# Patient Record
Sex: Female | Born: 1945
Health system: Southern US, Community
[De-identification: ages and names within clinical notes are randomized; demographics above are authoritative.]

## PROBLEM LIST (undated history)

## (undated) DIAGNOSIS — Z8489 Family history of other specified conditions: Secondary | ICD-10-CM

## (undated) DIAGNOSIS — H9191 Unspecified hearing loss, right ear: Secondary | ICD-10-CM

## (undated) DIAGNOSIS — E7800 Pure hypercholesterolemia, unspecified: Secondary | ICD-10-CM

## (undated) DIAGNOSIS — I495 Sick sinus syndrome: Secondary | ICD-10-CM

## (undated) DIAGNOSIS — R51 Headache: Secondary | ICD-10-CM

## (undated) DIAGNOSIS — R001 Bradycardia, unspecified: Secondary | ICD-10-CM

## (undated) DIAGNOSIS — I480 Paroxysmal atrial fibrillation: Secondary | ICD-10-CM

## (undated) DIAGNOSIS — Z950 Presence of cardiac pacemaker: Secondary | ICD-10-CM

## (undated) DIAGNOSIS — C4491 Basal cell carcinoma of skin, unspecified: Secondary | ICD-10-CM

## (undated) DIAGNOSIS — Z8619 Personal history of other infectious and parasitic diseases: Secondary | ICD-10-CM

## (undated) DIAGNOSIS — F419 Anxiety disorder, unspecified: Secondary | ICD-10-CM

## (undated) DIAGNOSIS — I10 Essential (primary) hypertension: Secondary | ICD-10-CM

## (undated) DIAGNOSIS — H9190 Unspecified hearing loss, unspecified ear: Secondary | ICD-10-CM

## (undated) DIAGNOSIS — Z8739 Personal history of other diseases of the musculoskeletal system and connective tissue: Secondary | ICD-10-CM

## (undated) DIAGNOSIS — S8262XA Displaced fracture of lateral malleolus of left fibula, initial encounter for closed fracture: Secondary | ICD-10-CM

## (undated) DIAGNOSIS — Z9889 Other specified postprocedural states: Secondary | ICD-10-CM

## (undated) DIAGNOSIS — Z7901 Long term (current) use of anticoagulants: Secondary | ICD-10-CM

## (undated) DIAGNOSIS — E039 Hypothyroidism, unspecified: Secondary | ICD-10-CM

## (undated) DIAGNOSIS — R112 Nausea with vomiting, unspecified: Secondary | ICD-10-CM

## (undated) DIAGNOSIS — K8010 Calculus of gallbladder with chronic cholecystitis without obstruction: Secondary | ICD-10-CM

## (undated) DIAGNOSIS — I639 Cerebral infarction, unspecified: Secondary | ICD-10-CM

## (undated) DIAGNOSIS — H269 Unspecified cataract: Secondary | ICD-10-CM

## (undated) DIAGNOSIS — Z8719 Personal history of other diseases of the digestive system: Secondary | ICD-10-CM

## (undated) DIAGNOSIS — J449 Chronic obstructive pulmonary disease, unspecified: Secondary | ICD-10-CM

## (undated) DIAGNOSIS — D333 Benign neoplasm of cranial nerves: Secondary | ICD-10-CM

## (undated) DIAGNOSIS — G459 Transient cerebral ischemic attack, unspecified: Secondary | ICD-10-CM

## (undated) DIAGNOSIS — T148XXA Other injury of unspecified body region, initial encounter: Secondary | ICD-10-CM

## (undated) DIAGNOSIS — K219 Gastro-esophageal reflux disease without esophagitis: Secondary | ICD-10-CM

## (undated) HISTORY — DX: Unspecified hearing loss, unspecified ear: H91.90

## (undated) HISTORY — DX: Bradycardia, unspecified: R00.1

## (undated) HISTORY — PX: ABDOMINAL HYSTERECTOMY: SHX81

## (undated) HISTORY — DX: Unspecified cataract: H26.9

## (undated) HISTORY — DX: Sick sinus syndrome: I49.5

## (undated) HISTORY — PX: BREAST EXCISIONAL BIOPSY: SUR124

## (undated) HISTORY — DX: Paroxysmal atrial fibrillation: I48.0

## (undated) HISTORY — PX: TONSILLECTOMY: SUR1361

## (undated) HISTORY — DX: Personal history of other diseases of the musculoskeletal system and connective tissue: Z87.39

## (undated) HISTORY — PX: APPENDECTOMY: SHX54

## (undated) HISTORY — PX: BREAST CYST ASPIRATION: SHX578

## (undated) HISTORY — PX: INTRAOCULAR LENS INSERTION: SHX110

## (undated) HISTORY — PX: BREAST BIOPSY: SHX20

## (undated) HISTORY — DX: Cerebral infarction, unspecified: I63.9

## (undated) HISTORY — DX: Displaced fracture of lateral malleolus of left fibula, initial encounter for closed fracture: S82.62XA

## (undated) HISTORY — DX: Benign neoplasm of cranial nerves: D33.3

## (undated) HISTORY — DX: Calculus of gallbladder with chronic cholecystitis without obstruction: K80.10

## (undated) HISTORY — DX: Transient cerebral ischemic attack, unspecified: G45.9

## (undated) HISTORY — DX: Long term (current) use of anticoagulants: Z79.01

---

## 1997-04-07 HISTORY — PX: CRANIECTOMY FOR EXCISION OF ACOUSTIC NEUROMA: SUR324

## 1999-01-25 ENCOUNTER — Encounter: Admission: RE | Admit: 1999-01-25 | Discharge: 1999-01-25 | Payer: Self-pay | Admitting: Family Medicine

## 1999-01-25 ENCOUNTER — Encounter: Payer: Self-pay | Admitting: Family Medicine

## 1999-10-18 ENCOUNTER — Encounter: Payer: Self-pay | Admitting: Family Medicine

## 1999-10-18 ENCOUNTER — Encounter: Admission: RE | Admit: 1999-10-18 | Discharge: 1999-10-18 | Payer: Self-pay | Admitting: Family Medicine

## 1999-12-02 ENCOUNTER — Encounter: Admission: RE | Admit: 1999-12-02 | Discharge: 2000-03-01 | Payer: Self-pay | Admitting: Neurosurgery

## 1999-12-24 ENCOUNTER — Encounter: Admission: RE | Admit: 1999-12-24 | Discharge: 1999-12-24 | Payer: Self-pay | Admitting: Family Medicine

## 1999-12-24 ENCOUNTER — Encounter: Payer: Self-pay | Admitting: Family Medicine

## 2000-05-13 ENCOUNTER — Encounter: Admission: RE | Admit: 2000-05-13 | Discharge: 2000-05-19 | Payer: Self-pay | Admitting: Family Medicine

## 2001-02-12 ENCOUNTER — Encounter: Admission: RE | Admit: 2001-02-12 | Discharge: 2001-02-12 | Payer: Self-pay | Admitting: Family Medicine

## 2001-02-12 ENCOUNTER — Encounter: Payer: Self-pay | Admitting: Family Medicine

## 2001-08-08 ENCOUNTER — Emergency Department (HOSPITAL_COMMUNITY): Admission: EM | Admit: 2001-08-08 | Discharge: 2001-08-08 | Payer: Self-pay | Admitting: Emergency Medicine

## 2001-08-08 ENCOUNTER — Encounter: Payer: Self-pay | Admitting: Emergency Medicine

## 2001-08-13 ENCOUNTER — Encounter: Admission: RE | Admit: 2001-08-13 | Discharge: 2001-08-13 | Payer: Self-pay

## 2001-10-06 ENCOUNTER — Encounter: Admission: RE | Admit: 2001-10-06 | Discharge: 2001-10-06 | Payer: Self-pay | Admitting: Family Medicine

## 2001-10-06 ENCOUNTER — Encounter: Payer: Self-pay | Admitting: Family Medicine

## 2002-03-23 ENCOUNTER — Encounter: Payer: Self-pay | Admitting: Family Medicine

## 2002-03-23 ENCOUNTER — Encounter: Admission: RE | Admit: 2002-03-23 | Discharge: 2002-03-23 | Payer: Self-pay | Admitting: Family Medicine

## 2002-03-28 ENCOUNTER — Encounter: Admission: RE | Admit: 2002-03-28 | Discharge: 2002-03-28 | Payer: Self-pay | Admitting: Family Medicine

## 2002-03-28 ENCOUNTER — Encounter: Payer: Self-pay | Admitting: Family Medicine

## 2002-07-04 ENCOUNTER — Emergency Department (HOSPITAL_COMMUNITY): Admission: EM | Admit: 2002-07-04 | Discharge: 2002-07-04 | Payer: Self-pay | Admitting: Emergency Medicine

## 2002-11-17 ENCOUNTER — Other Ambulatory Visit: Admission: RE | Admit: 2002-11-17 | Discharge: 2002-11-17 | Payer: Self-pay | Admitting: Family Medicine

## 2003-06-01 ENCOUNTER — Encounter: Admission: RE | Admit: 2003-06-01 | Discharge: 2003-06-01 | Payer: Self-pay | Admitting: Family Medicine

## 2003-12-04 ENCOUNTER — Encounter: Admission: RE | Admit: 2003-12-04 | Discharge: 2003-12-04 | Payer: Self-pay | Admitting: Family Medicine

## 2004-02-07 ENCOUNTER — Emergency Department (HOSPITAL_COMMUNITY): Admission: EM | Admit: 2004-02-07 | Discharge: 2004-02-07 | Payer: Self-pay | Admitting: Emergency Medicine

## 2004-02-23 ENCOUNTER — Encounter: Admission: RE | Admit: 2004-02-23 | Discharge: 2004-02-23 | Payer: Self-pay | Admitting: Family Medicine

## 2005-01-31 ENCOUNTER — Encounter: Admission: RE | Admit: 2005-01-31 | Discharge: 2005-01-31 | Payer: Self-pay | Admitting: Family Medicine

## 2005-04-28 ENCOUNTER — Encounter: Payer: Self-pay | Admitting: Family Medicine

## 2005-05-26 ENCOUNTER — Ambulatory Visit (HOSPITAL_COMMUNITY): Admission: RE | Admit: 2005-05-26 | Discharge: 2005-05-26 | Payer: Self-pay | Admitting: Interventional Cardiology

## 2006-05-05 ENCOUNTER — Encounter: Admission: RE | Admit: 2006-05-05 | Discharge: 2006-05-05 | Payer: Self-pay | Admitting: Family Medicine

## 2006-08-27 ENCOUNTER — Encounter: Payer: Self-pay | Admitting: Family Medicine

## 2006-10-02 ENCOUNTER — Encounter: Payer: Self-pay | Admitting: Family Medicine

## 2006-10-02 ENCOUNTER — Other Ambulatory Visit: Admission: RE | Admit: 2006-10-02 | Discharge: 2006-10-02 | Payer: Self-pay | Admitting: Family Medicine

## 2006-10-02 LAB — CONVERTED CEMR LAB: Pap Smear: NORMAL

## 2006-11-13 ENCOUNTER — Encounter: Admission: RE | Admit: 2006-11-13 | Discharge: 2006-11-13 | Payer: Self-pay | Admitting: Family Medicine

## 2006-11-13 ENCOUNTER — Encounter: Payer: Self-pay | Admitting: Family Medicine

## 2007-07-05 ENCOUNTER — Ambulatory Visit (HOSPITAL_COMMUNITY): Admission: RE | Admit: 2007-07-05 | Discharge: 2007-07-05 | Payer: Self-pay | Admitting: Family Medicine

## 2007-07-27 ENCOUNTER — Ambulatory Visit: Payer: Self-pay | Admitting: Vascular Surgery

## 2007-07-27 ENCOUNTER — Encounter: Payer: Self-pay | Admitting: Family Medicine

## 2007-12-18 ENCOUNTER — Encounter: Payer: Self-pay | Admitting: Family Medicine

## 2007-12-18 ENCOUNTER — Encounter: Admission: RE | Admit: 2007-12-18 | Discharge: 2007-12-18 | Payer: Self-pay | Admitting: Family Medicine

## 2008-01-19 ENCOUNTER — Encounter: Admission: RE | Admit: 2008-01-19 | Discharge: 2008-02-24 | Payer: Self-pay | Admitting: Family Medicine

## 2008-08-02 ENCOUNTER — Encounter: Admission: RE | Admit: 2008-08-02 | Discharge: 2008-08-02 | Payer: Self-pay | Admitting: Family Medicine

## 2008-09-29 ENCOUNTER — Encounter: Payer: Self-pay | Admitting: Family Medicine

## 2008-09-29 LAB — CONVERTED CEMR LAB
BUN: 13 mg/dL
Cholesterol: 269 mg/dL
Creatinine, Ser: 1.1 mg/dL
Direct LDL: 156 mg/dL
HDL: 79 mg/dL
Sodium: 130 meq/L
TSH: 3.94 microintl units/mL
Triglycerides: 156 mg/dL
Vit D, 1,25-Dihydroxy: 49.4

## 2009-04-07 DIAGNOSIS — G459 Transient cerebral ischemic attack, unspecified: Secondary | ICD-10-CM

## 2009-04-07 HISTORY — DX: Transient cerebral ischemic attack, unspecified: G45.9

## 2009-08-08 ENCOUNTER — Ambulatory Visit (HOSPITAL_COMMUNITY): Admission: RE | Admit: 2009-08-08 | Discharge: 2009-08-08 | Payer: Self-pay | Admitting: Family Medicine

## 2009-10-23 ENCOUNTER — Encounter: Payer: Self-pay | Admitting: Family Medicine

## 2009-10-23 ENCOUNTER — Ambulatory Visit: Payer: Self-pay | Admitting: Family Medicine

## 2009-10-23 DIAGNOSIS — F411 Generalized anxiety disorder: Secondary | ICD-10-CM | POA: Insufficient documentation

## 2009-10-23 DIAGNOSIS — E7800 Pure hypercholesterolemia, unspecified: Secondary | ICD-10-CM | POA: Insufficient documentation

## 2009-10-23 DIAGNOSIS — N959 Unspecified menopausal and perimenopausal disorder: Secondary | ICD-10-CM | POA: Insufficient documentation

## 2009-10-24 LAB — CONVERTED CEMR LAB
ALT: 33 units/L (ref 0–35)
AST: 26 units/L (ref 0–37)
Albumin: 3.9 g/dL (ref 3.5–5.2)
Alkaline Phosphatase: 72 units/L (ref 39–117)
BUN: 14 mg/dL (ref 6–23)
Bilirubin, Direct: 0.2 mg/dL (ref 0.0–0.3)
CO2: 31 meq/L (ref 19–32)
Calcium: 9.3 mg/dL (ref 8.4–10.5)
Chloride: 106 meq/L (ref 96–112)
Cholesterol: 280 mg/dL — ABNORMAL HIGH (ref 0–200)
Creatinine, Ser: 1 mg/dL (ref 0.4–1.2)
Direct LDL: 177.7 mg/dL
GFR calc non Af Amer: 58.56 mL/min (ref >60)
Glucose, Bld: 88 mg/dL (ref 70–99)
HDL: 73.1 mg/dL (ref >39.00)
Potassium: 4.5 meq/L (ref 3.5–5.1)
Sodium: 140 meq/L (ref 135–145)
Total Bilirubin: 0.4 mg/dL (ref 0.3–1.2)
Total CHOL/HDL Ratio: 4
Total Protein: 6.3 g/dL (ref 6.0–8.3)
Triglycerides: 148 mg/dL (ref 0.0–149.0)
VLDL: 29.6 mg/dL (ref 0.0–40.0)

## 2009-12-03 ENCOUNTER — Telehealth: Payer: Self-pay | Admitting: Family Medicine

## 2009-12-05 ENCOUNTER — Encounter: Payer: Self-pay | Admitting: Family Medicine

## 2009-12-05 DIAGNOSIS — I10 Essential (primary) hypertension: Secondary | ICD-10-CM | POA: Insufficient documentation

## 2010-02-03 ENCOUNTER — Emergency Department (HOSPITAL_COMMUNITY): Admission: EM | Admit: 2010-02-03 | Discharge: 2010-02-03 | Payer: Self-pay | Admitting: Emergency Medicine

## 2010-02-21 ENCOUNTER — Encounter: Admission: RE | Admit: 2010-02-21 | Discharge: 2010-02-21 | Payer: Self-pay | Admitting: Neurology

## 2010-04-27 ENCOUNTER — Encounter: Payer: Self-pay | Admitting: Family Medicine

## 2010-04-28 ENCOUNTER — Encounter: Payer: Self-pay | Admitting: Family Medicine

## 2010-05-07 NOTE — Miscellaneous (Signed)
  Clinical Lists Changes  Medications: Removed medication of VITAMIN E 600 UNIT  CAPS (VITAMIN E) Takes 3 capsules once daily Added new medication of FISH OIL   OIL (FISH OIL) Takes 3 per day

## 2010-05-07 NOTE — Assessment & Plan Note (Signed)
Summary: ?TRANSFER FROM [facility]   Vital Signs:  Patient profile:   65 year old female Height:      65 inches Weight:      121.75 pounds BMI:     20.33 Temp:     97.8 degrees F oral Pulse rate:   88 / minute Pulse rhythm:   regular BP sitting:   122 / 80  (left arm) Cuff size:   regular  Vitals Entered By: Delilah Shan CMA Kadyn Chovan Dull) (October 23, 2009 8:38 AM) CC: Transfer from [place] - [facility]   History of Present Illness: Prev [facility] patient.  Prev patient of Dr. Abigail Miyamoto.    CPE- see plan below along with prev med.   H/o Menopausal symptoms with hot flashes and extreme pain with intercourse. D/w patient JX:BJYNW of HRT.  She has had sig vaginal dryness that was only relieved by HRT.  She has never tried a taper and is interested.  I have d/w patient risks/benefits of HRT.    H/o HLD and due for recheck.  Intolerant of statins.  Taking fish oil.  Working on diet and exercise.   H/o AF followed by cards, Dr. Mendel Ryder with [facility].   "If I don't eat, every few hours, my sugar drops and I get nauseated.  If I eat a big meal, I feel even worse."  No vomiting, no BRBPR.   H/o acoustic neuroma, surgery and subsequent anxiety- had been on wellbutrin in the meantime to control anxiety.  Did well on 450mg  /day.  Dec in migraine frequency with this med.  Mood stable and outlook bright.   Preventive Screening-Counseling & Management  Alcohol-Tobacco     Smoking Status: never  Allergies (verified): 1)  ! Penicillin  Past History:  Past Medical History: AF HLD- intolerant of multiple statins, chol meds H/o acoustic neuroma- Deaf on R side H/o shingles Menopausal symptoms Allergic rhinitis Anxiety Migraines H/o UTI after intercourse, takes septra two times a day for 1 day.  H/o muscle spasms that respond to flexeril  Past Surgical History: 1999 acoustic neuroma on R breast biopsy x3, all negative 1982 Hysterectomy  Tonsillectomy in childhood  Family History: Reviewed  history and no changes required. parents: alcohol, arthritis, HLD, CAD, HTN, DM2 GPs: lung CA, CAD, CVA, HLD aunt with breast CA  Social History: Married second time 8. 3 kids. [employer], reading Secondary school teacher. Very rare alcohol Never Smoked Masters degreeSmoking Status:  never  Review of Systems       See HPI.  Otherwise noncontributory.    Physical Exam  General:  GEN: nad, alert and oriented HEENT: mucous membranes moist NECK: supple w/o LA CV: rrr.  no murmur PULM: ctab, no inc wob ABD: soft, +bs EXT: no edema SKIN: no acute rash  Breasts:  No mass, nodules, thickening, tenderness, bulging, retraction, inflamation, nipple discharge or skin changes noted.  chaperoned exam   Impression & Recommendations:  Problem # 1:  Preventive Health Care (ICD-V70.0) Last mammogram in 2011, pap not indicated. Will check outside records GN:FAOZHYQ vaccine.  Pt to check on zostavax. colonosocpy done in last 5 years. check lipids and glucose today.  continue healthy diet and exercise.  I think that the patient is getting symptoms of hypoglycemia. I want her to increase her protein intake to see if this helps with the symptoms described in the HPI.  she agrees.   Problem # 2:  ANXIETY STATE, UNSPECIFIED (ICD-300.00) Controlled, continue current meds.  Her updated medication list for this problem  includes:    Wellbutrin Xl 300 Mg Xr24h-tab (Bupropion hcl) .[name]... Take 1 tablet by mouth once a day along with a 150 mg. tablet    Wellbutrin Xl 150 Mg Xr24h-tab (Bupropion hcl) .[name]... Take 1 tablet by mouth once a day along with a 300 mg. tablet.  Problem # 3:  MENOPAUSAL DISORDER (ICD-627.9) Will taper HRT as allowed by symptoms.  Pt agrees with plan.  follow up as needed.  The following medications were removed from the medication list:    Cenestin 0.9 Mg Tabs (Estrogens conj synthetic a) .[name]... Take 1 tablet by mouth once a day Her updated medication list for this problem includes:    Cenestin  0.625 Mg Tabs (Estrogens conj synthetic a) .[name]... 1 by mouth qday  Problem # 4:  HYPERCHOLESTEROLEMIA (ICD-272.0) contact with labs.  Orders: TLB-Lipid Panel (80061-LIPID) TLB-BMP (Basic Metabolic Panel-BMET) (80048-METABOL) TLB-Hepatic/Liver Function Pnl (80076-HEPATIC)  Complete Medication List: 1)  Wellbutrin Xl 300 Mg Xr24h-tab (Bupropion hcl) .... Take 1 tablet by mouth once a day along with a 150 mg. tablet 2)  Wellbutrin Xl 150 Mg Xr24h-tab (Bupropion hcl) .... Take 1 tablet by mouth once a day along with a 300 mg. tablet. 3)  Aspirin 81 Mg Tabs (Aspirin) .... Takes 2 tablets once daily 4)  Vitamin E 600 Unit Caps (Vitamin e) .... Takes 3 capsules once daily 5)  Cenestin 0.625 Mg Tabs (Estrogens conj synthetic a) .[name].. 1 by mouth qday 6)  Septra Ds 800-160 Mg Tabs (Sulfamethoxazole-trimethoprim) .[name].. 1 by mouth two times a day as needed 7)  Flexeril 10 Mg Tabs (Cyclobenzaprine hcl) .[name].. 1 by mouth three times a day as needed pain with sedation caution  Patient Instructions: 1)  Check with your insurance to see if they will cover the shingles shot.  We'll contact you with your lab report.  2)  Please schedule a follow-up appointment as needed .  Let me know if the low blood sugar symptoms continue.  Prescriptions: FLEXERIL 10 MG TABS (CYCLOBENZAPRINE HCL) 1 by mouth three times a day as needed pain with sedation caution  #30 x 3   Entered and Authorized by:   Crawford Givens MD   Signed by:   Crawford Givens MD on 10/23/2009   Method used:   Electronically to        [facility]  [address] (retail)       [address]       [place], [place]  [address]       Ph: [phone]       Fax: [phone]   RxID:   [id number] SEPTRA DS 800-160 MG TABS (SULFAMETHOXAZOLE-TRIMETHOPRIM) 1 by mouth two times a day as needed  #20 x 1   Entered and Authorized by:   Crawford Givens MD   Signed by:   Crawford Givens MD on 10/23/2009   Method used:   Electronically to        [facility]   [address] [address] (retail)       [address]       [place], [place]  [address]       Ph: [phone]       Fax: [phone]   RxID:   [id number] WELLBUTRIN XL 150 MG XR24H-TAB (BUPROPION HCL) Take 1 tablet by mouth once a day along with a 300 mg. tablet.  #30 x 12   Entered and Authorized by:  Crawford Givens MD   Signed by:   Crawford Givens MD on 10/23/2009   Method used:   Electronically to        [facility] [address] (retail)       [address]       [place], [place]  [address]       Ph: [phone]       Fax: [phone]   RxID:   [id number] WELLBUTRIN XL 300 MG XR24H-TAB (BUPROPION HCL) Take 1 tablet by mouth once a day along with a 150 mg. tablet  #30 x 12   Entered and Authorized by:   Crawford Givens MD   Signed by:   Crawford Givens MD on 10/23/2009   Method used:   Electronically to        [facility]  [address] (retail)       [address]       [place], [place]  [address]       Ph: [phone]       Fax: [phone]   RxID:   [id number] CENESTIN 0.625 MG TABS (ESTROGENS CONJ SYNTHETIC A) 1 by mouth qday  #30 x 12   Entered and Authorized by:   Crawford Givens MD   Signed by:   Crawford Givens MD on 10/23/2009   Method used:   Electronically to        [facility]  [address] (retail)       [address]       [place], [place]  [address]       Ph: [phone]       Fax: [phone]   RxID:   [id number]   Current Allergies (reviewed today): ! PENICILLIN

## 2010-05-07 NOTE — Miscellaneous (Signed)
  Clinical Lists Changes  Problems: Added new problem of HYPERTENSION (ICD-401.9) Added new problem of PALPITATIONS (ICD-785.1) Observations: Added new observation of PAST MED HX: AF HLD- intolerant of multiple statins, chol meds H/o acoustic neuroma- Deaf on R side H/o shingles Menopausal symptoms- didn't tolerate taper of HRT in 2011 Allergic rhinitis Anxiety Migraines H/o UTI after intercourse, takes septra two times a day for 1 day.  H/o muscle spasms that respond to flexeril HYPERTENSION (ICD-401.9) HYPERCHOLESTEROLEMIA (ICD-272.0) ANXIETY STATE, UNSPECIFIED (ICD-300.00) MENOPAUSAL DISORDER (ICD-627.9) PALPITATIONS (ICD-785.1) due to PAC's and PVC's Cardiolite study was negative for ischemia but had exercise-induced PVC's 04/28/2005  Gated Cardiolite Exercise Stress Test  - Normal myocardial perfusion study without evidence of ischemia.  Normal wall motion study with EF of 76% (12/05/2009 15:44) Added new observation of MAMMOGRAM: normal (08/08/2009 15:56) Added new observation of FLU VAX: Fluvax 3+ (04/13/2009 15:52) Added new observation of VIT D 1,25OH: 49.4  (09/29/2008 15:44) Added new observation of TSH: 3.94 microintl units/mL (09/29/2008 15:44) Added new observation of CREATININE: 1.10 mg/dL (82/95/6213 08:65) Added new observation of BUN: 13 mg/dL (78/46/9629 52:84) Added new observation of NA: 130 meq/L (09/29/2008 15:44) Added new observation of LDL DIR: 156 mg/dL (13/24/4010 27:25) Added new observation of HDL: 79 mg/dL (36/64/4034 74:25) Added new observation of TRIGLYC TOT: 156 mg/dL (95/63/8756 43:32) Added new observation of CHOLESTEROL: 269 mg/dL (95/18/8416 60:63) Added new observation of PAP SMEAR: normal  (10/02/2006 15:59) Added new observation of TD BOOSTER: Td  (04/07/2005 15:51)      Past History:  Past Medical History: AF HLD- intolerant of multiple statins, chol meds H/o acoustic neuroma- Deaf on R side H/o shingles Menopausal symptoms-  didn't tolerate taper of HRT in 2011 Allergic rhinitis Anxiety Migraines H/o UTI after intercourse, takes septra two times a day for 1 day.  H/o muscle spasms that respond to flexeril HYPERTENSION (ICD-401.9) HYPERCHOLESTEROLEMIA (ICD-272.0) ANXIETY STATE, UNSPECIFIED (ICD-300.00) MENOPAUSAL DISORDER (ICD-627.9) PALPITATIONS (ICD-785.1) due to PAC's and PVC's Cardiolite study was negative for ischemia but had exercise-induced PVC's 04/28/2005  Gated Cardiolite Exercise Stress Test  - Normal myocardial perfusion study without evidence of ischemia.  Normal wall motion study with EF of 76%     Preventive Care Screening  Mammogram:    Date:  08/08/2009    Results:  normal  Pap Smear:    Date:  10/02/2006    Results:  normal   Last Tetanus Booster:    Date:  04/07/2005    Results:  Td     Immunization History:  Influenza Immunization History:    Influenza:  fluvax 3+ (04/13/2009)

## 2010-05-07 NOTE — Letter (Signed)
Summary: [facility]  [facility]   Imported By: Sherian Rein 12/13/2009 09:10:48  _____________________________________________________________________  External Attachment:    Type:   Image     Comment:   External Document

## 2010-05-07 NOTE — Progress Notes (Signed)
Summary: [name]   Phone Note Call from Patient Call back at Home Phone [phone]   Caller: Patient Call For: Crawford Givens MD Summary of Call: Patient states that she was taking 0.9 of the [name] and it you had changed it to 0.625 to see if she would do okay w/ the lowe dose. She says that she really needs to 0.9  and is asking if she could [facility] [place].  Initial call taken by: Melody Comas,  December 03, 2009 2:16 PM  Follow-up for Phone Call        done.  Follow-up by: Crawford Givens MD,  December 03, 2009 4:23 PM    New/Updated Medications: [name] 0.9 MG TABS (ESTROGENS CONJ SYNTHETIC A) 1 by mouth qday Prescriptions: [name] 0.9 MG TABS (ESTROGENS CONJ SYNTHETIC A) 1 by mouth qday  #90 x 3   Entered and Authorized by:   Crawford Givens MD   Signed by:   Crawford Givens MD on 12/03/2009   Method used:   Electronically to        [facility]  [address] [address] (retail)       [address]       [place], [place]  [address]       Ph: [phone]       Fax: [phone]   RxID:   [id number]    Past History:  Past Medical History: AF HLD- intolerant of multiple statins, chol meds H/o acoustic neuroma- Deaf on R side H/o shingles Menopausal symptoms- didn't tolerate taper of HRT in 2011 Allergic rhinitis Anxiety Migraines H/o UTI after intercourse, takes septra two times a day for 1 day.  H/o muscle spasms that respond to flexeril

## 2010-06-19 LAB — DIFFERENTIAL
Basophils Absolute: 0 10*3/uL (ref 0.0–0.1)
Basophils Relative: 0 % (ref 0–1)
Eosinophils Absolute: 0.3 10*3/uL (ref 0.0–0.7)
Eosinophils Relative: 4 % (ref 0–5)
Lymphocytes Relative: 13 % (ref 12–46)
Lymphs Abs: 1.1 10*3/uL (ref 0.7–4.0)
Monocytes Absolute: 0.4 10*3/uL (ref 0.1–1.0)
Monocytes Relative: 5 % (ref 3–12)
Neutro Abs: 6.2 10*3/uL (ref 1.7–7.7)
Neutrophils Relative %: 78 % — ABNORMAL HIGH (ref 43–77)

## 2010-06-19 LAB — CBC
HCT: 43.3 % (ref 36.0–46.0)
Hemoglobin: 14.6 g/dL (ref 12.0–15.0)
MCH: 28.4 pg (ref 26.0–34.0)
MCHC: 33.8 g/dL (ref 30.0–36.0)
MCV: 84.1 fL (ref 78.0–100.0)
Platelets: 270 10*3/uL (ref 150–400)
RBC: 5.15 MIL/uL — ABNORMAL HIGH (ref 3.87–5.11)
RDW: 12.9 % (ref 11.5–15.5)
WBC: 8 10*3/uL (ref 4.0–10.5)

## 2010-06-19 LAB — URINALYSIS, ROUTINE W REFLEX MICROSCOPIC
Bilirubin Urine: NEGATIVE
Glucose, UA: NEGATIVE mg/dL
Hgb urine dipstick: NEGATIVE
Ketones, ur: NEGATIVE mg/dL
Nitrite: NEGATIVE
Protein, ur: NEGATIVE mg/dL
Specific Gravity, Urine: 1.007 (ref 1.005–1.030)
Urobilinogen, UA: 0.2 mg/dL (ref 0.0–1.0)
pH: 7.5 (ref 5.0–8.0)

## 2010-06-19 LAB — POCT I-STAT, CHEM 8
Chloride: 104 mEq/L (ref 96–112)
Creatinine, Ser: 1.1 mg/dL (ref 0.4–1.2)
Glucose, Bld: 118 mg/dL — ABNORMAL HIGH (ref 70–99)
HCT: 46 % (ref 36.0–46.0)
Potassium: 4.6 mEq/L (ref 3.5–5.1)

## 2010-06-19 LAB — POCT CARDIAC MARKERS
CKMB, poc: 2 ng/mL (ref 1.0–8.0)
Troponin i, poc: <0.05 ng/mL (ref 0.00–0.09)

## 2010-06-19 LAB — GLUCOSE, CAPILLARY: Glucose-Capillary: 116 mg/dL — ABNORMAL HIGH (ref 70–99)

## 2010-06-19 LAB — D-DIMER, QUANTITATIVE: D-Dimer, Quant: 0.3 ug/mL-FEU (ref 0.00–0.48)

## 2010-08-13 ENCOUNTER — Other Ambulatory Visit (HOSPITAL_COMMUNITY): Payer: Self-pay | Admitting: Family Medicine

## 2010-08-13 DIAGNOSIS — Z1231 Encounter for screening mammogram for malignant neoplasm of breast: Secondary | ICD-10-CM

## 2010-08-20 NOTE — Procedures (Signed)
LOWER EXTREMITY ARTERIAL EVALUATION-SINGLE LEVEL   INDICATION:  Left leg cramping at night, which is relieved with motion  of the left leg.   HISTORY:  Diabetes:  No.  Cardiac:  Atrial fibrillation.  Hypertension:  Medically controlled.  Smoking:  No.  Previous Surgery:  No.   RESTING SYSTOLIC PRESSURES: (ABI)                          RIGHT                LEFT  Brachial:               108                  106  Anterior tibial:        118                  120  Posterior tibial:       128 (>1.0)           126 (>1.0)  Peroneal:  DOPPLER WAVEFORM ANALYSIS:  Anterior tibial:        Biphasic             Biphasic  Posterior tibial:       Biphasic             Biphasic  Peroneal:   PREVIOUS ABI'S:  Date:  RIGHT:  LEFT:   IMPRESSION:  Findings suggest no significant arterial occlusive disease  bilaterally.   ___________________________________________  Quita Skye Hart Rochester, M.D.   MC/MEDQ  D:  07/27/2007  T:  07/27/2007  Job:  [id number]

## 2010-08-20 NOTE — Consult Note (Signed)
VASCULAR SURGERY CONSULTATION   [name], [name]  DOB:  1945-12-10                                       07/27/2007  JXBJY#:[id number]   The patient was referred for vascular evaluation by Dr. Abigail Miyamoto for leg  discomfort.  This 64 year old healthy female states that she has  cramping in her left calf at night, which is frequent at times and then  she will go several days without it.  This does not happened during the  daytime and is not associated with walking or ambulation.  She states  that it is more severe when her left leg is straight as opposed to  flexed.  She has no history of deep venous thrombosis, thrombophlebitis,  pulmonary emboli, ischemic ulcerations, venous stasis ulcers, or  varicose veins.   PAST MEDICAL HISTORY:  Hypertension, hyperlipidemia, negative for  diabetes, coronary artery disease, CVA, COPD.  She does have a history  atrial fibrillation.   PAST SURGICAL HISTORY:  1. Removal on an acoustic neuroma.  2. Hysterectomy.   FAMILY HISTORY:  Negative for coronary artery disease and diabetes.  Positive for stroke in a grandmother.   SOCIAL HISTORY:  She is married, has three children.  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   REVIEW OF SYSTEMS:  Unremarkable with the exception of her atrial  fibrillation and her leg cramping at night.  Otherwise being totally  free of symptomatology.  Allergies to penicillin.   MEDICATIONS:  Please see health history form.   PHYSICAL EXAM:  Blood pressure 107/73, heart rate 70, respirations 14.  General:  She is a middle-aged female in no apparent distress, alert and  oriented x3.  Neck:  Supple 3+ carotid pulses palpable.  No bruits are  audible.  Neurologic:  Normal.  No palpable adenopathy in the neck.  Chest:  Clear to auscultation.  Cardiovascular:  Regular rhythm with no  murmurs.  Is soft, nontender with no palpable masses.  3+ femoral  popliteal, dorsalis pedis and  posterior tibial pulses palpable  bilaterally.  No edema is noted.  Both feet are well-perfused.  There  are a few prominent veins in the left leg, in the medial calf and medial  thigh but no significant varicosities or spider veins.  No  hyperpigmentation or ulcerations noted.   Lower extremity arterial study was normal with normal ABIs and triphasic  waveforms in both feet.   I reassured her arterial and venous systems look quite good and that her  symptoms are unrelated to this.  Cramping may be the typical nocturnal  cramps which could respond to some quinine or potassium supplements may  be of help.  She may continue to have these regardless of the treatment.  She questioned whether her anti-lipid drug (Crestor) could be  responsible, and the only way I think that to be determined would be to  discontinue it for a period of time and see what happens her symptoms.  If I can be of further assistance, please let me know.   Quita Skye Hart Rochester, M.D.  Electronically Signed  JDL/MEDQ  D:  07/27/2007  T:  07/28/2007  Job:  [id number]   cc:   Chales Salmon. Abigail Miyamoto, M.D.

## 2010-09-11 ENCOUNTER — Ambulatory Visit (HOSPITAL_COMMUNITY): Payer: Medicare Other

## 2010-09-18 ENCOUNTER — Ambulatory Visit (HOSPITAL_COMMUNITY): Payer: Medicare Other

## 2010-10-01 ENCOUNTER — Ambulatory Visit (HOSPITAL_COMMUNITY)
Admission: RE | Admit: 2010-10-01 | Discharge: 2010-10-01 | Disposition: A | Discharge status: Home / Self Care | Payer: Medicare Other | Source: Ambulatory Visit | Attending: Family Medicine | Admitting: Family Medicine

## 2010-10-01 DIAGNOSIS — Z1231 Encounter for screening mammogram for malignant neoplasm of breast: Secondary | ICD-10-CM | POA: Insufficient documentation

## 2011-01-19 ENCOUNTER — Emergency Department (HOSPITAL_COMMUNITY): Payer: Medicare Other

## 2011-01-19 ENCOUNTER — Emergency Department (HOSPITAL_COMMUNITY)
Admission: EM | Admit: 2011-01-19 | Discharge: 2011-01-19 | Disposition: A | Discharge status: Home / Self Care | Payer: Medicare Other | Attending: Emergency Medicine | Admitting: Emergency Medicine

## 2011-01-19 DIAGNOSIS — Z86011 Personal history of benign neoplasm of the brain: Secondary | ICD-10-CM | POA: Insufficient documentation

## 2011-01-19 DIAGNOSIS — R42 Dizziness and giddiness: Secondary | ICD-10-CM | POA: Insufficient documentation

## 2011-01-19 DIAGNOSIS — Z7901 Long term (current) use of anticoagulants: Secondary | ICD-10-CM | POA: Insufficient documentation

## 2011-01-19 DIAGNOSIS — K219 Gastro-esophageal reflux disease without esophagitis: Secondary | ICD-10-CM | POA: Insufficient documentation

## 2011-01-19 DIAGNOSIS — R4182 Altered mental status, unspecified: Secondary | ICD-10-CM | POA: Insufficient documentation

## 2011-01-19 DIAGNOSIS — I10 Essential (primary) hypertension: Secondary | ICD-10-CM | POA: Insufficient documentation

## 2011-01-19 DIAGNOSIS — I4891 Unspecified atrial fibrillation: Secondary | ICD-10-CM | POA: Insufficient documentation

## 2011-01-19 DIAGNOSIS — Z79899 Other long term (current) drug therapy: Secondary | ICD-10-CM | POA: Insufficient documentation

## 2011-01-19 DIAGNOSIS — N390 Urinary tract infection, site not specified: Secondary | ICD-10-CM | POA: Insufficient documentation

## 2011-01-19 LAB — CBC
MCH: 27 pg (ref 26.0–34.0)
MCHC: 32.4 g/dL (ref 30.0–36.0)
Platelets: 241 10*3/uL (ref 150–400)
RBC: 4.52 MIL/uL (ref 3.87–5.11)
RDW: 13.7 % (ref 11.5–15.5)

## 2011-01-19 LAB — COMPREHENSIVE METABOLIC PANEL
ALT: 25 U/L (ref 0–35)
AST: 26 U/L (ref 0–37)
Albumin: 3.7 g/dL (ref 3.5–5.2)
Calcium: 9.4 mg/dL (ref 8.4–10.5)
Sodium: 141 mEq/L (ref 135–145)
Total Protein: 6.7 g/dL (ref 6.0–8.3)

## 2011-01-19 LAB — DIFFERENTIAL
Basophils Relative: 0 % (ref 0–1)
Eosinophils Absolute: 0.1 10*3/uL (ref 0.0–0.7)
Monocytes Relative: 8 % (ref 3–12)
Neutrophils Relative %: 72 % (ref 43–77)

## 2011-01-19 LAB — URINALYSIS, ROUTINE W REFLEX MICROSCOPIC
Bilirubin Urine: NEGATIVE
Hgb urine dipstick: NEGATIVE
Specific Gravity, Urine: 1.007 (ref 1.005–1.030)
pH: 7 (ref 5.0–8.0)

## 2011-01-22 LAB — URINE CULTURE: Colony Count: 40000

## 2011-03-08 HISTORY — PX: MOHS SURGERY: SUR867

## 2011-04-09 ENCOUNTER — Other Ambulatory Visit: Payer: Self-pay | Admitting: Neurosurgery

## 2011-04-09 DIAGNOSIS — M542 Cervicalgia: Secondary | ICD-10-CM

## 2011-04-10 DIAGNOSIS — I10 Essential (primary) hypertension: Secondary | ICD-10-CM | POA: Diagnosis not present

## 2011-04-10 DIAGNOSIS — R5381 Other malaise: Secondary | ICD-10-CM | POA: Diagnosis not present

## 2011-04-10 DIAGNOSIS — N952 Postmenopausal atrophic vaginitis: Secondary | ICD-10-CM | POA: Diagnosis not present

## 2011-04-10 DIAGNOSIS — F411 Generalized anxiety disorder: Secondary | ICD-10-CM | POA: Diagnosis not present

## 2011-04-10 DIAGNOSIS — E785 Hyperlipidemia, unspecified: Secondary | ICD-10-CM | POA: Diagnosis not present

## 2011-04-10 DIAGNOSIS — IMO0001 Reserved for inherently not codable concepts without codable children: Secondary | ICD-10-CM | POA: Diagnosis not present

## 2011-04-11 DIAGNOSIS — I4891 Unspecified atrial fibrillation: Secondary | ICD-10-CM | POA: Diagnosis not present

## 2011-04-16 ENCOUNTER — Other Ambulatory Visit: Payer: Medicare HMO

## 2011-04-24 DIAGNOSIS — I4891 Unspecified atrial fibrillation: Secondary | ICD-10-CM | POA: Diagnosis not present

## 2011-04-24 DIAGNOSIS — Z7901 Long term (current) use of anticoagulants: Secondary | ICD-10-CM | POA: Diagnosis not present

## 2011-04-25 DIAGNOSIS — H9209 Otalgia, unspecified ear: Secondary | ICD-10-CM | POA: Diagnosis not present

## 2011-05-08 DIAGNOSIS — I4891 Unspecified atrial fibrillation: Secondary | ICD-10-CM | POA: Diagnosis not present

## 2011-05-16 DIAGNOSIS — H6980 Other specified disorders of Eustachian tube, unspecified ear: Secondary | ICD-10-CM | POA: Diagnosis not present

## 2011-05-16 DIAGNOSIS — J0120 Acute ethmoidal sinusitis, unspecified: Secondary | ICD-10-CM | POA: Diagnosis not present

## 2011-05-22 DIAGNOSIS — I4891 Unspecified atrial fibrillation: Secondary | ICD-10-CM | POA: Diagnosis not present

## 2011-06-01 ENCOUNTER — Other Ambulatory Visit: Payer: Self-pay

## 2011-06-01 ENCOUNTER — Encounter (HOSPITAL_COMMUNITY): Payer: Self-pay | Admitting: *Deleted

## 2011-06-01 ENCOUNTER — Emergency Department (HOSPITAL_COMMUNITY)
Admission: EM | Admit: 2011-06-01 | Discharge: 2011-06-02 | Disposition: A | Discharge status: Home / Self Care | Payer: Medicare Other | Attending: Emergency Medicine | Admitting: Emergency Medicine

## 2011-06-01 DIAGNOSIS — R0789 Other chest pain: Secondary | ICD-10-CM | POA: Diagnosis not present

## 2011-06-01 DIAGNOSIS — R079 Chest pain, unspecified: Secondary | ICD-10-CM | POA: Diagnosis present

## 2011-06-01 DIAGNOSIS — I10 Essential (primary) hypertension: Secondary | ICD-10-CM | POA: Diagnosis present

## 2011-06-01 DIAGNOSIS — I480 Paroxysmal atrial fibrillation: Secondary | ICD-10-CM | POA: Diagnosis present

## 2011-06-01 DIAGNOSIS — R110 Nausea: Secondary | ICD-10-CM | POA: Diagnosis not present

## 2011-06-01 HISTORY — DX: Unspecified hearing loss, right ear: H91.91

## 2011-06-01 HISTORY — DX: Personal history of other infectious and parasitic diseases: Z86.19

## 2011-06-01 HISTORY — DX: Headache: R51

## 2011-06-01 HISTORY — DX: Pure hypercholesterolemia, unspecified: E78.00

## 2011-06-01 HISTORY — DX: Essential (primary) hypertension: I10

## 2011-06-01 HISTORY — DX: Anxiety disorder, unspecified: F41.9

## 2011-06-01 HISTORY — DX: Basal cell carcinoma of skin, unspecified: C44.91

## 2011-06-01 NOTE — ED Notes (Signed)
Pt reports chest tightness off and on for weeks and has an appointment with cardiologist on Wednesday, but reports tonight chest pain is constant and not going away.  Pt reports this episode started about a half an hour ago.  Pt has history of afib. Also complaining of nausea and left arm pain.

## 2011-06-02 ENCOUNTER — Emergency Department (HOSPITAL_COMMUNITY): Payer: Medicare Other

## 2011-06-02 ENCOUNTER — Encounter (HOSPITAL_COMMUNITY): Payer: Self-pay | Admitting: Internal Medicine

## 2011-06-02 DIAGNOSIS — R110 Nausea: Secondary | ICD-10-CM | POA: Diagnosis not present

## 2011-06-02 DIAGNOSIS — R079 Chest pain, unspecified: Secondary | ICD-10-CM | POA: Diagnosis present

## 2011-06-02 DIAGNOSIS — R5381 Other malaise: Secondary | ICD-10-CM | POA: Diagnosis not present

## 2011-06-02 DIAGNOSIS — I10 Essential (primary) hypertension: Secondary | ICD-10-CM | POA: Diagnosis not present

## 2011-06-02 DIAGNOSIS — I480 Paroxysmal atrial fibrillation: Secondary | ICD-10-CM | POA: Diagnosis present

## 2011-06-02 DIAGNOSIS — E782 Mixed hyperlipidemia: Secondary | ICD-10-CM | POA: Diagnosis not present

## 2011-06-02 DIAGNOSIS — N390 Urinary tract infection, site not specified: Secondary | ICD-10-CM | POA: Diagnosis not present

## 2011-06-02 DIAGNOSIS — R509 Fever, unspecified: Secondary | ICD-10-CM | POA: Diagnosis not present

## 2011-06-02 DIAGNOSIS — I4891 Unspecified atrial fibrillation: Secondary | ICD-10-CM | POA: Diagnosis not present

## 2011-06-02 DIAGNOSIS — E860 Dehydration: Secondary | ICD-10-CM | POA: Diagnosis not present

## 2011-06-02 LAB — CBC
HCT: 39.2 % (ref 36.0–46.0)
MCH: 26.8 pg (ref 26.0–34.0)
MCV: 82.7 fL (ref 78.0–100.0)
Platelets: 279 10*3/uL (ref 150–400)
RBC: 4.74 MIL/uL (ref 3.87–5.11)
RDW: 13.7 % (ref 11.5–15.5)

## 2011-06-02 LAB — COMPREHENSIVE METABOLIC PANEL
ALT: 26 U/L (ref 0–35)
Albumin: 4.1 g/dL (ref 3.5–5.2)
Alkaline Phosphatase: 62 U/L (ref 39–117)
BUN: 13 mg/dL (ref 6–23)
CO2: 30 mEq/L (ref 19–32)
Creatinine, Ser: 0.91 mg/dL (ref 0.50–1.10)
GFR calc Af Amer: 75 mL/min — ABNORMAL LOW (ref >90)
Total Bilirubin: 0.3 mg/dL (ref 0.3–1.2)
Total Protein: 6.9 g/dL (ref 6.0–8.3)

## 2011-06-02 LAB — CARDIAC PANEL(CRET KIN+CKTOT+MB+TROPI)
Troponin I: <0.3 ng/mL (ref <0.30)
Troponin I: <0.3 ng/mL (ref <0.30)

## 2011-06-02 MED ORDER — ASPIRIN 81 MG PO CHEW
162.0000 mg | CHEWABLE_TABLET | Freq: Once | ORAL | Status: AC
  Administered 2011-06-02: 162 mg via ORAL
  Filled 2011-06-02: qty 2

## 2011-06-02 MED ORDER — ALUM & MAG HYDROXIDE-SIMETH 200-200-20 MG/5ML PO SUSP: 30.0000 mL | Freq: Four times a day (QID) | ORAL | Status: DC | PRN

## 2011-06-02 MED ORDER — ASPIRIN 325 MG PO TABS: 325.0000 mg | ORAL_TABLET | Freq: Every day | ORAL | Status: DC

## 2011-06-02 MED ORDER — NITROGLYCERIN 0.4 MG SL SUBL
0.4000 mg | SUBLINGUAL_TABLET | SUBLINGUAL | Status: DC | PRN
  Filled 2011-06-02: qty 25

## 2011-06-02 MED ORDER — OMEPRAZOLE 20 MG PO CPDR: 40.0000 mg | DELAYED_RELEASE_CAPSULE | Freq: Every day | ORAL | Status: DC

## 2011-06-02 MED ORDER — ONDANSETRON HCL 4 MG/2ML IJ SOLN: 4.0000 mg | Freq: Four times a day (QID) | INTRAMUSCULAR | Status: DC | PRN

## 2011-06-02 MED ORDER — SODIUM CHLORIDE 0.9 % IJ SOLN
3.0000 mL | Freq: Two times a day (BID) | INTRAMUSCULAR | Status: DC
  Administered 2011-06-02: 3 mL via INTRAVENOUS

## 2011-06-02 MED ORDER — ACETAMINOPHEN 325 MG PO TABS
ORAL_TABLET | ORAL | Status: AC
  Filled 2011-06-02: qty 2

## 2011-06-02 MED ORDER — ASPIRIN 325 MG PO TABS
325.0000 mg | ORAL_TABLET | Freq: Every day | ORAL | Status: DC
  Administered 2011-06-02: 325 mg via ORAL
  Filled 2011-06-02 (×2): qty 1

## 2011-06-02 MED ORDER — ZOLPIDEM TARTRATE 5 MG PO TABS: 5.0000 mg | ORAL_TABLET | Freq: Every evening | ORAL | Status: DC | PRN

## 2011-06-02 MED ORDER — ONDANSETRON HCL 4 MG PO TABS
4.0000 mg | ORAL_TABLET | Freq: Four times a day (QID) | ORAL | Status: DC | PRN
  Administered 2011-06-02: 4 mg via ORAL
  Filled 2011-06-02: qty 1

## 2011-06-02 MED ORDER — NITROGLYCERIN 2 % TD OINT
1.0000 [in_us] | TOPICAL_OINTMENT | Freq: Four times a day (QID) | TRANSDERMAL | Status: DC
  Administered 2011-06-02 (×2): 1 [in_us] via TOPICAL
  Filled 2011-06-02: qty 30

## 2011-06-02 MED ORDER — ACETAMINOPHEN 325 MG PO TABS
650.0000 mg | ORAL_TABLET | Freq: Four times a day (QID) | ORAL | Status: DC | PRN
  Administered 2011-06-02: 650 mg via ORAL

## 2011-06-02 MED ORDER — OXYCODONE HCL 5 MG PO TABS
5.0000 mg | ORAL_TABLET | ORAL | Status: DC | PRN
  Administered 2011-06-02: 5 mg via ORAL
  Filled 2011-06-02: qty 1

## 2011-06-02 MED ORDER — ACETAMINOPHEN 650 MG RE SUPP: 650.0000 mg | Freq: Four times a day (QID) | RECTAL | Status: DC | PRN

## 2011-06-02 MED ORDER — HYDROMORPHONE HCL PF 1 MG/ML IJ SOLN: 0.5000 mg | INTRAMUSCULAR | Status: DC | PRN

## 2011-06-02 MED ORDER — WARFARIN SODIUM 5 MG PO TABS
5.0000 mg | ORAL_TABLET | ORAL | Status: DC
  Filled 2011-06-02: qty 1

## 2011-06-02 MED ORDER — WARFARIN SODIUM 2.5 MG PO TABS
2.5000 mg | ORAL_TABLET | ORAL | Status: DC
  Filled 2011-06-02: qty 1

## 2011-06-02 NOTE — Progress Notes (Signed)
ANTICOAGULATION CONSULT NOTE - Initial Consult  Pharmacy Consult for warfarin Indication: atrial fibrillation  Allergies  Allergen Reactions  . Atorvastatin     REACTION: intolerant of multiple statins  . Penicillins     REACTION: Rash    Patient Measurements:   Heparin Dosing Weight:   Vital Signs: Temp: 97.6 F (36.4 C) (02/24 2345) Temp src: Oral (02/24 2345) BP: 147/80 mmHg (02/25 0116) Pulse Rate: 73  (02/24 2345)  Labs:  Basename 06/02/11 0057  HGB 12.7  HCT 39.2  PLT 279  APTT --  LABPROT 24.2*  INR 2.13*  HEPARINUNFRC --  CREATININE 0.91  CKTOTAL --  CKMB --  TROPONINI <0.30   The CrCl is unknown because both a height and weight (above a minimum accepted value) are required for this calculation.  Medical History: Past Medical History  Diagnosis Date  . Hypertension   . Hypercholesteremia   . Basal cell carcinoma   . CAD (coronary artery disease)   . Atrial fibrillation     Medications:  Warfarin: 5mg  every day except on mondays patient take 2.5mg    Assessment: Patient on chronic warfarin for afib.  INR at goal.  Home dose known. Goal of Therapy:  INR 2-3   Plan:  Will continue with home dose and check INR daily  Aleene Davidson Crowford 06/02/2011,5:48 AM

## 2011-06-02 NOTE — ED Notes (Signed)
MD at bedside. 

## 2011-06-02 NOTE — ED Provider Notes (Signed)
History     CSN: [id number]  Arrival date & time 06/01/11  2333   First MD Initiated Contact with Patient 06/02/11 0003      Chief Complaint  Patient presents with  . Chest Pain  . Nausea    (Consider location/radiation/quality/duration/timing/severity/associated sxs/prior treatment) Patient is a 66 y.o. female presenting with chest pain. The history is provided by the patient.  Chest Pain The chest pain began 3 - 5 hours ago. Duration of episode(s) is 2 hours. Chest pain occurs constantly. The chest pain is unchanged. Associated with: Nothing  The pain is currently at 5/10. The severity of the pain is moderate. The quality of the pain is described as tightness. The pain radiates to the left shoulder and left arm. Exacerbated by: Seems to get worse when she lays down flat. Primary symptoms include nausea. Pertinent negatives for primary symptoms include no fever, no fatigue, no syncope, no shortness of breath, no cough, no wheezing, no palpitations, no abdominal pain, no vomiting, no dizziness and no altered mental status.  Pertinent negatives for associated symptoms include no diaphoresis, no lower extremity edema, no near-syncope and no orthopnea. She tried nothing for the symptoms. Risk factors: She has been treated for hypertension and recently taken off of lisinopril. She treated for high cholesterol.  Pertinent negatives for past medical history include no CAD, no cancer, no COPD, no CHF, no diabetes, no DVT and no PE.     Past Medical History  Diagnosis Date  . Hypertension   . Hypercholesteremia   . Basal cell carcinoma     Past Surgical History  Procedure Date  . Tonsillectomy   . Abdominal hysterectomy   . Craniectomy for excision of acoustic neuroma     No family history on file.  History  Substance Use Topics  . Smoking status: Former Games developer  . Smokeless tobacco: Not on file  . Alcohol Use: No    OB History    Grav Para Term Preterm Abortions TAB SAB Ect  Mult Living                  Review of Systems  Constitutional: Negative for fever, chills, diaphoresis and fatigue.  HENT: Negative for neck pain and neck stiffness.   Eyes: Negative for pain.  Respiratory: Negative for cough, shortness of breath and wheezing.   Cardiovascular: Negative for chest pain, palpitations, orthopnea, syncope and near-syncope.  Gastrointestinal: Positive for nausea. Negative for vomiting and abdominal pain.  Genitourinary: Negative for dysuria.  Musculoskeletal: Negative for back pain.  Skin: Negative for rash.  Neurological: Negative for dizziness and headaches.  Psychiatric/Behavioral: Negative for altered mental status.  All other systems reviewed and are negative.    Allergies  Atorvastatin and Penicillins  Home Medications  No current outpatient prescriptions on file.  BP 129/87  Pulse 73  Temp(Src) 97.6 F (36.4 C) (Oral)  Resp 18  SpO2 100%  Physical Exam  Constitutional: She is oriented to person, place, and time. She appears well-developed and well-nourished.  HENT:  Head: Normocephalic and atraumatic.  Eyes: Conjunctivae and EOM are normal. Pupils are equal, round, and reactive to light.  Neck: Trachea normal. Neck supple. No thyromegaly present.  Cardiovascular: Normal rate, regular rhythm, S1 normal, S2 normal and normal pulses.     No systolic murmur is present   No diastolic murmur is present  Pulses:      Radial pulses are 2+ on the right side, and 2+ on the left side.  Pulmonary/Chest:  Effort normal and breath sounds normal. She has no wheezes. She has no rhonchi. She has no rales. She exhibits no tenderness.  Abdominal: Soft. Normal appearance and bowel sounds are normal. There is no tenderness. There is no CVA tenderness and negative Murphy's sign.  Musculoskeletal:       BLE:s Calves nontender, no cords or erythema, negative Homans sign  Neurological: She is alert and oriented to person, place, and time. She has normal  strength. No cranial nerve deficit or sensory deficit. GCS eye subscore is 4. GCS verbal subscore is 5. GCS motor subscore is 6.  Skin: Skin is warm and dry. No rash noted. She is not diaphoretic.  Psychiatric: Her speech is normal.       Cooperative and appropriate    ED Course  Procedures (including critical care time)   Labs Reviewed  CBC  COMPREHENSIVE METABOLIC PANEL  TROPONIN I  PROTIME-INR    Date: 06/02/2011  Rate: 71  Rhythm: normal sinus rhythm  QRS Axis: normal  Intervals: normal  ST/T Wave abnormalities: nonspecific ST changes  Conduction Disutrbances:none  Narrative Interpretation: Old mild ST depressions inferior and lateral leads  Old EKG Reviewed: unchanged    Case discussed as above with Dr. Lovell Sheehan on call for tract hospitalist, plan admission.  MDM   Chest pain with risk factors for ACS       Sunnie Nielsen, MD 06/02/11 0500

## 2011-06-02 NOTE — H&P (Addendum)
DATE OF ADMISSION:  06/02/2011  PCP:   Dr. Cynda Acres [facility]) Cardiologist:  Dr. Verdis Prime  Chief Complaint: Chest Pain   HPI: [name] is an 66 y.o. female presents with complaints of intermittent chest pain x 1 week.  She reports that the pain is substernal and radiates into the left arm at times.  The Pain was more a tightness and squeezing sensation in the chest and was associated with SOB and Nausea.  She denies any dyspnea on exertion, vomiting or diaphoresis.  She does have a history of Atrial fibrillation and has been on coumadin therapy for 1.5 years.  Her Cardiologist is Dr.Henry Katrinka Blazing.    Past Medical History  Diagnosis Date  . Hypertension   . Hypercholesteremia   . Basal cell carcinoma   . CAD (coronary artery disease)   . Atrial fibrillation     Past Surgical History  Procedure Date  . Tonsillectomy   . Abdominal hysterectomy   . Craniectomy for excision of acoustic neuroma     Medications:  HOME MEDS: Prior to Admission medications   Medication Sig Start Date End Date Taking? Authorizing Provider  buPROPion (WELLBUTRIN XL) 300 MG 24 hr tablet Take 300 mg by mouth daily.   Yes Historical Provider, MD  cholecalciferol (VITAMIN D) 1000 UNITS tablet Take 1,000 Units by mouth daily.   Yes Historical Provider, MD  Cranberry 500 MG CAPS Take by mouth.   Yes Historical Provider, MD  cyclobenzaprine (FLEXERIL) 10 MG tablet Take 10 mg by mouth 3 (three) times daily as needed.   Yes Historical Provider, MD  fluticasone (FLONASE) 50 MCG/ACT nasal spray Place 2 sprays into the nose daily.   Yes Historical Provider, MD  folic acid (FOLVITE) 1 MG tablet Take 1 mg by mouth daily.   Yes Historical Provider, MD  lisinopril (PRINIVIL,ZESTRIL) 10 MG tablet Take 2.5 mg by mouth daily.   Yes Historical Provider, MD  omega-3 acid ethyl esters (LOVAZA) 1 G capsule Take 1 g by mouth 3 (three) times daily.   Yes Historical Provider, MD  omeprazole (PRILOSEC) 20 MG  capsule Take 20 mg by mouth daily.   Yes Historical Provider, MD  rosuvastatin (CRESTOR) 20 MG tablet Take 20 mg by mouth daily.   Yes Historical Provider, MD  vitamin C (ASCORBIC ACID) 500 MG tablet Take 1,000 mg by mouth daily.   Yes Historical Provider, MD  warfarin (COUMADIN) 5 MG tablet Take 5 mg by mouth daily. 5mg  every day except on mondays patient take 2.5mg    Yes Historical Provider, MD    Allergies:  Allergies  Allergen Reactions  . Atorvastatin     REACTION: intolerant of multiple statins  . Penicillins     REACTION: Rash    Social History:   reports that she has quit smoking. She does not have any smokeless tobacco history on file. She reports that she does not drink alcohol or use illicit drugs.  Family History: CAD, HTN, and DM2 in Mother Lung Cancer in Maternal Grandfather  Who was a Smoker   Review of Systems: Positive for chest pain   The patient denies anorexia, fever, weight loss, vision loss, decreased hearing, hoarseness, syncope, dyspnea on exertion, peripheral edema, balance deficits, hemoptysis, abdominal pain, melena, hematochezia, severe indigestion/heartburn, hematuria, incontinence, genital sores, muscle weakness, suspicious skin lesions, transient blindness, difficulty walking, depression, unusual weight change, abnormal bleeding, enlarged lymph nodes, angioedema, and breast masses.   Physical Exam:  GEN:  Pleasant  Well nourished and  well developed 66 year old Caucasian female  examined  and in no acute distress; cooperative with exam Filed Vitals:   06/01/11 2345 06/02/11 0116  BP: 129/87 147/80  Pulse: 73   Temp: 97.6 F (36.4 C)   TempSrc: Oral   Resp: 18   SpO2: 100%    Blood pressure 147/80, pulse 73, temperature 97.6 F (36.4 C), temperature source Oral, resp. rate 18, SpO2 100.00%. PSYCH: She is alert and oriented x4; does not appear anxious does not appear depressed; affect is normal HEENT: Normocephalic and Atraumatic, Mucous  membranes pink; PERRLA; EOM intact; Fundi:  Benign;  No scleral icterus, Nares: Patent, Oropharynx: Clear, Fair Dentition, Neck:  FROM, no cervical lymphadenopathy nor thyromegaly or carotid bruit; no JVD; Breasts:: Not examined CHEST WALL: No tenderness CHEST: Normal respiration, clear to auscultation bilaterally HEART: Irregular rate and rhythm; no murmurs rubs or gallops BACK: No kyphosis or scoliosis; no CVA tenderness ABDOMEN: Positive Bowel Sounds, Scaphoid, Obese, soft non-tender; no masses, no organomegaly.   Rectal Exam: Not done EXTREMITIES: No bone or joint deformity; age-appropriate arthropathy of the hands and knees; no cyanosis, clubbing or edema; no ulcerations. Genitalia: not examined PULSES: 2+ and symmetric SKIN: Normal hydration no rash or ulceration CNS: Cranial nerves 2-12 grossly intact no focal neurologic deficit   Labs & Imaging Results for orders placed during the hospital encounter of 06/01/11 (from the past 48 hour(s))  CBC     Status: Normal   Collection Time   06/02/11 12:57 AM      Component Value Range Comment   WBC 7.1  4.0 - 10.5 (K/uL)    RBC 4.74  3.87 - 5.11 (MIL/uL)    Hemoglobin 12.7  12.0 - 15.0 (g/dL)    HCT 54.0  98.1 - 19.1 (%)    MCV 82.7  78.0 - 100.0 (fL)    MCH 26.8  26.0 - 34.0 (pg)    MCHC 32.4  30.0 - 36.0 (g/dL)    RDW 47.8  29.5 - 62.1 (%)    Platelets 279  150 - 400 (K/uL)   COMPREHENSIVE METABOLIC PANEL     Status: Abnormal   Collection Time   06/02/11 12:57 AM      Component Value Range Comment   Sodium 137  135 - 145 (mEq/L)    Potassium 3.9  3.5 - 5.1 (mEq/L)    Chloride 99  96 - 112 (mEq/L)    CO2 30  19 - 32 (mEq/L)    Glucose, Bld 107 (*) 70 - 99 (mg/dL)    BUN 13  6 - 23 (mg/dL)    Creatinine, Ser 3.08  0.50 - 1.10 (mg/dL)    Calcium 9.8  8.4 - 10.5 (mg/dL)    Total Protein 6.9  6.0 - 8.3 (g/dL)    Albumin 4.1  3.5 - 5.2 (g/dL)    AST 27  0 - 37 (U/L)    ALT 26  0 - 35 (U/L)    Alkaline Phosphatase 62  39 - 117  (U/L)    Total Bilirubin 0.3  0.3 - 1.2 (mg/dL)    GFR calc non Af Amer 64 (*) >90 (mL/min)    GFR calc Af Amer 75 (*) >90 (mL/min)   TROPONIN I     Status: Normal   Collection Time   06/02/11 12:57 AM      Component Value Range Comment   Troponin I <0.30  <0.30 (ng/mL)   PROTIME-INR     Status: Abnormal  Collection Time   06/02/11 12:57 AM      Component Value Range Comment   Prothrombin Time 24.2 (*) 11.6 - 15.2 (seconds)    INR 2.13 (*) 0.00 - 1.49     Dg Chest 2 View  06/02/2011  *RADIOLOGY REPORT*  Clinical Data: Chest pain and nausea.  CHEST - 2 VIEW  Comparison: 01/19/2011  Findings: Two views of the chest demonstrate hyperinflation which is similar to the prior examination. Heart and mediastinum are within normal limits.  Trachea is midline.  No focal airspace disease or edema.  Bony structures are intact.  IMPRESSION: Stable hyperinflation which could represent emphysematous changes. No acute findings.  Original Report Authenticated By: Richarda Overlie, M.D.     EKG:  Atrial Fibrillation which is rate controlled , and No Acute S-T segment changes Seen   Assessment/Plan: Present on Admission:  .Chest pain .CAD (coronary artery disease) .HYPERTENSION .Hypercholesteremia .Atrial fibrillation    Plan:     Admit to Observation Telemetry Bed Cardiac Enzymes Nitropaste, O2, and ASA therapy Reconcile Home Medications Coumadin therapy and adjustments per Pharmacy Further cardiac workup pending results and clinical course Other plans as per orders.      CODE STATUS:      FULL CODE        Nickalos Petersen C 06/02/2011, 6:07 AM

## 2011-06-02 NOTE — ED Notes (Signed)
Patient denies pain and is resting comfortably.  

## 2011-06-02 NOTE — ED Notes (Signed)
Patient is resting comfortably. 

## 2011-06-02 NOTE — Discharge Summary (Signed)
[facility] Hospital Discharge Summary  Date of Admission: 06/01/2011 11:36 PM Admitter: @ADMITPROV @   Date of Discharge2/25/2013 Attending Physician: Sunnie Nielsen, MD  66 y.o. female presents with complaints of intermittent chest pain x 1 week. She reports that the pain is substernal and radiates into the left arm at times. The Pain was more a tightness and squeezing sensation in the chest and was associated with SOB and Nausea. She denies any dyspnea on exertion, vomiting or diaphoresis. She does have a history of Atrial fibrillation and has been on coumadin therapy for 1.5 years. Her Cardiologist is Dr.Henry Katrinka Blazing  She was found to be in NSR and 3 sets of CE's were performed which were normal.  A rpt EKG was ordered which showed no specific change from admission EKG.  She complained of having a mild headache and her Nitropaste was discontinued. After lunch she was noted to have some abdominal discomfort and pain and it was thought the etiology of her pain was non-cardiogenic and more in keeping with GERD.  I have increased her PPI Omeprazole from 20-->40 mg and she has an appointment already scheduled to see Dr. Katrinka Blazing as her blood pressure has been on the low side.  She will cotinue her lisinopril 2.5 until she sees him in follow up.  Full POC was discussed with patient and Daughter present in room and they very much prefer to be discharged today  Procedures Performed and pertinent labs: Dg Chest 2 View  06/02/2011  *RADIOLOGY REPORT*  Clinical Data: Chest pain and nausea.  CHEST - 2 VIEW  Comparison: 01/19/2011  Findings: Two views of the chest demonstrate hyperinflation which is similar to the prior examination. Heart and mediastinum are within normal limits.  Trachea is midline.  No focal airspace disease or edema.  Bony structures are intact.  IMPRESSION: Stable hyperinflation which could represent emphysematous changes. No acute findings.  Original Report Authenticated By: Richarda Overlie, M.D.      Discharge Vitals & PE:  BP 104/80  Pulse 85  Temp(Src) 97.6 F (36.4 C) (Oral)  Resp 18  SpO2 100% Alert, oriented cta b  s1 s2 no m/r/g abd soft,, nt nd Neuro grossly intact  Discharge Labs:  Results for orders placed during the hospital encounter of 06/01/11 (from the past 24 hour(s))  CBC     Status: Normal   Collection Time   06/02/11 12:57 AM      Component Value Range   WBC 7.1  4.0 - 10.5 (K/uL)   RBC 4.74  3.87 - 5.11 (MIL/uL)   Hemoglobin 12.7  12.0 - 15.0 (g/dL)   HCT 40.9  81.1 - 91.4 (%)   MCV 82.7  78.0 - 100.0 (fL)   MCH 26.8  26.0 - 34.0 (pg)   MCHC 32.4  30.0 - 36.0 (g/dL)   RDW 78.2  95.6 - 21.3 (%)   Platelets 279  150 - 400 (K/uL)  COMPREHENSIVE METABOLIC PANEL     Status: Abnormal   Collection Time   06/02/11 12:57 AM      Component Value Range   Sodium 137  135 - 145 (mEq/L)   Potassium 3.9  3.5 - 5.1 (mEq/L)   Chloride 99  96 - 112 (mEq/L)   CO2 30  19 - 32 (mEq/L)   Glucose, Bld 107 (*) 70 - 99 (mg/dL)   BUN 13  6 - 23 (mg/dL)   Creatinine, Ser 0.86  0.50 - 1.10 (mg/dL)   Calcium 9.8  8.4 -  10.5 (mg/dL)   Total Protein 6.9  6.0 - 8.3 (g/dL)   Albumin 4.1  3.5 - 5.2 (g/dL)   AST 27  0 - 37 (U/L)   ALT 26  0 - 35 (U/L)   Alkaline Phosphatase 62  39 - 117 (U/L)   Total Bilirubin 0.3  0.3 - 1.2 (mg/dL)   GFR calc non Af Amer 64 (*) >90 (mL/min)   GFR calc Af Amer 75 (*) >90 (mL/min)  TROPONIN I     Status: Normal   Collection Time   06/02/11 12:57 AM      Component Value Range   Troponin I <0.30  <0.30 (ng/mL)  PROTIME-INR     Status: Abnormal   Collection Time   06/02/11 12:57 AM      Component Value Range   Prothrombin Time 24.2 (*) 11.6 - 15.2 (seconds)   INR 2.13 (*) 0.00 - 1.49   CARDIAC PANEL(CRET KIN+CKTOT+MB+TROPI)     Status: Normal   Collection Time   06/02/11  6:50 AM      Component Value Range   Total CK 66  7 - 177 (U/L)   CK, MB 3.8  0.3 - 4.0 (ng/mL)   Troponin I <0.30  <0.30 (ng/mL)   Relative Index RELATIVE INDEX IS  INVALID  0.0 - 2.5     Disposition and follow-up:   [name] was discharged from in stable condition.    Follow-up Appointments:No Follow-up on file.    Discharge Medications: Medication List  As of 06/02/2011  3:37 PM   TAKE these medications         buPROPion 300 MG 24 hr tablet   Commonly known as: WELLBUTRIN XL   Take 300 mg by mouth daily.      cholecalciferol 1000 UNITS tablet   Commonly known as: VITAMIN D   Take 1,000 Units by mouth daily.      Cranberry 500 MG Caps   Take by mouth.      cyclobenzaprine 10 MG tablet   Commonly known as: FLEXERIL   Take 10 mg by mouth 3 (three) times daily as needed.      fluticasone 50 MCG/ACT nasal spray   Commonly known as: FLONASE   Place 2 sprays into the nose daily.      folic acid 1 MG tablet   Commonly known as: FOLVITE   Take 1 mg by mouth daily.      lisinopril 10 MG tablet   Commonly known as: PRINIVIL,ZESTRIL   Take 2.5 mg by mouth daily.      omega-3 acid ethyl esters 1 G capsule   Commonly known as: LOVAZA   Take 1 g by mouth 3 (three) times daily.      omeprazole 20 MG capsule   Commonly known as: PRILOSEC   Take 2 capsules (40 mg total) by mouth daily.      rosuvastatin 20 MG tablet   Commonly known as: CRESTOR   Take 20 mg by mouth daily.      vitamin C 500 MG tablet   Commonly known as: ASCORBIC ACID   Take 1,000 mg by mouth daily.      warfarin 5 MG tablet   Commonly known as: COUMADIN   Take 5 mg by mouth daily. 5mg  every day except on mondays patient take 2.5mg            There are no discontinued medications.  SignedPleas Koch 06/02/2011, 3:33 PM

## 2011-06-04 DIAGNOSIS — I10 Essential (primary) hypertension: Secondary | ICD-10-CM | POA: Diagnosis not present

## 2011-06-04 DIAGNOSIS — I4891 Unspecified atrial fibrillation: Secondary | ICD-10-CM | POA: Diagnosis not present

## 2011-06-04 DIAGNOSIS — R002 Palpitations: Secondary | ICD-10-CM | POA: Diagnosis not present

## 2011-06-09 DIAGNOSIS — I4891 Unspecified atrial fibrillation: Secondary | ICD-10-CM | POA: Diagnosis not present

## 2011-06-09 DIAGNOSIS — F411 Generalized anxiety disorder: Secondary | ICD-10-CM | POA: Diagnosis not present

## 2011-06-09 DIAGNOSIS — R002 Palpitations: Secondary | ICD-10-CM | POA: Diagnosis not present

## 2011-06-14 DIAGNOSIS — R002 Palpitations: Secondary | ICD-10-CM | POA: Diagnosis not present

## 2011-06-26 DIAGNOSIS — I4891 Unspecified atrial fibrillation: Secondary | ICD-10-CM | POA: Diagnosis not present

## 2011-07-07 DIAGNOSIS — F3289 Other specified depressive episodes: Secondary | ICD-10-CM | POA: Diagnosis not present

## 2011-07-07 DIAGNOSIS — F329 Major depressive disorder, single episode, unspecified: Secondary | ICD-10-CM | POA: Diagnosis not present

## 2011-07-07 DIAGNOSIS — F411 Generalized anxiety disorder: Secondary | ICD-10-CM | POA: Diagnosis not present

## 2011-07-17 DIAGNOSIS — I4891 Unspecified atrial fibrillation: Secondary | ICD-10-CM | POA: Diagnosis not present

## 2011-08-05 DIAGNOSIS — H2510 Age-related nuclear cataract, unspecified eye: Secondary | ICD-10-CM | POA: Diagnosis not present

## 2011-08-07 DIAGNOSIS — I4891 Unspecified atrial fibrillation: Secondary | ICD-10-CM | POA: Diagnosis not present

## 2011-08-20 DIAGNOSIS — F411 Generalized anxiety disorder: Secondary | ICD-10-CM | POA: Diagnosis not present

## 2011-08-21 DIAGNOSIS — L905 Scar conditions and fibrosis of skin: Secondary | ICD-10-CM | POA: Diagnosis not present

## 2011-08-21 DIAGNOSIS — D485 Neoplasm of uncertain behavior of skin: Secondary | ICD-10-CM | POA: Diagnosis not present

## 2011-09-01 DIAGNOSIS — J029 Acute pharyngitis, unspecified: Secondary | ICD-10-CM | POA: Diagnosis not present

## 2011-09-04 DIAGNOSIS — I4891 Unspecified atrial fibrillation: Secondary | ICD-10-CM | POA: Diagnosis not present

## 2011-09-04 DIAGNOSIS — J029 Acute pharyngitis, unspecified: Secondary | ICD-10-CM | POA: Diagnosis not present

## 2011-09-19 DIAGNOSIS — I4891 Unspecified atrial fibrillation: Secondary | ICD-10-CM | POA: Diagnosis not present

## 2011-09-29 DIAGNOSIS — I4891 Unspecified atrial fibrillation: Secondary | ICD-10-CM | POA: Diagnosis not present

## 2011-10-06 DIAGNOSIS — F411 Generalized anxiety disorder: Secondary | ICD-10-CM | POA: Diagnosis not present

## 2011-10-06 DIAGNOSIS — R300 Dysuria: Secondary | ICD-10-CM | POA: Diagnosis not present

## 2011-10-10 DIAGNOSIS — R319 Hematuria, unspecified: Secondary | ICD-10-CM | POA: Diagnosis not present

## 2011-10-10 DIAGNOSIS — R300 Dysuria: Secondary | ICD-10-CM | POA: Diagnosis not present

## 2011-10-10 DIAGNOSIS — I4891 Unspecified atrial fibrillation: Secondary | ICD-10-CM | POA: Diagnosis not present

## 2011-10-20 DIAGNOSIS — I4891 Unspecified atrial fibrillation: Secondary | ICD-10-CM | POA: Diagnosis not present

## 2011-10-30 DIAGNOSIS — Z7901 Long term (current) use of anticoagulants: Secondary | ICD-10-CM | POA: Diagnosis not present

## 2011-11-11 DIAGNOSIS — I4891 Unspecified atrial fibrillation: Secondary | ICD-10-CM | POA: Diagnosis not present

## 2011-11-22 DIAGNOSIS — J019 Acute sinusitis, unspecified: Secondary | ICD-10-CM | POA: Diagnosis not present

## 2011-11-22 DIAGNOSIS — J209 Acute bronchitis, unspecified: Secondary | ICD-10-CM | POA: Diagnosis not present

## 2011-11-24 DIAGNOSIS — I4891 Unspecified atrial fibrillation: Secondary | ICD-10-CM | POA: Diagnosis not present

## 2011-11-29 DIAGNOSIS — H6980 Other specified disorders of Eustachian tube, unspecified ear: Secondary | ICD-10-CM | POA: Diagnosis not present

## 2011-12-02 DIAGNOSIS — I4891 Unspecified atrial fibrillation: Secondary | ICD-10-CM | POA: Diagnosis not present

## 2011-12-03 DIAGNOSIS — F411 Generalized anxiety disorder: Secondary | ICD-10-CM | POA: Diagnosis not present

## 2011-12-03 DIAGNOSIS — R040 Epistaxis: Secondary | ICD-10-CM | POA: Diagnosis not present

## 2011-12-10 ENCOUNTER — Other Ambulatory Visit (HOSPITAL_COMMUNITY): Payer: Self-pay | Admitting: Family Medicine

## 2011-12-10 DIAGNOSIS — Z1231 Encounter for screening mammogram for malignant neoplasm of breast: Secondary | ICD-10-CM

## 2011-12-11 DIAGNOSIS — I4891 Unspecified atrial fibrillation: Secondary | ICD-10-CM | POA: Diagnosis not present

## 2011-12-17 ENCOUNTER — Ambulatory Visit (HOSPITAL_COMMUNITY): Payer: Medicare HMO

## 2011-12-23 ENCOUNTER — Ambulatory Visit (HOSPITAL_COMMUNITY)
Admission: RE | Admit: 2011-12-23 | Discharge: 2011-12-23 | Disposition: A | Discharge status: Home / Self Care | Payer: Medicare Other | Source: Ambulatory Visit | Attending: Family Medicine | Admitting: Family Medicine

## 2011-12-23 DIAGNOSIS — Z1231 Encounter for screening mammogram for malignant neoplasm of breast: Secondary | ICD-10-CM | POA: Diagnosis not present

## 2012-01-01 DIAGNOSIS — I4891 Unspecified atrial fibrillation: Secondary | ICD-10-CM | POA: Diagnosis not present

## 2012-01-08 DIAGNOSIS — I4891 Unspecified atrial fibrillation: Secondary | ICD-10-CM | POA: Diagnosis not present

## 2012-01-09 DIAGNOSIS — Z23 Encounter for immunization: Secondary | ICD-10-CM | POA: Diagnosis not present

## 2012-01-09 DIAGNOSIS — N898 Other specified noninflammatory disorders of vagina: Secondary | ICD-10-CM | POA: Diagnosis not present

## 2012-01-29 DIAGNOSIS — I4891 Unspecified atrial fibrillation: Secondary | ICD-10-CM | POA: Diagnosis not present

## 2012-02-05 DIAGNOSIS — I4891 Unspecified atrial fibrillation: Secondary | ICD-10-CM | POA: Diagnosis not present

## 2012-02-19 DIAGNOSIS — I4891 Unspecified atrial fibrillation: Secondary | ICD-10-CM | POA: Diagnosis not present

## 2012-03-10 DIAGNOSIS — Z7901 Long term (current) use of anticoagulants: Secondary | ICD-10-CM | POA: Diagnosis not present

## 2012-03-10 DIAGNOSIS — E7800 Pure hypercholesterolemia, unspecified: Secondary | ICD-10-CM | POA: Diagnosis not present

## 2012-03-10 DIAGNOSIS — I10 Essential (primary) hypertension: Secondary | ICD-10-CM | POA: Diagnosis not present

## 2012-03-10 DIAGNOSIS — G459 Transient cerebral ischemic attack, unspecified: Secondary | ICD-10-CM | POA: Diagnosis not present

## 2012-03-10 DIAGNOSIS — I4891 Unspecified atrial fibrillation: Secondary | ICD-10-CM | POA: Diagnosis not present

## 2012-04-12 DIAGNOSIS — Z7901 Long term (current) use of anticoagulants: Secondary | ICD-10-CM | POA: Diagnosis not present

## 2012-04-12 DIAGNOSIS — I4891 Unspecified atrial fibrillation: Secondary | ICD-10-CM | POA: Diagnosis not present

## 2012-05-11 DIAGNOSIS — I4891 Unspecified atrial fibrillation: Secondary | ICD-10-CM | POA: Diagnosis not present

## 2012-05-11 DIAGNOSIS — Z7901 Long term (current) use of anticoagulants: Secondary | ICD-10-CM | POA: Diagnosis not present

## 2012-06-09 DIAGNOSIS — J019 Acute sinusitis, unspecified: Secondary | ICD-10-CM | POA: Diagnosis not present

## 2012-06-10 DIAGNOSIS — I4891 Unspecified atrial fibrillation: Secondary | ICD-10-CM | POA: Diagnosis not present

## 2012-06-10 DIAGNOSIS — Z7901 Long term (current) use of anticoagulants: Secondary | ICD-10-CM | POA: Diagnosis not present

## 2012-06-17 DIAGNOSIS — I4891 Unspecified atrial fibrillation: Secondary | ICD-10-CM | POA: Diagnosis not present

## 2012-06-17 DIAGNOSIS — Z7901 Long term (current) use of anticoagulants: Secondary | ICD-10-CM | POA: Diagnosis not present

## 2012-07-15 DIAGNOSIS — R51 Headache: Secondary | ICD-10-CM | POA: Diagnosis not present

## 2012-07-16 DIAGNOSIS — H2510 Age-related nuclear cataract, unspecified eye: Secondary | ICD-10-CM | POA: Diagnosis not present

## 2012-07-16 DIAGNOSIS — H25019 Cortical age-related cataract, unspecified eye: Secondary | ICD-10-CM | POA: Diagnosis not present

## 2012-07-16 DIAGNOSIS — H18419 Arcus senilis, unspecified eye: Secondary | ICD-10-CM | POA: Diagnosis not present

## 2012-07-16 DIAGNOSIS — H25049 Posterior subcapsular polar age-related cataract, unspecified eye: Secondary | ICD-10-CM | POA: Diagnosis not present

## 2012-07-22 DIAGNOSIS — Z7901 Long term (current) use of anticoagulants: Secondary | ICD-10-CM | POA: Diagnosis not present

## 2012-07-22 DIAGNOSIS — I4891 Unspecified atrial fibrillation: Secondary | ICD-10-CM | POA: Diagnosis not present

## 2012-08-05 DIAGNOSIS — J069 Acute upper respiratory infection, unspecified: Secondary | ICD-10-CM | POA: Diagnosis not present

## 2012-08-11 DIAGNOSIS — I4891 Unspecified atrial fibrillation: Secondary | ICD-10-CM | POA: Diagnosis not present

## 2012-08-11 DIAGNOSIS — Z7901 Long term (current) use of anticoagulants: Secondary | ICD-10-CM | POA: Diagnosis not present

## 2012-08-18 ENCOUNTER — Encounter: Payer: Self-pay | Admitting: Neurology

## 2012-08-19 ENCOUNTER — Ambulatory Visit (INDEPENDENT_AMBULATORY_CARE_PROVIDER_SITE_OTHER): Payer: Medicare Other | Admitting: Neurology

## 2012-08-19 ENCOUNTER — Other Ambulatory Visit: Payer: Self-pay | Admitting: *Deleted

## 2012-08-19 ENCOUNTER — Encounter: Payer: Self-pay | Admitting: Neurology

## 2012-08-19 VITALS — BP 136/84 | HR 56 | Temp 97.6°F | Ht 65.5 in | Wt 140.0 lb

## 2012-08-19 DIAGNOSIS — G459 Transient cerebral ischemic attack, unspecified: Secondary | ICD-10-CM

## 2012-08-19 NOTE — Patient Instructions (Signed)
Return for carotid doppler study. If normal, no need for follow up appt.

## 2012-08-19 NOTE — Progress Notes (Signed)
[facility]  PATIENT: [name] DOB: 11-04-1945   HISTORY FROM: patient REASON FOR VISIT: routine follow up HISTORY OF PRESENT ILLNESS: [name] is a 67 year old lady with a transient episode of confusion dizziness and gait difficulty likely posterior circulation TIA in October 2011. Vascular risk factors of hypertension, hyperlipidemia and history of paroxysmal atrial fibrillation. Remote history of right-sided acoustic neuroma surgery in 1999. She has continued on warfarin without problems. In 2011 she had a stress test with Dr. Anne Fu which was unremarkable. She had MRA and neck which showed no significant stenosis. MRI brain showed mild small vessel disease changes without acute abnormalities.  Today in office 08/19/2012, patient is doing very well, she has not had any new TIA-like symptoms. All of her risk factors, hypertension, hyperlipidemia and A. fib are well controlled. She states that she tolerates the warfarin well and that her INR is stable. She reports that she would like to lose some weight and wants to start exercising more. She states that she has an appointment in August with her PCP to recheck her lipids. Since starting Prozac and Klonopin she has been able to discontinue her blood pressure medicines. Her blood pressure in office is 136/84 and she states that it is higher than it usually is at home.   REVIEW OF SYSTEMS: Full 14 system review of systems performed and notable only for constitutional: Weight gain cardiovascular: Some palpitations psychiatric: anxiety   ALLERGIES: Allergies  Allergen Reactions  . Atorvastatin     REACTION: intolerant of multiple statins  . Penicillins     REACTION: Rash    HOME MEDICATIONS: Outpatient Prescriptions Prior to Visit  Medication Sig Dispense Refill  . buPROPion (WELLBUTRIN XL) 300 MG 24 hr tablet Take 300 mg by mouth daily.      . Cranberry 500 MG CAPS Take by mouth.      . fluticasone  (FLONASE) 50 MCG/ACT nasal spray Place 2 sprays into the nose daily as needed.       [name] omeprazole (PRILOSEC) 20 MG capsule Take 2 capsules (40 mg total) by mouth daily.      . rosuvastatin (CRESTOR) 20 MG tablet Take 20 mg by mouth daily.      . vitamin C (ASCORBIC ACID) 500 MG tablet Take 1,000 mg by mouth daily.      [name] warfarin (COUMADIN) 5 MG tablet Take 5 mg by mouth daily. 5mg  every day except on mondays patient take 2.5mg       . cholecalciferol (VITAMIN D) 1000 UNITS tablet Take 1,000 Units by mouth daily.      . cyclobenzaprine (FLEXERIL) 10 MG tablet Take 10 mg by mouth 3 (three) times daily as needed.      . folic acid (FOLVITE) 1 MG tablet Take 1 mg by mouth daily.      [name] lisinopril (PRINIVIL,ZESTRIL) 10 MG tablet Take 2.5 mg by mouth daily.      [name] omega-3 acid ethyl esters (LOVAZA) 1 G capsule Take 1 g by mouth 3 (three) times daily.       No facility-administered medications prior to visit.    PAST MEDICAL HISTORY: Past Medical History  Diagnosis Date  . Hypertension   . Hypercholesteremia   . Basal cell carcinoma   . CAD (coronary artery disease)   . Headache     MIGRAINES  . Atrial fibrillation   . Anxiety   . History of shingles   . Nocturnal muscle cramp     HISTORY IN  LEFT LEG  . Hx: UTI (urinary tract infection)   . Deafness in right ear     PAST SURGICAL HISTORY: Past Surgical History  Procedure Laterality Date  . Tonsillectomy    . Craniectomy for excision of acoustic neuroma  1999  . Abdominal hysterectomy  1982  . Breast biopsy      X 3    FAMILY HISTORY: Family History  Problem Relation Age of Onset  . Cancer Father   . Diabetes Mother   . High Cholesterol Mother   . Hypertension Mother   . Memory loss Mother     SOCIAL HISTORY: History   Social History  . Marital Status: Married    Spouse Name: [name]    Number of Children: 3  . Years of Education: masters   Occupational History  . retired     [employer]   Social History Main  Topics  . Smoking status: Never Smoker   . Smokeless tobacco: Never Used  . Alcohol Use: No  . Drug Use: No  . Sexually Active: No   Other Topics Concern  . Not on file   Social History Narrative   Pt lives with spouse.    Caffeine Use: 1-2 glasses per week.     PHYSICAL EXAM  Filed Vitals:   08/19/12 1411  BP: 136/84  Pulse: 56  Temp: 97.6 F (36.4 C)  TempSrc: Oral  Height: 5' 5.5" (1.664 m)  Weight: 140 lb (63.504 kg)   Body mass index is 22.93 kg/(m^2).  GENERAL EXAM: Patient is in no distress, well developed and well groomed. HEAD: Symmetric facial features. EARS, NOSE, and THROAT: Normal.  NECK: Supple, no JVD RESPIRATORY: Lungs CTA. CARDIOVASCULAR: Regular rate and rhythm,soft 1/6 systolic murmur, no carotid bruits SKIN: No rash, no bruising  NEUROLOGIC: MENTAL STATUS: awake, alert and oriented to person, place and time, language fluent, comprehension intact, naming intact CRANIAL NERVE: pupils equal and reactive to light, visual fields full to confrontation, extraocular muscles intact, no nystagmus, facial sensation and strength symmetric, uvula midline, shoulder shrug symmetric, tongue midline. MOTOR: normal bulk and tone, full strength in the BUE, BLE SENSORY: normal and symmetric to light touch, pinprick, temperature, vibration and proprioception COORDINATION: finger-nose-finger, fine finger movements normal REFLEXES: deep tendon reflexes present and symmetric 2+ GAIT/STATION: narrow based gait; able to walk on toes, heels and tandem; romberg is negative. No assistive device.   DIAGNOSTIC DATA (LABS, IMAGING, TESTING) - I reviewed patient records, labs, notes, testing and imaging myself where available.  Lab Results  Component Value Date   WBC 7.1 06/02/2011   HGB 12.7 06/02/2011   HCT 39.2 06/02/2011   MCV 82.7 06/02/2011   PLT 279 06/02/2011      Component Value Date/Time   NA 137 06/02/2011 0057   K 3.9 06/02/2011 0057   CL 99 06/02/2011 0057    CO2 30 06/02/2011 0057   GLUCOSE 107* 06/02/2011 0057   BUN 13 06/02/2011 0057   CREATININE 0.91 06/02/2011 0057   CALCIUM 9.8 06/02/2011 0057   PROT 6.9 06/02/2011 0057   ALBUMIN 4.1 06/02/2011 0057   AST 27 06/02/2011 0057   ALT 26 06/02/2011 0057   ALKPHOS 62 06/02/2011 0057   BILITOT 0.3 06/02/2011 0057   GFRNONAA 64* 06/02/2011 0057   GFRAA 75* 06/02/2011 0057   Lab Results  Component Value Date   CHOL 280* 10/23/2009   HDL 73.10 10/23/2009   LDLDIRECT 177.7 10/23/2009   TRIG 148.0 10/23/2009   CHOLHDL  4 10/23/2009   No results found for this basename: HGBA1C   No results found for this basename: VITAMINB12   Lab Results  Component Value Date   TSH 3.94 09/29/2008     ASSESSMENT AND PLAN  Ms. Ureste is a 67 year old white female here for follow up of transient episode of confusion, dizziness, and gait difficulties likely posterior circulation TIA on October 2011. Vascular risk factors of hyperlipidemia and history of paroxysmal atrial fibrillation. Hypertension is well controlled since starting Prozac and Klonopin. Remote history of right-sided acoustic neuroma surgery in 1999.  Plan: She is tolerating warfarin well, with stable INRs and no bleeding. We discussed changing to one of the newer anticoagulants, but she states she doesn't mind having blood tests in its inexpensive so she will continue with the warfarin. Maintain tight control of hyperlipidemia with LDL goal for less than 100. Encouraged exercise and healthy diet. We have ordered a repeat carotid Doppler study. If normal, patient will not need to follow up in office unless new problems occur.  Nikelle Malatesta NP-C 08/19/2012, 4:53 PM  [facility] [address] [place], [place] [address] [phone]  I have personally examined this patient, reviewed pertinent data, developed plan of care and discussed with patient and agree with above.  Delia Heady, MD

## 2012-08-26 ENCOUNTER — Ambulatory Visit (INDEPENDENT_AMBULATORY_CARE_PROVIDER_SITE_OTHER): Payer: Medicare Other

## 2012-08-26 DIAGNOSIS — I669 Occlusion and stenosis of unspecified cerebral artery: Secondary | ICD-10-CM

## 2012-08-26 DIAGNOSIS — G459 Transient cerebral ischemic attack, unspecified: Secondary | ICD-10-CM

## 2012-09-08 DIAGNOSIS — I4891 Unspecified atrial fibrillation: Secondary | ICD-10-CM | POA: Diagnosis not present

## 2012-09-08 DIAGNOSIS — Z7901 Long term (current) use of anticoagulants: Secondary | ICD-10-CM | POA: Diagnosis not present

## 2012-09-10 ENCOUNTER — Telehealth: Payer: Self-pay | Admitting: Nurse Practitioner

## 2012-09-10 NOTE — Telephone Encounter (Signed)
Patient calling for her Doppler result didn't see report in Epic.

## 2012-09-10 NOTE — Telephone Encounter (Signed)
Patient is calling to get her Carotid Doppler study results.

## 2012-09-14 NOTE — Telephone Encounter (Signed)
Can you check on the results of this doppler study?  It was done on 5/22 and there are no final results yet.

## 2012-09-15 ENCOUNTER — Telehealth: Payer: Self-pay | Admitting: Neurology

## 2012-09-15 NOTE — Telephone Encounter (Signed)
Called patient and left message normal results doppler.

## 2012-09-28 DIAGNOSIS — G459 Transient cerebral ischemic attack, unspecified: Secondary | ICD-10-CM | POA: Diagnosis not present

## 2012-09-28 DIAGNOSIS — Z01810 Encounter for preprocedural cardiovascular examination: Secondary | ICD-10-CM | POA: Diagnosis not present

## 2012-09-28 DIAGNOSIS — I4891 Unspecified atrial fibrillation: Secondary | ICD-10-CM | POA: Diagnosis not present

## 2012-09-28 DIAGNOSIS — E785 Hyperlipidemia, unspecified: Secondary | ICD-10-CM | POA: Diagnosis not present

## 2012-10-04 DIAGNOSIS — H269 Unspecified cataract: Secondary | ICD-10-CM | POA: Diagnosis not present

## 2012-10-04 DIAGNOSIS — H2510 Age-related nuclear cataract, unspecified eye: Secondary | ICD-10-CM | POA: Diagnosis not present

## 2012-10-05 DIAGNOSIS — H2510 Age-related nuclear cataract, unspecified eye: Secondary | ICD-10-CM | POA: Diagnosis not present

## 2012-10-06 DIAGNOSIS — I4891 Unspecified atrial fibrillation: Secondary | ICD-10-CM | POA: Diagnosis not present

## 2012-10-06 DIAGNOSIS — Z7901 Long term (current) use of anticoagulants: Secondary | ICD-10-CM | POA: Diagnosis not present

## 2012-10-20 DIAGNOSIS — I4891 Unspecified atrial fibrillation: Secondary | ICD-10-CM | POA: Diagnosis not present

## 2012-10-20 DIAGNOSIS — Z7901 Long term (current) use of anticoagulants: Secondary | ICD-10-CM | POA: Diagnosis not present

## 2012-10-25 DIAGNOSIS — H2510 Age-related nuclear cataract, unspecified eye: Secondary | ICD-10-CM | POA: Diagnosis not present

## 2012-10-25 DIAGNOSIS — H269 Unspecified cataract: Secondary | ICD-10-CM | POA: Diagnosis not present

## 2012-11-01 DIAGNOSIS — H2510 Age-related nuclear cataract, unspecified eye: Secondary | ICD-10-CM | POA: Diagnosis not present

## 2012-11-04 DIAGNOSIS — Z7901 Long term (current) use of anticoagulants: Secondary | ICD-10-CM | POA: Diagnosis not present

## 2012-11-04 DIAGNOSIS — I4891 Unspecified atrial fibrillation: Secondary | ICD-10-CM | POA: Diagnosis not present

## 2012-11-24 DIAGNOSIS — Z85828 Personal history of other malignant neoplasm of skin: Secondary | ICD-10-CM | POA: Diagnosis not present

## 2012-11-24 DIAGNOSIS — L820 Inflamed seborrheic keratosis: Secondary | ICD-10-CM | POA: Diagnosis not present

## 2012-11-30 DIAGNOSIS — Z7901 Long term (current) use of anticoagulants: Secondary | ICD-10-CM | POA: Diagnosis not present

## 2012-11-30 DIAGNOSIS — I4891 Unspecified atrial fibrillation: Secondary | ICD-10-CM | POA: Diagnosis not present

## 2012-12-07 DIAGNOSIS — R12 Heartburn: Secondary | ICD-10-CM | POA: Diagnosis not present

## 2012-12-07 DIAGNOSIS — Z23 Encounter for immunization: Secondary | ICD-10-CM | POA: Diagnosis not present

## 2012-12-21 DIAGNOSIS — K219 Gastro-esophageal reflux disease without esophagitis: Secondary | ICD-10-CM | POA: Diagnosis not present

## 2012-12-27 DIAGNOSIS — K449 Diaphragmatic hernia without obstruction or gangrene: Secondary | ICD-10-CM | POA: Diagnosis not present

## 2012-12-27 DIAGNOSIS — R12 Heartburn: Secondary | ICD-10-CM | POA: Diagnosis not present

## 2013-01-06 DIAGNOSIS — J309 Allergic rhinitis, unspecified: Secondary | ICD-10-CM | POA: Diagnosis not present

## 2013-01-06 DIAGNOSIS — J329 Chronic sinusitis, unspecified: Secondary | ICD-10-CM | POA: Diagnosis not present

## 2013-01-07 ENCOUNTER — Ambulatory Visit (INDEPENDENT_AMBULATORY_CARE_PROVIDER_SITE_OTHER): Payer: Medicare Other | Admitting: Pharmacist

## 2013-01-07 DIAGNOSIS — I4891 Unspecified atrial fibrillation: Secondary | ICD-10-CM | POA: Diagnosis not present

## 2013-01-21 ENCOUNTER — Ambulatory Visit (INDEPENDENT_AMBULATORY_CARE_PROVIDER_SITE_OTHER): Payer: Medicare Other | Admitting: Pharmacist

## 2013-01-21 DIAGNOSIS — I4891 Unspecified atrial fibrillation: Secondary | ICD-10-CM

## 2013-01-21 LAB — POCT INR: INR: 2.5

## 2013-01-31 DIAGNOSIS — I4891 Unspecified atrial fibrillation: Secondary | ICD-10-CM | POA: Diagnosis not present

## 2013-01-31 DIAGNOSIS — J019 Acute sinusitis, unspecified: Secondary | ICD-10-CM | POA: Diagnosis not present

## 2013-02-01 ENCOUNTER — Telehealth: Payer: Self-pay | Admitting: Pharmacist

## 2013-02-01 NOTE — Telephone Encounter (Signed)
Patient got shot of Rocephin yesterday, and starting on a Z-pak today.  She wants to know if she needs to adjust her warfarin dose.  A few months ago a Z-pak caused her INR to become elevated.  Patient notified to reduce warfarin today to 2.5 mg x 1 (not 5 mg), then resume her usual dose and recheck INR with me in 3 days.  Appointment made.

## 2013-02-04 ENCOUNTER — Ambulatory Visit (INDEPENDENT_AMBULATORY_CARE_PROVIDER_SITE_OTHER): Payer: Medicare Other | Admitting: Pharmacist

## 2013-02-04 DIAGNOSIS — I4891 Unspecified atrial fibrillation: Secondary | ICD-10-CM

## 2013-02-04 LAB — POCT INR: INR: 2.6

## 2013-02-22 DIAGNOSIS — Z961 Presence of intraocular lens: Secondary | ICD-10-CM | POA: Diagnosis not present

## 2013-02-22 DIAGNOSIS — H04129 Dry eye syndrome of unspecified lacrimal gland: Secondary | ICD-10-CM | POA: Diagnosis not present

## 2013-03-07 ENCOUNTER — Ambulatory Visit (INDEPENDENT_AMBULATORY_CARE_PROVIDER_SITE_OTHER): Payer: Medicare Other | Admitting: Pharmacist

## 2013-03-07 DIAGNOSIS — I4891 Unspecified atrial fibrillation: Secondary | ICD-10-CM

## 2013-03-07 LAB — POCT INR: INR: 2.2

## 2013-03-11 ENCOUNTER — Other Ambulatory Visit (HOSPITAL_COMMUNITY): Payer: Self-pay | Admitting: Family Medicine

## 2013-03-11 DIAGNOSIS — Z1231 Encounter for screening mammogram for malignant neoplasm of breast: Secondary | ICD-10-CM

## 2013-03-16 ENCOUNTER — Other Ambulatory Visit: Payer: Self-pay | Admitting: Family Medicine

## 2013-03-16 DIAGNOSIS — J3489 Other specified disorders of nose and nasal sinuses: Secondary | ICD-10-CM | POA: Diagnosis not present

## 2013-03-16 DIAGNOSIS — J309 Allergic rhinitis, unspecified: Secondary | ICD-10-CM | POA: Diagnosis not present

## 2013-03-18 ENCOUNTER — Ambulatory Visit
Admission: RE | Admit: 2013-03-18 | Discharge: 2013-03-18 | Disposition: A | Discharge status: Home / Self Care | Payer: Medicare Other | Source: Ambulatory Visit | Attending: Family Medicine | Admitting: Family Medicine

## 2013-03-18 DIAGNOSIS — J3489 Other specified disorders of nose and nasal sinuses: Secondary | ICD-10-CM

## 2013-03-29 ENCOUNTER — Ambulatory Visit (HOSPITAL_COMMUNITY): Payer: Medicare Other

## 2013-04-08 ENCOUNTER — Telehealth: Payer: Self-pay | Admitting: Pharmacist

## 2013-04-08 ENCOUNTER — Ambulatory Visit (INDEPENDENT_AMBULATORY_CARE_PROVIDER_SITE_OTHER): Payer: Medicare Other | Admitting: Pharmacist

## 2013-04-08 DIAGNOSIS — Z79899 Other long term (current) drug therapy: Secondary | ICD-10-CM

## 2013-04-08 DIAGNOSIS — I4891 Unspecified atrial fibrillation: Secondary | ICD-10-CM

## 2013-04-08 DIAGNOSIS — E785 Hyperlipidemia, unspecified: Secondary | ICD-10-CM

## 2013-04-08 LAB — POCT INR: INR: 2.1

## 2013-04-08 NOTE — Telephone Encounter (Signed)
Patient typically had her cholesterol checked and managed by her PCP, however she tells me her PCP keeps changing and she wants a regular physician managing this.  She is on Crestor 20 mg qd and last lipid panel was 04/2011.  She is tolerating this well.  Would like for our practice to start managing cholesterol.  She is due to get protime on 05/06/13 in AM, so will get a fasting lipid / liver panel that day as well.

## 2013-04-13 ENCOUNTER — Ambulatory Visit (HOSPITAL_COMMUNITY)
Admission: RE | Admit: 2013-04-13 | Discharge: 2013-04-13 | Disposition: A | Discharge status: Home / Self Care | Payer: Medicare Other | Source: Ambulatory Visit | Attending: Family Medicine | Admitting: Family Medicine

## 2013-04-13 ENCOUNTER — Other Ambulatory Visit (HOSPITAL_COMMUNITY): Payer: Self-pay | Admitting: Family Medicine

## 2013-04-13 DIAGNOSIS — Z1231 Encounter for screening mammogram for malignant neoplasm of breast: Secondary | ICD-10-CM

## 2013-05-02 DIAGNOSIS — R51 Headache: Secondary | ICD-10-CM | POA: Diagnosis not present

## 2013-05-03 ENCOUNTER — Emergency Department (HOSPITAL_COMMUNITY): Payer: Medicare Other

## 2013-05-03 ENCOUNTER — Emergency Department (HOSPITAL_COMMUNITY)
Admission: EM | Admit: 2013-05-03 | Discharge: 2013-05-03 | Disposition: A | Discharge status: Home / Self Care | Payer: Medicare Other | Attending: Emergency Medicine | Admitting: Emergency Medicine

## 2013-05-03 ENCOUNTER — Encounter (HOSPITAL_COMMUNITY): Payer: Self-pay | Admitting: Emergency Medicine

## 2013-05-03 DIAGNOSIS — Z9089 Acquired absence of other organs: Secondary | ICD-10-CM | POA: Insufficient documentation

## 2013-05-03 DIAGNOSIS — G43909 Migraine, unspecified, not intractable, without status migrainosus: Secondary | ICD-10-CM | POA: Diagnosis not present

## 2013-05-03 DIAGNOSIS — Z87440 Personal history of urinary (tract) infections: Secondary | ICD-10-CM | POA: Insufficient documentation

## 2013-05-03 DIAGNOSIS — Z7901 Long term (current) use of anticoagulants: Secondary | ICD-10-CM | POA: Insufficient documentation

## 2013-05-03 DIAGNOSIS — I2510 Atherosclerotic heart disease of native coronary artery without angina pectoris: Secondary | ICD-10-CM | POA: Diagnosis not present

## 2013-05-03 DIAGNOSIS — Z90710 Acquired absence of both cervix and uterus: Secondary | ICD-10-CM | POA: Insufficient documentation

## 2013-05-03 DIAGNOSIS — I10 Essential (primary) hypertension: Secondary | ICD-10-CM | POA: Diagnosis not present

## 2013-05-03 DIAGNOSIS — K8020 Calculus of gallbladder without cholecystitis without obstruction: Secondary | ICD-10-CM | POA: Diagnosis not present

## 2013-05-03 DIAGNOSIS — F411 Generalized anxiety disorder: Secondary | ICD-10-CM | POA: Insufficient documentation

## 2013-05-03 DIAGNOSIS — Z85828 Personal history of other malignant neoplasm of skin: Secondary | ICD-10-CM | POA: Diagnosis not present

## 2013-05-03 DIAGNOSIS — Z8619 Personal history of other infectious and parasitic diseases: Secondary | ICD-10-CM | POA: Diagnosis not present

## 2013-05-03 DIAGNOSIS — R7989 Other specified abnormal findings of blood chemistry: Secondary | ICD-10-CM | POA: Diagnosis not present

## 2013-05-03 DIAGNOSIS — E7800 Pure hypercholesterolemia, unspecified: Secondary | ICD-10-CM | POA: Diagnosis not present

## 2013-05-03 DIAGNOSIS — Z79899 Other long term (current) drug therapy: Secondary | ICD-10-CM | POA: Insufficient documentation

## 2013-05-03 DIAGNOSIS — R945 Abnormal results of liver function studies: Secondary | ICD-10-CM

## 2013-05-03 DIAGNOSIS — H9190 Unspecified hearing loss, unspecified ear: Secondary | ICD-10-CM | POA: Diagnosis not present

## 2013-05-03 DIAGNOSIS — I4891 Unspecified atrial fibrillation: Secondary | ICD-10-CM | POA: Insufficient documentation

## 2013-05-03 DIAGNOSIS — Z880 Allergy status to penicillin: Secondary | ICD-10-CM | POA: Diagnosis not present

## 2013-05-03 DIAGNOSIS — K8050 Calculus of bile duct without cholangitis or cholecystitis without obstruction: Secondary | ICD-10-CM

## 2013-05-03 LAB — COMPREHENSIVE METABOLIC PANEL
ALBUMIN: 4 g/dL (ref 3.5–5.2)
ALT: 39 U/L — ABNORMAL HIGH (ref 0–35)
AST: 91 U/L — AB (ref 0–37)
Alkaline Phosphatase: 87 U/L (ref 39–117)
BUN: 11 mg/dL (ref 6–23)
CHLORIDE: 100 meq/L (ref 96–112)
CO2: 27 mEq/L (ref 19–32)
CREATININE: 1.03 mg/dL (ref 0.50–1.10)
Calcium: 9.3 mg/dL (ref 8.4–10.5)
GFR calc Af Amer: 63 mL/min — ABNORMAL LOW (ref >90)
GFR calc non Af Amer: 55 mL/min — ABNORMAL LOW (ref >90)
Glucose, Bld: 140 mg/dL — ABNORMAL HIGH (ref 70–99)
Potassium: 4.4 mEq/L (ref 3.7–5.3)
Sodium: 138 mEq/L (ref 137–147)
TOTAL PROTEIN: 6.6 g/dL (ref 6.0–8.3)
Total Bilirubin: 0.8 mg/dL (ref 0.3–1.2)

## 2013-05-03 LAB — CBC WITH DIFFERENTIAL/PLATELET
BASOS PCT: 0 % (ref 0–1)
Basophils Absolute: 0 10*3/uL (ref 0.0–0.1)
EOS ABS: 0.1 10*3/uL (ref 0.0–0.7)
Eosinophils Relative: 1 % (ref 0–5)
HCT: 39.5 % (ref 36.0–46.0)
Hemoglobin: 12.8 g/dL (ref 12.0–15.0)
Lymphocytes Relative: 13 % (ref 12–46)
Lymphs Abs: 1.2 10*3/uL (ref 0.7–4.0)
MCH: 26.9 pg (ref 26.0–34.0)
MCHC: 32.4 g/dL (ref 30.0–36.0)
MCV: 83.2 fL (ref 78.0–100.0)
MONO ABS: 0.7 10*3/uL (ref 0.1–1.0)
Monocytes Relative: 8 % (ref 3–12)
NEUTROS ABS: 6.8 10*3/uL (ref 1.7–7.7)
Neutrophils Relative %: 78 % — ABNORMAL HIGH (ref 43–77)
Platelets: 250 10*3/uL (ref 150–400)
RBC: 4.75 MIL/uL (ref 3.87–5.11)
RDW: 15 % (ref 11.5–15.5)
WBC: 8.8 10*3/uL (ref 4.0–10.5)

## 2013-05-03 LAB — URINALYSIS, ROUTINE W REFLEX MICROSCOPIC
Glucose, UA: NEGATIVE mg/dL
Hgb urine dipstick: NEGATIVE
Ketones, ur: NEGATIVE mg/dL
NITRITE: NEGATIVE
PH: 8 (ref 5.0–8.0)
Protein, ur: NEGATIVE mg/dL
SPECIFIC GRAVITY, URINE: 1.026 (ref 1.005–1.030)
Urobilinogen, UA: 1 mg/dL (ref 0.0–1.0)

## 2013-05-03 LAB — LIPASE, BLOOD: Lipase: 19 U/L (ref 11–59)

## 2013-05-03 LAB — URINE MICROSCOPIC-ADD ON

## 2013-05-03 MED ORDER — SODIUM CHLORIDE 0.9 % IV BOLUS (SEPSIS)
1000.0000 mL | Freq: Once | INTRAVENOUS | Status: AC
  Administered 2013-05-03: 1000 mL via INTRAVENOUS

## 2013-05-03 MED ORDER — ONDANSETRON HCL 4 MG/2ML IJ SOLN
4.0000 mg | Freq: Once | INTRAMUSCULAR | Status: AC
  Administered 2013-05-03: 4 mg via INTRAVENOUS
  Filled 2013-05-03: qty 2

## 2013-05-03 MED ORDER — HYDROMORPHONE HCL PF 1 MG/ML IJ SOLN
0.5000 mg | Freq: Once | INTRAMUSCULAR | Status: AC
  Administered 2013-05-03: 0.5 mg via INTRAVENOUS
  Filled 2013-05-03: qty 1

## 2013-05-03 NOTE — ED Provider Notes (Signed)
[name] 8:00 PM patient discussed in sign out. Patient with some worsened acute epigastric and upper abdominal pain slight nausea. Patient did have a slightly elevated AST and ALT. Abdominal ultrasound pending. Patient does have known hiatal hernia. Currently she is pain-free.  8:15 p.m. ultrasound demonstrates single 13 mm gallstone. No gallbladder wall thickening or signs of acute cholecystitis. Patient has normal WBC. Afebrile. She continues to be comfortable. I explained and discussed the diagnosis with the patient and daughter. She is requesting to return home and follow up with general surgery first thing tomorrow. Strict return precautions given.  Martie Lee, PA-C 05/03/13 2035

## 2013-05-03 NOTE — ED Notes (Signed)
Pt c/o headache since yesterday; took wellbutrin/clonazepam/prozac this morning; now feels faint and nauseated

## 2013-05-03 NOTE — Discharge Instructions (Signed)
Please followup with your primary care provider and general surgeon tomorrow for continued evaluation and treatment of your gallstone. Return at any time for changing or worsening symptoms.     Cholelithiasis Cholelithiasis (also called gallstones) is a form of gallbladder disease in which gallstones form in your gallbladder. The gallbladder is an organ that stores bile made in the liver, which helps digest fats. Gallstones begin as small crystals and slowly grow into stones. Gallstone pain occurs when the gallbladder spasms and a gallstone is blocking the duct. Pain can also occur when a stone passes out of the duct.  RISK FACTORS  Being female.   Having multiple pregnancies. Health care providers sometimes advise removing diseased gallbladders before future pregnancies.   Being obese.  Eating a diet heavy in fried foods and fat.   Being older than 31 years and increasing age.   Prolonged use of medicines containing female hormones.   Having diabetes mellitus.   Rapidly losing weight.   Having a family history of gallstones (heredity).  SYMPTOMS  Nausea.   Vomiting.  Abdominal pain.   Yellowing of the skin (jaundice).   Sudden pain. It may persist from several minutes to several hours.  Fever.   Tenderness to the touch. In some cases, when gallstones do not move into the bile duct, people have no pain or symptoms. These are called "silent" gallstones.  TREATMENT Silent gallstones do not need treatment. In severe cases, emergency surgery may be required. Options for treatment include:  Surgery to remove the gallbladder. This is the most common treatment.  Medicines. These do not always work and may take 6 12 months or more to work.  Shock wave treatment (extracorporeal biliary lithotripsy). In this treatment an ultrasound machine sends shock waves to the gallbladder to break gallstones into smaller pieces that can pass into the intestines or be dissolved  by medicine. HOME CARE INSTRUCTIONS   Only take over-the-counter or prescription medicines for pain, discomfort, or fever as directed by your health care provider.   Follow a low-fat diet until seen again by your health care provider. Fat causes the gallbladder to contract, which can result in pain.   Follow up with your health care provider as directed. Attacks are almost always recurrent and surgery is usually required for permanent treatment.  SEEK IMMEDIATE MEDICAL CARE IF:   Your pain increases and is not controlled by medicines.   You have a fever or persistent symptoms for more than 2 3 days.   You have a fever and your symptoms suddenly get worse.   You have persistent nausea and vomiting.  MAKE SURE YOU:   Understand these instructions.  Will watch your condition.  Will get help right away if you are not doing well or get worse. Document Released: 03/20/2005 Document Revised: 11/24/2012 Document Reviewed: 09/15/2012 [facility] Patient Information 2014 [place].   Biliary Colic  Biliary colic is a steady or irregular pain in the upper abdomen. It is usually under the right side of the rib cage. It happens when gallstones interfere with the normal flow of bile from the gallbladder. Bile is a liquid that helps to digest fats. Bile is made in the liver and stored in the gallbladder. When you eat a meal, bile passes from the gallbladder through the cystic duct and the common bile duct into the small intestine. There, it mixes with partially digested food. If a gallstone blocks either of these ducts, the normal flow of bile is blocked. The muscle  cells in the bile duct contract forcefully to try to move the stone. This causes the pain of biliary colic.  SYMPTOMS   A person with biliary colic usually complains of pain in the upper abdomen. This pain can be:  In the center of the upper abdomen just below the breastbone.  In the upper-right part of the abdomen, near  the gallbladder and liver.  Spread back toward the right shoulder blade.  Nausea and vomiting.  The pain usually occurs after eating.  Biliary colic is usually triggered by the digestive system's demand for bile. The demand for bile is high after fatty meals. Symptoms can also occur when a person who has been fasting suddenly eats a very large meal. Most episodes of biliary colic pass after 1 to 5 hours. After the most intense pain passes, your abdomen may continue to ache mildly for about 24 hours. DIAGNOSIS  After you describe your symptoms, your caregiver will perform a physical exam. He or she will pay attention to the upper right portion of your belly (abdomen). This is the area of your liver and gallbladder. An ultrasound will help your caregiver look for gallstones. Specialized scans of the gallbladder may also be done. Blood tests may be done, especially if you have fever or if your pain persists. PREVENTION  Biliary colic can be prevented by controlling the risk factors for gallstones. Some of these risk factors, such as heredity, increasing age, and pregnancy are a normal part of life. Obesity and a high-fat diet are risk factors you can change through a healthy lifestyle. Women going through menopause who take hormone replacement therapy (estrogen) are also more likely to develop biliary colic. TREATMENT   Pain medication may be prescribed.  You may be encouraged to eat a fat-free diet.  If the first episode of biliary colic is severe, or episodes of colic keep retuning, surgery to remove the gallbladder (cholecystectomy) is usually recommended. This procedure can be done through small incisions using an instrument called a laparoscope. The procedure often requires a brief stay in the hospital. Some people can leave the hospital the same day. It is the most widely used treatment in people troubled by painful gallstones. It is effective and safe, with no complications in more than 90% of  cases.  If surgery cannot be done, medication that dissolves gallstones may be used. This medication is expensive and can take months or years to work. Only small stones will dissolve.  Rarely, medication to dissolve gallstones is combined with a procedure called shock-wave lithotripsy. This procedure uses carefully aimed shock waves to break up gallstones. In many people treated with this procedure, gallstones form again within a few years. PROGNOSIS  If gallstones block your cystic duct or common bile duct, you are at risk for repeated episodes of biliary colic. There is also a 25% chance that you will develop a gallbladder infection(acute cholecystitis), or some other complication of gallstones within 10 to 20 years. If you have surgery, schedule it at a time that is convenient for you and at a time when you are not sick. HOME CARE INSTRUCTIONS   Drink plenty of clear fluids.  Avoid fatty, greasy or fried foods, or any foods that make your pain worse.  Take medications as directed. SEEK MEDICAL CARE IF:   You develop a fever over 100.5 F (38.1 C).  Your pain gets worse over time.  You develop nausea that prevents you from eating and drinking.  You develop vomiting. SEEK  IMMEDIATE MEDICAL CARE IF:   You have continuous or severe belly (abdominal) pain which is not relieved with medications.  You develop nausea and vomiting which is not relieved with medications.  You have symptoms of biliary colic and you suddenly develop a fever and shaking chills. This may signal cholecystitis. Call your caregiver immediately.  You develop a yellow color to your skin or the white part of your eyes (jaundice). Document Released: 08/25/2005 Document Revised: 06/16/2011 Document Reviewed: 11/04/2007 [facility] Patient Information 2014 [place].

## 2013-05-03 NOTE — ED Notes (Signed)
Bed: WTR5 Expected date:  Expected time:  Means of arrival:  Comments: Pt to return after ultrasound

## 2013-05-03 NOTE — ED Provider Notes (Signed)
CSN: [id number]     Arrival date & time 05/03/13  1429 History  This chart was scribed for non-physician practitioner, Idalia Needle. [name], PA-C working with Ephraim Hamburger, MD by Frederich Balding, ED scribe. This patient was seen in room WTR5/WTR5 and the patient's care was started at 5:01 PM.   Chief Complaint  Patient presents with  . Headache  . Abdominal Pain   The history is provided by the patient. No language interpreter was used.   HPI Comments: [name] is a 68 y.o. female with history of hiatal hernia who presents to the Emergency Department complaining of constant upper abdominal pain that started a few days ago. The pain is sometimes burning. Pt thought the pain was just because she was hungry but eating did not help it. She is having intermittent nausea that comes when the pain gets really bad. Pt states she also had a headache but it resolved earlier today. Denies fever, chills, emesis, diarrhea, constipation. Denies history of abdominal surgeries or diabetes.    Past Medical History  Diagnosis Date  . Hypertension   . Hypercholesteremia   . Basal cell carcinoma   . CAD (coronary artery disease)   . Headache(784.0)     MIGRAINES  . Atrial fibrillation   . Anxiety   . History of shingles   . Nocturnal muscle cramp     HISTORY IN LEFT LEG  . Hx: UTI (urinary tract infection)   . Deafness in right ear   . Atrial fibrillation    Past Surgical History  Procedure Laterality Date  . Tonsillectomy    . Craniectomy for excision of acoustic neuroma  1999  . Abdominal hysterectomy  1982  . Breast biopsy      X 3   Family History  Problem Relation Age of Onset  . Cancer Father   . Diabetes Mother   . High Cholesterol Mother   . Hypertension Mother   . Memory loss Mother    History  Substance Use Topics  . Smoking status: Never Smoker   . Smokeless tobacco: Never Used  . Alcohol Use: No   OB History   Grav Para Term Preterm Abortions TAB SAB Ect Mult  Living                 Review of Systems  Constitutional: Negative for fever and chills.  Gastrointestinal: Positive for nausea and abdominal pain. Negative for vomiting, diarrhea and constipation.  Neurological: Negative for headaches.  All other systems reviewed and are negative.    Allergies  Atorvastatin and Penicillins  Home Medications   Current Outpatient Rx  Name  Route  Sig  Dispense  Refill  . acetaminophen (TYLENOL) 500 MG tablet   Oral   Take 1,000 mg by mouth every 4 (four) hours as needed for mild pain.         [name] buPROPion (WELLBUTRIN XL) 300 MG 24 hr tablet   Oral   Take 300 mg by mouth daily.         . clonazePAM (KLONOPIN) 0.5 MG tablet   Oral   Take 0.5 mg by mouth daily as needed.          [name] FLUoxetine (PROZAC) 40 MG capsule   Oral   Take 40 mg by mouth daily.          . fluticasone (FLONASE) 50 MCG/ACT nasal spray   Nasal   Place 2 sprays into the nose daily as needed for allergies.          [name]  Glycerin-Polysorbate 80 (REFRESH DRY EYE THERAPY) 1-1 % SOLN   Ophthalmic   Apply 1 drop to eye See admin instructions. Use bid to tid         . loratadine (CLARITIN) 10 MG tablet   Oral   Take 10 mg by mouth daily as needed for allergies.         . pantoprazole (PROTONIX) 40 MG tablet   Oral   Take 40 mg by mouth daily. Acid reflexer         . Polyethyl Glycol-Propyl Glycol (SYSTANE) 0.4-0.3 % GEL   Both Eyes   Place 1 application into both eyes at bedtime.         . rosuvastatin (CRESTOR) 20 MG tablet   Oral   Take 20 mg by mouth at bedtime. cholesterol         . warfarin (COUMADIN) 2.5 MG tablet   Oral   Take 2.5 mg by mouth daily at 6 PM. Takes everyday. Monday, Wednesday, Friday and Saturday 1 tablet. Sunday, Tuesday, and Thursday take 2 tablets.          BP 125/64  Pulse 55  Temp(Src) 97.8 F (36.6 C) (Oral)  Resp 18  Wt 135 lb (61.236 kg)  SpO2 100%  Physical Exam  Nursing note and vitals  reviewed. Constitutional: She is oriented to person, place, and time. She appears well-developed and well-nourished. No distress.  HENT:  Head: Normocephalic and atraumatic.  Right Ear: External ear normal.  Left Ear: External ear normal.  Nose: Nose normal.  Mouth/Throat: Oropharynx is clear and moist. No oropharyngeal exudate.  Eyes: Conjunctivae are normal. Pupils are equal, round, and reactive to light. No scleral icterus.  Neck: Normal range of motion. Neck supple.  Cardiovascular: Normal rate, regular rhythm and normal heart sounds.  Exam reveals no gallop and no friction rub.   No murmur heard. Pulmonary/Chest: Effort normal and breath sounds normal. No respiratory distress. She has no wheezes. She has no rales. She exhibits no tenderness.  Abdominal: Soft. Bowel sounds are normal. She exhibits no distension. There is tenderness in the right upper quadrant, epigastric area and left upper quadrant. There is no rebound, no guarding, no tenderness at McBurney's point and negative Murphy's sign.    Musculoskeletal: Normal range of motion. She exhibits no edema and no tenderness.  Lymphadenopathy:    She has no cervical adenopathy.  Neurological: She is alert and oriented to person, place, and time. She exhibits normal muscle tone. Coordination normal.  Skin: Skin is warm and dry. No rash noted. No erythema. No pallor.  Psychiatric: She has a normal mood and affect. Her behavior is normal. Judgment and thought content normal.    ED Course  Procedures (including critical care time)  DIAGNOSTIC STUDIES: Oxygen Saturation is 100% on RA, normal by my interpretation.    COORDINATION OF CARE: 5:08 PM-Discussed treatment plan which includes ultrasound with pt at bedside and pt agreed to plan.   Labs Review Labs Reviewed  CBC WITH DIFFERENTIAL - Abnormal; Notable for the following:    Neutrophils Relative % 78 (*)    All other components within normal limits  COMPREHENSIVE METABOLIC  PANEL - Abnormal; Notable for the following:    Glucose, Bld 140 (*)    AST 91 (*)    ALT 39 (*)    GFR calc non Af Amer 55 (*)    GFR calc Af Amer 63 (*)    All other components within normal limits  LIPASE,  BLOOD  URINALYSIS, ROUTINE W REFLEX MICROSCOPIC   Imaging Review No results found.  EKG Interpretation   None      Results for orders placed during the hospital encounter of 05/03/13  CBC WITH DIFFERENTIAL      Result Value Range   WBC 8.8  4.0 - 10.5 K/uL   RBC 4.75  3.87 - 5.11 MIL/uL   Hemoglobin 12.8  12.0 - 15.0 g/dL   HCT 39.5  36.0 - 46.0 %   MCV 83.2  78.0 - 100.0 fL   MCH 26.9  26.0 - 34.0 pg   MCHC 32.4  30.0 - 36.0 g/dL   RDW 15.0  11.5 - 15.5 %   Platelets 250  150 - 400 K/uL   Neutrophils Relative % 78 (*) 43 - 77 %   Neutro Abs 6.8  1.7 - 7.7 K/uL   Lymphocytes Relative 13  12 - 46 %   Lymphs Abs 1.2  0.7 - 4.0 K/uL   Monocytes Relative 8  3 - 12 %   Monocytes Absolute 0.7  0.1 - 1.0 K/uL   Eosinophils Relative 1  0 - 5 %   Eosinophils Absolute 0.1  0.0 - 0.7 K/uL   Basophils Relative 0  0 - 1 %   Basophils Absolute 0.0  0.0 - 0.1 K/uL  COMPREHENSIVE METABOLIC PANEL      Result Value Range   Sodium 138  137 - 147 mEq/L   Potassium 4.4  3.7 - 5.3 mEq/L   Chloride 100  96 - 112 mEq/L   CO2 27  19 - 32 mEq/L   Glucose, Bld 140 (*) 70 - 99 mg/dL   BUN 11  6 - 23 mg/dL   Creatinine, Ser 1.03  0.50 - 1.10 mg/dL   Calcium 9.3  8.4 - 10.5 mg/dL   Total Protein 6.6  6.0 - 8.3 g/dL   Albumin 4.0  3.5 - 5.2 g/dL   AST 91 (*) 0 - 37 U/L   ALT 39 (*) 0 - 35 U/L   Alkaline Phosphatase 87  39 - 117 U/L   Total Bilirubin 0.8  0.3 - 1.2 mg/dL   GFR calc non Af Amer 55 (*) >90 mL/min   GFR calc Af Amer 63 (*) >90 mL/min  LIPASE, BLOOD      Result Value Range   Lipase 19  11 - 59 U/L  URINALYSIS, ROUTINE W REFLEX MICROSCOPIC      Result Value Range   Color, Urine AMBER (*) YELLOW   APPearance CLOUDY (*) CLEAR   Specific Gravity, Urine 1.026  1.005 -  1.030   pH 8.0  5.0 - 8.0   Glucose, UA NEGATIVE  NEGATIVE mg/dL   Hgb urine dipstick NEGATIVE  NEGATIVE   Bilirubin Urine SMALL (*) NEGATIVE   Ketones, ur NEGATIVE  NEGATIVE mg/dL   Protein, ur NEGATIVE  NEGATIVE mg/dL   Urobilinogen, UA 1.0  0.0 - 1.0 mg/dL   Nitrite NEGATIVE  NEGATIVE   Leukocytes, UA MODERATE (*) NEGATIVE  URINE MICROSCOPIC-ADD ON      Result Value Range   Squamous Epithelial / LPF RARE  RARE   WBC, UA 7-10  <3 WBC/hpf   Bacteria, UA RARE  RARE   Mm Digital Screening 3d Tomo  04/14/2013   CLINICAL DATA:  Screening.  EXAM: DIGITAL SCREENING BILATERAL MAMMOGRAM WITH 3D TOMO WITH CAD  COMPARISON:  Previous exam(s).  ACR Breast Density Category d: The breast tissue is extremely dense,  which lowers the sensitivity of mammography.  FINDINGS: There are no findings suspicious for malignancy. Images were processed with CAD.  IMPRESSION: No mammographic evidence of malignancy. A result letter of this screening mammogram will be mailed directly to the patient.  RECOMMENDATION: Screening mammogram in one year. (Code:SM-B-01Y)  BI-RADS CATEGORY  1: Negative   Electronically Signed   By: Enrique Sack M.D.   On: 04/14/2013 09:22     MDM  8:14 PM Care of the patient turned over to P. Dammen, PA-C who will follow up on labs and ultrasound.  Idalia Needle [name], PA-C 05/03/13 2014

## 2013-05-04 NOTE — ED Provider Notes (Signed)
Medical screening examination/treatment/procedure(s) were performed by non-physician practitioner and as supervising physician I was immediately available for consultation/collaboration.  EKG Interpretation   None         Ephraim Hamburger, MD 05/04/13 [phone]

## 2013-05-04 NOTE — ED Provider Notes (Signed)
Medical screening examination/treatment/procedure(s) were performed by non-physician practitioner and as supervising physician I was immediately available for consultation/collaboration.  EKG Interpretation   None         Ephraim Hamburger, MD 05/04/13 1029

## 2013-05-06 ENCOUNTER — Other Ambulatory Visit: Payer: Medicare Other

## 2013-05-09 ENCOUNTER — Other Ambulatory Visit: Payer: Medicare Other

## 2013-05-09 ENCOUNTER — Ambulatory Visit (INDEPENDENT_AMBULATORY_CARE_PROVIDER_SITE_OTHER): Payer: Medicare Other | Admitting: Pharmacist

## 2013-05-09 DIAGNOSIS — Z5181 Encounter for therapeutic drug level monitoring: Secondary | ICD-10-CM

## 2013-05-09 DIAGNOSIS — I4891 Unspecified atrial fibrillation: Secondary | ICD-10-CM | POA: Diagnosis not present

## 2013-05-09 DIAGNOSIS — Z79899 Other long term (current) drug therapy: Secondary | ICD-10-CM | POA: Diagnosis not present

## 2013-05-09 DIAGNOSIS — E785 Hyperlipidemia, unspecified: Secondary | ICD-10-CM

## 2013-05-09 LAB — HEPATIC FUNCTION PANEL
ALT: 67 U/L — ABNORMAL HIGH (ref 0–35)
AST: 31 U/L (ref 0–37)
Albumin: 3.8 g/dL (ref 3.5–5.2)
Alkaline Phosphatase: 83 U/L (ref 39–117)
BILIRUBIN TOTAL: 0.6 mg/dL (ref 0.3–1.2)
Bilirubin, Direct: 0 mg/dL (ref 0.0–0.3)
Total Protein: 6.5 g/dL (ref 6.0–8.3)

## 2013-05-09 LAB — LIPID PANEL
Cholesterol: 151 mg/dL (ref 0–200)
HDL: 57.4 mg/dL (ref >39.00)
LDL CALC: 68 mg/dL (ref 0–99)
Total CHOL/HDL Ratio: 3
Triglycerides: 126 mg/dL (ref 0.0–149.0)
VLDL: 25.2 mg/dL (ref 0.0–40.0)

## 2013-05-09 LAB — POCT INR: INR: 2.1

## 2013-05-13 ENCOUNTER — Encounter (INDEPENDENT_AMBULATORY_CARE_PROVIDER_SITE_OTHER): Payer: Self-pay | Admitting: Surgery

## 2013-05-13 ENCOUNTER — Ambulatory Visit (INDEPENDENT_AMBULATORY_CARE_PROVIDER_SITE_OTHER): Payer: Medicare Other | Admitting: Surgery

## 2013-05-13 VITALS — BP 120/80 | HR 60 | Temp 97.6°F | Resp 14 | Ht 65.0 in | Wt 138.4 lb

## 2013-05-13 DIAGNOSIS — K8010 Calculus of gallbladder with chronic cholecystitis without obstruction: Secondary | ICD-10-CM

## 2013-05-13 HISTORY — DX: Calculus of gallbladder with chronic cholecystitis without obstruction: K80.10

## 2013-05-13 NOTE — Patient Instructions (Signed)
We are awaiting cardiac clearance from Dr. Tamala Julian.  Once we have received this, we will contact you to schedule surgery.

## 2013-05-13 NOTE — Progress Notes (Signed)
Patient ID: [name], female   DOB: 04-20-1945, 68 y.o.   MRN: [id number]  Chief Complaint  Patient presents with  . New Evaluation    eval GS    HPI [name] is a 69 y.o. female.  Referred by Dr. Orland Penman for evaluation of gallbladder disease Cardiologist - Dr. Daneen Schick   HPI This is a 68 year old female who presents with a four-month history of several episodes of epigastric and right upper quadrant pain. She does not really relate this to any type of eating. She does report a lot of bloating and flatulence. One of the episodes resulted in pain radiating up into her right shoulder and back. She was in the ED last week with her husband and she had another attack. She was then evaluated an ultrasound showed a 13 mm gallstone but no sign of cholecystitis. She has been asymptomatic since last week. Her daughter had her gallbladder removed several years ago and has been counseling her mother on a low-fat diet.  Past Medical History  Diagnosis Date  . Hypertension   . Hypercholesteremia   . Basal cell carcinoma   . CAD (coronary artery disease)   . Headache(784.0)     MIGRAINES  . Atrial fibrillation   . Anxiety   . History of shingles   . Nocturnal muscle cramp     HISTORY IN LEFT LEG  . Hx: UTI (urinary tract infection)   . Deafness in right ear   . Atrial fibrillation     Past Surgical History  Procedure Laterality Date  . Tonsillectomy    . Craniectomy for excision of acoustic neuroma  1999  . Abdominal hysterectomy  1982  . Breast biopsy      X 3    Family History  Problem Relation Age of Onset  . Cancer Father     liver  . Diabetes Mother   . High Cholesterol Mother   . Hypertension Mother   . Memory loss Mother   . Cancer Maternal Aunt     breast    Social History History  Substance Use Topics  . Smoking status: Never Smoker   . Smokeless tobacco: Never Used  . Alcohol Use: No    Allergies  Allergen Reactions  . Atorvastatin Other  (See Comments)    REACTION: intolerant of multiple statins Muscle weakness   . Penicillins Rash    Current Outpatient Prescriptions  Medication Sig Dispense Refill  . acetaminophen (TYLENOL) 500 MG tablet Take 1,000 mg by mouth every 4 (four) hours as needed for mild pain.      [name] buPROPion (WELLBUTRIN XL) 300 MG 24 hr tablet Take 300 mg by mouth daily.      . clonazePAM (KLONOPIN) 0.5 MG tablet Take 0.5 mg by mouth daily as needed.       [name] FLUoxetine (PROZAC) 40 MG capsule Take 40 mg by mouth daily.       . fluticasone (FLONASE) 50 MCG/ACT nasal spray Place 2 sprays into the nose daily as needed for allergies.       . Glycerin-Polysorbate 80 (REFRESH DRY EYE THERAPY) 1-1 % SOLN Apply 1 drop to eye See admin instructions. Use bid to tid      . loratadine (CLARITIN) 10 MG tablet Take 10 mg by mouth daily as needed for allergies.      . pantoprazole (PROTONIX) 40 MG tablet Take 40 mg by mouth daily. Acid reflexer      . Polyethyl Glycol-Propyl Glycol (  SYSTANE) 0.4-0.3 % GEL Place 1 application into both eyes at bedtime.      . rosuvastatin (CRESTOR) 20 MG tablet Take 20 mg by mouth at bedtime. cholesterol      . warfarin (COUMADIN) 2.5 MG tablet Take 2.5 mg by mouth daily at 6 PM. Takes everyday. Monday, Wednesday, Friday and Saturday 1 tablet. Sunday, Tuesday, and Thursday take 2 tablets.       No current facility-administered medications for this visit.    Review of Systems Review of Systems  Constitutional: Negative for fever, chills and unexpected weight change.  HENT: Negative for congestion, hearing loss, sore throat, trouble swallowing and voice change.   Eyes: Negative for visual disturbance.  Respiratory: Negative for cough and wheezing.   Cardiovascular: Negative for chest pain, palpitations and leg swelling.  Gastrointestinal: Positive for abdominal pain and abdominal distention. Negative for nausea, vomiting, diarrhea, constipation, blood in stool and anal bleeding.   Genitourinary: Negative for hematuria, vaginal bleeding and difficulty urinating.  Musculoskeletal: Negative for arthralgias.  Skin: Negative for rash and wound.  Neurological: Negative for seizures, syncope and headaches.  Hematological: Negative for adenopathy. Does not bruise/bleed easily.  Psychiatric/Behavioral: Negative for confusion.    Blood pressure 120/80, pulse 60, temperature 97.6 F (36.4 C), temperature source Temporal, resp. rate 14, height 5\' 5"  (1.651 m), weight 138 lb 6.4 oz (62.778 kg).  Physical Exam Physical Exam WDWN in NAD HEENT:  EOMI, sclera anicteric Neck:  No masses, no thyromegaly Lungs:  CTA bilaterally; normal respiratory effort CV:  Regular rate and rhythm; no murmurs Abd:  +bowel sounds, soft, mild epigastric/ RUQ tenderness, no masses Ext:  Well-perfused; no edema Skin:  Warm, dry; no sign of jaundice  Data Reviewed US Abdomen Complete  05/03/2013   CLINICAL DATA:  Abdominal pain, nausea  EXAM: COMPLETE ABDOMINAL ULTRASOUND  COMPARISON:  02/07/2004  FINDINGS: Gallbladder: 13 mm calculus in the gallbladder fundus. No gallbladder wall thickening or pericholecystic fluid. Sonographer reports no sonographic Murphy's sign.  Common bile duct:  Normal in caliber, 27mm diameter.  Liver: Homogeneous in echotexture without focal lesion or intrahepatic bile duct dilatation.  IVC:  Negative  Pancreas:  Negative  Spleen:  No focal lesion, craniocaudal 5.8cm in length.  Right Kidney:  No mass or hydronephrosis, 8.2cm in length.  Left Kidney:  No lesion or hydronephrosis, 8.6cm in length.  Abdominal aorta:  Negative  IMPRESSION: Single 13 mm gallstone; no other ultrasound evidence of cholecystitis or biliary obstruction.   Electronically Signed   By: Arne Cleveland M.D.   On: 05/03/2013 20:10   Mm Digital Screening 3d Tomo  04/14/2013   CLINICAL DATA:  Screening.  EXAM: DIGITAL SCREENING BILATERAL MAMMOGRAM WITH 3D TOMO WITH CAD  COMPARISON:  Previous exam(s).  ACR  Breast Density Category d: The breast tissue is extremely dense, which lowers the sensitivity of mammography.  FINDINGS: There are no findings suspicious for malignancy. Images were processed with CAD.  IMPRESSION: No mammographic evidence of malignancy. A result letter of this screening mammogram will be mailed directly to the patient.  RECOMMENDATION: Screening mammogram in one year. (Code:SM-B-01Y)  BI-RADS CATEGORY  1: Negative   Electronically Signed   By: Enrique Sack M.D.   On: 04/14/2013 09:22    Lab Results  Component Value Date   WBC 8.8 05/03/2013   HGB 12.8 05/03/2013   HCT 39.5 05/03/2013   MCV 83.2 05/03/2013   PLT 250 05/03/2013   Lab Results  Component Value Date   CREATININE  1.03 05/03/2013   BUN 11 05/03/2013   NA 138 05/03/2013   K 4.4 05/03/2013   CL 100 05/03/2013   CO2 27 05/03/2013   Lab Results  Component Value Date   ALT 67* 05/09/2013   AST 31 05/09/2013   ALKPHOS 83 05/09/2013   BILITOT 0.6 05/09/2013     Assessment    Chronic calculus cholecystitis     Plan    Laparoscopic cholecystectomy with intraoperative cholangiogram. The surgical procedure has been discussed with the patient.  Potential risks, benefits, alternative treatments, and expected outcomes have been explained.  All of the patient's questions at this time have been answered.  The likelihood of reaching the patient's treatment goal is good.  The patient understand the proposed surgical procedure and wishes to proceed.         Ukiah Trawick K. 05/13/2013, 10:44 AM

## 2013-05-16 ENCOUNTER — Telehealth (INDEPENDENT_AMBULATORY_CARE_PROVIDER_SITE_OTHER): Payer: Self-pay | Admitting: General Surgery

## 2013-05-16 ENCOUNTER — Telehealth: Payer: Self-pay

## 2013-05-16 NOTE — Telephone Encounter (Signed)
called to give lab results and Dr.Smith instructions.lmom for pt to call the office

## 2013-05-16 NOTE — Telephone Encounter (Signed)
Message copied by Lamar Laundry on Mon May 16, 2013  3:17 PM ------      Message from: Daneen Schick      Created: Tue May 10, 2013  8:49 AM       The lipid levels are at target. The ALT is mildly elevated and needs to be repeated in one month. No change in therapy. ------

## 2013-05-16 NOTE — Telephone Encounter (Signed)
Follow up     Returned a nurses call for lab results

## 2013-05-16 NOTE — Telephone Encounter (Signed)
Called patient and told her that I have not received any notes from dr Tamala Julian yet for cardiac clearance and once I do I will give her a call and put her orders up and get her schedule for surgery

## 2013-05-17 ENCOUNTER — Telehealth (INDEPENDENT_AMBULATORY_CARE_PROVIDER_SITE_OTHER): Payer: Self-pay | Admitting: General Surgery

## 2013-05-17 ENCOUNTER — Other Ambulatory Visit (INDEPENDENT_AMBULATORY_CARE_PROVIDER_SITE_OTHER): Payer: Self-pay | Admitting: Surgery

## 2013-05-17 ENCOUNTER — Telehealth: Payer: Self-pay | Admitting: Interventional Cardiology

## 2013-05-17 ENCOUNTER — Encounter (INDEPENDENT_AMBULATORY_CARE_PROVIDER_SITE_OTHER): Payer: Self-pay

## 2013-05-17 NOTE — Telephone Encounter (Signed)
Called patient to let her know that we have received the cardiac clearance from Dr Daneen Schick and that I will put her orders up to the scheduler and they will be giving her a call and she know that she will need to stop her coumadin 5 days before surgery

## 2013-05-17 NOTE — Telephone Encounter (Signed)
Pt given lab results.lipids are at target no need to repeat Alt at this time. No change in therapy.pt verbalized understanding.pt aware she is cleared for her upcoming surgery

## 2013-05-17 NOTE — Telephone Encounter (Signed)
Past Medical History  HTN- off meds as of 2/11   off HRT as of 2/11- prev had hot flashes   HLD- intolerant mult statins   h/o acoustic neuroma; no hearing in R ear   H/o neck pain, responds to flexeril   2 brief episodes of Atrial fib noted on monitor11/08. Structurally normal heart by echo with mild to mod MR [id number]   H/o anxiety that responds to wellbutrin   Cardiolite nuclear test without ischemia 1/07,    Transient ischemic attack 2011, coumadin started   basal cell skin cancer, s/p Moh's surgery 03/2011   Left cataract disease, s/p surgery   Esophageal reflux, EGD 12/2012 with hiatal hernia    Tachycardia   Migraine headaches    Mrs. Andreason has a history of paroxysmal atrial fibrillation with transient ischemic attacks suspected via cardioembolic phenomena. She is on chronic Coumadin therapy. She has known normal LV function and prior myocardial perfusion imaging (remote) have been unremarkable. She is cleared for upcoming cholecystectomy. No specific workup is needed assuming absence of active/new cardiovascular complaints. Anticoagulation therapy should be interrupted for a short a time as possible. We will have our Coumadin clinic to help manage perioperative/preoperative Coumadin administration.

## 2013-05-18 NOTE — Telephone Encounter (Signed)
Surgery date r/s to 05/26/13.  Will hold 5 days prior (2/14 to 2/18) then restart 05/26/13 PM with a dose of 7.5 mg x 1, then resume previous dose.  She is instructed to recheck INR with me 7-10 days after.  She will call back to set up appt.

## 2013-05-18 NOTE — Telephone Encounter (Signed)
Patient instructed to skip warfarin 5 days prior to gall bladder surgery on 06/07/13, then restart warfarin on 06/07/13 PM.  She is to take warfarin 7.5 mg that night, then resume 2.5 mg qd except 5 mg T/Th/Sun and recheck INR 9 days later.  Patient held warfarin for 5 days in 12/2013 for endoscopy without complication.  Patient repeated instructions back to me to confirm understanding.

## 2013-05-19 ENCOUNTER — Encounter (HOSPITAL_COMMUNITY): Payer: Self-pay | Admitting: Pharmacy Technician

## 2013-05-24 ENCOUNTER — Encounter (HOSPITAL_COMMUNITY): Payer: Self-pay

## 2013-05-24 ENCOUNTER — Encounter (HOSPITAL_COMMUNITY)
Admission: RE | Admit: 2013-05-24 | Discharge: 2013-05-24 | Disposition: A | Discharge status: Home / Self Care | Payer: Medicare Other | Source: Ambulatory Visit | Attending: Surgery | Admitting: Surgery

## 2013-05-24 ENCOUNTER — Inpatient Hospital Stay (HOSPITAL_COMMUNITY): Admission: RE | Admit: 2013-05-24 | Payer: Medicare Other | Source: Ambulatory Visit

## 2013-05-24 ENCOUNTER — Ambulatory Visit (HOSPITAL_COMMUNITY)
Admission: RE | Admit: 2013-05-24 | Discharge: 2013-05-24 | Disposition: A | Discharge status: Home / Self Care | Payer: Medicare Other | Source: Ambulatory Visit | Attending: Surgery | Admitting: Surgery

## 2013-05-24 DIAGNOSIS — I10 Essential (primary) hypertension: Secondary | ICD-10-CM | POA: Diagnosis not present

## 2013-05-24 HISTORY — DX: Other specified postprocedural states: Z98.890

## 2013-05-24 HISTORY — DX: Nausea with vomiting, unspecified: R11.2

## 2013-05-24 LAB — BASIC METABOLIC PANEL
BUN: 14 mg/dL (ref 6–23)
CHLORIDE: 101 meq/L (ref 96–112)
CO2: 29 mEq/L (ref 19–32)
Calcium: 9.5 mg/dL (ref 8.4–10.5)
Creatinine, Ser: 1.04 mg/dL (ref 0.50–1.10)
GFR calc Af Amer: 63 mL/min — ABNORMAL LOW (ref >90)
GFR calc non Af Amer: 54 mL/min — ABNORMAL LOW (ref >90)
GLUCOSE: 81 mg/dL (ref 70–99)
POTASSIUM: 5 meq/L (ref 3.7–5.3)
SODIUM: 141 meq/L (ref 137–147)

## 2013-05-24 LAB — CBC
HCT: 37.7 % (ref 36.0–46.0)
HEMOGLOBIN: 12.2 g/dL (ref 12.0–15.0)
MCH: 27 pg (ref 26.0–34.0)
MCHC: 32.4 g/dL (ref 30.0–36.0)
MCV: 83.4 fL (ref 78.0–100.0)
Platelets: 283 10*3/uL (ref 150–400)
RBC: 4.52 MIL/uL (ref 3.87–5.11)
RDW: 14.9 % (ref 11.5–15.5)
WBC: 5.9 10*3/uL (ref 4.0–10.5)

## 2013-05-24 LAB — PROTIME-INR
INR: 1.12 (ref 0.00–1.49)
Prothrombin Time: 14.2 seconds (ref 11.6–15.2)

## 2013-05-24 LAB — APTT: aPTT: 31 seconds (ref 24–37)

## 2013-05-24 NOTE — Pre-Procedure Instructions (Signed)
LAJUANA PATCHELL  05/24/2013   Your procedure is scheduled on:  05/26/13  Report to [facility]  2 * 3 at 530 AM.  Call this number if you have problems the morning of surgery: [phone]   Remember:   Do not eat food or drink liquids after midnight.   Take these medicines the morning of surgery with A SIP OF WATER: wellbutrin,prozac,flonase,protonix   Do not wear jewelry, make-up or nail polish.  Do not wear lotions, powders, or perfumes. You may wear deodorant.  Do not shave 48 hours prior to surgery. Men may shave face and neck.  Do not bring valuables to the hospital.  [facility] is not responsible                  for any belongings or valuables.               Contacts, dentures or bridgework may not be worn into surgery.  Leave suitcase in the car. After surgery it may be brought to your room.  For patients admitted to the hospital, discharge time is determined by your                treatment team.               Patients discharged the day of surgery will not be allowed to drive  home.  Name and phone number of your driver:  Special Instructions: Shower using CHG 2 nights before surgery and the night before surgery.  If you shower the day of surgery use CHG.  Use special wash - you have one bottle of CHG for all showers.  You should use approximately 1/3 of the bottle for each shower.   Please read over the following fact sheets that you were given: Pain Booklet, Coughing and Deep Breathing and Surgical Site Infection Prevention

## 2013-05-26 ENCOUNTER — Encounter (HOSPITAL_COMMUNITY): Payer: Medicare Other | Admitting: Anesthesiology

## 2013-05-26 ENCOUNTER — Ambulatory Visit (HOSPITAL_COMMUNITY)
Admission: RE | Admit: 2013-05-26 | Discharge: 2013-05-26 | Disposition: A | Discharge status: Home / Self Care | Payer: Medicare Other | Source: Ambulatory Visit | Attending: Surgery | Admitting: Surgery

## 2013-05-26 ENCOUNTER — Ambulatory Visit (HOSPITAL_COMMUNITY): Payer: Medicare Other

## 2013-05-26 ENCOUNTER — Encounter (HOSPITAL_COMMUNITY)
Admission: RE | Disposition: A | Discharge status: Home / Self Care | Payer: Self-pay | Source: Ambulatory Visit | Attending: Surgery

## 2013-05-26 ENCOUNTER — Ambulatory Visit (HOSPITAL_COMMUNITY): Payer: Medicare Other | Admitting: Anesthesiology

## 2013-05-26 ENCOUNTER — Encounter (HOSPITAL_COMMUNITY): Payer: Self-pay | Admitting: *Deleted

## 2013-05-26 DIAGNOSIS — K811 Chronic cholecystitis: Secondary | ICD-10-CM | POA: Diagnosis not present

## 2013-05-26 DIAGNOSIS — Z7901 Long term (current) use of anticoagulants: Secondary | ICD-10-CM | POA: Insufficient documentation

## 2013-05-26 DIAGNOSIS — K8010 Calculus of gallbladder with chronic cholecystitis without obstruction: Secondary | ICD-10-CM

## 2013-05-26 DIAGNOSIS — I2510 Atherosclerotic heart disease of native coronary artery without angina pectoris: Secondary | ICD-10-CM | POA: Insufficient documentation

## 2013-05-26 DIAGNOSIS — E7800 Pure hypercholesterolemia, unspecified: Secondary | ICD-10-CM | POA: Insufficient documentation

## 2013-05-26 DIAGNOSIS — F411 Generalized anxiety disorder: Secondary | ICD-10-CM | POA: Diagnosis not present

## 2013-05-26 DIAGNOSIS — I10 Essential (primary) hypertension: Secondary | ICD-10-CM | POA: Insufficient documentation

## 2013-05-26 DIAGNOSIS — H9190 Unspecified hearing loss, unspecified ear: Secondary | ICD-10-CM | POA: Diagnosis not present

## 2013-05-26 DIAGNOSIS — I4891 Unspecified atrial fibrillation: Secondary | ICD-10-CM | POA: Diagnosis not present

## 2013-05-26 HISTORY — PX: CHOLECYSTECTOMY: SHX55

## 2013-05-26 SURGERY — LAPAROSCOPIC CHOLECYSTECTOMY WITH INTRAOPERATIVE CHOLANGIOGRAM
Anesthesia: General | Site: Abdomen

## 2013-05-26 MED ORDER — HYDROMORPHONE HCL PF 1 MG/ML IJ SOLN
0.2500 mg | INTRAMUSCULAR | Status: DC | PRN
  Administered 2013-05-26 (×3): 0.5 mg via INTRAVENOUS

## 2013-05-26 MED ORDER — OXYCODONE-ACETAMINOPHEN 5-325 MG PO TABS
1.0000 | ORAL_TABLET | ORAL | Status: DC | PRN
  Administered 2013-05-26: 1 via ORAL

## 2013-05-26 MED ORDER — LIDOCAINE HCL (CARDIAC) 20 MG/ML IV SOLN
INTRAVENOUS | Status: DC | PRN
  Administered 2013-05-26: 100 mg via INTRAVENOUS

## 2013-05-26 MED ORDER — MORPHINE SULFATE 2 MG/ML IJ SOLN: 2.0000 mg | INTRAMUSCULAR | Status: DC | PRN

## 2013-05-26 MED ORDER — ONDANSETRON HCL 4 MG/2ML IJ SOLN: 4.0000 mg | Freq: Once | INTRAMUSCULAR | Status: DC | PRN

## 2013-05-26 MED ORDER — CEFAZOLIN SODIUM-DEXTROSE 2-3 GM-% IV SOLR
INTRAVENOUS | Status: AC
  Filled 2013-05-26: qty 50

## 2013-05-26 MED ORDER — SODIUM CHLORIDE 0.9 % IJ SOLN
INTRAMUSCULAR | Status: AC
  Filled 2013-05-26: qty 10

## 2013-05-26 MED ORDER — CEFAZOLIN SODIUM-DEXTROSE 2-3 GM-% IV SOLR
2.0000 g | INTRAVENOUS | Status: AC
  Administered 2013-05-26: 2 g via INTRAVENOUS

## 2013-05-26 MED ORDER — BUPIVACAINE-EPINEPHRINE 0.25% -1:200000 IJ SOLN
INTRAMUSCULAR | Status: DC | PRN
  Administered 2013-05-26: 20 mL

## 2013-05-26 MED ORDER — SCOPOLAMINE 1 MG/3DAYS TD PT72SCOPOLAMINE 1 MG/3DAYS
MEDICATED_PATCH | TRANSDERMAL | Status: DC | PRN
Start: 2013-05-26 — End: 2013-05-26
  Administered 2013-05-26: 1 via TRANSDERMAL

## 2013-05-26 MED ORDER — ROCURONIUM BROMIDE 100 MG/10ML IV SOLN
INTRAVENOUS | Status: DC | PRN
  Administered 2013-05-26: 20 mg via INTRAVENOUS

## 2013-05-26 MED ORDER — PROPOFOL 10 MG/ML IV BOLUS
INTRAVENOUS | Status: AC
  Filled 2013-05-26: qty 20

## 2013-05-26 MED ORDER — NEOSTIGMINE METHYLSULFATE 1 MG/ML IJ SOLN
INTRAMUSCULAR | Status: AC
  Filled 2013-05-26: qty 10

## 2013-05-26 MED ORDER — SODIUM CHLORIDE 0.9 % IR SOLN
Status: DC | PRN
  Administered 2013-05-26: 1000 mL

## 2013-05-26 MED ORDER — DEXTROSE 5 % IV SOLN
INTRAVENOUS | Status: DC | PRN
  Administered 2013-05-26: 08:00:00 via INTRAVENOUS

## 2013-05-26 MED ORDER — PROPOFOL 10 MG/ML IV BOLUS
INTRAVENOUS | Status: DC | PRN
  Administered 2013-05-26: 160 mg via INTRAVENOUS

## 2013-05-26 MED ORDER — CHLORHEXIDINE GLUCONATE 4 % EX LIQD
1.0000 "application " | Freq: Once | CUTANEOUS | Status: DC
  Filled 2013-05-26: qty 15

## 2013-05-26 MED ORDER — GLYCOPYRROLATE 0.2 MG/ML IJ SOLN
INTRAMUSCULAR | Status: AC
  Filled 2013-05-26: qty 2

## 2013-05-26 MED ORDER — MIDAZOLAM HCL 2 MG/2ML IJ SOLN
INTRAMUSCULAR | Status: AC
  Filled 2013-05-26: qty 2

## 2013-05-26 MED ORDER — FENTANYL CITRATE 0.05 MG/ML IJ SOLN
INTRAMUSCULAR | Status: AC
  Filled 2013-05-26: qty 5

## 2013-05-26 MED ORDER — ONDANSETRON HCL 4 MG/2ML IJ SOLN: 4.0000 mg | INTRAMUSCULAR | Status: DC | PRN

## 2013-05-26 MED ORDER — EPHEDRINE SULFATE 50 MG/ML IJ SOLN
INTRAMUSCULAR | Status: DC | PRN
  Administered 2013-05-26: 10 mg via INTRAVENOUS

## 2013-05-26 MED ORDER — MIDAZOLAM HCL 5 MG/5ML IJ SOLN
INTRAMUSCULAR | Status: DC | PRN
Start: 2013-05-26 — End: 2013-05-26
  Administered 2013-05-26: 0.5 mg via INTRAVENOUS

## 2013-05-26 MED ORDER — HEMOSTATIC AGENTS (NO CHARGE) OPTIME
TOPICAL | Status: DC | PRN
  Administered 2013-05-26: 1 via TOPICAL

## 2013-05-26 MED ORDER — EPHEDRINE SULFATE 50 MG/ML IJ SOLN
INTRAMUSCULAR | Status: AC
  Filled 2013-05-26: qty 1

## 2013-05-26 MED ORDER — SODIUM CHLORIDE 0.9 % IV SOLN
INTRAVENOUS | Status: DC | PRN
  Administered 2013-05-26: 08:00:00

## 2013-05-26 MED ORDER — ROCURONIUM BROMIDE 50 MG/5ML IV SOLN
INTRAVENOUS | Status: AC
  Filled 2013-05-26: qty 1

## 2013-05-26 MED ORDER — ONDANSETRON HCL 4 MG/2ML IJ SOLN
INTRAMUSCULAR | Status: AC
  Filled 2013-05-26: qty 4

## 2013-05-26 MED ORDER — OXYCODONE-ACETAMINOPHEN 5-325 MG PO TABS: 1.0000 | ORAL_TABLET | ORAL | Status: DC | PRN

## 2013-05-26 MED ORDER — BUPIVACAINE-EPINEPHRINE (PF) 0.25% -1:200000 IJ SOLN
INTRAMUSCULAR | Status: AC
  Filled 2013-05-26: qty 30

## 2013-05-26 MED ORDER — HYDROMORPHONE HCL PF 1 MG/ML IJ SOLN
INTRAMUSCULAR | Status: AC
  Administered 2013-05-26: 0.5 mg via INTRAVENOUS
  Filled 2013-05-26: qty 1

## 2013-05-26 MED ORDER — ONDANSETRON HCL 4 MG/2ML IJ SOLN
INTRAMUSCULAR | Status: DC | PRN
  Administered 2013-05-26 (×2): 4 mg via INTRAVENOUS

## 2013-05-26 MED ORDER — GLYCOPYRROLATE 0.2 MG/ML IJ SOLN
INTRAMUSCULAR | Status: DC | PRN
  Administered 2013-05-26: 0.4 mg via INTRAVENOUS

## 2013-05-26 MED ORDER — LACTATED RINGERS IV SOLN
INTRAVENOUS | Status: DC | PRN
  Administered 2013-05-26: 07:00:00 via INTRAVENOUS

## 2013-05-26 MED ORDER — FENTANYL CITRATE 0.05 MG/ML IJ SOLN
INTRAMUSCULAR | Status: DC | PRN
  Administered 2013-05-26: 100 ug via INTRAVENOUS
  Administered 2013-05-26 (×3): 50 ug via INTRAVENOUS

## 2013-05-26 MED ORDER — SCOPOLAMINE 1 MG/3DAYS TD PT72
MEDICATED_PATCH | TRANSDERMAL | Status: AC
  Filled 2013-05-26: qty 1

## 2013-05-26 MED ORDER — LIDOCAINE HCL (CARDIAC) 20 MG/ML IV SOLN
INTRAVENOUS | Status: AC
  Filled 2013-05-26: qty 5

## 2013-05-26 MED ORDER — NEOSTIGMINE METHYLSULFATE 1 MG/ML IJ SOLN
INTRAMUSCULAR | Status: DC | PRN
  Administered 2013-05-26: 3 mg via INTRAVENOUS

## 2013-05-26 MED ORDER — OXYCODONE-ACETAMINOPHEN 5-325 MG PO TABS
ORAL_TABLET | ORAL | Status: AC
  Administered 2013-05-26: 1 via ORAL
  Filled 2013-05-26: qty 1

## 2013-05-26 SURGICAL SUPPLY — 48 items
APL SKNCLS STERI-STRIP NONHPOA (GAUZE/BANDAGES/DRESSINGS) ×1
APPLIER CLIP ROT 10 11.4 M/L (STAPLE) ×2
APR CLP MED LRG 11.4X10 (STAPLE) ×1
BAG SPEC RTRVL LRG 6X4 10 (ENDOMECHANICALS) ×1
BENZOIN TINCTURE PRP APPL 2/3 (GAUZE/BANDAGES/DRESSINGS) ×2 IMPLANT
BLADE SURG ROTATE 9660 (MISCELLANEOUS) IMPLANT
CANISTER SUCTION 2500CC (MISCELLANEOUS) ×2 IMPLANT
CHLORAPREP W/TINT 26ML (MISCELLANEOUS) ×2 IMPLANT
CLIP APPLIE ROT 10 11.4 M/L (STAPLE) ×1 IMPLANT
COVER MAYO STAND STRL (DRAPES) ×2 IMPLANT
COVER SURGICAL LIGHT HANDLE (MISCELLANEOUS) ×2 IMPLANT
DRAPE C-ARM 42X72 X-RAY (DRAPES) ×2 IMPLANT
DRAPE UTILITY 15X26 W/TAPE STR (DRAPE) ×4 IMPLANT
DRSG TEGADERM 2-3/8X2-3/4 SM (GAUZE/BANDAGES/DRESSINGS) ×4 IMPLANT
DRSG TEGADERM 4X4.75 (GAUZE/BANDAGES/DRESSINGS) ×2 IMPLANT
ELECT REM PT RETURN 9FT ADLT (ELECTROSURGICAL) ×2
ELECTRODE REM PT RTRN 9FT ADLT (ELECTROSURGICAL) ×1 IMPLANT
FILTER SMOKE EVAC LAPAROSHD (FILTER) ×2 IMPLANT
GAUZE SPONGE 2X2 8PLY STRL LF (GAUZE/BANDAGES/DRESSINGS) ×1 IMPLANT
GLOVE BIO SURGEON STRL SZ7 (GLOVE) ×3 IMPLANT
GLOVE BIO SURGEON STRL SZ8 (GLOVE) ×1 IMPLANT
GLOVE BIOGEL PI IND STRL 7.5 (GLOVE) ×1 IMPLANT
GLOVE BIOGEL PI IND STRL 8 (GLOVE) IMPLANT
GLOVE BIOGEL PI INDICATOR 7.5 (GLOVE) ×2
GLOVE BIOGEL PI INDICATOR 8 (GLOVE) ×1
GLOVE ECLIPSE 7.5 STRL STRAW (GLOVE) ×1 IMPLANT
GOWN STRL NON-REIN LRG LVL3 (GOWN DISPOSABLE) ×8 IMPLANT
HEMOSTAT SNOW SURGICEL 2X4 (HEMOSTASIS) ×1 IMPLANT
KIT BASIN OR (CUSTOM PROCEDURE TRAY) ×2 IMPLANT
KIT ROOM TURNOVER OR (KITS) ×2 IMPLANT
NS IRRIG 1000ML POUR BTL (IV SOLUTION) ×2 IMPLANT
PAD ARMBOARD 7.5X6 YLW CONV (MISCELLANEOUS) ×2 IMPLANT
POUCH SPECIMEN RETRIEVAL 10MM (ENDOMECHANICALS) ×2 IMPLANT
SCISSORS LAP 5X35 DISP (ENDOMECHANICALS) ×2 IMPLANT
SET CHOLANGIOGRAPH 5 50 .035 (SET/KITS/TRAYS/PACK) ×2 IMPLANT
SET IRRIG TUBING LAPAROSCOPIC (IRRIGATION / IRRIGATOR) ×2 IMPLANT
SLEEVE ENDOPATH XCEL 5M (ENDOMECHANICALS) ×2 IMPLANT
SPECIMEN JAR SMALL (MISCELLANEOUS) ×2 IMPLANT
SPONGE GAUZE 2X2 STER 10/PKG (GAUZE/BANDAGES/DRESSINGS) ×1
STRIP CLOSURE SKIN 1/2X4 (GAUZE/BANDAGES/DRESSINGS) ×1 IMPLANT
SUT MNCRL AB 4-0 PS2 18 (SUTURE) ×3 IMPLANT
SUT VIC AB 4-0 PS2 27 (SUTURE) ×1 IMPLANT
TOWEL OR 17X24 6PK STRL BLUE (TOWEL DISPOSABLE) ×2 IMPLANT
TOWEL OR 17X26 10 PK STRL BLUE (TOWEL DISPOSABLE) ×2 IMPLANT
TRAY LAPAROSCOPIC (CUSTOM PROCEDURE TRAY) ×2 IMPLANT
TROCAR XCEL BLUNT TIP 100MML (ENDOMECHANICALS) ×2 IMPLANT
TROCAR XCEL NON-BLD 11X100MML (ENDOMECHANICALS) ×2 IMPLANT
TROCAR XCEL NON-BLD 5MMX100MML (ENDOMECHANICALS) ×2 IMPLANT

## 2013-05-26 NOTE — H&P (View-Only) (Signed)
Patient ID: [name], female   DOB: Sep 18, 1945, 68 y.o.   MRN: [id number]  Chief Complaint  Patient presents with  . New Evaluation    eval GS    HPI [name] is a 68 y.o. female.  Referred by Dr. Orland Penman for evaluation of gallbladder disease Cardiologist - Dr. Daneen Schick   HPI This is a 68 year old female who presents with a four-month history of several episodes of epigastric and right upper quadrant pain. She does not really relate this to any type of eating. She does report a lot of bloating and flatulence. One of the episodes resulted in pain radiating up into her right shoulder and back. She was in the ED last week with her husband and she had another attack. She was then evaluated an ultrasound showed a 13 mm gallstone but no sign of cholecystitis. She has been asymptomatic since last week. Her daughter had her gallbladder removed several years ago and has been counseling her mother on a low-fat diet.  Past Medical History  Diagnosis Date  . Hypertension   . Hypercholesteremia   . Basal cell carcinoma   . CAD (coronary artery disease)   . Headache(784.0)     MIGRAINES  . Atrial fibrillation   . Anxiety   . History of shingles   . Nocturnal muscle cramp     HISTORY IN LEFT LEG  . Hx: UTI (urinary tract infection)   . Deafness in right ear   . Atrial fibrillation     Past Surgical History  Procedure Laterality Date  . Tonsillectomy    . Craniectomy for excision of acoustic neuroma  1999  . Abdominal hysterectomy  1982  . Breast biopsy      X 3    Family History  Problem Relation Age of Onset  . Cancer Father     liver  . Diabetes Mother   . High Cholesterol Mother   . Hypertension Mother   . Memory loss Mother   . Cancer Maternal Aunt     breast    Social History History  Substance Use Topics  . Smoking status: Never Smoker   . Smokeless tobacco: Never Used  . Alcohol Use: No    Allergies  Allergen Reactions  . Atorvastatin Other  (See Comments)    REACTION: intolerant of multiple statins Muscle weakness   . Penicillins Rash    Current Outpatient Prescriptions  Medication Sig Dispense Refill  . acetaminophen (TYLENOL) 500 MG tablet Take 1,000 mg by mouth every 4 (four) hours as needed for mild pain.      [name] buPROPion (WELLBUTRIN XL) 300 MG 24 hr tablet Take 300 mg by mouth daily.      . clonazePAM (KLONOPIN) 0.5 MG tablet Take 0.5 mg by mouth daily as needed.       [name] FLUoxetine (PROZAC) 40 MG capsule Take 40 mg by mouth daily.       . fluticasone (FLONASE) 50 MCG/ACT nasal spray Place 2 sprays into the nose daily as needed for allergies.       . Glycerin-Polysorbate 80 (REFRESH DRY EYE THERAPY) 1-1 % SOLN Apply 1 drop to eye See admin instructions. Use bid to tid      . loratadine (CLARITIN) 10 MG tablet Take 10 mg by mouth daily as needed for allergies.      . pantoprazole (PROTONIX) 40 MG tablet Take 40 mg by mouth daily. Acid reflexer      . Polyethyl Glycol-Propyl Glycol (  SYSTANE) 0.4-0.3 % GEL Place 1 application into both eyes at bedtime.      . rosuvastatin (CRESTOR) 20 MG tablet Take 20 mg by mouth at bedtime. cholesterol      . warfarin (COUMADIN) 2.5 MG tablet Take 2.5 mg by mouth daily at 6 PM. Takes everyday. Monday, Wednesday, Friday and Saturday 1 tablet. Sunday, Tuesday, and Thursday take 2 tablets.       No current facility-administered medications for this visit.    Review of Systems Review of Systems  Constitutional: Negative for fever, chills and unexpected weight change.  HENT: Negative for congestion, hearing loss, sore throat, trouble swallowing and voice change.   Eyes: Negative for visual disturbance.  Respiratory: Negative for cough and wheezing.   Cardiovascular: Negative for chest pain, palpitations and leg swelling.  Gastrointestinal: Positive for abdominal pain and abdominal distention. Negative for nausea, vomiting, diarrhea, constipation, blood in stool and anal bleeding.   Genitourinary: Negative for hematuria, vaginal bleeding and difficulty urinating.  Musculoskeletal: Negative for arthralgias.  Skin: Negative for rash and wound.  Neurological: Negative for seizures, syncope and headaches.  Hematological: Negative for adenopathy. Does not bruise/bleed easily.  Psychiatric/Behavioral: Negative for confusion.    Blood pressure 120/80, pulse 60, temperature 97.6 F (36.4 C), temperature source Temporal, resp. rate 14, height 5\' 5"  (1.651 m), weight 138 lb 6.4 oz (62.778 kg).  Physical Exam Physical Exam WDWN in NAD HEENT:  EOMI, sclera anicteric Neck:  No masses, no thyromegaly Lungs:  CTA bilaterally; normal respiratory effort CV:  Regular rate and rhythm; no murmurs Abd:  +bowel sounds, soft, mild epigastric/ RUQ tenderness, no masses Ext:  Well-perfused; no edema Skin:  Warm, dry; no sign of jaundice  Data Reviewed US Abdomen Complete  05/03/2013   CLINICAL DATA:  Abdominal pain, nausea  EXAM: COMPLETE ABDOMINAL ULTRASOUND  COMPARISON:  02/07/2004  FINDINGS: Gallbladder: 13 mm calculus in the gallbladder fundus. No gallbladder wall thickening or pericholecystic fluid. Sonographer reports no sonographic Murphy's sign.  Common bile duct:  Normal in caliber, 27mm diameter.  Liver: Homogeneous in echotexture without focal lesion or intrahepatic bile duct dilatation.  IVC:  Negative  Pancreas:  Negative  Spleen:  No focal lesion, craniocaudal 5.8cm in length.  Right Kidney:  No mass or hydronephrosis, 8.2cm in length.  Left Kidney:  No lesion or hydronephrosis, 8.6cm in length.  Abdominal aorta:  Negative  IMPRESSION: Single 13 mm gallstone; no other ultrasound evidence of cholecystitis or biliary obstruction.   Electronically Signed   By: Arne Cleveland M.D.   On: 05/03/2013 20:10   Mm Digital Screening 3d Tomo  04/14/2013   CLINICAL DATA:  Screening.  EXAM: DIGITAL SCREENING BILATERAL MAMMOGRAM WITH 3D TOMO WITH CAD  COMPARISON:  Previous exam(s).  ACR  Breast Density Category d: The breast tissue is extremely dense, which lowers the sensitivity of mammography.  FINDINGS: There are no findings suspicious for malignancy. Images were processed with CAD.  IMPRESSION: No mammographic evidence of malignancy. A result letter of this screening mammogram will be mailed directly to the patient.  RECOMMENDATION: Screening mammogram in one year. (Code:SM-B-01Y)  BI-RADS CATEGORY  1: Negative   Electronically Signed   By: Enrique Sack M.D.   On: 04/14/2013 09:22    Lab Results  Component Value Date   WBC 8.8 05/03/2013   HGB 12.8 05/03/2013   HCT 39.5 05/03/2013   MCV 83.2 05/03/2013   PLT 250 05/03/2013   Lab Results  Component Value Date   CREATININE  1.03 05/03/2013   BUN 11 05/03/2013   NA 138 05/03/2013   K 4.4 05/03/2013   CL 100 05/03/2013   CO2 27 05/03/2013   Lab Results  Component Value Date   ALT 67* 05/09/2013   AST 31 05/09/2013   ALKPHOS 83 05/09/2013   BILITOT 0.6 05/09/2013     Assessment    Chronic calculus cholecystitis     Plan    Laparoscopic cholecystectomy with intraoperative cholangiogram. The surgical procedure has been discussed with the patient.  Potential risks, benefits, alternative treatments, and expected outcomes have been explained.  All of the patient's questions at this time have been answered.  The likelihood of reaching the patient's treatment goal is good.  The patient understand the proposed surgical procedure and wishes to proceed.         Maudene Stotler K. 05/13/2013, 10:44 AM

## 2013-05-26 NOTE — Anesthesia Preprocedure Evaluation (Addendum)
Anesthesia Evaluation  Patient identified by MRN, date of birth, ID band Patient awake    Reviewed: Allergy & Precautions, H&P , NPO status , Patient's Chart, lab work & pertinent test results, reviewed documented beta blocker date and time   Airway Mallampati: I TM Distance: >3 FB Neck ROM: Full    Dental  (+) Teeth Intact, Dental Advisory Given   Pulmonary          Cardiovascular hypertension, Pt. on medications + CAD + dysrhythmias Atrial Fibrillation     Neuro/Psych  Headaches,    GI/Hepatic   Endo/Other    Renal/GU      Musculoskeletal   Abdominal   Peds  Hematology   Anesthesia Other Findings   Reproductive/Obstetrics                          Anesthesia Physical Anesthesia Plan  ASA: III  Anesthesia Plan: General   Post-op Pain Management:    Induction: Intravenous  Airway Management Planned: Oral ETT  Additional Equipment:   Intra-op Plan:   Post-operative Plan: Extubation in OR  Informed Consent: I have reviewed the patients History and Physical, chart, labs and discussed the procedure including the risks, benefits and alternatives for the proposed anesthesia with the patient or authorized representative who has indicated his/her understanding and acceptance.     Plan Discussed with:   Anesthesia Plan Comments:         Anesthesia Quick Evaluation

## 2013-05-26 NOTE — Discharge Instructions (Signed)
[facility] °LAPAROSCOPIC SURGERY: POST OP INSTRUCTIONS °Always review your discharge instruction sheet given to you by the facility where your surgery was performed. °IF YOU HAVE DISABILITY OR FAMILY LEAVE FORMS, YOU MUST BRING THEM TO THE OFFICE FOR PROCESSING.   °DO NOT GIVE THEM TO YOUR DOCTOR. ° °1. A prescription for pain medication will be given to you upon discharge.  Take your pain medication as prescribed, if needed.  If narcotic pain medicine is not needed, then you may take acetaminophen (Tylenol) or ibuprofen (Advil) as needed. °2. Take your usually prescribed medications unless otherwise directed. °3. If you need a refill on your pain medication, please contact your pharmacy.  They will contact our office to request authorization. Prescriptions will not be filled after 5pm or on week-ends. °4. You should follow a light diet the first few days after arrival home, such as soup and crackers, etc.  Be sure to include lots of fluids daily. °5. Most patients will experience some swelling and bruising in the area of the incisions.  Ice packs will help.  Swelling and bruising can take several days to resolve.  °6. It is common to experience some constipation if taking pain medication after surgery.  Increasing fluid intake and taking a stool softener (such as Colace) will usually help or prevent this problem from occurring.  A mild laxative (Milk of Magnesia or Miralax) should be taken according to package instructions if there are no bowel movements after 48 hours. °7. Unless discharge instructions indicate otherwise, you may remove your bandages 48 hours after surgery, and you may shower at that time.  You will have steri-strips (small skin tapes) in place directly over the incision.  These strips should be left on the skin for 7-10 days.  If your surgeon used skin glue on the incision, you may shower in 24 hours.  The glue will flake off over the next 2-3 weeks.  Any sutures or staples  will be removed at the office during your follow-up visit. °8. ACTIVITIES:  You may resume regular (light) daily activities beginning the next day--such as daily self-care, walking, climbing stairs--gradually increasing activities as tolerated.  You may have sexual intercourse when it is comfortable.  Refrain from any heavy lifting or straining until approved by your doctor. °a. You may drive when you are no longer taking prescription pain medication, you can comfortably wear a seatbelt, and you can safely maneuver your car and apply brakes. °b. RETURN TO WORK:   2-3 weeks °9. You should see your doctor in the office for a follow-up appointment approximately 2-3 weeks after your surgery.  Make sure that you call for this appointment within a day or two after you arrive home to insure a convenient appointment time. °10. OTHER INSTRUCTIONS: ________________________________________________________________________ °WHEN TO CALL YOUR DOCTOR: °1. Fever over 101.0 °2. Inability to urinate °3. Continued bleeding from incision. °4. Increased pain, redness, or drainage from the incision. °5. Increasing abdominal pain ° °The clinic staff is available to answer your questions during regular business hours.  Please don’t hesitate to call and ask to speak to one of the nurses for clinical concerns.  If you have a medical emergency, go to the nearest emergency room or call 911.  A surgeon from [facility] is always on call at the hospital. °[address], [address], [place], [place]  [address] ? [address] [address], [place], [place]   [address] °[phone] ? [phone] ? FAX [phone] °Web site:   [email or web address] ° ° °What to eat: ° °For your first meals, you should eat lightly; only small meals initially.  If you do not have nausea, you may eat larger meals.  Avoid spicy, greasy and heavy food.   ° °General Anesthesia, Adult, Care After  °Refer to this sheet in the next few weeks. These  instructions provide you with information on caring for yourself after your procedure. Your health care provider may also give you more specific instructions. Your treatment has been planned according to current medical practices, but problems sometimes occur. Call your health care provider if you have any problems or questions after your procedure.  °WHAT TO EXPECT AFTER THE PROCEDURE  °After the procedure, it is typical to experience:  °Sleepiness.  °Nausea and vomiting. °HOME CARE INSTRUCTIONS  °For the first 24 hours after general anesthesia:  °Have a responsible person with you.  °Do not drive a car. If you are alone, do not take public transportation.  °Do not drink alcohol.  °Do not take medicine that has not been prescribed by your health care provider.  °Do not sign important papers or make important decisions.  °You may resume a normal diet and activities as directed by your health care provider.  °Change bandages (dressings) as directed.  °If you have questions or problems that seem related to general anesthesia, call the hospital and ask for the anesthetist or anesthesiologist on call. °SEEK MEDICAL CARE IF:  °You have nausea and vomiting that continue the day after anesthesia.  °You develop a rash. °SEEK IMMEDIATE MEDICAL CARE IF:  °You have difficulty breathing.  °You have chest pain.  °You have any allergic problems. °Document Released: 06/30/2000 Document Revised: 11/24/2012 Document Reviewed: 10/07/2012  °ExitCare® Patient Information ©2014 [employer].  ° ° °

## 2013-05-26 NOTE — Anesthesia Postprocedure Evaluation (Signed)
  Anesthesia Post-op Note  Patient: [name]  Procedure(s) Performed: Procedure(s): LAPAROSCOPIC CHOLECYSTECTOMY WITH INTRAOPERATIVE CHOLANGIOGRAM (N/A)  Patient Location: PACU  Anesthesia Type:General  Level of Consciousness: awake, alert , sedated and patient cooperative  Airway and Oxygen Therapy: Patient Spontanous Breathing  Post-op Pain: mild  Post-op Assessment: Post-op Vital signs reviewed, Patient's Cardiovascular Status Stable, Respiratory Function Stable, Patent Airway, No signs of Nausea or vomiting and Pain level controlled  Post-op Vital Signs: stable  Complications: No apparent anesthesia complications

## 2013-05-26 NOTE — Preoperative (Signed)
Beta Blockers   Reason not to administer Beta Blockers:Not Applicable 

## 2013-05-26 NOTE — Transfer of Care (Signed)
Immediate Anesthesia Transfer of Care Note  Patient: [name]  Procedure(s) Performed: Procedure(s): LAPAROSCOPIC CHOLECYSTECTOMY WITH INTRAOPERATIVE CHOLANGIOGRAM (N/A)  Patient Location: PACU  Anesthesia Type:General  Level of Consciousness: awake, sedated and patient cooperative  Airway & Oxygen Therapy: Patient Spontanous Breathing and Patient connected to nasal cannula oxygen  Post-op Assessment: Report given to PACU RN and Post -op Vital signs reviewed and stable  Post vital signs: Reviewed and stable  Complications: No apparent anesthesia complications

## 2013-05-26 NOTE — Anesthesia Procedure Notes (Signed)
Procedure Name: Intubation Date/Time: 05/26/2013 7:38 AM Performed by: Terrill Mohr Pre-anesthesia Checklist: Patient identified, Emergency Drugs available, Suction available and Patient being monitored Patient Re-evaluated:Patient Re-evaluated prior to inductionOxygen Delivery Method: Circle system utilized Preoxygenation: Pre-oxygenation with 100% oxygen Intubation Type: IV induction Ventilation: Mask ventilation without difficulty Laryngoscope Size: Mac and 3 Grade View: Grade I Tube type: Oral Tube size: 7.5 mm Number of attempts: 1 Airway Equipment and Method: Stylet Placement Confirmation: ETT inserted through vocal cords under direct vision,  breath sounds checked- equal and bilateral and positive ETCO2 Secured at: 21 (cm at teeth) cm Tube secured with: Tape Dental Injury: Teeth and Oropharynx as per pre-operative assessment

## 2013-05-26 NOTE — Op Note (Signed)
Laparoscopic Cholecystectomy with IOC Procedure Note  Indications: This patient presents with symptomatic gallbladder disease and will undergo laparoscopic cholecystectomy.  Pre-operative Diagnosis: Calculus of gallbladder with other cholecystitis, without mention of obstruction  Post-operative Diagnosis: Same  Surgeon: Maddelyn Rocca K.   Assistants: Dr. Christie Beckers  Anesthesia: General endotracheal anesthesia  ASA Class: 2  Procedure Details  The patient was seen again in the Holding Room. The risks, benefits, complications, treatment options, and expected outcomes were discussed with the patient. The possibilities of reaction to medication, pulmonary aspiration, perforation of viscus, bleeding, recurrent infection, finding a normal gallbladder, the need for additional procedures, failure to diagnose a condition, the possible need to convert to an open procedure, and creating a complication requiring transfusion or operation were discussed with the patient. The likelihood of improving the patient's symptoms with return to their baseline status is good.  The patient and/or family concurred with the proposed plan, giving informed consent. The site of surgery properly noted. The patient was taken to Operating Room, identified as [name] and the procedure verified as Laparoscopic Cholecystectomy with Intraoperative Cholangiogram. A Time Out was held and the above information confirmed.  Prior to the induction of general anesthesia, antibiotic prophylaxis was administered. General endotracheal anesthesia was then administered and tolerated well. After the induction, the abdomen was prepped with Chloraprep and draped in the sterile fashion. The patient was positioned in the supine position.  Local anesthetic agent was injected into the skin near the umbilicus and an incision made. We dissected down to the abdominal fascia with blunt dissection.  The fascia was incised vertically and we  entered the peritoneal cavity bluntly.  A pursestring suture of 0-Vicryl was placed around the fascial opening.  The Hasson cannula was inserted and secured with the stay suture.  Pneumoperitoneum was then created with CO2 and tolerated well without any adverse changes in the patient's vital signs. An 11-mm port was placed in the subxiphoid position.  Two 5-mm ports were placed in the right upper quadrant. All skin incisions were infiltrated with a local anesthetic agent before making the incision and placing the trocars.   We positioned the patient in reverse Trendelenburg, tilted slightly to the patient's left.  The gallbladder was identified, the fundus grasped and retracted cephalad. Adhesions were lysed bluntly and with the electrocautery where indicated, taking care not to injure any adjacent organs or viscus. The infundibulum was grasped and retracted laterally, exposing the peritoneum overlying the triangle of Calot. This was then divided and exposed in a blunt fashion. A critical view of the cystic duct and cystic artery was obtained.  The cystic duct was clearly identified and bluntly dissected circumferentially. The cystic duct was ligated with a clip distally.   An incision was made in the cystic duct and the [facility] cholangiogram catheter introduced. The catheter was secured using a clip. A cholangiogram was then obtained which showed good visualization of the distal and proximal biliary tree with no sign of filling defects or obstruction.  Contrast flowed into the duodenum. The catheter was then removed.   The cystic duct was then ligated with clips and divided. The cystic artery was identified, dissected free, ligated with clips and divided as well.   The gallbladder was dissected from the liver bed in retrograde fashion with the electrocautery. The gallbladder was removed and placed in an Endocatch sac. The liver bed was irrigated and inspected. Hemostasis was achieved with the electrocautery.  Copious irrigation was utilized and was repeatedly aspirated until  clear.  Surgicel SNOW was packed into the gallbladder fossa.  The gallbladder and Endocatch sac were then removed through the umbilical port site.  The pursestring suture was used to close the umbilical fascia.    We again inspected the right upper quadrant for hemostasis.  Pneumoperitoneum was released as we removed the trocars.  4-0 Monocryl was used to close the skin.   Benzoin, steri-strips, and clean dressings were applied. The patient was then extubated and brought to the recovery room in stable condition. Instrument, sponge, and needle counts were correct at closure and at the conclusion of the case.   Findings: Cholecystitis with Cholelithiasis  Estimated Blood Loss: less than 100 mL         Drains: none         Specimens: Gallbladder           Complications: None; patient tolerated the procedure well.         Disposition: PACU - hemodynamically stable.         Condition: stable  [name]. Cathy Dover, MD, [facility]  General/ Trauma Surgery  05/26/2013 8:28 AM

## 2013-05-26 NOTE — Interval H&P Note (Signed)
History and Physical Interval Note:  05/26/2013 6:46 AM  [name]  has presented today for surgery, with the diagnosis of chronic calculus cholecystitis   The various methods of treatment have been discussed with the patient and family. After consideration of risks, benefits and other options for treatment, the patient has consented to  Procedure(s): LAPAROSCOPIC CHOLECYSTECTOMY WITH INTRAOPERATIVE CHOLANGIOGRAM (N/A) as a surgical intervention .  The patient's history has been reviewed, patient examined, no change in status, stable for surgery.  I have reviewed the patient's chart and labs.  Questions were answered to the patient's satisfaction.     Mekiah Cambridge K.

## 2013-05-27 ENCOUNTER — Telehealth (INDEPENDENT_AMBULATORY_CARE_PROVIDER_SITE_OTHER): Payer: Self-pay

## 2013-05-27 ENCOUNTER — Encounter (HOSPITAL_COMMUNITY): Payer: Self-pay | Admitting: Surgery

## 2013-05-27 ENCOUNTER — Other Ambulatory Visit (INDEPENDENT_AMBULATORY_CARE_PROVIDER_SITE_OTHER): Payer: Self-pay | Admitting: Surgery

## 2013-05-27 MED ORDER — HYDROCODONE-ACETAMINOPHEN 5-325 MG PO TABS: 1.0000 | ORAL_TABLET | ORAL | Status: DC | PRN

## 2013-05-27 NOTE — Telephone Encounter (Signed)
She can break the tablets in half or she can just try taking ibuprofen.  She can use a heating pad on her abdomen.

## 2013-05-27 NOTE — Telephone Encounter (Signed)
Patient s/p lap chole w/IOC on 05/26/13. Taking percocet for pain but patient having nausea and dizziness.  Patient feel's the prescription is too strong, requesting a different prescription for pain.  Please advise.

## 2013-05-31 ENCOUNTER — Ambulatory Visit (INDEPENDENT_AMBULATORY_CARE_PROVIDER_SITE_OTHER): Payer: PRIVATE HEALTH INSURANCE | Admitting: General Surgery

## 2013-06-06 ENCOUNTER — Ambulatory Visit (INDEPENDENT_AMBULATORY_CARE_PROVIDER_SITE_OTHER): Payer: Medicare Other | Admitting: *Deleted

## 2013-06-06 DIAGNOSIS — E7800 Pure hypercholesterolemia, unspecified: Secondary | ICD-10-CM

## 2013-06-06 DIAGNOSIS — I4891 Unspecified atrial fibrillation: Secondary | ICD-10-CM | POA: Diagnosis not present

## 2013-06-06 DIAGNOSIS — Z5181 Encounter for therapeutic drug level monitoring: Secondary | ICD-10-CM | POA: Diagnosis not present

## 2013-06-06 LAB — HEPATIC FUNCTION PANEL
ALK PHOS: 77 U/L (ref 39–117)
ALT: 21 U/L (ref 0–35)
AST: 22 U/L (ref 0–37)
Albumin: 3.6 g/dL (ref 3.5–5.2)
BILIRUBIN DIRECT: 0 mg/dL (ref 0.0–0.3)
Total Bilirubin: 0.5 mg/dL (ref 0.3–1.2)
Total Protein: 6.7 g/dL (ref 6.0–8.3)

## 2013-06-06 LAB — POCT INR: INR: 2.2

## 2013-06-06 NOTE — Progress Notes (Signed)
Quick Note:  Preliminary report reviewed by triage nurse and sent to MD desk. ______ 

## 2013-06-14 ENCOUNTER — Telehealth: Payer: Self-pay | Admitting: Interventional Cardiology

## 2013-06-14 NOTE — Telephone Encounter (Signed)
New message         Pt returning nurses call for results of lab work

## 2013-06-15 NOTE — Telephone Encounter (Signed)
Follow up   ° ° °Calling for test results.   °

## 2013-06-15 NOTE — Telephone Encounter (Signed)
Message copied by Lamar Laundry on Wed Jun 15, 2013  4:36 PM ------      Message from: Daneen Schick      Created: Sat Jun 11, 2013 10:56 AM       normal ------

## 2013-06-15 NOTE — Telephone Encounter (Signed)
lmom labs were normal

## 2013-06-16 NOTE — Telephone Encounter (Signed)
Message copied by Lamar Laundry on Thu Jun 16, 2013  1:27 PM ------      Message from: Daneen Schick      Created: Sat Jun 11, 2013 10:56 AM       normal ------

## 2013-06-17 ENCOUNTER — Encounter (INDEPENDENT_AMBULATORY_CARE_PROVIDER_SITE_OTHER): Payer: Self-pay | Admitting: Surgery

## 2013-06-17 ENCOUNTER — Ambulatory Visit (INDEPENDENT_AMBULATORY_CARE_PROVIDER_SITE_OTHER): Payer: Medicare Other | Admitting: Surgery

## 2013-06-17 ENCOUNTER — Telehealth: Payer: Self-pay | Admitting: *Deleted

## 2013-06-17 VITALS — BP 119/70 | HR 72 | Temp 98.6°F | Resp 18 | Ht 65.0 in | Wt 133.4 lb

## 2013-06-17 DIAGNOSIS — K8010 Calculus of gallbladder with chronic cholecystitis without obstruction: Secondary | ICD-10-CM

## 2013-06-17 MED ORDER — WARFARIN SODIUM 2.5 MG PO TABS: 2.5000 mg | ORAL_TABLET | ORAL | Status: DC

## 2013-06-17 NOTE — Progress Notes (Signed)
Status post laparoscopic cholecystectomy on 05/26/13. Pathology showed chronic calculus cholecystitis. She is doing quite well.  Appetite is improving.  She still has some reflux symptoms and is back on Protonix.  No diarrhea.  Filed Vitals:   06/17/13 1043  BP: 119/70  Pulse: 72  Temp: 98.6 F (37 C)  Resp: 18   Incisions well-healed with no sign of infection.  No abdominal tenderness.   Resume full activity. Follow-up PRN.  Imogene Burn. Georgette Dover, MD, [facility]  General/ Trauma Surgery  06/17/2013 11:06 AM

## 2013-06-17 NOTE — Telephone Encounter (Signed)
Patient requests coumadin refill be sent to [facility]. She stated that she will take her last one today. Thanks, MI

## 2013-06-27 ENCOUNTER — Encounter (INDEPENDENT_AMBULATORY_CARE_PROVIDER_SITE_OTHER): Payer: Self-pay

## 2013-07-15 ENCOUNTER — Ambulatory Visit (INDEPENDENT_AMBULATORY_CARE_PROVIDER_SITE_OTHER): Payer: Medicare Other

## 2013-07-15 DIAGNOSIS — I4891 Unspecified atrial fibrillation: Secondary | ICD-10-CM | POA: Diagnosis not present

## 2013-07-15 DIAGNOSIS — Z5181 Encounter for therapeutic drug level monitoring: Secondary | ICD-10-CM | POA: Diagnosis not present

## 2013-07-15 LAB — POCT INR: INR: 2.2

## 2013-07-27 DIAGNOSIS — K219 Gastro-esophageal reflux disease without esophagitis: Secondary | ICD-10-CM | POA: Diagnosis not present

## 2013-07-27 DIAGNOSIS — J309 Allergic rhinitis, unspecified: Secondary | ICD-10-CM | POA: Diagnosis not present

## 2013-07-27 DIAGNOSIS — F411 Generalized anxiety disorder: Secondary | ICD-10-CM | POA: Diagnosis not present

## 2013-08-15 ENCOUNTER — Telehealth: Payer: Self-pay | Admitting: Interventional Cardiology

## 2013-08-15 NOTE — Telephone Encounter (Signed)
Patient missed last night's dose (5 mg), she was advised to take warfarin 2.5 mg now, then resume her usual dosage tonight.  Patient agreeable.

## 2013-08-15 NOTE — Telephone Encounter (Signed)
New Message  Pt called states that she has missed her warfarin dosage. Requests a call back to discuss when and how to take medication moving forward. Please call

## 2013-08-26 ENCOUNTER — Ambulatory Visit (INDEPENDENT_AMBULATORY_CARE_PROVIDER_SITE_OTHER): Payer: Medicare Other | Admitting: *Deleted

## 2013-08-26 DIAGNOSIS — I4891 Unspecified atrial fibrillation: Secondary | ICD-10-CM | POA: Diagnosis not present

## 2013-08-26 DIAGNOSIS — Z5181 Encounter for therapeutic drug level monitoring: Secondary | ICD-10-CM

## 2013-08-26 LAB — POCT INR: INR: 2.4

## 2013-09-08 ENCOUNTER — Telehealth: Payer: Self-pay | Admitting: Interventional Cardiology

## 2013-09-08 NOTE — Telephone Encounter (Signed)
She would like to ask you some questions about fall she had. Please call and advise.

## 2013-09-08 NOTE — Telephone Encounter (Signed)
Patient tells me that she didn't fall, but she hit her head on the aluminum towel bar when she went to sit down on the toilet with the lights off.  This occurred 48 hours ago, and she denies any confusion, vision changes, pains, balance issues, etc.  She wanted to let us know however.  Patient did have multiple falls earlier this year, however after changing from glasses to contacts, this problem has resolved on its own.  Her INR recently was 2.4.    Patient advised to continue her current medications, but to go to the ER if she notices any vision changes, headaches, gait disturbances, or confusion.  She agrees to not walk through the house with the lights off, and will let us know if the falls become more frequent.  To Dr. Tamala Julian as Juluis Rainier.

## 2013-09-08 NOTE — Telephone Encounter (Signed)
Agree 

## 2013-09-12 DIAGNOSIS — R55 Syncope and collapse: Secondary | ICD-10-CM | POA: Diagnosis not present

## 2013-09-16 ENCOUNTER — Telehealth: Payer: Self-pay

## 2013-09-16 NOTE — Telephone Encounter (Signed)
Pt called clinic to report missing 1 dosage of Coumadin last night.  Reviewed pt's chart last INR was 2.4 08/26/13.  Pt's normal Coumadin dosage is 2.5mg  daily except 5mg  on Sundays, Tuesdays, and Thursdays.  Advised pt to take 5mg  today and then resume normal dosage.  Pt verbalized understanding.

## 2013-09-22 DIAGNOSIS — E785 Hyperlipidemia, unspecified: Secondary | ICD-10-CM | POA: Diagnosis not present

## 2013-09-22 DIAGNOSIS — R42 Dizziness and giddiness: Secondary | ICD-10-CM | POA: Diagnosis not present

## 2013-09-28 ENCOUNTER — Ambulatory Visit: Payer: Medicare HMO | Admitting: Interventional Cardiology

## 2013-10-05 ENCOUNTER — Other Ambulatory Visit: Payer: Self-pay | Admitting: Interventional Cardiology

## 2013-10-06 ENCOUNTER — Ambulatory Visit (INDEPENDENT_AMBULATORY_CARE_PROVIDER_SITE_OTHER): Payer: Medicare Other | Admitting: *Deleted

## 2013-10-06 DIAGNOSIS — I4891 Unspecified atrial fibrillation: Secondary | ICD-10-CM

## 2013-10-06 DIAGNOSIS — Z5181 Encounter for therapeutic drug level monitoring: Secondary | ICD-10-CM | POA: Diagnosis not present

## 2013-10-06 LAB — POCT INR: INR: 2.9

## 2013-10-06 NOTE — Telephone Encounter (Signed)
Follow up   Pt requests a call back to discuss her Ptinr levels.. pt requests that Ysidro Evert calls her back personnally. Please call

## 2013-10-06 NOTE — Telephone Encounter (Signed)
LMOM for pt that [name] is out of town.

## 2013-10-10 NOTE — Telephone Encounter (Signed)
Explained to patient that levothyroxine can elevate INR and this is the reason that vitamin K needed to be increased in diet.

## 2013-10-21 ENCOUNTER — Ambulatory Visit (INDEPENDENT_AMBULATORY_CARE_PROVIDER_SITE_OTHER): Payer: Medicare Other | Admitting: Pharmacist

## 2013-10-21 ENCOUNTER — Encounter: Payer: Self-pay | Admitting: Interventional Cardiology

## 2013-10-21 ENCOUNTER — Ambulatory Visit (INDEPENDENT_AMBULATORY_CARE_PROVIDER_SITE_OTHER): Payer: Medicare Other | Admitting: Interventional Cardiology

## 2013-10-21 VITALS — BP 105/59 | HR 56 | Ht 65.0 in | Wt 138.0 lb

## 2013-10-21 DIAGNOSIS — I10 Essential (primary) hypertension: Secondary | ICD-10-CM | POA: Diagnosis not present

## 2013-10-21 DIAGNOSIS — I4891 Unspecified atrial fibrillation: Secondary | ICD-10-CM

## 2013-10-21 DIAGNOSIS — Z5181 Encounter for therapeutic drug level monitoring: Secondary | ICD-10-CM

## 2013-10-21 DIAGNOSIS — E7800 Pure hypercholesterolemia, unspecified: Secondary | ICD-10-CM

## 2013-10-21 DIAGNOSIS — I480 Paroxysmal atrial fibrillation: Secondary | ICD-10-CM

## 2013-10-21 LAB — POCT INR: INR: 2.9

## 2013-10-21 NOTE — Progress Notes (Signed)
Patient ID: [name], female   DOB: 09-16-1945, 68 y.o.   MRN: [id number]    [address], [place], [place]  [address] Phone: [phone] Fax:  [phone]  Date:  10/21/2013   ID:  [name], DOB 1946/01/05, MRN [id number]  PCP:  Gavin Pound, MD   ASSESSMENT:  1.Atrial fibrillation, paroxysmal. She is asymptomatic. She has Atrial fibrillation without knowledge  2. Chronic anticoagulation 3. Hypertension, controlled  PLAN:  1. Continue anticoagulation therapy because of asymptomatic nature of her episodes of atrial fibrillation 2. Low salt diet 3. Continue to monitor Coumadin in Coumadin clinic . Clinical followup in one year   SUBJECTIVE: [name] is a 68 y.o. female who is doing well. No cardiovascular complaints. Cholecystectomy is that her tobacco improvement and chronic recurring epigastric and chest discomfort she was having. She denies orthopnea, PND, and syncope. No edema. No medication side effects.   Wt Readings from Last 3 Encounters:  10/21/13 138 lb (62.596 kg)  06/17/13 133 lb 6.4 oz (60.51 kg)  05/24/13 137 lb (62.143 kg)     Past Medical History  Diagnosis Date  . Hypertension   . Hypercholesteremia   . Basal cell carcinoma     s/p MOHS surgery in 03/2011  . CAD (coronary artery disease)   . Headache(784.0)     MIGRAINES  . Atrial fibrillation   . Anxiety     Hx of, responds to Wellbutrin  . History of shingles   . Nocturnal muscle cramp     HISTORY IN LEFT LEG  . Hx: UTI (urinary tract infection)   . Deafness in right ear   . PAF (paroxysmal atrial fibrillation)     2 brief episodes of Afib recorded on monitor 02/2007  . PONV (postoperative nausea and vomiting)   . Dysrhythmia     Dr. Daneen Schick  . Cataract   . History of neck pain     Responds to Flexeril  . Acoustic neuroma     Hx of right ear acoustic neuroma, removed in 1999. No hearing in right ear.  [name] TIA (transient ischemic attack) 2011    On coumadin. Asymptomatic, without recurrence.  [name] HLD (hyperlipidemia)     [place] to multiple statins  . Hearing loss     Only in right ear, from an acoustic neuroma. S/P removal in 1999.   [name] History of echocardiogram 2011    Structually normal heart by ECHO with mild to mod MR   . History of nuclear stress test 04/2005    Cardiolite nuc test without ischemia  . Sinus bradycardia     Current Outpatient Prescriptions  Medication Sig Dispense Refill  . buPROPion (WELLBUTRIN XL) 300 MG 24 hr tablet Take 300 mg by mouth daily.      . clonazePAM (KLONOPIN) 0.5 MG tablet Take 0.25-0.5 mg by mouth daily as needed for anxiety.       [name] FLUoxetine (PROZAC) 40 MG capsule Take 40 mg by mouth daily.       . fluticasone (FLONASE) 50 MCG/ACT nasal spray Place 2 sprays into the nose daily as needed for allergies.       . Glycerin-Polysorbate 80 (REFRESH DRY EYE THERAPY) 1-1 % SOLN Apply 1 drop to eye at bedtime.       [name] levothyroxine (SYNTHROID, LEVOTHROID) 25 MCG tablet Take 25 mcg by mouth daily before breakfast.      . loratadine (CLARITIN) 10 MG tablet Take 10 mg  by mouth daily as needed for allergies.      . pantoprazole (PROTONIX) 40 MG tablet Take 40 mg by mouth daily.       Vladimir Faster Glycol-Propyl Glycol (SYSTANE) 0.4-0.3 % GEL Place 1 drop into both eyes at bedtime.       . rosuvastatin (CRESTOR) 20 MG tablet Take 20 mg by mouth at bedtime. Cholesterol     Takes 1/2 tablet daily      . warfarin (COUMADIN) 2.5 MG tablet TAKE ONE TABLET BY MOUTH AS DIRECTED  40 tablet  0   No current facility-administered medications for this visit.    Allergies:    Allergies  Allergen Reactions  . Atorvastatin Other (See Comments)    Intolerant of multiple statins Muscle weakness   . Codeine   . Penicillins Rash    Social History:  The patient  reports that she has never smoked. She has never used smokeless tobacco. She reports that she does not use illicit drugs.   ROS:  Please see the history  of present illness.   She denies blood in urine or stool. No neurological complaints.   All other systems reviewed and negative.   OBJECTIVE: VS:  BP 105/59  Pulse 56  Ht 5\' 5"  (1.651 m)  Wt 138 lb (62.596 kg)  BMI 22.96 kg/m2 Well nourished, well developed, in no acute distress, appears than his stated age 42: normal Neck: JVD flat. Carotid bruit absent  Cardiac:  normal S1, S2; RRR; no murmur Lungs:  clear to auscultation bilaterally, no wheezing, rhonchi or rales Abd: soft, nontender, no hepatomegaly Ext: Edema absent. Pulses 2+ Skin: warm and dry Neuro:  CNs 2-12 intact, no focal abnormalities noted  EKG:  Not repeated       Signed, Illene Labrador III, MD 10/21/2013 10:06 AM

## 2013-10-21 NOTE — Patient Instructions (Addendum)
Your physician recommends that you continue on your current medications as directed. Please refer to the Current Medication list given to you today.\  CONTINUE COUMADIN FOLLOW UP APPOINTMENTS   Your physician wants you to follow-up in: [place] will receive a reminder letter in the mail two months in advance. If you don't receive a letter, please call our office to schedule the follow-up appointment.

## 2013-10-25 DIAGNOSIS — N899 Noninflammatory disorder of vagina, unspecified: Secondary | ICD-10-CM | POA: Diagnosis not present

## 2013-11-04 ENCOUNTER — Other Ambulatory Visit: Payer: Self-pay | Admitting: *Deleted

## 2013-11-04 MED ORDER — WARFARIN SODIUM 2.5 MG PO TABS: 2.5000 mg | ORAL_TABLET | ORAL | Status: DC

## 2013-11-08 DIAGNOSIS — D485 Neoplasm of uncertain behavior of skin: Secondary | ICD-10-CM | POA: Diagnosis not present

## 2013-11-08 DIAGNOSIS — Z85828 Personal history of other malignant neoplasm of skin: Secondary | ICD-10-CM | POA: Diagnosis not present

## 2013-11-08 DIAGNOSIS — L570 Actinic keratosis: Secondary | ICD-10-CM | POA: Diagnosis not present

## 2013-11-11 ENCOUNTER — Ambulatory Visit (INDEPENDENT_AMBULATORY_CARE_PROVIDER_SITE_OTHER): Payer: Medicare Other | Admitting: Pharmacist

## 2013-11-11 DIAGNOSIS — I480 Paroxysmal atrial fibrillation: Secondary | ICD-10-CM

## 2013-11-11 DIAGNOSIS — I4891 Unspecified atrial fibrillation: Secondary | ICD-10-CM | POA: Diagnosis not present

## 2013-11-11 DIAGNOSIS — Z5181 Encounter for therapeutic drug level monitoring: Secondary | ICD-10-CM

## 2013-11-11 LAB — POCT INR: INR: 3.3

## 2013-11-23 DIAGNOSIS — E038 Other specified hypothyroidism: Secondary | ICD-10-CM | POA: Diagnosis not present

## 2013-11-23 DIAGNOSIS — F411 Generalized anxiety disorder: Secondary | ICD-10-CM | POA: Diagnosis not present

## 2013-12-02 ENCOUNTER — Ambulatory Visit (INDEPENDENT_AMBULATORY_CARE_PROVIDER_SITE_OTHER): Payer: Medicare Other | Admitting: Pharmacist

## 2013-12-02 DIAGNOSIS — Z5181 Encounter for therapeutic drug level monitoring: Secondary | ICD-10-CM

## 2013-12-02 DIAGNOSIS — I4891 Unspecified atrial fibrillation: Secondary | ICD-10-CM

## 2013-12-02 DIAGNOSIS — I480 Paroxysmal atrial fibrillation: Secondary | ICD-10-CM

## 2013-12-02 LAB — POCT INR: INR: 3.3

## 2013-12-07 ENCOUNTER — Telehealth: Payer: Self-pay | Admitting: Interventional Cardiology

## 2013-12-07 NOTE — Telephone Encounter (Signed)
New Message  Pt called to discuss her Crestor being cut in half.. She requests a call back to determine if her Cholesterol will need to be checked. Please call back to discuss

## 2013-12-16 ENCOUNTER — Telehealth: Payer: Self-pay | Admitting: Pharmacist

## 2013-12-16 DIAGNOSIS — E785 Hyperlipidemia, unspecified: Secondary | ICD-10-CM

## 2013-12-16 NOTE — Telephone Encounter (Signed)
Patient saw Dr. Orland Penman 3 months ago and mentioned she thought she was having memory problems.  Dr. Orland Penman told her to cut Crestor 20 mg down to 10 mg qd.  She also started her on levothyroxine.  Patient asked Dr. Orland Penman to recheck her cholesterol since she reduced Crestor, but she was told it would have to be done here.  LDL was last checked 05/2013 and was < 70 mg/dL on crestor 20 mg qd.  Will recheck lipid / liver this week when she comes in for protime now that she's on Crestor 10 mg qd and feeling better.

## 2013-12-16 NOTE — Telephone Encounter (Signed)
Already addressed. See other telephone note.

## 2013-12-16 NOTE — Telephone Encounter (Signed)
New   2nd message  Pt called to discuss her Crestor being cut in half.. She requests a call back to determine if her Cholesterol will need to be checked. Please call back to discuss

## 2013-12-20 ENCOUNTER — Ambulatory Visit (INDEPENDENT_AMBULATORY_CARE_PROVIDER_SITE_OTHER): Payer: Medicare Other | Admitting: *Deleted

## 2013-12-20 DIAGNOSIS — Z5181 Encounter for therapeutic drug level monitoring: Secondary | ICD-10-CM | POA: Diagnosis not present

## 2013-12-20 DIAGNOSIS — I480 Paroxysmal atrial fibrillation: Secondary | ICD-10-CM

## 2013-12-20 DIAGNOSIS — I4891 Unspecified atrial fibrillation: Secondary | ICD-10-CM | POA: Diagnosis not present

## 2013-12-20 LAB — POCT INR: INR: 2.4

## 2013-12-22 ENCOUNTER — Other Ambulatory Visit: Payer: Medicare Other

## 2013-12-26 ENCOUNTER — Other Ambulatory Visit (INDEPENDENT_AMBULATORY_CARE_PROVIDER_SITE_OTHER): Payer: Medicare Other

## 2013-12-26 DIAGNOSIS — E785 Hyperlipidemia, unspecified: Secondary | ICD-10-CM

## 2013-12-26 LAB — HEPATIC FUNCTION PANEL
ALK PHOS: 73 U/L (ref 39–117)
ALT: 17 U/L (ref 0–35)
AST: 22 U/L (ref 0–37)
Albumin: 3.9 g/dL (ref 3.5–5.2)
BILIRUBIN DIRECT: 0 mg/dL (ref 0.0–0.3)
TOTAL PROTEIN: 6.7 g/dL (ref 6.0–8.3)
Total Bilirubin: 0.3 mg/dL (ref 0.2–1.2)

## 2013-12-26 LAB — LIPID PANEL
CHOL/HDL RATIO: 4
CHOLESTEROL: 181 mg/dL (ref 0–200)
HDL: 46 mg/dL (ref >39.00)
NonHDL: 135
TRIGLYCERIDES: 224 mg/dL — AB (ref 0.0–149.0)
VLDL: 44.8 mg/dL — ABNORMAL HIGH (ref 0.0–40.0)

## 2013-12-26 LAB — LDL CHOLESTEROL, DIRECT: Direct LDL: 106.6 mg/dL

## 2013-12-29 ENCOUNTER — Telehealth: Payer: Self-pay | Admitting: Interventional Cardiology

## 2013-12-29 DIAGNOSIS — E785 Hyperlipidemia, unspecified: Secondary | ICD-10-CM

## 2013-12-29 NOTE — Telephone Encounter (Signed)
New message  ° ° °Patient calling for test results.   °

## 2013-12-30 DIAGNOSIS — R7989 Other specified abnormal findings of blood chemistry: Secondary | ICD-10-CM | POA: Diagnosis not present

## 2013-12-30 DIAGNOSIS — E039 Hypothyroidism, unspecified: Secondary | ICD-10-CM | POA: Diagnosis not present

## 2013-12-30 NOTE — Telephone Encounter (Signed)
pt given lab results.  Okay. Repeat 1 year.pt verbalized understanding.

## 2013-12-30 NOTE — Telephone Encounter (Signed)
Message copied by Lamar Laundry on Fri Dec 30, 2013  8:51 AM ------      Message from: Daneen Schick      Created: Thu Dec 29, 2013  6:39 PM       Okay. Repeat 1 year. ------

## 2014-01-04 DIAGNOSIS — E039 Hypothyroidism, unspecified: Secondary | ICD-10-CM | POA: Diagnosis not present

## 2014-01-04 DIAGNOSIS — Z23 Encounter for immunization: Secondary | ICD-10-CM | POA: Diagnosis not present

## 2014-01-04 DIAGNOSIS — R42 Dizziness and giddiness: Secondary | ICD-10-CM | POA: Diagnosis not present

## 2014-01-10 ENCOUNTER — Ambulatory Visit (INDEPENDENT_AMBULATORY_CARE_PROVIDER_SITE_OTHER): Payer: Medicare Other | Admitting: *Deleted

## 2014-01-10 DIAGNOSIS — Z5181 Encounter for therapeutic drug level monitoring: Secondary | ICD-10-CM

## 2014-01-10 DIAGNOSIS — I480 Paroxysmal atrial fibrillation: Secondary | ICD-10-CM | POA: Diagnosis not present

## 2014-01-10 LAB — POCT INR: INR: 2

## 2014-01-17 ENCOUNTER — Ambulatory Visit (HOSPITAL_COMMUNITY)
Admission: RE | Admit: 2014-01-17 | Discharge: 2014-01-17 | Disposition: A | Discharge status: Home / Self Care | Payer: Medicare Other | Source: Ambulatory Visit | Attending: Otolaryngology | Admitting: Otolaryngology

## 2014-01-17 ENCOUNTER — Other Ambulatory Visit (HOSPITAL_COMMUNITY): Payer: Self-pay | Admitting: Otolaryngology

## 2014-01-17 DIAGNOSIS — J479 Bronchiectasis, uncomplicated: Secondary | ICD-10-CM

## 2014-01-17 DIAGNOSIS — J449 Chronic obstructive pulmonary disease, unspecified: Secondary | ICD-10-CM | POA: Insufficient documentation

## 2014-01-17 DIAGNOSIS — J322 Chronic ethmoidal sinusitis: Secondary | ICD-10-CM | POA: Diagnosis not present

## 2014-01-17 DIAGNOSIS — J320 Chronic maxillary sinusitis: Secondary | ICD-10-CM | POA: Diagnosis not present

## 2014-01-17 DIAGNOSIS — J301 Allergic rhinitis due to pollen: Secondary | ICD-10-CM | POA: Diagnosis not present

## 2014-01-17 DIAGNOSIS — J3081 Allergic rhinitis due to animal (cat) (dog) hair and dander: Secondary | ICD-10-CM | POA: Diagnosis not present

## 2014-01-17 DIAGNOSIS — J410 Simple chronic bronchitis: Secondary | ICD-10-CM | POA: Diagnosis not present

## 2014-01-24 ENCOUNTER — Ambulatory Visit (INDEPENDENT_AMBULATORY_CARE_PROVIDER_SITE_OTHER): Payer: Medicare Other | Admitting: *Deleted

## 2014-01-24 DIAGNOSIS — I480 Paroxysmal atrial fibrillation: Secondary | ICD-10-CM

## 2014-01-24 DIAGNOSIS — Z5181 Encounter for therapeutic drug level monitoring: Secondary | ICD-10-CM | POA: Diagnosis not present

## 2014-01-24 DIAGNOSIS — I4891 Unspecified atrial fibrillation: Secondary | ICD-10-CM

## 2014-01-24 LAB — POCT INR: INR: 2.2

## 2014-01-26 DIAGNOSIS — J322 Chronic ethmoidal sinusitis: Secondary | ICD-10-CM | POA: Diagnosis not present

## 2014-01-26 DIAGNOSIS — H8141 Vertigo of central origin, right ear: Secondary | ICD-10-CM | POA: Diagnosis not present

## 2014-01-26 DIAGNOSIS — D333 Benign neoplasm of cranial nerves: Secondary | ICD-10-CM | POA: Diagnosis not present

## 2014-01-26 DIAGNOSIS — J320 Chronic maxillary sinusitis: Secondary | ICD-10-CM | POA: Diagnosis not present

## 2014-01-26 DIAGNOSIS — J3081 Allergic rhinitis due to animal (cat) (dog) hair and dander: Secondary | ICD-10-CM | POA: Diagnosis not present

## 2014-01-26 DIAGNOSIS — H9311 Tinnitus, right ear: Secondary | ICD-10-CM | POA: Diagnosis not present

## 2014-01-26 DIAGNOSIS — J301 Allergic rhinitis due to pollen: Secondary | ICD-10-CM | POA: Diagnosis not present

## 2014-02-07 ENCOUNTER — Ambulatory Visit (INDEPENDENT_AMBULATORY_CARE_PROVIDER_SITE_OTHER): Payer: Medicare Other

## 2014-02-07 DIAGNOSIS — Z5181 Encounter for therapeutic drug level monitoring: Secondary | ICD-10-CM

## 2014-02-07 DIAGNOSIS — I4891 Unspecified atrial fibrillation: Secondary | ICD-10-CM

## 2014-02-07 DIAGNOSIS — I480 Paroxysmal atrial fibrillation: Secondary | ICD-10-CM

## 2014-02-07 LAB — POCT INR: INR: 2.3

## 2014-02-14 DIAGNOSIS — J329 Chronic sinusitis, unspecified: Secondary | ICD-10-CM | POA: Diagnosis not present

## 2014-02-14 DIAGNOSIS — R5383 Other fatigue: Secondary | ICD-10-CM | POA: Diagnosis not present

## 2014-02-14 DIAGNOSIS — E039 Hypothyroidism, unspecified: Secondary | ICD-10-CM | POA: Diagnosis not present

## 2014-02-23 ENCOUNTER — Ambulatory Visit (INDEPENDENT_AMBULATORY_CARE_PROVIDER_SITE_OTHER): Payer: Medicare Other | Admitting: Pharmacist Clinician (PhC)/ Clinical Pharmacy Specialist

## 2014-02-23 DIAGNOSIS — Z5181 Encounter for therapeutic drug level monitoring: Secondary | ICD-10-CM | POA: Diagnosis not present

## 2014-02-23 DIAGNOSIS — I4891 Unspecified atrial fibrillation: Secondary | ICD-10-CM | POA: Diagnosis not present

## 2014-02-23 DIAGNOSIS — I480 Paroxysmal atrial fibrillation: Secondary | ICD-10-CM | POA: Diagnosis not present

## 2014-02-23 LAB — POCT INR: INR: 4.3

## 2014-03-06 DIAGNOSIS — J029 Acute pharyngitis, unspecified: Secondary | ICD-10-CM | POA: Diagnosis not present

## 2014-03-08 ENCOUNTER — Ambulatory Visit (INDEPENDENT_AMBULATORY_CARE_PROVIDER_SITE_OTHER): Payer: Medicare Other | Admitting: *Deleted

## 2014-03-08 DIAGNOSIS — I4891 Unspecified atrial fibrillation: Secondary | ICD-10-CM

## 2014-03-08 DIAGNOSIS — Z5181 Encounter for therapeutic drug level monitoring: Secondary | ICD-10-CM | POA: Diagnosis not present

## 2014-03-08 DIAGNOSIS — I480 Paroxysmal atrial fibrillation: Secondary | ICD-10-CM | POA: Diagnosis not present

## 2014-03-08 LAB — POCT INR: INR: 3.2

## 2014-03-13 ENCOUNTER — Ambulatory Visit (INDEPENDENT_AMBULATORY_CARE_PROVIDER_SITE_OTHER): Payer: Medicare Other | Admitting: *Deleted

## 2014-03-13 DIAGNOSIS — Z5181 Encounter for therapeutic drug level monitoring: Secondary | ICD-10-CM | POA: Diagnosis not present

## 2014-03-13 DIAGNOSIS — I4891 Unspecified atrial fibrillation: Secondary | ICD-10-CM

## 2014-03-13 DIAGNOSIS — I480 Paroxysmal atrial fibrillation: Secondary | ICD-10-CM | POA: Diagnosis not present

## 2014-03-13 LAB — POCT INR: INR: 1.8

## 2014-03-21 DIAGNOSIS — L820 Inflamed seborrheic keratosis: Secondary | ICD-10-CM | POA: Diagnosis not present

## 2014-03-23 ENCOUNTER — Other Ambulatory Visit: Payer: Self-pay | Admitting: Interventional Cardiology

## 2014-03-24 ENCOUNTER — Ambulatory Visit (INDEPENDENT_AMBULATORY_CARE_PROVIDER_SITE_OTHER): Payer: Medicare Other

## 2014-03-24 DIAGNOSIS — Z5181 Encounter for therapeutic drug level monitoring: Secondary | ICD-10-CM

## 2014-03-24 DIAGNOSIS — I4891 Unspecified atrial fibrillation: Secondary | ICD-10-CM

## 2014-03-24 DIAGNOSIS — I480 Paroxysmal atrial fibrillation: Secondary | ICD-10-CM | POA: Diagnosis not present

## 2014-03-24 LAB — POCT INR: INR: 2.8

## 2014-04-11 DIAGNOSIS — J329 Chronic sinusitis, unspecified: Secondary | ICD-10-CM | POA: Diagnosis not present

## 2014-04-12 ENCOUNTER — Ambulatory Visit (INDEPENDENT_AMBULATORY_CARE_PROVIDER_SITE_OTHER): Payer: Medicare Other | Admitting: *Deleted

## 2014-04-12 DIAGNOSIS — Z5181 Encounter for therapeutic drug level monitoring: Secondary | ICD-10-CM

## 2014-04-12 DIAGNOSIS — I4891 Unspecified atrial fibrillation: Secondary | ICD-10-CM | POA: Diagnosis not present

## 2014-04-12 DIAGNOSIS — I480 Paroxysmal atrial fibrillation: Secondary | ICD-10-CM | POA: Diagnosis not present

## 2014-04-12 LAB — POCT INR: INR: 2.6

## 2014-04-17 ENCOUNTER — Ambulatory Visit (INDEPENDENT_AMBULATORY_CARE_PROVIDER_SITE_OTHER): Payer: Medicare Other

## 2014-04-17 DIAGNOSIS — I4891 Unspecified atrial fibrillation: Secondary | ICD-10-CM

## 2014-04-17 DIAGNOSIS — I480 Paroxysmal atrial fibrillation: Secondary | ICD-10-CM | POA: Diagnosis not present

## 2014-04-17 DIAGNOSIS — Z5181 Encounter for therapeutic drug level monitoring: Secondary | ICD-10-CM

## 2014-04-17 LAB — POCT INR: INR: 2.1

## 2014-04-24 ENCOUNTER — Ambulatory Visit (INDEPENDENT_AMBULATORY_CARE_PROVIDER_SITE_OTHER): Payer: Medicare Other

## 2014-04-24 DIAGNOSIS — I480 Paroxysmal atrial fibrillation: Secondary | ICD-10-CM

## 2014-04-24 DIAGNOSIS — Z5181 Encounter for therapeutic drug level monitoring: Secondary | ICD-10-CM

## 2014-04-24 DIAGNOSIS — I4891 Unspecified atrial fibrillation: Secondary | ICD-10-CM | POA: Diagnosis not present

## 2014-04-24 LAB — POCT INR: INR: 2.3

## 2014-05-15 ENCOUNTER — Ambulatory Visit (INDEPENDENT_AMBULATORY_CARE_PROVIDER_SITE_OTHER): Payer: Medicare Other | Admitting: *Deleted

## 2014-05-15 DIAGNOSIS — I480 Paroxysmal atrial fibrillation: Secondary | ICD-10-CM

## 2014-05-15 DIAGNOSIS — Z5181 Encounter for therapeutic drug level monitoring: Secondary | ICD-10-CM

## 2014-05-15 DIAGNOSIS — I4891 Unspecified atrial fibrillation: Secondary | ICD-10-CM | POA: Diagnosis not present

## 2014-05-15 LAB — POCT INR: INR: 1.9

## 2014-05-19 ENCOUNTER — Other Ambulatory Visit (HOSPITAL_COMMUNITY): Payer: Self-pay | Admitting: Family Medicine

## 2014-05-19 DIAGNOSIS — Z1231 Encounter for screening mammogram for malignant neoplasm of breast: Secondary | ICD-10-CM

## 2014-05-24 ENCOUNTER — Ambulatory Visit (HOSPITAL_COMMUNITY)
Admission: RE | Admit: 2014-05-24 | Discharge: 2014-05-24 | Disposition: A | Discharge status: Home / Self Care | Payer: Medicare Other | Source: Ambulatory Visit | Attending: Family Medicine | Admitting: Family Medicine

## 2014-05-24 DIAGNOSIS — Z1231 Encounter for screening mammogram for malignant neoplasm of breast: Secondary | ICD-10-CM | POA: Diagnosis not present

## 2014-05-30 ENCOUNTER — Ambulatory Visit (INDEPENDENT_AMBULATORY_CARE_PROVIDER_SITE_OTHER): Payer: Medicare Other | Admitting: Pharmacist

## 2014-05-30 DIAGNOSIS — Z5181 Encounter for therapeutic drug level monitoring: Secondary | ICD-10-CM | POA: Diagnosis not present

## 2014-05-30 DIAGNOSIS — I4891 Unspecified atrial fibrillation: Secondary | ICD-10-CM

## 2014-05-30 DIAGNOSIS — I480 Paroxysmal atrial fibrillation: Secondary | ICD-10-CM | POA: Diagnosis not present

## 2014-05-30 LAB — POCT INR: INR: 1.6

## 2014-06-01 ENCOUNTER — Telehealth: Payer: Self-pay | Admitting: Pharmacist Clinician (PhC)/ Clinical Pharmacy Specialist

## 2014-06-01 DIAGNOSIS — J329 Chronic sinusitis, unspecified: Secondary | ICD-10-CM | POA: Diagnosis not present

## 2014-06-01 NOTE — Telephone Encounter (Signed)
Pt called because she got antibiotic today, was advised by pharmacy to call.  Taking Avelox 400mg  daily.  Last INR was just Tuesday, low at 1.6.  Dose was increased to 1 tablet daily except 2 tablets Sundays and Wednesdays.    Advised patient to take just 1 tablet on Sunday, keep appointment already set for next Tuesday.  Pt voiced understanding.

## 2014-06-05 ENCOUNTER — Ambulatory Visit (INDEPENDENT_AMBULATORY_CARE_PROVIDER_SITE_OTHER): Payer: Medicare Other | Admitting: Pharmacist

## 2014-06-05 DIAGNOSIS — I4891 Unspecified atrial fibrillation: Secondary | ICD-10-CM

## 2014-06-05 DIAGNOSIS — Z5181 Encounter for therapeutic drug level monitoring: Secondary | ICD-10-CM | POA: Diagnosis not present

## 2014-06-05 DIAGNOSIS — I480 Paroxysmal atrial fibrillation: Secondary | ICD-10-CM

## 2014-06-05 LAB — POCT INR: INR: 1.8

## 2014-06-06 DIAGNOSIS — R631 Polydipsia: Secondary | ICD-10-CM | POA: Diagnosis not present

## 2014-06-06 DIAGNOSIS — I499 Cardiac arrhythmia, unspecified: Secondary | ICD-10-CM | POA: Diagnosis not present

## 2014-06-06 DIAGNOSIS — I480 Paroxysmal atrial fibrillation: Secondary | ICD-10-CM | POA: Diagnosis not present

## 2014-06-06 DIAGNOSIS — E039 Hypothyroidism, unspecified: Secondary | ICD-10-CM | POA: Diagnosis not present

## 2014-06-06 DIAGNOSIS — E538 Deficiency of other specified B group vitamins: Secondary | ICD-10-CM | POA: Diagnosis not present

## 2014-06-06 DIAGNOSIS — T148 Other injury of unspecified body region: Secondary | ICD-10-CM | POA: Diagnosis not present

## 2014-06-16 DIAGNOSIS — H2513 Age-related nuclear cataract, bilateral: Secondary | ICD-10-CM | POA: Diagnosis not present

## 2014-06-19 ENCOUNTER — Ambulatory Visit (INDEPENDENT_AMBULATORY_CARE_PROVIDER_SITE_OTHER): Payer: Medicare Other | Admitting: *Deleted

## 2014-06-19 DIAGNOSIS — I4891 Unspecified atrial fibrillation: Secondary | ICD-10-CM

## 2014-06-19 DIAGNOSIS — I480 Paroxysmal atrial fibrillation: Secondary | ICD-10-CM

## 2014-06-19 DIAGNOSIS — Z5181 Encounter for therapeutic drug level monitoring: Secondary | ICD-10-CM | POA: Diagnosis not present

## 2014-06-19 LAB — POCT INR: INR: 2.3

## 2014-06-21 DIAGNOSIS — R5383 Other fatigue: Secondary | ICD-10-CM | POA: Diagnosis not present

## 2014-07-05 ENCOUNTER — Ambulatory Visit (INDEPENDENT_AMBULATORY_CARE_PROVIDER_SITE_OTHER): Payer: Medicare Other | Admitting: *Deleted

## 2014-07-05 ENCOUNTER — Ambulatory Visit (INDEPENDENT_AMBULATORY_CARE_PROVIDER_SITE_OTHER): Payer: Medicare Other | Admitting: Interventional Cardiology

## 2014-07-05 ENCOUNTER — Encounter: Payer: Self-pay | Admitting: Interventional Cardiology

## 2014-07-05 VITALS — BP 124/60 | HR 52 | Ht 65.0 in | Wt 141.0 lb

## 2014-07-05 DIAGNOSIS — I10 Essential (primary) hypertension: Secondary | ICD-10-CM | POA: Diagnosis not present

## 2014-07-05 DIAGNOSIS — G4710 Hypersomnia, unspecified: Secondary | ICD-10-CM

## 2014-07-05 DIAGNOSIS — Z5181 Encounter for therapeutic drug level monitoring: Secondary | ICD-10-CM

## 2014-07-05 DIAGNOSIS — I480 Paroxysmal atrial fibrillation: Secondary | ICD-10-CM

## 2014-07-05 DIAGNOSIS — E7800 Pure hypercholesterolemia, unspecified: Secondary | ICD-10-CM

## 2014-07-05 DIAGNOSIS — I4891 Unspecified atrial fibrillation: Secondary | ICD-10-CM

## 2014-07-05 LAB — POCT INR: INR: 2.7

## 2014-07-05 NOTE — Patient Instructions (Signed)
Your physician recommends that you schedule a follow-up appointment in: 6 weeks with Dr Tamala Julian   Your physician has recommended that you have a sleep study. This test records several body functions during sleep, including: brain activity, eye movement, oxygen and carbon dioxide blood levels, heart rate and rhythm, breathing rate and rhythm, the flow of air through your mouth and nose, snoring, body muscle movements, and chest and belly movement.  Your physician has recommended that you wear a holter monitor. Holter monitors are medical devices that record the heart's electrical activity. Doctors most often use these monitors to diagnose arrhythmias. Arrhythmias are problems with the speed or rhythm of the heartbeat. The monitor is a small, portable device. You can wear one while you do your normal daily activities. This is usually used to diagnose what is causing palpitations/syncope (passing out).

## 2014-07-05 NOTE — Progress Notes (Signed)
Cardiology Office Note   Date:  07/05/2014   ID:  [name], DOB Jul 01, 1945, MRN [id number]  PCP:  Gavin Pound, MD  Cardiologist:   Sinclair Grooms, MD   No chief complaint on file.     History of Present Illness: [name] is a 69 y.o. female who presents for excessive fatigue. History of atrial fibrillation without symptoms. On chronic Coumadin therapy. Recently she has noted marked exertional fatigue and also fatigue upon awakening. She is sleeping over 10 hours per day. Family states that she snores.  Recently diagnosed with B-12 and thyroid deficiency. She is now on replacement.  Has occasional tightness in her chest when she lies completely flat. There is no tightness with exertion. She denies edema. No orthopnea.    Past Medical History  Diagnosis Date  . Hypertension   . Hypercholesteremia   . Basal cell carcinoma     s/p MOHS surgery in 03/2011  . CAD (coronary artery disease)   . Headache(784.0)     MIGRAINES  . Atrial fibrillation   . Anxiety     Hx of, responds to Wellbutrin  . History of shingles   . Nocturnal muscle cramp     HISTORY IN LEFT LEG  . Hx: UTI (urinary tract infection)   . Deafness in right ear   . PAF (paroxysmal atrial fibrillation)     2 brief episodes of Afib recorded on monitor 02/2007  . PONV (postoperative nausea and vomiting)   . Dysrhythmia     Dr. Daneen Schick  . Cataract   . History of neck pain     Responds to Flexeril  . Acoustic neuroma     Hx of right ear acoustic neuroma, removed in 1999. No hearing in right ear.  [name] TIA (transient ischemic attack) 2011    On coumadin. Asymptomatic, without recurrence.  [name] HLD (hyperlipidemia)     [place] to multiple statins  . Hearing loss     Only in right ear, from an acoustic neuroma. S/P removal in 1999.   [name] History of echocardiogram 2011    Structually normal heart by ECHO with mild to mod MR   . History of nuclear stress test 04/2005    Cardiolite nuc  test without ischemia  . Sinus bradycardia     Past Surgical History  Procedure Laterality Date  . Tonsillectomy    . Craniectomy for excision of acoustic neuroma Right 1999  . Breast biopsy      X 3  . Intraocular lens insertion      Dr. Talbert Forest  . Cholecystectomy N/A 05/26/2013    Procedure: LAPAROSCOPIC CHOLECYSTECTOMY WITH INTRAOPERATIVE CHOLANGIOGRAM;  Surgeon: Imogene Burn. Georgette Dover, MD;  Location: [facility];  Service: General;  Laterality: N/A;  . Mohs surgery  03/2011    Basal cell skin cancer     Current Outpatient Prescriptions  Medication Sig Dispense Refill  . clonazePAM (KLONOPIN) 0.5 MG tablet Take 0.25-0.5 mg by mouth daily as needed for anxiety.     [name] FLUoxetine (PROZAC) 40 MG capsule Take 40 mg by mouth daily.     . fluticasone (FLONASE) 50 MCG/ACT nasal spray Place 2 sprays into the nose daily as needed for allergies.     [name] levothyroxine (SYNTHROID, LEVOTHROID) 50 MCG tablet Take 50 mcg by mouth daily before breakfast.    . loratadine (CLARITIN) 10 MG tablet Take 10 mg by mouth daily as needed for allergies.    . pantoprazole (PROTONIX) 40  MG tablet Take 40 mg by mouth daily.     Vladimir Faster Glycol-Propyl Glycol (SYSTANE) 0.4-0.3 % GEL Place 1 drop into both eyes at bedtime.     . rosuvastatin (CRESTOR) 20 MG tablet Take 20 mg by mouth at bedtime. Cholesterol     Takes 1/2 tablet daily    . Vitamin D, Cholecalciferol, 400 UNITS CAPS Take 800 Units by mouth daily.    [name] warfarin (COUMADIN) 2.5 MG tablet Take as directed by coumadin clinic 40 tablet 3   No current facility-administered medications for this visit.    Allergies:   Atorvastatin; Codeine; and Penicillins    Social History:  The patient  reports that she has never smoked. She has never used smokeless tobacco. She reports that she does not use illicit drugs.   Family History:  The patient's family history includes Alcoholism in her father; CVA in her other; Cancer in her father, maternal aunt, and maternal  grandmother; Diabetes in her mother; Heart disease in her other; High Cholesterol in her mother; Hyperlipidemia in her other; Hypertension in her mother and other; Memory loss in her mother.    ROS:  Please see the history of present illness.   Otherwise, review of systems are positive for fatigue, chest tightness with supine positioning, back pain, easy bruising, dizziness, orthostatic dizziness, and palpitations. Her greatest complaint though is fatigue.   All other systems are reviewed and negative.    PHYSICAL EXAM: VS:  BP 124/60 mmHg  Pulse 52  Ht 5\' 5"  (1.651 m)  Wt 141 lb (63.957 kg)  BMI 23.46 kg/m2 , BMI Body mass index is 23.46 kg/(m^2). GEN: Well nourished, well developed, in no acute distress HEENT: normal Neck: no JVD, carotid bruits, or masses Cardiac: RRR; no murmurs, rubs, or gallops,no edema  Respiratory:  clear to auscultation bilaterally, normal work of breathing GI: soft, nontender, nondistended, + BS MS: no deformity or atrophy Skin: warm and dry, no rash Neuro:  Strength and sensation are intact Psych: euthymic mood, full affect   EKG:  EKG is ordered today. The ekg ordered today demonstrates sinus bradycardia with precordial T-wave inversion   Recent Labs: 12/26/2013: ALT 17    Lipid Panel    Component Value Date/Time   CHOL 181 12/26/2013 0824   TRIG 224.0* 12/26/2013 0824   HDL 46.00 12/26/2013 0824   CHOLHDL 4 12/26/2013 0824   VLDL 44.8* 12/26/2013 0824   LDLCALC 68 05/09/2013 0940   LDLDIRECT 106.6 12/26/2013 0824      Wt Readings from Last 3 Encounters:  07/05/14 141 lb (63.957 kg)  10/21/13 138 lb (62.596 kg)  06/17/13 133 lb 6.4 oz (60.51 kg)      Other studies Reviewed: Additional studies/ records that were reviewed today include: None.   ASSESSMENT AND PLAN:  Paroxysmal atrial fibrillation with associated bradycardia: Rule out the tachybradycardia syndrome with 48 hour Holter  Essential hypertension:  controlled  Hypersomnolence: Rule out sleep apnea. There is a history of snoring.  Encounter for therapeutic drug monitoring:     Current medicines are reviewed at length with the patient today.  The patient does not have concerns regarding medicines.  The following changes have been made:  We will do a sleep study to rule out  sleep apnea. We'll also do a 48 hour Holter monitor to investigate chronotropic response and frequency of atrial fibrillation.  Labs/ tests ordered today include:   Orders Placed This Encounter  Procedures  . EKG 12-Lead  . Holter  monitor - 48 hour  . Split night study     Disposition:   FU with Linard Millers in 6 weeks   Signed, Sinclair Grooms, MD  07/05/2014 10:02 AM    [facility], [place], [place]  [address] Phone: [phone]; Fax: [phone]

## 2014-07-06 DIAGNOSIS — Z419 Encounter for procedure for purposes other than remedying health state, unspecified: Secondary | ICD-10-CM | POA: Diagnosis not present

## 2014-07-06 DIAGNOSIS — L820 Inflamed seborrheic keratosis: Secondary | ICD-10-CM | POA: Diagnosis not present

## 2014-07-06 DIAGNOSIS — L910 Hypertrophic scar: Secondary | ICD-10-CM | POA: Diagnosis not present

## 2014-07-12 ENCOUNTER — Encounter: Payer: Self-pay | Admitting: *Deleted

## 2014-07-12 ENCOUNTER — Encounter (INDEPENDENT_AMBULATORY_CARE_PROVIDER_SITE_OTHER): Payer: Medicare Other

## 2014-07-12 DIAGNOSIS — I480 Paroxysmal atrial fibrillation: Secondary | ICD-10-CM | POA: Diagnosis not present

## 2014-07-12 NOTE — Progress Notes (Signed)
Patient ID: [name], female   DOB: [birth year], 69 y.o.   MRN: [id number] [facility] 48 hour holter monitor applied to patient.

## 2014-07-13 ENCOUNTER — Ambulatory Visit (HOSPITAL_BASED_OUTPATIENT_CLINIC_OR_DEPARTMENT_OTHER): Payer: Medicare Other | Attending: Interventional Cardiology

## 2014-07-13 VITALS — Ht 65.0 in | Wt 140.0 lb

## 2014-07-13 DIAGNOSIS — R0683 Snoring: Secondary | ICD-10-CM | POA: Insufficient documentation

## 2014-07-13 DIAGNOSIS — G4761 Periodic limb movement disorder: Secondary | ICD-10-CM | POA: Diagnosis not present

## 2014-07-13 DIAGNOSIS — G4719 Other hypersomnia: Secondary | ICD-10-CM | POA: Diagnosis present

## 2014-07-13 DIAGNOSIS — G4710 Hypersomnia, unspecified: Secondary | ICD-10-CM

## 2014-07-16 ENCOUNTER — Telehealth: Payer: Self-pay | Admitting: Cardiology

## 2014-07-16 NOTE — Telephone Encounter (Signed)
Please let patient know that she does not have any evidence of sleep apnea.  She did have moderate snoring and her PCP could consider referral to ENT for this.  She had prolonged onset to REM sleep which can be caused by her Prozac.  She also had increased leg movements during the study.

## 2014-07-16 NOTE — Sleep Study (Addendum)
   NAME: [name] DATE OF BIRTH:  1945-08-01 MEDICAL RECORD NUMBER [id number]  LOCATION: [facility]  PHYSICIAN: TURNER,TRACI R  DATE OF STUDY: 07/13/2014  SLEEP STUDY TYPE: Nocturnal Polysomnogram               REFERRING PHYSICIAN: Belva Crome, MD  INDICATION FOR STUDY: excessive daytime fatigue and sleepiness, snoring, headaches  EPWORTH SLEEPINESS SCORE: 7 HEIGHT: 5\' 5"  (165.1 cm)  WEIGHT: 140 lb (63.504 kg)    Body mass index is 23.3 kg/(m^2).  NECK SIZE: 14 in.  MEDICATIONS: Reviewed in the chart  SLEEP ARCHITECTURE: The patient slept for a total of 314 minutes with no slow wave sleep and 46 minutes of REM sleep.  The onset to sleep latency was delayed at 31 minutes.  The onset to REM sleep latency was delayed at 162 minutes.  The sleep efficiency was reduced at 74%.    RESPIRATORY DATA: There were 2 obstructive apneas and 4 hypopneas.  All events occurred in NREM sleep in the supine position.  The AHI was 1.1 events per hour.  OXYGEN DATA: The average oxygen saturation was 94%.  The lowest oxygen saturation was 88%.  The cumulative amount of time spent with oxygen saturations below 88% was 1.1 minutes.    CARDIAC DATA: There was a lot of wandering baseline artifact on the EKG tracing making assessment of cardiac rhythm difficult. There were no discernable P waves making atrial fibrillation more likely but the rhythm was fairly regular.  The average heart rate was 55 bpm.  The highest heart rate was 99 bpm and the lowest heart rate was 38 bpm.     MOVEMENT/PARASOMNIA: There were an increased number of periodic limb movements during the study with a PLMS index of 30 movements per hour.  There were no REM sleep behavior disorders.    IMPRESSION/ RECOMMENDATION:   1.  No evidence of sleep disordered breathing was noted.  The AHI was normal at 1.1 event per hour. 2.  Periodic Limb Movement disorder with an elevated PLMS index of 30 movements per  hour.  The arousal index from PLMS was 2 arousals per hour.  The patient should be questioned on symptoms of restless legs syndrome. 3.  Reduced sleep efficiency with an increase number of spontaneous arousals from sleep.    4.  Abnormal sleep architecture with no slow wave sleep and a prolonged latency to REM sleep.  The patient is on an SSRI which is known to suppress REM sleep and prolong onset to REM sleep.   5.  Moderate snoring was noted.  The patient may benefit from ENT referral to assess for surgical causes of snoring. 6.  The patient should be counseled in good sleep hygiene.   7.  Possible underlying atrial fibrillation noted.  There were no discernable P waves although the rhythm was regular.  Signed: Sueanne Margarita Diplomate, American Board of Sleep Medicine  ELECTRONICALLY SIGNED ON:  07/16/2014, 12:24 PM [place] PH: [phone]   FX: [phone] [place]

## 2014-07-19 ENCOUNTER — Telehealth: Payer: Self-pay | Admitting: Cardiology

## 2014-07-19 NOTE — Telephone Encounter (Signed)
Follow Up  Pt returning Bethany's phone call. Please call back and discuss.

## 2014-07-19 NOTE — Telephone Encounter (Signed)
New message      Returning Bethany's call

## 2014-07-19 NOTE — Telephone Encounter (Signed)
LM for patient to call back.

## 2014-07-20 ENCOUNTER — Encounter: Payer: Self-pay | Admitting: Cardiology

## 2014-07-20 NOTE — Telephone Encounter (Signed)
This encounter was created in error - please disregard.

## 2014-07-20 NOTE — Telephone Encounter (Signed)
New message        Pt is inquiring about the sleep clinic

## 2014-07-20 NOTE — Telephone Encounter (Signed)
Informed patient of results and verbal understanding expressed.    Per patient request, forwarded to PCP and Dr. Tamala Julian.

## 2014-07-20 NOTE — Telephone Encounter (Signed)
Follow Up  Pt called for sleep study results

## 2014-07-21 NOTE — Telephone Encounter (Signed)
Pt has been informed - see phone note below.

## 2014-07-25 ENCOUNTER — Telehealth: Payer: Self-pay

## 2014-07-25 DIAGNOSIS — I480 Paroxysmal atrial fibrillation: Secondary | ICD-10-CM

## 2014-07-25 NOTE — Telephone Encounter (Signed)
Called to give pt holter monitor.lmtcb

## 2014-07-27 NOTE — Telephone Encounter (Signed)
Called to give pt Dr.Smith recommendations lmtcb Pt should wear a 30day event monitor

## 2014-07-31 ENCOUNTER — Ambulatory Visit (INDEPENDENT_AMBULATORY_CARE_PROVIDER_SITE_OTHER): Payer: Medicare Other | Admitting: *Deleted

## 2014-07-31 DIAGNOSIS — Z5181 Encounter for therapeutic drug level monitoring: Secondary | ICD-10-CM

## 2014-07-31 DIAGNOSIS — I4891 Unspecified atrial fibrillation: Secondary | ICD-10-CM

## 2014-07-31 DIAGNOSIS — I480 Paroxysmal atrial fibrillation: Secondary | ICD-10-CM | POA: Diagnosis not present

## 2014-07-31 LAB — POCT INR: INR: 2.7

## 2014-08-04 NOTE — Telephone Encounter (Signed)
Pt aware of Dr.Smith's recommendation to wear a 30day event monitor. Adv her a scheduler from our office will call her to schedule. Pt agreeable and verbalized understanding.

## 2014-08-08 ENCOUNTER — Other Ambulatory Visit: Payer: Self-pay | Admitting: Interventional Cardiology

## 2014-08-08 ENCOUNTER — Encounter: Payer: Self-pay | Admitting: *Deleted

## 2014-08-08 ENCOUNTER — Ambulatory Visit (INDEPENDENT_AMBULATORY_CARE_PROVIDER_SITE_OTHER): Payer: Medicare Other

## 2014-08-08 DIAGNOSIS — I480 Paroxysmal atrial fibrillation: Secondary | ICD-10-CM

## 2014-08-08 NOTE — Progress Notes (Signed)
Patient ID: [name], female   DOB: 29-Aug-1945, 69 y.o.   MRN: [id number] [facility] 30 day cardiac event monitor applied to patient.

## 2014-08-16 ENCOUNTER — Ambulatory Visit: Payer: Medicare Other | Admitting: Interventional Cardiology

## 2014-08-16 DIAGNOSIS — M7742 Metatarsalgia, left foot: Secondary | ICD-10-CM | POA: Diagnosis not present

## 2014-08-16 DIAGNOSIS — B351 Tinea unguium: Secondary | ICD-10-CM | POA: Diagnosis not present

## 2014-08-16 DIAGNOSIS — M2550 Pain in unspecified joint: Secondary | ICD-10-CM | POA: Diagnosis not present

## 2014-09-05 ENCOUNTER — Ambulatory Visit (INDEPENDENT_AMBULATORY_CARE_PROVIDER_SITE_OTHER): Payer: Medicare Other | Admitting: *Deleted

## 2014-09-05 DIAGNOSIS — I4891 Unspecified atrial fibrillation: Secondary | ICD-10-CM | POA: Diagnosis not present

## 2014-09-05 DIAGNOSIS — I480 Paroxysmal atrial fibrillation: Secondary | ICD-10-CM

## 2014-09-05 DIAGNOSIS — Z5181 Encounter for therapeutic drug level monitoring: Secondary | ICD-10-CM

## 2014-09-05 LAB — POCT INR: INR: 2.1

## 2014-09-10 ENCOUNTER — Encounter (HOSPITAL_BASED_OUTPATIENT_CLINIC_OR_DEPARTMENT_OTHER): Payer: Medicare Other

## 2014-09-11 DIAGNOSIS — M8580 Other specified disorders of bone density and structure, unspecified site: Secondary | ICD-10-CM | POA: Diagnosis not present

## 2014-09-11 DIAGNOSIS — I4891 Unspecified atrial fibrillation: Secondary | ICD-10-CM | POA: Diagnosis not present

## 2014-09-11 DIAGNOSIS — I10 Essential (primary) hypertension: Secondary | ICD-10-CM | POA: Diagnosis not present

## 2014-09-11 DIAGNOSIS — K219 Gastro-esophageal reflux disease without esophagitis: Secondary | ICD-10-CM | POA: Diagnosis not present

## 2014-09-11 DIAGNOSIS — Z0000 Encounter for general adult medical examination without abnormal findings: Secondary | ICD-10-CM | POA: Diagnosis not present

## 2014-09-11 DIAGNOSIS — Z13220 Encounter for screening for lipoid disorders: Secondary | ICD-10-CM | POA: Diagnosis not present

## 2014-09-11 DIAGNOSIS — J309 Allergic rhinitis, unspecified: Secondary | ICD-10-CM | POA: Diagnosis not present

## 2014-09-11 DIAGNOSIS — Z23 Encounter for immunization: Secondary | ICD-10-CM | POA: Diagnosis not present

## 2014-09-11 DIAGNOSIS — R5383 Other fatigue: Secondary | ICD-10-CM | POA: Diagnosis not present

## 2014-09-11 DIAGNOSIS — Z131 Encounter for screening for diabetes mellitus: Secondary | ICD-10-CM | POA: Diagnosis not present

## 2014-09-11 DIAGNOSIS — E039 Hypothyroidism, unspecified: Secondary | ICD-10-CM | POA: Diagnosis not present

## 2014-09-11 DIAGNOSIS — F419 Anxiety disorder, unspecified: Secondary | ICD-10-CM | POA: Diagnosis not present

## 2014-09-12 ENCOUNTER — Telehealth: Payer: Self-pay | Admitting: Interventional Cardiology

## 2014-09-12 NOTE — Telephone Encounter (Signed)
Informed patient that results from Monitor study is still being processed and that when completed they will be sent to Dr. Tamala Julian. At that time, he will direct [place] to notify her of results or he will call her himself. Patient verbalized understanding and stated she will await a call back later this week.

## 2014-09-12 NOTE — Telephone Encounter (Signed)
New problem    Pt calling about monitor results.

## 2014-09-14 ENCOUNTER — Telehealth: Payer: Self-pay | Admitting: *Deleted

## 2014-09-14 NOTE — Telephone Encounter (Signed)
Pt called stating she forgot to take her coumadin last night which was coumadin 2.5mg  instructed to take extra 1/2 tablet (1.25mg ) tonight and she states understanding. Also changed her appt time as her husband is having surgery on the date she was scheduled to have her next INR checked

## 2014-09-22 ENCOUNTER — Telehealth: Payer: Self-pay | Admitting: Interventional Cardiology

## 2014-09-22 NOTE — Telephone Encounter (Signed)
Follow Up ° °Pt returning call from earlier. Please call. °

## 2014-09-22 NOTE — Telephone Encounter (Signed)
Returned pt call. Called to give pt cardiac monitor results. lmtcb

## 2014-09-22 NOTE — Telephone Encounter (Signed)
Pt aware of cardiac monitor results. -Frequent Brady into the 45bpm range -No Afib -No action required Pt verbalized understanding

## 2014-09-22 NOTE — Telephone Encounter (Signed)
New message  Pt called states that she turned in the holter on 06/03 or prior to that and she yet to retrieve the results/sr

## 2014-10-02 ENCOUNTER — Other Ambulatory Visit: Payer: Self-pay

## 2014-10-06 ENCOUNTER — Ambulatory Visit (INDEPENDENT_AMBULATORY_CARE_PROVIDER_SITE_OTHER): Payer: Medicare Other | Admitting: *Deleted

## 2014-10-06 DIAGNOSIS — Z5181 Encounter for therapeutic drug level monitoring: Secondary | ICD-10-CM

## 2014-10-06 DIAGNOSIS — I480 Paroxysmal atrial fibrillation: Secondary | ICD-10-CM | POA: Diagnosis not present

## 2014-10-06 DIAGNOSIS — I4891 Unspecified atrial fibrillation: Secondary | ICD-10-CM

## 2014-10-06 LAB — POCT INR: INR: 2.5

## 2014-10-13 DIAGNOSIS — B351 Tinea unguium: Secondary | ICD-10-CM | POA: Diagnosis not present

## 2014-10-13 DIAGNOSIS — E039 Hypothyroidism, unspecified: Secondary | ICD-10-CM | POA: Diagnosis not present

## 2014-10-13 DIAGNOSIS — F419 Anxiety disorder, unspecified: Secondary | ICD-10-CM | POA: Diagnosis not present

## 2014-10-20 ENCOUNTER — Ambulatory Visit (INDEPENDENT_AMBULATORY_CARE_PROVIDER_SITE_OTHER): Payer: Medicare Other

## 2014-10-20 DIAGNOSIS — I480 Paroxysmal atrial fibrillation: Secondary | ICD-10-CM

## 2014-10-20 DIAGNOSIS — Z5181 Encounter for therapeutic drug level monitoring: Secondary | ICD-10-CM | POA: Diagnosis not present

## 2014-10-20 DIAGNOSIS — I4891 Unspecified atrial fibrillation: Secondary | ICD-10-CM | POA: Diagnosis not present

## 2014-10-20 LAB — POCT INR: INR: 2.1

## 2014-11-13 DIAGNOSIS — J309 Allergic rhinitis, unspecified: Secondary | ICD-10-CM | POA: Diagnosis not present

## 2014-11-13 DIAGNOSIS — E780 Pure hypercholesterolemia: Secondary | ICD-10-CM | POA: Diagnosis not present

## 2014-11-13 DIAGNOSIS — Z131 Encounter for screening for diabetes mellitus: Secondary | ICD-10-CM | POA: Diagnosis not present

## 2014-11-13 DIAGNOSIS — Z0000 Encounter for general adult medical examination without abnormal findings: Secondary | ICD-10-CM | POA: Diagnosis not present

## 2014-11-13 DIAGNOSIS — K219 Gastro-esophageal reflux disease without esophagitis: Secondary | ICD-10-CM | POA: Diagnosis not present

## 2014-11-13 DIAGNOSIS — E039 Hypothyroidism, unspecified: Secondary | ICD-10-CM | POA: Diagnosis not present

## 2014-11-13 DIAGNOSIS — M8580 Other specified disorders of bone density and structure, unspecified site: Secondary | ICD-10-CM | POA: Diagnosis not present

## 2014-11-13 DIAGNOSIS — I10 Essential (primary) hypertension: Secondary | ICD-10-CM | POA: Diagnosis not present

## 2014-11-13 DIAGNOSIS — I4891 Unspecified atrial fibrillation: Secondary | ICD-10-CM | POA: Diagnosis not present

## 2014-11-13 DIAGNOSIS — Z136 Encounter for screening for cardiovascular disorders: Secondary | ICD-10-CM | POA: Diagnosis not present

## 2014-11-13 DIAGNOSIS — B351 Tinea unguium: Secondary | ICD-10-CM | POA: Diagnosis not present

## 2014-11-13 DIAGNOSIS — R5383 Other fatigue: Secondary | ICD-10-CM | POA: Diagnosis not present

## 2014-11-13 DIAGNOSIS — F419 Anxiety disorder, unspecified: Secondary | ICD-10-CM | POA: Diagnosis not present

## 2014-11-14 ENCOUNTER — Ambulatory Visit (INDEPENDENT_AMBULATORY_CARE_PROVIDER_SITE_OTHER): Payer: Medicare Other | Admitting: *Deleted

## 2014-11-14 DIAGNOSIS — Z5181 Encounter for therapeutic drug level monitoring: Secondary | ICD-10-CM | POA: Diagnosis not present

## 2014-11-14 DIAGNOSIS — H04123 Dry eye syndrome of bilateral lacrimal glands: Secondary | ICD-10-CM | POA: Diagnosis not present

## 2014-11-14 DIAGNOSIS — I4891 Unspecified atrial fibrillation: Secondary | ICD-10-CM | POA: Diagnosis not present

## 2014-11-14 DIAGNOSIS — I480 Paroxysmal atrial fibrillation: Secondary | ICD-10-CM

## 2014-11-14 LAB — POCT INR: INR: 2

## 2014-12-07 DIAGNOSIS — H2513 Age-related nuclear cataract, bilateral: Secondary | ICD-10-CM | POA: Diagnosis not present

## 2014-12-12 ENCOUNTER — Other Ambulatory Visit: Payer: Self-pay | Admitting: Interventional Cardiology

## 2014-12-25 DIAGNOSIS — H02005 Unspecified entropion of left lower eyelid: Secondary | ICD-10-CM | POA: Diagnosis not present

## 2014-12-25 DIAGNOSIS — H02006 Unspecified entropion of left eye, unspecified eyelid: Secondary | ICD-10-CM | POA: Diagnosis not present

## 2014-12-26 ENCOUNTER — Ambulatory Visit (INDEPENDENT_AMBULATORY_CARE_PROVIDER_SITE_OTHER): Payer: Medicare Other | Admitting: *Deleted

## 2014-12-26 DIAGNOSIS — I480 Paroxysmal atrial fibrillation: Secondary | ICD-10-CM

## 2014-12-26 DIAGNOSIS — I4891 Unspecified atrial fibrillation: Secondary | ICD-10-CM | POA: Diagnosis not present

## 2014-12-26 DIAGNOSIS — Z5181 Encounter for therapeutic drug level monitoring: Secondary | ICD-10-CM

## 2014-12-26 LAB — POCT INR: INR: 2.1

## 2015-01-12 DIAGNOSIS — E039 Hypothyroidism, unspecified: Secondary | ICD-10-CM | POA: Diagnosis not present

## 2015-01-12 DIAGNOSIS — F419 Anxiety disorder, unspecified: Secondary | ICD-10-CM | POA: Diagnosis not present

## 2015-01-15 DIAGNOSIS — S60458A Superficial foreign body of other finger, initial encounter: Secondary | ICD-10-CM | POA: Diagnosis not present

## 2015-01-15 DIAGNOSIS — L820 Inflamed seborrheic keratosis: Secondary | ICD-10-CM | POA: Diagnosis not present

## 2015-01-22 DIAGNOSIS — H02042 Spastic entropion of right lower eyelid: Secondary | ICD-10-CM | POA: Diagnosis not present

## 2015-01-22 DIAGNOSIS — H02005 Unspecified entropion of left lower eyelid: Secondary | ICD-10-CM | POA: Diagnosis not present

## 2015-01-22 DIAGNOSIS — H02045 Spastic entropion of left lower eyelid: Secondary | ICD-10-CM | POA: Diagnosis not present

## 2015-01-22 DIAGNOSIS — H02002 Unspecified entropion of right lower eyelid: Secondary | ICD-10-CM | POA: Diagnosis not present

## 2015-01-29 ENCOUNTER — Ambulatory Visit (INDEPENDENT_AMBULATORY_CARE_PROVIDER_SITE_OTHER): Payer: Medicare Other | Admitting: *Deleted

## 2015-01-29 DIAGNOSIS — I4891 Unspecified atrial fibrillation: Secondary | ICD-10-CM

## 2015-01-29 DIAGNOSIS — I480 Paroxysmal atrial fibrillation: Secondary | ICD-10-CM

## 2015-01-29 DIAGNOSIS — Z5181 Encounter for therapeutic drug level monitoring: Secondary | ICD-10-CM | POA: Diagnosis not present

## 2015-01-29 LAB — POCT INR: INR: 1.6

## 2015-02-06 ENCOUNTER — Ambulatory Visit (INDEPENDENT_AMBULATORY_CARE_PROVIDER_SITE_OTHER): Payer: Medicare Other | Admitting: *Deleted

## 2015-02-06 DIAGNOSIS — I4891 Unspecified atrial fibrillation: Secondary | ICD-10-CM

## 2015-02-06 DIAGNOSIS — I480 Paroxysmal atrial fibrillation: Secondary | ICD-10-CM | POA: Diagnosis not present

## 2015-02-06 DIAGNOSIS — Z5181 Encounter for therapeutic drug level monitoring: Secondary | ICD-10-CM | POA: Diagnosis not present

## 2015-02-06 LAB — POCT INR: INR: 2.2

## 2015-02-12 DIAGNOSIS — H16223 Keratoconjunctivitis sicca, not specified as Sjogren's, bilateral: Secondary | ICD-10-CM | POA: Diagnosis not present

## 2015-02-28 ENCOUNTER — Ambulatory Visit (INDEPENDENT_AMBULATORY_CARE_PROVIDER_SITE_OTHER): Payer: Medicare Other | Admitting: Pharmacist

## 2015-02-28 DIAGNOSIS — Z5181 Encounter for therapeutic drug level monitoring: Secondary | ICD-10-CM

## 2015-02-28 DIAGNOSIS — I480 Paroxysmal atrial fibrillation: Secondary | ICD-10-CM | POA: Diagnosis not present

## 2015-02-28 DIAGNOSIS — I4891 Unspecified atrial fibrillation: Secondary | ICD-10-CM | POA: Diagnosis not present

## 2015-02-28 LAB — POCT INR: INR: 2.2

## 2015-03-08 ENCOUNTER — Telehealth: Payer: Self-pay | Admitting: *Deleted

## 2015-03-08 NOTE — Telephone Encounter (Signed)
Patient called to inform [facility] that she is using Zovirax topical cream on her lips and wanted ensure it is safe to use. She will be using it for a couple days. Advised that the medication is safe to use with Coumadin and she verbalized understanding.  Advised to call back with any other new meds and she verbalized understanding.

## 2015-03-20 DIAGNOSIS — Z23 Encounter for immunization: Secondary | ICD-10-CM | POA: Diagnosis not present

## 2015-03-28 ENCOUNTER — Ambulatory Visit (INDEPENDENT_AMBULATORY_CARE_PROVIDER_SITE_OTHER): Payer: Medicare Other | Admitting: *Deleted

## 2015-03-28 DIAGNOSIS — I4891 Unspecified atrial fibrillation: Secondary | ICD-10-CM | POA: Diagnosis not present

## 2015-03-28 DIAGNOSIS — I480 Paroxysmal atrial fibrillation: Secondary | ICD-10-CM

## 2015-03-28 DIAGNOSIS — Z5181 Encounter for therapeutic drug level monitoring: Secondary | ICD-10-CM

## 2015-03-28 LAB — POCT INR: INR: 1.9

## 2015-04-18 ENCOUNTER — Other Ambulatory Visit: Payer: Self-pay | Admitting: Interventional Cardiology

## 2015-04-23 DIAGNOSIS — H26492 Other secondary cataract, left eye: Secondary | ICD-10-CM | POA: Diagnosis not present

## 2015-04-25 ENCOUNTER — Ambulatory Visit (INDEPENDENT_AMBULATORY_CARE_PROVIDER_SITE_OTHER): Payer: Medicare Other | Admitting: *Deleted

## 2015-04-25 DIAGNOSIS — I4891 Unspecified atrial fibrillation: Secondary | ICD-10-CM | POA: Diagnosis not present

## 2015-04-25 DIAGNOSIS — Z5181 Encounter for therapeutic drug level monitoring: Secondary | ICD-10-CM | POA: Diagnosis not present

## 2015-04-25 DIAGNOSIS — I480 Paroxysmal atrial fibrillation: Secondary | ICD-10-CM | POA: Diagnosis not present

## 2015-04-25 LAB — POCT INR: INR: 1.8

## 2015-05-09 ENCOUNTER — Ambulatory Visit (INDEPENDENT_AMBULATORY_CARE_PROVIDER_SITE_OTHER): Payer: Medicare Other | Admitting: Internal Medicine

## 2015-05-09 DIAGNOSIS — I480 Paroxysmal atrial fibrillation: Secondary | ICD-10-CM | POA: Diagnosis not present

## 2015-05-09 DIAGNOSIS — Z5181 Encounter for therapeutic drug level monitoring: Secondary | ICD-10-CM | POA: Diagnosis not present

## 2015-05-09 LAB — PROTIME-INR: INR: 2 — AB (ref 0.9–1.1)

## 2015-05-23 DIAGNOSIS — L308 Other specified dermatitis: Secondary | ICD-10-CM | POA: Diagnosis not present

## 2015-05-23 DIAGNOSIS — L821 Other seborrheic keratosis: Secondary | ICD-10-CM | POA: Diagnosis not present

## 2015-05-24 ENCOUNTER — Ambulatory Visit (INDEPENDENT_AMBULATORY_CARE_PROVIDER_SITE_OTHER): Payer: Medicare Other | Admitting: *Deleted

## 2015-05-24 DIAGNOSIS — I4891 Unspecified atrial fibrillation: Secondary | ICD-10-CM | POA: Diagnosis not present

## 2015-05-24 DIAGNOSIS — I480 Paroxysmal atrial fibrillation: Secondary | ICD-10-CM | POA: Diagnosis not present

## 2015-05-24 DIAGNOSIS — Z5181 Encounter for therapeutic drug level monitoring: Secondary | ICD-10-CM

## 2015-05-24 LAB — POCT INR: INR: 2.7

## 2015-06-20 DIAGNOSIS — J019 Acute sinusitis, unspecified: Secondary | ICD-10-CM | POA: Diagnosis not present

## 2015-06-20 DIAGNOSIS — F419 Anxiety disorder, unspecified: Secondary | ICD-10-CM | POA: Diagnosis not present

## 2015-06-22 ENCOUNTER — Ambulatory Visit (INDEPENDENT_AMBULATORY_CARE_PROVIDER_SITE_OTHER): Payer: Medicare Other | Admitting: *Deleted

## 2015-06-22 DIAGNOSIS — Z5181 Encounter for therapeutic drug level monitoring: Secondary | ICD-10-CM

## 2015-06-22 DIAGNOSIS — I480 Paroxysmal atrial fibrillation: Secondary | ICD-10-CM

## 2015-06-22 DIAGNOSIS — I4891 Unspecified atrial fibrillation: Secondary | ICD-10-CM

## 2015-06-22 LAB — POCT INR: INR: 2.6

## 2015-06-27 DIAGNOSIS — E039 Hypothyroidism, unspecified: Secondary | ICD-10-CM | POA: Diagnosis not present

## 2015-06-27 DIAGNOSIS — E785 Hyperlipidemia, unspecified: Secondary | ICD-10-CM | POA: Diagnosis not present

## 2015-07-09 DIAGNOSIS — H26491 Other secondary cataract, right eye: Secondary | ICD-10-CM | POA: Diagnosis not present

## 2015-07-10 ENCOUNTER — Other Ambulatory Visit: Payer: Self-pay

## 2015-07-10 DIAGNOSIS — Z1231 Encounter for screening mammogram for malignant neoplasm of breast: Secondary | ICD-10-CM

## 2015-07-13 ENCOUNTER — Ambulatory Visit
Admission: RE | Admit: 2015-07-13 | Discharge: 2015-07-13 | Disposition: A | Discharge status: Home / Self Care | Payer: Medicare Other | Source: Ambulatory Visit

## 2015-07-13 DIAGNOSIS — Z1231 Encounter for screening mammogram for malignant neoplasm of breast: Secondary | ICD-10-CM

## 2015-07-19 ENCOUNTER — Ambulatory Visit (INDEPENDENT_AMBULATORY_CARE_PROVIDER_SITE_OTHER): Payer: Medicare Other | Admitting: Pharmacist

## 2015-07-19 DIAGNOSIS — I4891 Unspecified atrial fibrillation: Secondary | ICD-10-CM

## 2015-07-19 DIAGNOSIS — I480 Paroxysmal atrial fibrillation: Secondary | ICD-10-CM | POA: Diagnosis not present

## 2015-07-19 DIAGNOSIS — Z5181 Encounter for therapeutic drug level monitoring: Secondary | ICD-10-CM

## 2015-07-19 LAB — POCT INR: INR: 2.2

## 2015-08-28 ENCOUNTER — Ambulatory Visit (INDEPENDENT_AMBULATORY_CARE_PROVIDER_SITE_OTHER): Payer: Medicare Other | Admitting: *Deleted

## 2015-08-28 DIAGNOSIS — I4891 Unspecified atrial fibrillation: Secondary | ICD-10-CM

## 2015-08-28 DIAGNOSIS — I480 Paroxysmal atrial fibrillation: Secondary | ICD-10-CM | POA: Diagnosis not present

## 2015-08-28 DIAGNOSIS — Z5181 Encounter for therapeutic drug level monitoring: Secondary | ICD-10-CM

## 2015-08-28 LAB — POCT INR: INR: 2.1

## 2015-09-05 ENCOUNTER — Encounter (HOSPITAL_COMMUNITY): Payer: Self-pay | Admitting: Emergency Medicine

## 2015-09-05 ENCOUNTER — Emergency Department (HOSPITAL_COMMUNITY): Payer: No Typology Code available for payment source

## 2015-09-05 ENCOUNTER — Emergency Department (HOSPITAL_COMMUNITY)
Admission: EM | Admit: 2015-09-05 | Discharge: 2015-09-05 | Disposition: A | Discharge status: Home / Self Care | Payer: No Typology Code available for payment source | Attending: Emergency Medicine | Admitting: Emergency Medicine

## 2015-09-05 DIAGNOSIS — Z7901 Long term (current) use of anticoagulants: Secondary | ICD-10-CM | POA: Diagnosis not present

## 2015-09-05 DIAGNOSIS — Z8673 Personal history of transient ischemic attack (TIA), and cerebral infarction without residual deficits: Secondary | ICD-10-CM | POA: Insufficient documentation

## 2015-09-05 DIAGNOSIS — I10 Essential (primary) hypertension: Secondary | ICD-10-CM | POA: Diagnosis not present

## 2015-09-05 DIAGNOSIS — Z91040 Latex allergy status: Secondary | ICD-10-CM | POA: Insufficient documentation

## 2015-09-05 DIAGNOSIS — Y999 Unspecified external cause status: Secondary | ICD-10-CM | POA: Insufficient documentation

## 2015-09-05 DIAGNOSIS — I2510 Atherosclerotic heart disease of native coronary artery without angina pectoris: Secondary | ICD-10-CM | POA: Diagnosis not present

## 2015-09-05 DIAGNOSIS — Z79899 Other long term (current) drug therapy: Secondary | ICD-10-CM | POA: Insufficient documentation

## 2015-09-05 DIAGNOSIS — Y939 Activity, unspecified: Secondary | ICD-10-CM | POA: Insufficient documentation

## 2015-09-05 DIAGNOSIS — S2231XA Fracture of one rib, right side, initial encounter for closed fracture: Secondary | ICD-10-CM | POA: Insufficient documentation

## 2015-09-05 DIAGNOSIS — Y92410 Unspecified street and highway as the place of occurrence of the external cause: Secondary | ICD-10-CM | POA: Insufficient documentation

## 2015-09-05 DIAGNOSIS — S299XXA Unspecified injury of thorax, initial encounter: Secondary | ICD-10-CM | POA: Diagnosis not present

## 2015-09-05 DIAGNOSIS — S20301A Unspecified superficial injuries of right front wall of thorax, initial encounter: Secondary | ICD-10-CM | POA: Diagnosis present

## 2015-09-05 DIAGNOSIS — R079 Chest pain, unspecified: Secondary | ICD-10-CM | POA: Diagnosis not present

## 2015-09-05 LAB — URINALYSIS, ROUTINE W REFLEX MICROSCOPIC
Bilirubin Urine: NEGATIVE
GLUCOSE, UA: NEGATIVE mg/dL
Ketones, ur: NEGATIVE mg/dL
Nitrite: NEGATIVE
PROTEIN: NEGATIVE mg/dL
Specific Gravity, Urine: 1.006 (ref 1.005–1.030)
pH: 5.5 (ref 5.0–8.0)

## 2015-09-05 LAB — BASIC METABOLIC PANEL
Anion gap: 4 — ABNORMAL LOW (ref 5–15)
BUN: 19 mg/dL (ref 6–20)
CHLORIDE: 104 mmol/L (ref 101–111)
CO2: 29 mmol/L (ref 22–32)
CREATININE: 0.96 mg/dL (ref 0.44–1.00)
Calcium: 9.2 mg/dL (ref 8.9–10.3)
GFR calc Af Amer: >60 mL/min (ref >60)
GFR calc non Af Amer: 59 mL/min — ABNORMAL LOW (ref >60)
Glucose, Bld: 107 mg/dL — ABNORMAL HIGH (ref 65–99)
Potassium: 4.6 mmol/L (ref 3.5–5.1)
Sodium: 137 mmol/L (ref 135–145)

## 2015-09-05 LAB — URINE MICROSCOPIC-ADD ON

## 2015-09-05 LAB — CBC WITH DIFFERENTIAL/PLATELET
Basophils Absolute: 0 10*3/uL (ref 0.0–0.1)
Basophils Relative: 1 %
EOS ABS: 0.1 10*3/uL (ref 0.0–0.7)
EOS PCT: 2 %
HCT: 36.9 % (ref 36.0–46.0)
HEMOGLOBIN: 12.2 g/dL (ref 12.0–15.0)
LYMPHS ABS: 1.5 10*3/uL (ref 0.7–4.0)
Lymphocytes Relative: 26 %
MCH: 26.5 pg (ref 26.0–34.0)
MCHC: 33.1 g/dL (ref 30.0–36.0)
MCV: 80 fL (ref 78.0–100.0)
MONOS PCT: 6 %
Monocytes Absolute: 0.4 10*3/uL (ref 0.1–1.0)
Neutro Abs: 3.7 10*3/uL (ref 1.7–7.7)
Neutrophils Relative %: 65 %
PLATELETS: 298 10*3/uL (ref 150–400)
RBC: 4.61 MIL/uL (ref 3.87–5.11)
RDW: 15 % (ref 11.5–15.5)
WBC: 5.6 10*3/uL (ref 4.0–10.5)

## 2015-09-05 LAB — PROTIME-INR
INR: 2.45 — ABNORMAL HIGH (ref 0.00–1.49)
Prothrombin Time: 25.5 seconds — ABNORMAL HIGH (ref 11.6–15.2)

## 2015-09-05 LAB — TROPONIN I

## 2015-09-05 MED ORDER — OXYCODONE-ACETAMINOPHEN 5-325 MG PO TABS
2.0000 | ORAL_TABLET | Freq: Once | ORAL | Status: AC
Start: 2015-09-05 — End: 2015-09-05
  Administered 2015-09-05: 2 via ORAL
  Filled 2015-09-05: qty 2

## 2015-09-05 MED ORDER — IOPAMIDOL (ISOVUE-300) INJECTION 61%
75.0000 mL | Freq: Once | INTRAVENOUS | Status: AC | PRN
  Administered 2015-09-05: 75 mL via INTRAVENOUS

## 2015-09-05 MED ORDER — OXYCODONE-ACETAMINOPHEN 5-325 MG PO TABS: 1.0000 | ORAL_TABLET | Freq: Four times a day (QID) | ORAL | Status: DC | PRN
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
## 2015-09-05 NOTE — ED Notes (Signed)
Patient was alert, oriented and stable upon discharge. RN went over AVS and patient had no further questions.  
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
## 2015-09-05 NOTE — ED Notes (Signed)
Pt ambulated to RR assisted

## 2015-09-14 ENCOUNTER — Ambulatory Visit (INDEPENDENT_AMBULATORY_CARE_PROVIDER_SITE_OTHER): Payer: Medicare Other

## 2015-09-14 DIAGNOSIS — I4891 Unspecified atrial fibrillation: Secondary | ICD-10-CM | POA: Diagnosis not present

## 2015-09-14 DIAGNOSIS — I480 Paroxysmal atrial fibrillation: Secondary | ICD-10-CM | POA: Diagnosis not present

## 2015-09-14 DIAGNOSIS — Z5181 Encounter for therapeutic drug level monitoring: Secondary | ICD-10-CM

## 2015-09-14 LAB — POCT INR: INR: 2.5

## 2015-10-04 DIAGNOSIS — E039 Hypothyroidism, unspecified: Secondary | ICD-10-CM | POA: Diagnosis not present

## 2015-10-04 DIAGNOSIS — F419 Anxiety disorder, unspecified: Secondary | ICD-10-CM | POA: Diagnosis not present

## 2015-10-18 ENCOUNTER — Other Ambulatory Visit: Payer: Self-pay | Admitting: Interventional Cardiology

## 2015-10-26 ENCOUNTER — Ambulatory Visit (INDEPENDENT_AMBULATORY_CARE_PROVIDER_SITE_OTHER): Payer: Medicare Other | Admitting: Pharmacist

## 2015-10-26 DIAGNOSIS — I4891 Unspecified atrial fibrillation: Secondary | ICD-10-CM | POA: Diagnosis not present

## 2015-10-26 DIAGNOSIS — I480 Paroxysmal atrial fibrillation: Secondary | ICD-10-CM | POA: Diagnosis not present

## 2015-10-26 DIAGNOSIS — Z5181 Encounter for therapeutic drug level monitoring: Secondary | ICD-10-CM | POA: Diagnosis not present

## 2015-10-26 LAB — POCT INR: INR: 2.5

## 2015-11-21 DIAGNOSIS — F419 Anxiety disorder, unspecified: Secondary | ICD-10-CM | POA: Diagnosis not present

## 2015-11-21 DIAGNOSIS — E039 Hypothyroidism, unspecified: Secondary | ICD-10-CM | POA: Diagnosis not present

## 2015-11-21 DIAGNOSIS — E785 Hyperlipidemia, unspecified: Secondary | ICD-10-CM | POA: Diagnosis not present

## 2015-11-21 DIAGNOSIS — Z7901 Long term (current) use of anticoagulants: Secondary | ICD-10-CM | POA: Diagnosis not present

## 2015-11-21 DIAGNOSIS — I4891 Unspecified atrial fibrillation: Secondary | ICD-10-CM | POA: Diagnosis not present

## 2015-12-04 ENCOUNTER — Telehealth: Payer: Self-pay | Admitting: *Deleted

## 2015-12-04 DIAGNOSIS — R399 Unspecified symptoms and signs involving the genitourinary system: Secondary | ICD-10-CM | POA: Diagnosis not present

## 2015-12-04 DIAGNOSIS — Z23 Encounter for immunization: Secondary | ICD-10-CM | POA: Diagnosis not present

## 2015-12-04 NOTE — Telephone Encounter (Signed)
Spoke with pt and she states she is starting today Cipro  250mg  bid for 5 days and also Pyridium and that she did miss a dose of coumadin on Saturday August 26th . Pt instructed to take Cipro as ordered and her coumadin as ordered and that she needs to keep her appt on Friday since she is on Cipro and because of this no additional dose of coumadin given due to starting Cipro Pt states she has also had urine culture done Pt states understanding of above instructions.

## 2015-12-07 ENCOUNTER — Ambulatory Visit (INDEPENDENT_AMBULATORY_CARE_PROVIDER_SITE_OTHER): Payer: Medicare Other | Admitting: Pharmacist

## 2015-12-07 DIAGNOSIS — Z5181 Encounter for therapeutic drug level monitoring: Secondary | ICD-10-CM

## 2015-12-07 DIAGNOSIS — I4891 Unspecified atrial fibrillation: Secondary | ICD-10-CM

## 2015-12-07 DIAGNOSIS — I480 Paroxysmal atrial fibrillation: Secondary | ICD-10-CM

## 2015-12-07 LAB — POCT INR: INR: 2

## 2015-12-11 ENCOUNTER — Telehealth: Payer: Self-pay | Admitting: Pharmacist

## 2015-12-11 NOTE — Telephone Encounter (Signed)
Pt called to report her MD switched her abx from cipro to nitrofurantoin for UTI tx. Advised pt there is no interaction with nitrofurantoin and Coumadin. She will keep her INR check next month and does not need to come in sooner.

## 2016-01-01 DIAGNOSIS — N390 Urinary tract infection, site not specified: Secondary | ICD-10-CM | POA: Diagnosis not present

## 2016-01-17 ENCOUNTER — Ambulatory Visit (INDEPENDENT_AMBULATORY_CARE_PROVIDER_SITE_OTHER): Payer: Medicare Other | Admitting: *Deleted

## 2016-01-17 DIAGNOSIS — I480 Paroxysmal atrial fibrillation: Secondary | ICD-10-CM

## 2016-01-17 DIAGNOSIS — Z5181 Encounter for therapeutic drug level monitoring: Secondary | ICD-10-CM

## 2016-01-17 DIAGNOSIS — I4891 Unspecified atrial fibrillation: Secondary | ICD-10-CM

## 2016-01-17 LAB — POCT INR: INR: 2.6

## 2016-02-04 DIAGNOSIS — E785 Hyperlipidemia, unspecified: Secondary | ICD-10-CM | POA: Diagnosis not present

## 2016-02-04 DIAGNOSIS — Z79899 Other long term (current) drug therapy: Secondary | ICD-10-CM | POA: Diagnosis not present

## 2016-02-26 ENCOUNTER — Ambulatory Visit (INDEPENDENT_AMBULATORY_CARE_PROVIDER_SITE_OTHER): Payer: Medicare Other

## 2016-02-26 DIAGNOSIS — Z5181 Encounter for therapeutic drug level monitoring: Secondary | ICD-10-CM | POA: Diagnosis not present

## 2016-02-26 DIAGNOSIS — I4891 Unspecified atrial fibrillation: Secondary | ICD-10-CM

## 2016-02-26 DIAGNOSIS — I480 Paroxysmal atrial fibrillation: Secondary | ICD-10-CM

## 2016-02-26 LAB — POCT INR: INR: 2.1

## 2016-02-27 DIAGNOSIS — R5383 Other fatigue: Secondary | ICD-10-CM | POA: Diagnosis not present

## 2016-02-27 DIAGNOSIS — K5900 Constipation, unspecified: Secondary | ICD-10-CM | POA: Diagnosis not present

## 2016-04-07 DIAGNOSIS — Z950 Presence of cardiac pacemaker: Secondary | ICD-10-CM

## 2016-04-07 DIAGNOSIS — C439 Malignant melanoma of skin, unspecified: Secondary | ICD-10-CM

## 2016-04-07 HISTORY — DX: Presence of cardiac pacemaker: Z95.0

## 2016-04-07 HISTORY — DX: Malignant melanoma of skin, unspecified: C43.9

## 2016-04-09 ENCOUNTER — Ambulatory Visit (INDEPENDENT_AMBULATORY_CARE_PROVIDER_SITE_OTHER): Payer: Medicare Other | Admitting: Pharmacist

## 2016-04-09 DIAGNOSIS — Z5181 Encounter for therapeutic drug level monitoring: Secondary | ICD-10-CM

## 2016-04-09 DIAGNOSIS — R946 Abnormal results of thyroid function studies: Secondary | ICD-10-CM | POA: Diagnosis not present

## 2016-04-09 DIAGNOSIS — I4891 Unspecified atrial fibrillation: Secondary | ICD-10-CM

## 2016-04-09 DIAGNOSIS — I480 Paroxysmal atrial fibrillation: Secondary | ICD-10-CM | POA: Diagnosis not present

## 2016-04-09 LAB — POCT INR: INR: 2.2

## 2016-04-14 ENCOUNTER — Ambulatory Visit: Payer: Medicare Other | Admitting: Interventional Cardiology

## 2016-05-09 DIAGNOSIS — E785 Hyperlipidemia, unspecified: Secondary | ICD-10-CM | POA: Diagnosis not present

## 2016-05-09 DIAGNOSIS — F419 Anxiety disorder, unspecified: Secondary | ICD-10-CM | POA: Diagnosis not present

## 2016-05-20 ENCOUNTER — Ambulatory Visit (INDEPENDENT_AMBULATORY_CARE_PROVIDER_SITE_OTHER): Payer: Medicare Other | Admitting: *Deleted

## 2016-05-20 DIAGNOSIS — I4891 Unspecified atrial fibrillation: Secondary | ICD-10-CM

## 2016-05-20 DIAGNOSIS — I480 Paroxysmal atrial fibrillation: Secondary | ICD-10-CM | POA: Diagnosis not present

## 2016-05-20 DIAGNOSIS — Z5181 Encounter for therapeutic drug level monitoring: Secondary | ICD-10-CM

## 2016-05-20 LAB — POCT INR: INR: 1.9

## 2016-05-30 ENCOUNTER — Other Ambulatory Visit: Payer: Self-pay | Admitting: Interventional Cardiology

## 2016-06-03 DIAGNOSIS — F418 Other specified anxiety disorders: Secondary | ICD-10-CM | POA: Diagnosis not present

## 2016-06-12 ENCOUNTER — Ambulatory Visit (INDEPENDENT_AMBULATORY_CARE_PROVIDER_SITE_OTHER): Payer: Medicare Other | Admitting: Pharmacist

## 2016-06-12 ENCOUNTER — Encounter: Payer: Self-pay | Admitting: Interventional Cardiology

## 2016-06-12 ENCOUNTER — Ambulatory Visit (INDEPENDENT_AMBULATORY_CARE_PROVIDER_SITE_OTHER): Payer: Medicare Other | Admitting: Interventional Cardiology

## 2016-06-12 ENCOUNTER — Encounter (INDEPENDENT_AMBULATORY_CARE_PROVIDER_SITE_OTHER): Payer: Self-pay

## 2016-06-12 ENCOUNTER — Other Ambulatory Visit: Payer: Self-pay | Admitting: Interventional Cardiology

## 2016-06-12 VITALS — BP 126/84 | HR 53 | Ht 65.0 in | Wt 141.8 lb

## 2016-06-12 DIAGNOSIS — Z5181 Encounter for therapeutic drug level monitoring: Secondary | ICD-10-CM

## 2016-06-12 DIAGNOSIS — I480 Paroxysmal atrial fibrillation: Secondary | ICD-10-CM

## 2016-06-12 DIAGNOSIS — I10 Essential (primary) hypertension: Secondary | ICD-10-CM | POA: Diagnosis not present

## 2016-06-12 LAB — POCT INR: INR: 2.2

## 2016-06-12 NOTE — Progress Notes (Signed)
Cardiology Office Note    Date:  06/12/2016   ID:  [name], DOB 1945/05/27, MRN [id number]  PCP:  Cammy Copa, MD  Cardiologist: Sinclair Grooms, MD   Chief Complaint  Patient presents with  . Atrial Fibrillation    History of Present Illness:  [name] is a 71 y.o. female with history of Paroxysmal atrial fibrillation, essential hypertension,.  She has no complaints. She has occasional palpitations. They do not last long. She has not had syncope. She denies orthopnea, PND, edema, and bleeding on Coumadin. She has had a transient neurological attack which led to rhythm evaluation and identification of brief episodes of atrial fibrillation. She has been on Coumadin since that time. Her problem list contains see diagnosis of "CAD" but I can find no evidence for this in her records.   Past Medical History:  Diagnosis Date  . Acoustic neuroma (HCC)    Hx of right ear acoustic neuroma, removed in 1999. No hearing in right ear.  [name] Anxiety    Hx of, responds to Wellbutrin  . Basal cell carcinoma    s/p MOHS surgery in 03/2011  . Cataract   . Deafness in right ear   . Headache(784.0)    MIGRAINES  . Hearing loss    Only in right ear, from an acoustic neuroma. S/P removal in 1999.   [name] History of neck pain    Responds to Flexeril  . History of shingles   . Hypercholesteremia   . Hypertension   . PAF (paroxysmal atrial fibrillation) ([place])    2 brief episodes of Afib recorded on monitor 02/2007  . PONV (postoperative nausea and vomiting)   . Sinus bradycardia   . TIA (transient ischemic attack) 2011   On coumadin. Asymptomatic, without recurrence.    Past Surgical History:  Procedure Laterality Date  . BREAST BIOPSY     X 3  . CHOLECYSTECTOMY N/A 05/26/2013   Procedure: LAPAROSCOPIC CHOLECYSTECTOMY WITH INTRAOPERATIVE CHOLANGIOGRAM;  Surgeon: Imogene Burn. Georgette Dover, MD;  Location: [place];  Service: General;  Laterality: N/A;  . CRANIECTOMY FOR  EXCISION OF ACOUSTIC NEUROMA Right 1999  . INTRAOCULAR LENS INSERTION     Dr. Talbert Forest  . MOHS SURGERY  03/2011   Basal cell skin cancer  . TONSILLECTOMY      Current Medications: Outpatient Medications Prior to Visit  Medication Sig Dispense Refill  . clonazePAM (KLONOPIN) 0.5 MG tablet Take 0.25-0.5 mg by mouth daily as needed for anxiety.     . cyanocobalamin 1000 MCG tablet Take 100 mcg by mouth daily.    . fluticasone (FLONASE) 50 MCG/ACT nasal spray Place 2 sprays into the nose daily as needed for allergies.     [name] levothyroxine (SYNTHROID, LEVOTHROID) 88 MCG tablet Take 88 mcg by mouth daily before breakfast.    . loratadine (CLARITIN) 10 MG tablet Take 10 mg by mouth daily as needed for allergies.    . Omega-3 Fatty Acids (FISH OIL TRIPLE STRENGTH) 1400 MG CAPS Take 1 capsule by mouth daily.    . pantoprazole (PROTONIX) 40 MG tablet Take 40 mg by mouth daily as needed (acid reflux).     . Vitamin D, Cholecalciferol, 400 UNITS CAPS Take 800 Units by mouth daily.    [name] warfarin (COUMADIN) 2.5 MG tablet TAKE 1-2 TABLETS BY MOUTH DAILY AS DIRECTED BY COUMADIN CLINIC. 45 tablet 1  . buPROPion (WELLBUTRIN XL) 150 MG 24 hr tablet Take 150 mg by mouth daily.    [name]  FLUoxetine (PROZAC) 20 MG capsule Take 20 mg by mouth daily.    [name] oxyCODONE-acetaminophen (PERCOCET) 5-325 MG tablet Take 1-2 tablets by mouth every 6 (six) hours as needed for moderate pain. (Patient not taking: Reported on 06/12/2016) 20 tablet 0   No facility-administered medications prior to visit.      Allergies:   Atorvastatin; Codeine; Latex; and Penicillins   Social History   Social History  . Marital status: Married    Spouse name: [name]  . Number of children: 3  . Years of education: masters   Occupational History  . retired     [employer]   Social History Main Topics  . Smoking status: Never Smoker  . Smokeless tobacco: Never Used  . Alcohol use None     Comment: Rare  . Drug use: No  . Sexual activity: No     Other Topics Concern  . None   Social History Narrative   Pt lives with spouse.    Caffeine Use: 1-2 glasses per week.     Family History:  The patient's family history includes Alcoholism in her father; CVA in her other; Cancer in her father, maternal aunt, and maternal grandmother; Diabetes in her mother; Heart disease in her other; High Cholesterol in her mother; Hyperlipidemia in her other; Hypertension in her mother and other; Memory loss in her mother.   ROS:   Please see the history of present illness.    Occasional late afternoon lower extremity swelling. She has fleeting focal episodes of chest discomfort that are nonexertional.  All other systems reviewed and are negative.   PHYSICAL EXAM:   VS:  BP 126/84 (BP Location: Left Arm)   Pulse (!) 53   Ht 5\' 5"  (1.651 m)   Wt 141 lb 12.8 oz (64.3 kg)   BMI 23.60 kg/m    GEN: Well nourished, well developed, in no acute distress  HEENT: normal  Neck: no JVD, carotid bruits, or masses Cardiac: RRR; no murmurs, rubs, or gallops,no edema  Respiratory:  clear to auscultation bilaterally, normal work of breathing GI: soft, nontender, nondistended, + BS MS: no deformity or atrophy  Skin: warm and dry, no rash Neuro:  Alert and Oriented x 3, Strength and sensation are intact Psych: euthymic mood, full affect  Wt Readings from Last 3 Encounters:  06/12/16 141 lb 12.8 oz (64.3 kg)  07/13/14 140 lb (63.5 kg)  07/05/14 141 lb (64 kg)      Studies/Labs Reviewed:   EKG:  EKG When last evaluated in June 2017 demonstrated sinus bradycardia with precordial T-wave inversion.  Recent Labs: 09/05/2015: BUN 19; Creatinine, Ser 0.96; Hemoglobin 12.2; Platelets 298; Potassium 4.6; Sodium 137   Lipid Panel    Component Value Date/Time   CHOL 181 12/26/2013 0824   TRIG 224.0 (H) 12/26/2013 0824   HDL 46.00 12/26/2013 0824   CHOLHDL 4 12/26/2013 0824   VLDL 44.8 (H) 12/26/2013 0824   LDLCALC 68 05/09/2013 0940   LDLDIRECT 106.6  12/26/2013 0824    Additional studies/ records that were reviewed today include:  No new or recent cardiac studies. Followed in the Coumadin clinic with therapeutic INR.    ASSESSMENT:    1. Essential hypertension   2. Paroxysmal atrial fibrillation (HCC)   3. Encounter for therapeutic drug monitoring      PLAN:  In order of problems listed above:  1. Excellent blood pressure control today. No specific antihypertensive therapy is present. She has had wildly fluctuating blood pressures  in the past related to anxiety. 2. Brief episodes of paroxysmal atrial fibrillation discovered on Holter monitor 2008. No prolonged symptomatic episodes. In conjunction with a prior TIA, she has been maintained on Coumadin anticoagulation. She would need clinical follow-up in one year. 3. Coumadin therapy without complications.    Medication Adjustments/Labs and Tests Ordered: Current medicines are reviewed at length with the patient today.  Concerns regarding medicines are outlined above.  Medication changes, Labs and Tests ordered today are listed in the Patient Instructions below. Patient Instructions  Medication Instructions:  None  Labwork: None  Testing/Procedures: None  Follow-Up: Your physician wants you to follow-up in: 1 year with Dr. Tamala Julian.  You will receive a reminder letter in the mail two months in advance. If you don't receive a letter, please call our office to schedule the follow-up appointment.   Any Other Special Instructions Will Be Listed Below (If Applicable).     If you need a refill on your cardiac medications before your next appointment, please call your pharmacy.      Signed, Sinclair Grooms, MD  06/12/2016 [facility] [address], [place], [place]  [address] Phone: [phone]; Fax: [phone]

## 2016-06-12 NOTE — Patient Instructions (Signed)

## 2016-07-11 ENCOUNTER — Ambulatory Visit (INDEPENDENT_AMBULATORY_CARE_PROVIDER_SITE_OTHER): Payer: Medicare Other | Admitting: Pharmacist

## 2016-07-11 DIAGNOSIS — I480 Paroxysmal atrial fibrillation: Secondary | ICD-10-CM

## 2016-07-11 DIAGNOSIS — Z5181 Encounter for therapeutic drug level monitoring: Secondary | ICD-10-CM | POA: Diagnosis not present

## 2016-07-11 LAB — POCT INR: INR: 2.7

## 2016-07-24 DIAGNOSIS — S81852A Open bite, left lower leg, initial encounter: Secondary | ICD-10-CM | POA: Diagnosis not present

## 2016-07-24 DIAGNOSIS — W5501XA Bitten by cat, initial encounter: Secondary | ICD-10-CM | POA: Diagnosis not present

## 2016-07-25 ENCOUNTER — Telehealth: Payer: Self-pay | Admitting: Interventional Cardiology

## 2016-07-25 ENCOUNTER — Telehealth: Payer: Self-pay | Admitting: *Deleted

## 2016-07-25 NOTE — Telephone Encounter (Signed)
Pt called & reported she has started taking doxycycline 100mg  bid for 10 days on 07/24/16. Advised to come in to have it checked on Monday due to the interaction of Coumadin & Doxycycline. She stated she would be at the [facility] & return on Tuesday, advised importance of having INR checked. Advised that if she can have it checked at an Urgent Care if she can't make it in but call us. She did not know if she could have it checked at the [facility] but would call if she could. Advised to call early than later so we can get orders sent but if not she will come to the office to have INR checked on 07/29/16 once she is back from the [facility]. Advised of the risks of bleeding & she verbalized understanding. Educated on risks of injury & to report to ER & seek medical attention if any & she verbalized understanding.She will eat more dark green leafy veggies until she is able to come in. Also, she reported she missed a dose on last week 07/17/16 but on Friday she took 2 tabs instead of 1 tab since she missed 2 tabs Thursday.

## 2016-07-25 NOTE — Telephone Encounter (Signed)
Please see 1st encounter from [name] at 1239pm today

## 2016-07-25 NOTE — Telephone Encounter (Signed)
New message      Pt request to talk to the coumadin nurse.  She would not tell me what she wanted

## 2016-07-25 NOTE — Telephone Encounter (Signed)
Returned a call to the pt ask if she should switch to another antibiotic. Advised pt that people on Coumadin do take doxycycline and we adjust accordingly. Advised that if she decides to stop the antibiotic to call us to report & she verbalized understanding.

## 2016-07-25 NOTE — Telephone Encounter (Signed)
New message     Pt states she spoke w Candace about the antibiotic she is on , needs to speak to her again

## 2016-07-29 ENCOUNTER — Ambulatory Visit (INDEPENDENT_AMBULATORY_CARE_PROVIDER_SITE_OTHER): Payer: Medicare Other

## 2016-07-29 DIAGNOSIS — Z5181 Encounter for therapeutic drug level monitoring: Secondary | ICD-10-CM

## 2016-07-29 DIAGNOSIS — I480 Paroxysmal atrial fibrillation: Secondary | ICD-10-CM

## 2016-07-29 LAB — POCT INR: INR: 2.3

## 2016-07-30 DIAGNOSIS — L570 Actinic keratosis: Secondary | ICD-10-CM | POA: Diagnosis not present

## 2016-08-05 ENCOUNTER — Other Ambulatory Visit: Payer: Self-pay | Admitting: Interventional Cardiology

## 2016-08-29 ENCOUNTER — Other Ambulatory Visit: Payer: Self-pay | Admitting: Family Medicine

## 2016-08-29 DIAGNOSIS — Z1231 Encounter for screening mammogram for malignant neoplasm of breast: Secondary | ICD-10-CM

## 2016-09-09 DIAGNOSIS — G629 Polyneuropathy, unspecified: Secondary | ICD-10-CM | POA: Diagnosis not present

## 2016-09-09 DIAGNOSIS — I872 Venous insufficiency (chronic) (peripheral): Secondary | ICD-10-CM | POA: Diagnosis not present

## 2016-09-09 DIAGNOSIS — F419 Anxiety disorder, unspecified: Secondary | ICD-10-CM | POA: Diagnosis not present

## 2016-09-09 DIAGNOSIS — B351 Tinea unguium: Secondary | ICD-10-CM | POA: Diagnosis not present

## 2016-09-10 ENCOUNTER — Ambulatory Visit (INDEPENDENT_AMBULATORY_CARE_PROVIDER_SITE_OTHER): Payer: Medicare Other

## 2016-09-10 DIAGNOSIS — I480 Paroxysmal atrial fibrillation: Secondary | ICD-10-CM

## 2016-09-10 DIAGNOSIS — Z5181 Encounter for therapeutic drug level monitoring: Secondary | ICD-10-CM | POA: Diagnosis not present

## 2016-09-10 LAB — POCT INR: INR: 2.2

## 2016-09-15 ENCOUNTER — Ambulatory Visit: Payer: Medicare Other

## 2016-09-18 ENCOUNTER — Ambulatory Visit
Admission: RE | Admit: 2016-09-18 | Discharge: 2016-09-18 | Disposition: A | Discharge status: Home / Self Care | Payer: Medicare Other | Source: Ambulatory Visit | Attending: Family Medicine | Admitting: Family Medicine

## 2016-09-18 DIAGNOSIS — Z1231 Encounter for screening mammogram for malignant neoplasm of breast: Secondary | ICD-10-CM

## 2016-10-22 ENCOUNTER — Ambulatory Visit (INDEPENDENT_AMBULATORY_CARE_PROVIDER_SITE_OTHER): Payer: Medicare Other | Admitting: *Deleted

## 2016-10-22 DIAGNOSIS — Z5181 Encounter for therapeutic drug level monitoring: Secondary | ICD-10-CM | POA: Diagnosis not present

## 2016-10-22 DIAGNOSIS — I480 Paroxysmal atrial fibrillation: Secondary | ICD-10-CM

## 2016-10-22 LAB — POCT INR: INR: 2.5

## 2016-11-13 DIAGNOSIS — L814 Other melanin hyperpigmentation: Secondary | ICD-10-CM | POA: Diagnosis not present

## 2016-11-13 DIAGNOSIS — D225 Melanocytic nevi of trunk: Secondary | ICD-10-CM | POA: Diagnosis not present

## 2016-11-13 DIAGNOSIS — L739 Follicular disorder, unspecified: Secondary | ICD-10-CM | POA: Diagnosis not present

## 2016-11-13 DIAGNOSIS — L821 Other seborrheic keratosis: Secondary | ICD-10-CM | POA: Diagnosis not present

## 2016-11-17 DIAGNOSIS — E039 Hypothyroidism, unspecified: Secondary | ICD-10-CM | POA: Diagnosis not present

## 2016-11-21 ENCOUNTER — Emergency Department (HOSPITAL_COMMUNITY): Payer: Medicare Other

## 2016-11-21 ENCOUNTER — Encounter (HOSPITAL_COMMUNITY): Payer: Self-pay | Admitting: Emergency Medicine

## 2016-11-21 ENCOUNTER — Emergency Department (HOSPITAL_COMMUNITY)
Admission: EM | Admit: 2016-11-21 | Discharge: 2016-11-21 | Disposition: A | Discharge status: Home / Self Care | Payer: Medicare Other | Attending: Emergency Medicine | Admitting: Emergency Medicine

## 2016-11-21 DIAGNOSIS — R002 Palpitations: Secondary | ICD-10-CM | POA: Diagnosis not present

## 2016-11-21 DIAGNOSIS — I4891 Unspecified atrial fibrillation: Secondary | ICD-10-CM | POA: Diagnosis not present

## 2016-11-21 DIAGNOSIS — Z79899 Other long term (current) drug therapy: Secondary | ICD-10-CM | POA: Insufficient documentation

## 2016-11-21 DIAGNOSIS — Z8673 Personal history of transient ischemic attack (TIA), and cerebral infarction without residual deficits: Secondary | ICD-10-CM | POA: Diagnosis not present

## 2016-11-21 DIAGNOSIS — I10 Essential (primary) hypertension: Secondary | ICD-10-CM | POA: Insufficient documentation

## 2016-11-21 DIAGNOSIS — Z91040 Latex allergy status: Secondary | ICD-10-CM | POA: Insufficient documentation

## 2016-11-21 DIAGNOSIS — Z7901 Long term (current) use of anticoagulants: Secondary | ICD-10-CM | POA: Diagnosis not present

## 2016-11-21 DIAGNOSIS — I700 Atherosclerosis of aorta: Secondary | ICD-10-CM | POA: Diagnosis not present

## 2016-11-21 LAB — COMPREHENSIVE METABOLIC PANEL
ALBUMIN: 4.2 g/dL (ref 3.5–5.0)
ALK PHOS: 63 U/L (ref 38–126)
ALT: 15 U/L (ref 14–54)
ANION GAP: 8 (ref 5–15)
AST: 25 U/L (ref 15–41)
BILIRUBIN TOTAL: 0.3 mg/dL (ref 0.3–1.2)
BUN: 22 mg/dL — ABNORMAL HIGH (ref 6–20)
CALCIUM: 9.7 mg/dL (ref 8.9–10.3)
CO2: 26 mmol/L (ref 22–32)
Chloride: 104 mmol/L (ref 101–111)
Creatinine, Ser: 1.35 mg/dL — ABNORMAL HIGH (ref 0.44–1.00)
GFR calc Af Amer: 45 mL/min — ABNORMAL LOW (ref >60)
GFR, EST NON AFRICAN AMERICAN: 38 mL/min — AB (ref >60)
GLUCOSE: 152 mg/dL — AB (ref 65–99)
POTASSIUM: 4.1 mmol/L (ref 3.5–5.1)
Sodium: 138 mmol/L (ref 135–145)
TOTAL PROTEIN: 7 g/dL (ref 6.5–8.1)

## 2016-11-21 LAB — CBC WITH DIFFERENTIAL/PLATELET
Basophils Absolute: 0 10*3/uL (ref 0.0–0.1)
Basophils Relative: 0 %
Eosinophils Absolute: 0.1 10*3/uL (ref 0.0–0.7)
Eosinophils Relative: 2 %
HEMATOCRIT: 40.2 % (ref 36.0–46.0)
HEMOGLOBIN: 13.1 g/dL (ref 12.0–15.0)
LYMPHS ABS: 2.3 10*3/uL (ref 0.7–4.0)
LYMPHS PCT: 29 %
MCH: 26.3 pg (ref 26.0–34.0)
MCHC: 32.6 g/dL (ref 30.0–36.0)
MCV: 80.7 fL (ref 78.0–100.0)
MONO ABS: 0.6 10*3/uL (ref 0.1–1.0)
MONOS PCT: 7 %
NEUTROS ABS: 4.9 10*3/uL (ref 1.7–7.7)
NEUTROS PCT: 62 %
Platelets: 236 10*3/uL (ref 150–400)
RBC: 4.98 MIL/uL (ref 3.87–5.11)
RDW: 14.8 % (ref 11.5–15.5)
WBC: 8 10*3/uL (ref 4.0–10.5)

## 2016-11-21 LAB — PROTIME-INR
INR: 2.09
PROTHROMBIN TIME: 23.8 s — AB (ref 11.4–15.2)

## 2016-11-21 MED ORDER — DILTIAZEM HCL ER 120 MG PO CP12: 120.0000 mg | ORAL_CAPSULE | Freq: Two times a day (BID) | ORAL | 0 refills | Status: DC

## 2016-11-21 MED ORDER — DILTIAZEM HCL-DEXTROSE 100-5 MG/100ML-% IV SOLN (PREMIX)
5.0000 mg/h | INTRAVENOUS | Status: AC
  Administered 2016-11-21: 5 mg/h via INTRAVENOUS
  Filled 2016-11-21: qty 100

## 2016-11-21 MED ORDER — DILTIAZEM HCL ER 60 MG PO CP12: 60.0000 mg | ORAL_CAPSULE | Freq: Two times a day (BID) | ORAL | 0 refills | Status: DC

## 2016-11-21 MED ORDER — DILTIAZEM HCL ER COATED BEADS 120 MG PO CP24
120.0000 mg | ORAL_CAPSULE | Freq: Once | ORAL | Status: AC
  Administered 2016-11-21: 120 mg via ORAL
  Filled 2016-11-21: qty 1

## 2016-11-21 MED ORDER — DILTIAZEM LOAD VIA INFUSION
15.0000 mg | Freq: Once | INTRAVENOUS | Status: AC
  Administered 2016-11-21: 15 mg via INTRAVENOUS
  Filled 2016-11-21: qty 15

## 2016-11-21 MED ORDER — SODIUM CHLORIDE 0.9 % IV BOLUS (SEPSIS)
1000.0000 mL | Freq: Once | INTRAVENOUS | Status: AC
  Administered 2016-11-21: 1000 mL via INTRAVENOUS

## 2016-11-21 NOTE — ED Notes (Signed)
Pt is resting comfortably with family at bedside.   Denies any complaints at this time. 

## 2016-11-21 NOTE — ED Provider Notes (Signed)
[facility] DEPT Provider Note   CSN: [id number] Arrival date & time: 11/21/16  1650     History   Chief Complaint Chief Complaint  Patient presents with  . Atrial Fibrillation    HPI [name] is a 71 y.o. female.  The history is provided by the patient.  Atrial Fibrillation  This is a recurrent problem. The current episode started 1 to 2 hours ago. The problem occurs constantly. The problem has been gradually improving. Pertinent negatives include no chest pain and no shortness of breath. Nothing aggravates the symptoms. Nothing relieves the symptoms. She has tried nothing for the symptoms. Improvement on treatment: none tried.    Past Medical History:  Diagnosis Date  . Acoustic neuroma (HCC)    Hx of right ear acoustic neuroma, removed in 1999. No hearing in right ear.  [name] Anxiety    Hx of, responds to Wellbutrin  . Basal cell carcinoma    s/p MOHS surgery in 03/2011  . Cataract   . Deafness in right ear   . Headache(784.0)    MIGRAINES  . Hearing loss    Only in right ear, from an acoustic neuroma. S/P removal in 1999.   [name] History of neck pain    Responds to Flexeril  . History of shingles   . Hypercholesteremia   . Hypertension   . PAF (paroxysmal atrial fibrillation) ([place])    2 brief episodes of Afib recorded on monitor 02/2007  . PONV (postoperative nausea and vomiting)   . Sinus bradycardia   . TIA (transient ischemic attack) 2011   On coumadin. Asymptomatic, without recurrence.    Patient Active Problem List   Diagnosis Date Noted  . Hypersomnolence 07/05/2014  . Chronic cholecystitis with calculus 05/13/2013  . Encounter for therapeutic drug monitoring 05/09/2013  . Paroxysmal atrial fibrillation (HCC)   . Essential hypertension 12/05/2009  . HYPERCHOLESTEROLEMIA 10/23/2009  . ANXIETY STATE, UNSPECIFIED 10/23/2009  . MENOPAUSAL DISORDER 10/23/2009    Past Surgical History:  Procedure Laterality Date  . BREAST BIOPSY     X 3  .  BREAST CYST ASPIRATION Left 50 yrs ago per pt  . BREAST EXCISIONAL BIOPSY Left 50 yrs ago  . CHOLECYSTECTOMY N/A 05/26/2013   Procedure: LAPAROSCOPIC CHOLECYSTECTOMY WITH INTRAOPERATIVE CHOLANGIOGRAM;  Surgeon: Imogene Burn. Georgette Dover, MD;  Location: [facility];  Service: General;  Laterality: N/A;  . CRANIECTOMY FOR EXCISION OF ACOUSTIC NEUROMA Right 1999  . INTRAOCULAR LENS INSERTION     Dr. Talbert Forest  . MOHS SURGERY  03/2011   Basal cell skin cancer  . TONSILLECTOMY      OB History    No data available       Home Medications    Prior to Admission medications   Medication Sig Start Date End Date Taking? Authorizing Provider  acetaminophen (TYLENOL) 500 MG tablet Take 1,000 mg by mouth every 6 (six) hours as needed for moderate pain.   Yes [provider]  clonazePAM (KLONOPIN) 0.5 MG tablet Take 0.25-0.5 mg by mouth daily as needed for anxiety.  08/11/12  Yes [provider]  cyanocobalamin 1000 MCG tablet Take 100 mcg by mouth daily.   Yes [provider]  FLUoxetine (PROZAC) 10 MG capsule Take 10 mg by mouth daily. Take with 20mg  capsule to make 30 mg total 11/18/16  Yes [provider]  FLUoxetine (PROZAC) 20 MG capsule Take 20 mg by mouth daily. Take with 10mg  to make 30mg  total   Yes [provider]  fluticasone (FLONASE) 50 MCG/ACT nasal spray Place 2 sprays into the nose daily as needed for allergies.    Yes [provider]  levothyroxine (SYNTHROID, LEVOTHROID) 88 MCG tablet Take 88 mcg by mouth daily before breakfast.   Yes [provider]  loratadine (CLARITIN) 10 MG tablet Take 10 mg by mouth daily as needed for allergies.   Yes [provider]  Omega-3 Fatty Acids (FISH OIL TRIPLE STRENGTH) 1400 MG CAPS Take 1 capsule by mouth daily.   Yes [provider]  pantoprazole (PROTONIX) 40 MG tablet Take 40 mg by mouth daily as needed (acid reflux).    Yes [provider]  rosuvastatin (CRESTOR) 10 MG tablet  Take 10 mg by mouth 2 (two) times a week. 05/02/16  Yes [provider]  Vitamin D, Cholecalciferol, 400 UNITS CAPS Take 800 Units by mouth daily.   Yes [provider]  warfarin (COUMADIN) 2.5 MG tablet TAKE 1-2 TABLETS BY MOUTH DAILY AS DIRECTED BY COUMADIN CLINIC. Patient taking differently: Take 5mg  by mouth on Tuesday, Thursday, and Sunday. Take 2.5mg  by mouth on Monday, Wednesday, Friday, and Saturday. 08/05/16  Yes Belva Crome, MD  diltiazem (CARDIZEM SR) 60 MG 12 hr capsule Take 1 capsule (60 mg total) by mouth 2 (two) times daily. 11/21/16   Jesusmanuel Erbes, Corene Cornea, MD    Family History Family History  Problem Relation Age of Onset  . Cancer Father        liver  . Alcoholism Father   . Diabetes Mother   . High Cholesterol Mother   . Hypertension Mother   . Memory loss Mother   . Cancer Maternal Aunt        brain  . Breast cancer Maternal Aunt   . Cancer Maternal Grandmother        Unknown type  . Hypertension Other        paternal & maternal side with HTN, all lived into their 37s  . Heart disease Other        paternal & maternal side with heart disease, all lived into their 90s  . Hyperlipidemia Other        paternal & maternal side with hyperlipidemia, all lived into their 68s  . CVA Other        paternal & maternal side with strokes, all lived into their 34s    Social History Social History  Substance Use Topics  . Smoking status: Never Smoker  . Smokeless tobacco: Never Used  . Alcohol use Not on file     Comment: Rare     Allergies   Atorvastatin; Codeine; Latex; and Penicillins   Review of Systems Review of Systems  Respiratory: Negative for shortness of breath.   Cardiovascular: Negative for chest pain.  All other systems reviewed and are negative.    Physical Exam Updated Vital Signs BP 101/86   Pulse (!) 51   Temp 97.8 F (36.6 C) (Oral)   Resp 15   Ht 5\' 5"  (1.651 m)   Wt 62.6 kg (138 lb)   SpO2 95%   BMI 22.96 kg/m    Physical Exam  Constitutional: She is oriented to person, place, and time. She appears well-developed and well-nourished.  HENT:  Head: Normocephalic and atraumatic.  Eyes: Conjunctivae and EOM are normal.  Neck: Normal range of motion.  Cardiovascular: An irregularly irregular rhythm present. Tachycardia present.   No murmur heard. Pulmonary/Chest: Effort normal. No stridor. No respiratory distress. She has no wheezes. She has  no rales.  Abdominal: She exhibits no distension.  Neurological: She is alert and oriented to person, place, and time.  Skin: Skin is warm and dry. No erythema. No pallor.  Nursing note and vitals reviewed.    ED Treatments / Results  Labs (all labs ordered are listed, but only abnormal results are displayed) Labs Reviewed  COMPREHENSIVE METABOLIC PANEL - Abnormal; Notable for the following:       Result Value   Glucose, Bld 152 (*)    BUN 22 (*)    Creatinine, Ser 1.35 (*)    GFR calc non Af Amer 38 (*)    GFR calc Af Amer 45 (*)    All other components within normal limits  PROTIME-INR - Abnormal; Notable for the following:    Prothrombin Time 23.8 (*)    All other components within normal limits  CBC WITH DIFFERENTIAL/PLATELET    EKG  EKG Interpretation  Date/Time:  Friday November 21 2016 17:00:33 EDT Ventricular Rate:  134 PR Interval:    QRS Duration: 76 QT Interval:  304 QTC Calculation: 454 R Axis:   52 Text Interpretation:  Atrial flutter with varied AV block, Abnormal R-wave progression, early transition Borderline repol abnormality, diffuse leads Afib new since may 2017 Confirmed by Merrily Pew [phone]) on 11/21/2016 5:26:50 PM       Radiology Dg Chest Portable 1 View  Result Date: 11/21/2016 CLINICAL DATA:  Atrial fibrillation EXAM: PORTABLE CHEST 1 VIEW COMPARISON:  Chest radiograph Sep 05, 2015 and chest CT Sep 05, 2015 FINDINGS: There is bibasilar interstitial edema. No airspace consolidation or pleural effusion evident.  Heart is upper normal in size with pulmonary vascularity within normal limits. There is aortic atherosclerosis. No adenopathy. No evident bone lesions. IMPRESSION: Slight bibasilar interstitial edema. No airspace consolidation or pleural effusion. Heart upper normal in size. There is aortic atherosclerosis. Aortic Atherosclerosis (ICD10-I70.0). Electronically Signed   By: Lowella Grip III M.D.   On: 11/21/2016 17:38    Procedures Procedures (including critical care time)  CRITICAL CARE Performed by: Merrily Pew Total critical care time: 35 minutes Critical care time was exclusive of separately billable procedures and treating other patients. Critical care was necessary to treat or prevent imminent or life-threatening deterioration. Critical care was time spent personally by me on the following activities: development of treatment plan with patient and/or surrogate as well as nursing, discussions with consultants, evaluation of patient's response to treatment, examination of patient, obtaining history from patient or surrogate, ordering and performing treatments and interventions, ordering and review of laboratory studies, ordering and review of radiographic studies, pulse oximetry and re-evaluation of patient's condition.  Medications Ordered in ED Medications  diltiazem (CARDIZEM) 1 mg/mL load via infusion 15 mg (15 mg Intravenous Bolus from Bag 11/21/16 1737)    And  diltiazem (CARDIZEM) 100 mg in dextrose 5% 191mL (1 mg/mL) infusion (0 mg/hr Intravenous Stopped 11/21/16 2106)  sodium chloride 0.9 % bolus 1,000 mL (0 mLs Intravenous Stopped 11/21/16 1844)  diltiazem (CARDIZEM CD) 24 hr capsule 120 mg (120 mg Oral Given 11/21/16 2033)     Initial Impression / Assessment and Plan / ED Course  I have reviewed the triage vital signs and the nursing notes.  Pertinent labs & imaging results that were available during my care of the patient were reviewed by me and considered in my medical  decision making (see chart for details).  CHA2DS2/VAS Stroke Risk Points      3 >= 2 Points: High Risk  1 - 1.99 Points: Medium Risk  0 Points: Low Risk    The patient's score has not changed in the past year.:  No Change         Details    Note: External data might be a factor in metrics not marked with    Points Metrics   This score determines the patient's risk of having a stroke if the  patient has atrial fibrillation.       0 Has Congestive Heart Failure:  No   0 Has Vascular Disease:  No   1 Has Hypertension:  Yes   1 Age:  20   0 Has Diabetes:  No   0 Had Stroke:  No Had TIA:  No Had thromboembolism:  No   1 Female:  Yes           Afib. Patient wants to attempt medical therapy prior to electrical cardioversion.   Patient converted to sinus rhythm with rates in 50's (near her normal) and asymptomatic. Discussed with Dr. Sallyanne Kuster who agrees patient is stable for discharge. Already on coumadin and therapeutic, started Diltiazem as well, will fu w/ Dr. Tamala Julian, otherwise return here if new worsening symptoms or adverse effects from new medication.   Final Clinical Impressions(s) / ED Diagnoses   Final diagnoses:  Atrial fibrillation with RVR ([place])  Palpitations    New Prescriptions Discharge Medication List as of 11/21/2016  7:59 PM    START taking these medications   Details  diltiazem (CARDIZEM SR) 60 MG 12 hr capsule Take 1 capsule (60 mg total) by mouth 2 (two) times daily., Starting Fri 11/21/2016, Print         Shamara Soza, Corene Cornea, MD 11/22/16 1141

## 2016-11-21 NOTE — ED Triage Notes (Signed)
Pt states that she was cleaning her house today and about an hour ago, pt had taken a shower and sat down to crochet.  Pt states that she felt her heart pounding.  Pt is in a fib w/ RVR.  Ranges from 130-190.  Pt states that she has had some abd pain with diarrhea.

## 2016-11-24 ENCOUNTER — Ambulatory Visit (INDEPENDENT_AMBULATORY_CARE_PROVIDER_SITE_OTHER): Payer: Medicare Other | Admitting: Interventional Cardiology

## 2016-11-24 ENCOUNTER — Encounter (INDEPENDENT_AMBULATORY_CARE_PROVIDER_SITE_OTHER): Payer: Self-pay

## 2016-11-24 ENCOUNTER — Encounter: Payer: Self-pay | Admitting: Interventional Cardiology

## 2016-11-24 VITALS — BP 98/70 | HR 60 | Ht 65.0 in | Wt 140.0 lb

## 2016-11-24 DIAGNOSIS — I480 Paroxysmal atrial fibrillation: Secondary | ICD-10-CM

## 2016-11-24 DIAGNOSIS — I639 Cerebral infarction, unspecified: Secondary | ICD-10-CM | POA: Insufficient documentation

## 2016-11-24 DIAGNOSIS — I495 Sick sinus syndrome: Secondary | ICD-10-CM | POA: Diagnosis not present

## 2016-11-24 DIAGNOSIS — I63419 Cerebral infarction due to embolism of unspecified middle cerebral artery: Secondary | ICD-10-CM

## 2016-11-24 DIAGNOSIS — Z7901 Long term (current) use of anticoagulants: Secondary | ICD-10-CM | POA: Diagnosis not present

## 2016-11-24 HISTORY — DX: Long term (current) use of anticoagulants: Z79.01

## 2016-11-24 HISTORY — DX: Sick sinus syndrome: I49.5

## 2016-11-24 HISTORY — DX: Cerebral infarction, unspecified: I63.9

## 2016-11-24 NOTE — Progress Notes (Signed)
Cardiology Office Note    Date:  11/24/2016   ID:  [name], DOB 01/14/1946, MRN [id number]  PCP:  Aura Dials, MD  Cardiologist: Sinclair Grooms, MD   Chief Complaint  Patient presents with  . Atrial Fibrillation    History of Present Illness:  [name] is a 71 y.o. female female with history of Paroxysmal atrial fibrillation, essential hypertension,.  Exertional fatigue and recent visit to the [facility] emergency room during tachycardia. The incident EKG demonstrated a rate of 140 bpm. Close review of the EKG confirms atrial fibrillation with rapid response. In no ischemic changes were noted.  Over the past month to 6 weeks she has had exertional fatigue and dyspnea. She has noted occasional palpitations. No chest pain.  Past Medical History:  Diagnosis Date  . Acoustic neuroma (HCC)    Hx of right ear acoustic neuroma, removed in 1999. No hearing in right ear.  [name] Anxiety    Hx of, responds to Wellbutrin  . Basal cell carcinoma    s/p MOHS surgery in 03/2011  . Cataract   . Deafness in right ear   . Headache(784.0)    MIGRAINES  . Hearing loss    Only in right ear, from an acoustic neuroma. S/P removal in 1999.   [name] History of neck pain    Responds to Flexeril  . History of shingles   . Hypercholesteremia   . Hypertension   . PAF (paroxysmal atrial fibrillation) ([place])    2 brief episodes of Afib recorded on monitor 02/2007  . PONV (postoperative nausea and vomiting)   . Sinus bradycardia   . TIA (transient ischemic attack) 2011   On coumadin. Asymptomatic, without recurrence.    Past Surgical History:  Procedure Laterality Date  . BREAST BIOPSY     X 3  . BREAST CYST ASPIRATION Left 50 yrs ago per pt  . BREAST EXCISIONAL BIOPSY Left 50 yrs ago  . CHOLECYSTECTOMY N/A 05/26/2013   Procedure: LAPAROSCOPIC CHOLECYSTECTOMY WITH INTRAOPERATIVE CHOLANGIOGRAM;  Surgeon: Imogene Burn. Georgette Dover, MD;  Location: [facility];  Service: General;  Laterality:  N/A;  . CRANIECTOMY FOR EXCISION OF ACOUSTIC NEUROMA Right 1999  . INTRAOCULAR LENS INSERTION     Dr. Talbert Forest  . MOHS SURGERY  03/2011   Basal cell skin cancer  . TONSILLECTOMY      Current Medications: Outpatient Medications Prior to Visit  Medication Sig Dispense Refill  . acetaminophen (TYLENOL) 500 MG tablet Take 1,000 mg by mouth every 6 (six) hours as needed for moderate pain.    . clonazePAM (KLONOPIN) 0.5 MG tablet Take 0.25-0.5 mg by mouth daily as needed for anxiety.     . cyanocobalamin 1000 MCG tablet Take 100 mcg by mouth daily.    [name] FLUoxetine (PROZAC) 10 MG capsule Take 10 mg by mouth daily. Take with 20mg  capsule to make 30 mg total    . FLUoxetine (PROZAC) 20 MG capsule Take 20 mg by mouth daily. Take with 10mg  to make 30mg  total    . fluticasone (FLONASE) 50 MCG/ACT nasal spray Place 2 sprays into the nose daily as needed for allergies.     [name] levothyroxine (SYNTHROID, LEVOTHROID) 88 MCG tablet Take 88 mcg by mouth daily before breakfast.    . loratadine (CLARITIN) 10 MG tablet Take 10 mg by mouth daily as needed for allergies.    . Omega-3 Fatty Acids (FISH OIL TRIPLE STRENGTH) 1400 MG CAPS Take 1 capsule by mouth daily.    [name]  pantoprazole (PROTONIX) 40 MG tablet Take 40 mg by mouth daily as needed (acid reflux).     . rosuvastatin (CRESTOR) 10 MG tablet Take 10 mg by mouth 2 (two) times a week.    . Vitamin D, Cholecalciferol, 400 UNITS CAPS Take 800 Units by mouth daily.    [name] warfarin (COUMADIN) 2.5 MG tablet TAKE 1-2 TABLETS BY MOUTH DAILY AS DIRECTED BY COUMADIN CLINIC. (Patient taking differently: Take 5mg  by mouth on Tuesday, Thursday, and Sunday. Take 2.5mg  by mouth on Monday, Wednesday, Friday, and Saturday.) 45 tablet 3  . diltiazem (CARDIZEM SR) 60 MG 12 hr capsule Take 1 capsule (60 mg total) by mouth 2 (two) times daily. 60 capsule 0   No facility-administered medications prior to visit.      Allergies:   Atorvastatin; Codeine; Latex; and Penicillins    Social History   Social History  . Marital status: Married    Spouse name: [name]  . Number of children: 3  . Years of education: masters   Occupational History  . retired     [employer]   Social History Main Topics  . Smoking status: Never Smoker  . Smokeless tobacco: Never Used  . Alcohol use None     Comment: Rare  . Drug use: No  . Sexual activity: No   Other Topics Concern  . None   Social History Narrative   Pt lives with spouse.    Caffeine Use: 1-2 glasses per week.     Family History:  The patient's family history includes Alcoholism in her father; Breast cancer in her maternal aunt; CVA in her other; Cancer in her father, maternal aunt, and maternal grandmother; Diabetes in her mother; Heart disease in her other; High Cholesterol in her mother; Hyperlipidemia in her other; Hypertension in her mother and other; Memory loss in her mother.   ROS:   Please see the history of present illness.    Family history of tachybradycardia syndrome. A sister in her early 52s has a pacemaker and uses antiarrhythmic therapy for the syndrome. Her mother also had pacemaker and tachybradycardia syndrome. She has noted exertional fatigue and inability to do certain things issue year that she could easily do last year. She recently visited [place] and had difficulty walking in the cavernous. She has also been having some abdominal bloating. There is a history of COPD and she began worrying that it was progressing.   Prior history is positive for embolic CVA which led to continuous monitoring and identification of atrial fibrillation. Anticoagulation with Coumadin since that time. All other systems reviewed and are negative.   PHYSICAL EXAM:   VS:  BP 98/70 (BP Location: Left Arm)   Pulse 60   Ht 5\' 5"  (1.651 m)   Wt 140 lb (63.5 kg)   BMI 23.30 kg/m    GEN: Well nourished, well developed, in no acute distress  HEENT: normal  Neck: no JVD, carotid bruits, or  masses Cardiac: RRR; no murmurs, rubs, or gallops,no edema  Respiratory:  clear to auscultation bilaterally, normal work of breathing GI: soft, nontender, nondistended, + BS MS: no deformity or atrophy  Skin: warm and dry, no rash Neuro:  Alert and Oriented x 3, Strength and sensation are intact Psych: euthymic mood, full affect  Wt Readings from Last 3 Encounters:  11/24/16 140 lb (63.5 kg)  11/21/16 138 lb (62.6 kg)  06/12/16 141 lb 12.8 oz (64.3 kg)      Studies/Labs Reviewed:   EKG:  EKG  Performed on 11/21/16 revealed atrial fibrillation with rapid ventricular response. No ischemic changes were noted.  Recent Labs: 11/21/2016: ALT 15; BUN 22; Creatinine, Ser 1.35; Hemoglobin 13.1; Platelets 236; Potassium 4.1; Sodium 138   Lipid Panel    Component Value Date/Time   CHOL 181 12/26/2013 0824   TRIG 224.0 (H) 12/26/2013 0824   HDL 46.00 12/26/2013 0824   CHOLHDL 4 12/26/2013 0824   VLDL 44.8 (H) 12/26/2013 0824   LDLCALC 68 05/09/2013 0940   LDLDIRECT 106.6 12/26/2013 0824    Additional studies/ records that were reviewed today include:  No new data other than the EKG performed at [facility] during tachycardia.    ASSESSMENT:    1. Tachycardia-bradycardia syndrome ([place])   2. Paroxysmal atrial fibrillation ([place])   3. Chronic anticoagulation   4. Cerebrovascular accident (CVA) due to embolism of middle cerebral artery, unspecified blood vessel laterality (HCC)      PLAN:  In order of problems listed above:  1. Diltiazem was started in the emergency room. Her blood pressure is relatively low with systolic less than 841 since the medicine was started. She has had no recurrent palpitations since the emergency room visit. The EKG demonstrated atrial fib at 134 bpm. Off diltiazem, a 48 hour Holter monitor will be performed. 2. Paroxysmal A. fib that is a component of the patient's tachybradycardia syndrome. 2-D Doppler echocardiogram will be performed. Consider adding  flecainide if LV function is normal. Will discuss with EP. 3. Continue Coumadin therapy and discuss NOAC. 4. Continue Coumadin. No recurrent neurological complaints.  We will do a monitor for 48 hours with full activity to determine chronotropic competence and the presence of any recurrent arrhythmia. She clearly has a tendency towards tachycardia bradycardia. I am considering flecainide therapy if safe. May need to get EP involved. Return in 3-4 weeks after monitor completed  Medication Adjustments/Labs and Tests Ordered: Current medicines are reviewed at length with the patient today.  Concerns regarding medicines are outlined above.  Medication changes, Labs and Tests ordered today are listed in the Patient Instructions below. Patient Instructions  Medication Instructions:  1) DISCONTINUE Diltiazem  Labwork: None  Testing/Procedures: Your physician has requested that you have an echocardiogram. Echocardiography is a painless test that uses sound waves to create images of your heart. It provides your doctor with information about the size and shape of your heart and how well your heart's chambers and valves are working. This procedure takes approximately one hour. There are no restrictions for this procedure.  Your physician has recommended that you wear a 48 hour holter monitor sometime after this week. Holter monitors are medical devices that record the heart's electrical activity. Doctors most often use these monitors to diagnose arrhythmias. Arrhythmias are problems with the speed or rhythm of the heartbeat. The monitor is a small, portable device. You can wear one while you do your normal daily activities. This is usually used to diagnose what is causing palpitations/syncope (passing out).    Follow-Up: Your physician recommends that you schedule a follow-up appointment in: 1 month with Dr. Tamala Julian. (Take held slot on 9/20)   Any Other Special Instructions Will Be Listed Below (If  Applicable).     If you need a refill on your cardiac medications before your next appointment, please call your pharmacy.      Signed, Sinclair Grooms, MD  11/24/2016 10:06 AM    [facility] [place], [place], [place]  [address]  Phone: [phone]; Fax: [phone]

## 2016-11-24 NOTE — Patient Instructions (Addendum)
Medication Instructions:  1) DISCONTINUE Diltiazem  Labwork: None  Testing/Procedures: Your physician has requested that you have an echocardiogram. Echocardiography is a painless test that uses sound waves to create images of your heart. It provides your doctor with information about the size and shape of your heart and how well your heart's chambers and valves are working. This procedure takes approximately one hour. There are no restrictions for this procedure.  Your physician has recommended that you wear a 48 hour holter monitor sometime after this week. Holter monitors are medical devices that record the heart's electrical activity. Doctors most often use these monitors to diagnose arrhythmias. Arrhythmias are problems with the speed or rhythm of the heartbeat. The monitor is a small, portable device. You can wear one while you do your normal daily activities. This is usually used to diagnose what is causing palpitations/syncope (passing out).    Follow-Up: Your physician recommends that you schedule a follow-up appointment in: 1 month with Dr. Tamala Julian. (Take held slot on 9/20)   Any Other Special Instructions Will Be Listed Below (If Applicable).     If you need a refill on your cardiac medications before your next appointment, please call your pharmacy.

## 2016-12-02 ENCOUNTER — Ambulatory Visit (INDEPENDENT_AMBULATORY_CARE_PROVIDER_SITE_OTHER): Payer: Medicare Other | Admitting: *Deleted

## 2016-12-02 ENCOUNTER — Ambulatory Visit (INDEPENDENT_AMBULATORY_CARE_PROVIDER_SITE_OTHER): Payer: Medicare Other

## 2016-12-02 ENCOUNTER — Other Ambulatory Visit: Payer: Self-pay

## 2016-12-02 ENCOUNTER — Ambulatory Visit (HOSPITAL_COMMUNITY): Payer: Medicare Other | Attending: Cardiology

## 2016-12-02 DIAGNOSIS — I10 Essential (primary) hypertension: Secondary | ICD-10-CM | POA: Diagnosis not present

## 2016-12-02 DIAGNOSIS — I495 Sick sinus syndrome: Secondary | ICD-10-CM | POA: Diagnosis not present

## 2016-12-02 DIAGNOSIS — I340 Nonrheumatic mitral (valve) insufficiency: Secondary | ICD-10-CM | POA: Insufficient documentation

## 2016-12-02 DIAGNOSIS — I63419 Cerebral infarction due to embolism of unspecified middle cerebral artery: Secondary | ICD-10-CM | POA: Diagnosis not present

## 2016-12-02 DIAGNOSIS — Z8249 Family history of ischemic heart disease and other diseases of the circulatory system: Secondary | ICD-10-CM | POA: Diagnosis not present

## 2016-12-02 DIAGNOSIS — I480 Paroxysmal atrial fibrillation: Secondary | ICD-10-CM

## 2016-12-02 DIAGNOSIS — Z8673 Personal history of transient ischemic attack (TIA), and cerebral infarction without residual deficits: Secondary | ICD-10-CM | POA: Diagnosis not present

## 2016-12-02 DIAGNOSIS — I4891 Unspecified atrial fibrillation: Secondary | ICD-10-CM | POA: Diagnosis not present

## 2016-12-02 DIAGNOSIS — Z5181 Encounter for therapeutic drug level monitoring: Secondary | ICD-10-CM

## 2016-12-02 DIAGNOSIS — E785 Hyperlipidemia, unspecified: Secondary | ICD-10-CM | POA: Diagnosis not present

## 2016-12-02 LAB — POCT INR: INR: 3.6

## 2016-12-03 NOTE — Progress Notes (Signed)
lmtcb 12-03-16 (SJG)

## 2016-12-09 ENCOUNTER — Other Ambulatory Visit: Payer: Self-pay

## 2016-12-09 ENCOUNTER — Telehealth: Payer: Self-pay | Admitting: Interventional Cardiology

## 2016-12-09 ENCOUNTER — Encounter (HOSPITAL_COMMUNITY): Payer: Self-pay | Admitting: Emergency Medicine

## 2016-12-09 ENCOUNTER — Emergency Department (HOSPITAL_COMMUNITY)
Admission: EM | Admit: 2016-12-09 | Discharge: 2016-12-09 | Disposition: A | Discharge status: Home / Self Care | Payer: Medicare Other | Attending: Emergency Medicine | Admitting: Emergency Medicine

## 2016-12-09 DIAGNOSIS — Z7901 Long term (current) use of anticoagulants: Secondary | ICD-10-CM | POA: Diagnosis not present

## 2016-12-09 DIAGNOSIS — I4891 Unspecified atrial fibrillation: Secondary | ICD-10-CM

## 2016-12-09 DIAGNOSIS — Z85828 Personal history of other malignant neoplasm of skin: Secondary | ICD-10-CM | POA: Diagnosis not present

## 2016-12-09 DIAGNOSIS — I10 Essential (primary) hypertension: Secondary | ICD-10-CM | POA: Diagnosis not present

## 2016-12-09 DIAGNOSIS — I481 Persistent atrial fibrillation: Secondary | ICD-10-CM

## 2016-12-09 LAB — BASIC METABOLIC PANEL
ANION GAP: 8 (ref 5–15)
BUN: 17 mg/dL (ref 6–20)
CHLORIDE: 106 mmol/L (ref 101–111)
CO2: 24 mmol/L (ref 22–32)
Calcium: 9.7 mg/dL (ref 8.9–10.3)
Creatinine, Ser: 1.19 mg/dL — ABNORMAL HIGH (ref 0.44–1.00)
GFR calc Af Amer: 52 mL/min — ABNORMAL LOW (ref >60)
GFR calc non Af Amer: 45 mL/min — ABNORMAL LOW (ref >60)
Glucose, Bld: 124 mg/dL — ABNORMAL HIGH (ref 65–99)
POTASSIUM: 4.1 mmol/L (ref 3.5–5.1)
SODIUM: 138 mmol/L (ref 135–145)

## 2016-12-09 LAB — CBC WITH DIFFERENTIAL/PLATELET
BASOS ABS: 0 10*3/uL (ref 0.0–0.1)
Basophils Relative: 1 %
EOS ABS: 0.1 10*3/uL (ref 0.0–0.7)
Eosinophils Relative: 2 %
HCT: 37.8 % (ref 36.0–46.0)
HEMOGLOBIN: 12.6 g/dL (ref 12.0–15.0)
LYMPHS ABS: 2.4 10*3/uL (ref 0.7–4.0)
LYMPHS PCT: 37 %
MCH: 26.7 pg (ref 26.0–34.0)
MCHC: 33.3 g/dL (ref 30.0–36.0)
MCV: 80.1 fL (ref 78.0–100.0)
Monocytes Absolute: 0.4 10*3/uL (ref 0.1–1.0)
Monocytes Relative: 6 %
NEUTROS PCT: 54 %
Neutro Abs: 3.6 10*3/uL (ref 1.7–7.7)
Platelets: 266 10*3/uL (ref 150–400)
RBC: 4.72 MIL/uL (ref 3.87–5.11)
RDW: 14.9 % (ref 11.5–15.5)
WBC: 6.5 10*3/uL (ref 4.0–10.5)

## 2016-12-09 LAB — I-STAT TROPONIN, ED: TROPONIN I, POC: 0 ng/mL (ref 0.00–0.08)

## 2016-12-09 MED ORDER — FLECAINIDE ACETATE 100 MG PO TABS
300.0000 mg | ORAL_TABLET | Freq: Once | ORAL | Status: AC
  Administered 2016-12-09: 300 mg via ORAL
  Filled 2016-12-09: qty 3

## 2016-12-09 MED ORDER — METOPROLOL TARTRATE 5 MG/5ML IV SOLN
10.0000 mg | Freq: Once | INTRAVENOUS | Status: AC
  Administered 2016-12-09: 10 mg via INTRAVENOUS
  Filled 2016-12-09: qty 10

## 2016-12-09 NOTE — Telephone Encounter (Signed)
Left message to call back  

## 2016-12-09 NOTE — ED Provider Notes (Addendum)
[facility] DEPT Provider Note   CSN: [id number] Arrival date & time: 12/09/16  1850     History   Chief Complaint Chief Complaint  Patient presents with  . Atrial Fibrillation    HPI [name] is a 71 y.o. female.  71 yo F With a chief complaint of atrial fibrillation with RVR. Patient suddenly felt bad at home. Normally has a heart rate in the upper 40s.Felt a sudden rapid change. Has happened to her a couple weeks ago. Was given diltiazem with conversion and followed up as an outpatient. She feels very weak with this denies chest pain or shortness of breath. Denies recent illness. Has been eating and drinking well.   The history is provided by the patient.  Palpitations   This is a recurrent problem. The current episode started less than 1 hour ago. The problem occurs constantly. The problem has not changed since onset.On average, each episode lasts 1 hour. Pertinent negatives include no fever, no chest pain, no nausea, no vomiting, no headaches, no dizziness and no shortness of breath. She has tried nothing for the symptoms. The treatment provided no relief. Risk factors: hx of afib.    Past Medical History:  Diagnosis Date  . Acoustic neuroma (HCC)    Hx of right ear acoustic neuroma, removed in 1999. No hearing in right ear.  [name] Anxiety    Hx of, responds to Wellbutrin  . Basal cell carcinoma    s/p MOHS surgery in 03/2011  . Cataract   . Deafness in right ear   . Headache(784.0)    MIGRAINES  . Hearing loss    Only in right ear, from an acoustic neuroma. S/P removal in 1999.   [name] History of neck pain    Responds to Flexeril  . History of shingles   . Hypercholesteremia   . Hypertension   . PAF (paroxysmal atrial fibrillation) ([place])    2 brief episodes of Afib recorded on monitor 02/2007  . PONV (postoperative nausea and vomiting)   . Sinus bradycardia   . TIA (transient ischemic attack) 2011   On coumadin. Asymptomatic, without recurrence.     Patient Active Problem List   Diagnosis Date Noted  . Chronic anticoagulation 11/24/2016  . Tachycardia-bradycardia syndrome ([place]) 11/24/2016  . Embolic stroke ([place]) 01/60/1093  . Hypersomnolence 07/05/2014  . Chronic cholecystitis with calculus 05/13/2013  . Encounter for therapeutic drug monitoring 05/09/2013  . Paroxysmal atrial fibrillation (HCC)   . Essential hypertension 12/05/2009  . HYPERCHOLESTEROLEMIA 10/23/2009  . ANXIETY STATE, UNSPECIFIED 10/23/2009  . MENOPAUSAL DISORDER 10/23/2009    Past Surgical History:  Procedure Laterality Date  . BREAST BIOPSY     X 3  . BREAST CYST ASPIRATION Left 50 yrs ago per pt  . BREAST EXCISIONAL BIOPSY Left 50 yrs ago  . CHOLECYSTECTOMY N/A 05/26/2013   Procedure: LAPAROSCOPIC CHOLECYSTECTOMY WITH INTRAOPERATIVE CHOLANGIOGRAM;  Surgeon: Imogene Burn. Georgette Dover, MD;  Location: [facility];  Service: General;  Laterality: N/A;  . CRANIECTOMY FOR EXCISION OF ACOUSTIC NEUROMA Right 1999  . INTRAOCULAR LENS INSERTION     Dr. Talbert Forest  . MOHS SURGERY  03/2011   Basal cell skin cancer  . TONSILLECTOMY      OB History    No data available       Home Medications    Prior to Admission medications   Medication Sig Start Date End Date Taking? Authorizing Provider  clonazePAM (KLONOPIN) 0.5 MG tablet Take 0.25-0.5 mg by mouth daily as needed  for anxiety.  08/11/12  Yes [provider]  cyanocobalamin 1000 MCG tablet Take 100 mcg by mouth daily.   Yes [provider]  FLUoxetine (PROZAC) 10 MG capsule Take 10 mg by mouth daily. Take with 20mg  capsule to make 30 mg total 11/18/16  Yes [provider]  FLUoxetine (PROZAC) 20 MG capsule Take 20 mg by mouth daily. Take with 10mg  to make 30mg  total   Yes [provider]  fluticasone (FLONASE) 50 MCG/ACT nasal spray Place 2 sprays into the nose daily as needed for allergies.    Yes [provider]  levothyroxine (SYNTHROID, LEVOTHROID) 88 MCG tablet Take 88 mcg by  mouth daily before breakfast.   Yes [provider]  loratadine (CLARITIN) 10 MG tablet Take 10 mg by mouth daily as needed for allergies.   Yes [provider]  Omega-3 Fatty Acids (FISH OIL TRIPLE STRENGTH) 1400 MG CAPS Take 1 capsule by mouth daily.   Yes [provider]  pantoprazole (PROTONIX) 40 MG tablet Take 40 mg by mouth daily as needed (acid reflux).    Yes [provider]  rosuvastatin (CRESTOR) 10 MG tablet Take 10 mg by mouth 2 (two) times a week. 05/02/16  Yes [provider]  Vitamin D, Cholecalciferol, 400 UNITS CAPS Take 800 Units by mouth daily.   Yes [provider]  warfarin (COUMADIN) 2.5 MG tablet TAKE 1-2 TABLETS BY MOUTH DAILY AS DIRECTED BY COUMADIN CLINIC. Patient taking differently: Take 5mg  by mouth on Tuesday, Thursday, and Sunday. Take 2.5mg  by mouth on Monday, Wednesday, Friday, and Saturday. 08/05/16  Yes Belva Crome, MD  acetaminophen (TYLENOL) 500 MG tablet Take 1,000 mg by mouth every 6 (six) hours as needed for moderate pain.    [provider]    Family History Family History  Problem Relation Age of Onset  . Cancer Father        liver  . Alcoholism Father   . Diabetes Mother   . High Cholesterol Mother   . Hypertension Mother   . Memory loss Mother   . Cancer Maternal Aunt        brain  . Breast cancer Maternal Aunt   . Cancer Maternal Grandmother        Unknown type  . Hypertension Other        paternal & maternal side with HTN, all lived into their 22s  . Heart disease Other        paternal & maternal side with heart disease, all lived into their 38s  . Hyperlipidemia Other        paternal & maternal side with hyperlipidemia, all lived into their 76s  . CVA Other        paternal & maternal side with strokes, all lived into their 20s    Social History Social History  Substance Use Topics  . Smoking status: Never Smoker  . Smokeless tobacco: Never Used  . Alcohol use Not on  file     Comment: Rare     Allergies   Atorvastatin; Codeine; Latex; and Penicillins   Review of Systems Review of Systems  Constitutional: Negative for chills and fever.  HENT: Negative for congestion and rhinorrhea.   Eyes: Negative for redness and visual disturbance.  Respiratory: Negative for shortness of breath and wheezing.   Cardiovascular: Negative for chest pain and palpitations.  Gastrointestinal: Negative for nausea and vomiting.  Genitourinary: Negative for dysuria and urgency.  Musculoskeletal: Negative for arthralgias and  myalgias.  Skin: Negative for pallor and wound.  Neurological: Negative for dizziness and headaches.     Physical Exam Updated Vital Signs BP 115/70 (BP Location: Left Arm)   Pulse (!) 42   Temp 98.4 F (36.9 C) (Oral)   Resp 17   Ht 5\' 5"  (1.651 m)   Wt 63.5 kg (140 lb)   SpO2 97%   BMI 23.30 kg/m   Physical Exam  Constitutional: She is oriented to person, place, and time. She appears well-developed and well-nourished. No distress.  HENT:  Head: Normocephalic and atraumatic.  Eyes: Pupils are equal, round, and reactive to light. EOM are normal.  Neck: Normal range of motion. Neck supple.  Cardiovascular: An irregularly irregular rhythm present. Tachycardia present.  Exam reveals no gallop and no friction rub.   No murmur heard. Pulmonary/Chest: Effort normal. She has no wheezes. She has no rales.  Abdominal: Soft. She exhibits no distension. There is no tenderness.  Musculoskeletal: She exhibits no edema or tenderness.  Neurological: She is alert and oriented to person, place, and time.  Skin: Skin is warm and dry. She is not diaphoretic.  Psychiatric: She has a normal mood and affect. Her behavior is normal.  Nursing note and vitals reviewed.    ED Treatments / Results  Labs (all labs ordered are listed, but only abnormal results are displayed) Labs Reviewed  BASIC METABOLIC PANEL - Abnormal; Notable for the following:        Result Value   Glucose, Bld 124 (*)    Creatinine, Ser 1.19 (*)    GFR calc non Af Amer 45 (*)    GFR calc Af Amer 52 (*)    All other components within normal limits  CBC WITH DIFFERENTIAL/PLATELET  I-STAT TROPONIN, ED    EKG  EKG Interpretation None       Radiology No results found.  Procedures .Cardioversion Date/Time: 12/10/2016 12:34 PM Performed by: Deno Etienne Authorized by: Deno Etienne   Consent:    Consent obtained:  Verbal   Consent given by:  Patient   Risks discussed:  Induced arrhythmia   Alternatives discussed:  Rate-control medication and alternative treatment Pre-procedure details:    Cardioversion basis:  Emergent   Rhythm:  Atrial fibrillation   Electrode placement:  Anterior-posterior Attempt one:    Cardioversion mode attempt one: chemical. Post-procedure details:    Patient status:  Alert   Patient tolerance of procedure:  Tolerated well, no immediate complications Comments:     Converted chemically with flecainide.    (including critical care time)  Medications Ordered in ED Medications  metoprolol tartrate (LOPRESSOR) injection 10 mg (10 mg Intravenous Given 12/09/16 1937)  flecainide (TAMBOCOR) tablet 300 mg (300 mg Oral Given 12/09/16 2058)     Initial Impression / Assessment and Plan / ED Course  I have reviewed the triage vital signs and the nursing notes.  Pertinent labs & imaging results that were available during my care of the patient were reviewed by me and considered in my medical decision making (see chart for details).     71 yo F with a chief complaint of A. Fib with RVR. As the symptoms were rapid onset and she is on Coumadin I discussed cardioversion bedside. The patient is somewhat apprehensive and wished to talk with the cardiologist. In the meanwhile give her a dose of metoprolol. HR up into the 180's.  Give her IV fluids.  I discussed the case with cardiology. They discussed the case with the  patient over the phone.  Patient currently declining electrical cardioversion, she is requesting a trial of a medical cardioversion. He is recommending flecainide 300 mg. We'll place the patient on pads. Reassess.  Went back into NSR rate in the 40's.  Ambulated without difficulty.  Cards follow up.   CRITICAL CARE Performed by: Cecilio Asper   Total critical care time: 80 minutes  Critical care time was exclusive of separately billable procedures and treating other patients.  Critical care was necessary to treat or prevent imminent or life-threatening deterioration.  Critical care was time spent personally by me on the following activities: development of treatment plan with patient and/or surrogate as well as nursing, discussions with consultants, evaluation of patient's response to treatment, examination of patient, obtaining history from patient or surrogate, ordering and performing treatments and interventions, ordering and review of laboratory studies, ordering and review of radiographic studies, pulse oximetry and re-evaluation of patient's condition.  The patients results and plan were reviewed and discussed.   Any x-rays performed were independently reviewed by myself.   Differential diagnosis were considered with the presenting HPI.  Medications  metoprolol tartrate (LOPRESSOR) injection 10 mg (10 mg Intravenous Given 12/09/16 1937)  flecainide (TAMBOCOR) tablet 300 mg (300 mg Oral Given 12/09/16 2058)    Vitals:   12/09/16 2145 12/09/16 2200 12/09/16 2215 12/09/16 2230  BP: (!) 123/99 (!) 123/91 118/85 115/70  Pulse: 98 (!) 47 (!) 43 (!) 42  Resp: 15 (!) 21 (!) 30 17  Temp:      TempSrc:      SpO2: 97% 96% 97% 97%  Weight:      Height:        Final diagnoses:  Atrial fibrillation with RVR (HCC)    Admission/ observation were discussed with the admitting physician, patient and/or family and they are comfortable with the plan.   Final Clinical Impressions(s) / ED Diagnoses   Final  diagnoses:  Atrial fibrillation with RVR [facility])    New Prescriptions Discharge Medication List as of 12/09/2016 10:44 PM       Deno Etienne, DO 12/09/16 2304    Deno Etienne, DO 12/10/16 1235

## 2016-12-09 NOTE — ED Triage Notes (Signed)
Pt comes in with a recurrent episode of a-fib.  States she was here last week for same, wore a heart monitor for a week, and is back after experiencing palpitations today.  States she feels worse today than she did last week. A&O x4.

## 2016-12-09 NOTE — Telephone Encounter (Signed)
Spoke with pt and went over monitor results. Pt verbalized understanding and was appreciative for call.

## 2016-12-09 NOTE — Telephone Encounter (Signed)
New Message    Pt was returning call about AFIB events that showed up on the monitor please call

## 2016-12-09 NOTE — Progress Notes (Addendum)
Called by Dr. Tyrone Nine regarding [name] who is back in the [facility] with afib and RVR. Onset <24 hours and she is therapeutically anticoagulated on warfarin for >1 month. She is very symptomatic. Was treated with diltiazem recently and cardioverted back spontaneously. Saw Dr. Tamala Julian and echo shows normal LV function with mild to moderate biatrial enlargement. He had discussed flecainide therapy. I discussed options with her over the phone, including DCCV, high dose flecainide or rate-control strategy. She feels more comfortable with an attempt at chemical cardioversion. I would advise the 300 mg flecainide x 1 dose, provided her GFR is >35. BMP is pending, otherwise, reduce dose per pharmacy (probably 200 mg) - if she successfully converts than she could be discharged to follow-up with Dr. Tamala Julian. May need to be maintained on flecainide daily or use a pocket pill strategy. If she does not convert after 2 hours and is still symptomatic, consider DCCV. Feel free to call us with questions.  Pixie Casino, MD, [facility]  Attending Cardiologist  Direct Dial: [phone]  Fax: [phone]  Website:  [email or web address]

## 2016-12-09 NOTE — Discharge Instructions (Signed)
Return for worsening symptoms.  Follow up in the office.

## 2016-12-10 ENCOUNTER — Encounter (INDEPENDENT_AMBULATORY_CARE_PROVIDER_SITE_OTHER): Payer: Self-pay

## 2016-12-10 ENCOUNTER — Ambulatory Visit (INDEPENDENT_AMBULATORY_CARE_PROVIDER_SITE_OTHER): Payer: Medicare Other | Admitting: Physician Assistant

## 2016-12-10 ENCOUNTER — Encounter: Payer: Self-pay | Admitting: Physician Assistant

## 2016-12-10 VITALS — BP 110/60 | HR 44 | Ht 65.0 in | Wt 140.1 lb

## 2016-12-10 DIAGNOSIS — I495 Sick sinus syndrome: Secondary | ICD-10-CM

## 2016-12-10 DIAGNOSIS — I10 Essential (primary) hypertension: Secondary | ICD-10-CM

## 2016-12-10 DIAGNOSIS — I63419 Cerebral infarction due to embolism of unspecified middle cerebral artery: Secondary | ICD-10-CM

## 2016-12-10 DIAGNOSIS — I480 Paroxysmal atrial fibrillation: Secondary | ICD-10-CM | POA: Diagnosis not present

## 2016-12-10 MED ORDER — FLECAINIDE ACETATE 100 MG PO TABS: 200.0000 mg | ORAL_TABLET | ORAL | 11 refills | Status: DC | PRN

## 2016-12-10 MED ORDER — METOPROLOL SUCCINATE ER 25 MG PO TB24: 12.5000 mg | ORAL_TABLET | Freq: Every day | ORAL | 11 refills | Status: DC | PRN

## 2016-12-10 NOTE — Patient Instructions (Signed)
Medication Instructions:  1. AN RX FOR TOPROL XL 25 MG TABLET HAS BEEN SENT IN; THE DIRECTIONS ARE TO TAKE 1/2 TABLET = 12.5 MG FOR RECURRENT A-FIB  2. AN RX FOR FLECAINIDE 100 MG TABLET HAS BEEN SENT IN WITH THE DIRECTIONS TO READ:  IF 1 HOUR AFTER TAKING THE TOPROL AND YOU ARE STILL IN A-FIB YOU MAY TAKE FLECAINIDE 2 TABLETS = 200 MG  FOR RECURRENT A-FIB; IF YOU HAPPEN TO TAKE THE FLECAINIDE YOU CANNOT TAKE IT FOR ANOTHER 48 HOURS; IF YOU ARE STILL IN A-FIB AFTER TAKING THE FLECAINIDE YOU HAVE BEEN INSTRUCTED TO GO TO THE ED.   Labwork: NONE ORDERED TODAY  Testing/Procedures: Your physician has requested that you have en exercise stress myoview. For further information please visit [email or web address]. Please follow instruction sheet, as given. PER SCOTT WEAVER, PAC AND DR. ALLRED; IF STRESS TEST THIS TOMORROW Thursday 12/11/16 THEN WILL NEED TO SEE DR. ALLRED ON 12/12/16; IF STRESS TEST CANNOT BE DONE TOMORROW ON 9/6, SCHEDULE STRESS TEST AND PT WILL NEED TO SEE DR. ALLRED 12/22/16     Follow-Up: DR. Rayann Heman EITHER 12/12/16 OR 12/22/16  Any Other Special Instructions Will Be Listed Below (If Applicable).     If you need a refill on your cardiac medications before your next appointment, please call your pharmacy.

## 2016-12-10 NOTE — Progress Notes (Signed)
Cardiology Office Note:    Date:  12/10/2016   ID:  [name], DOB 25-Mar-1946, MRN [id number]  PCP:  Aura Dials, MD  Cardiologist:  Dr. Daneen Schick    Referring MD: Aura Dials, MD   Chief Complaint  Patient presents with  . Atrial Fibrillation    follow up after visit to ED last night    History of Present Illness:    [name] is a 71 y.o. female with a hx of Paroxysmal AF, HTN. Last seen by Dr. Tamala Julian 11/24/16. She had recently been to the emergency room with recurrent atrial fibrillation on 11/21/16. She converted to NSR on diltiazem. Follow-up Holter monitor demonstrated predominantly sinus rhythm with sinus bradycardia and evidence of tachycardia/bradycardia syndrome with episode of AF with RVR. At her last visit, Dr. Tamala Julian considered putting her on flecainide and for rhythm control. She was then seen in emergency room yesterday with recurrent, symptomatic atrial fibrillation with RVR. She was hasn't to proceed with electrocardioversion. Dr. Debara Pickett spoke with the patient over the telephone and she was given a dose of flecainide 300 mg in the ED with conversion to normal sinus rhythm.  [name] returns for follow-up. She is here with her daughter. She notes symptoms of fatigue as well as dyspnea with exertion for a couple of years now. Her heart rate has been low for quite some time. She denies chest pain. She denies syncope. She did feel near syncopal yesterday after receiving metoprolol IV in the emergency room. She denies orthopnea or PND or significant edema.  Prior CV studies:   The following studies were reviewed today:  48 hour Holter 12/02/16 Predominate NSR and SB Tachy-Brady syndrome with AF and RVR alternated with SR/SB.  Echo 12/02/16  EF 55-60, normal wall motion, grade 2 diastolic dysfunction, MAC, mild MR, mild to moderate LAE, normal RVSF, mild RAE, PASP 30  Carotid US 5/14 Negative for hemodynamically significant ICA stenosis  Past  Medical History:  Diagnosis Date  . Acoustic neuroma (HCC)    Hx of right ear acoustic neuroma, removed in 1999. No hearing in right ear.  [name] Anxiety    Hx of, responds to Wellbutrin  . Basal cell carcinoma    s/p MOHS surgery in 03/2011  . Cataract   . Deafness in right ear   . Headache(784.0)    MIGRAINES  . Hearing loss    Only in right ear, from an acoustic neuroma. S/P removal in 1999.   [name] History of neck pain    Responds to Flexeril  . History of shingles   . Hypercholesteremia   . Hypertension   . PAF (paroxysmal atrial fibrillation) ([place])    2 brief episodes of Afib recorded on monitor 02/2007  . PONV (postoperative nausea and vomiting)   . Sinus bradycardia   . TIA (transient ischemic attack) 2011   On coumadin. Asymptomatic, without recurrence.    Past Surgical History:  Procedure Laterality Date  . BREAST BIOPSY     X 3  . BREAST CYST ASPIRATION Left 50 yrs ago per pt  . BREAST EXCISIONAL BIOPSY Left 50 yrs ago  . CHOLECYSTECTOMY N/A 05/26/2013   Procedure: LAPAROSCOPIC CHOLECYSTECTOMY WITH INTRAOPERATIVE CHOLANGIOGRAM;  Surgeon: Imogene Burn. Georgette Dover, MD;  Location: [facility];  Service: General;  Laterality: N/A;  . CRANIECTOMY FOR EXCISION OF ACOUSTIC NEUROMA Right 1999  . INTRAOCULAR LENS INSERTION     Dr. Talbert Forest  . MOHS SURGERY  03/2011   Basal cell skin cancer  .  TONSILLECTOMY      Current Medications: Current Meds  Medication Sig  . acetaminophen (TYLENOL) 500 MG tablet Take 1,000 mg by mouth every 6 (six) hours as needed for moderate pain.  . clonazePAM (KLONOPIN) 0.5 MG tablet Take 0.25-0.5 mg by mouth daily as needed for anxiety.   . cyanocobalamin 1000 MCG tablet Take 100 mcg by mouth daily.  [name] FLUoxetine (PROZAC) 10 MG capsule Take 10 mg by mouth daily. Take with 41m capsule to make 30 mg total  . FLUoxetine (PROZAC) 20 MG capsule Take 20 mg by mouth daily. Take with 121mto make 3022motal  . fluticasone (FLONASE) 50 MCG/ACT nasal spray Place 2 sprays  into the nose daily as needed for allergies.   . lMarland Kitchenvothyroxine (SYNTHROID, LEVOTHROID) 88 MCG tablet Take 88 mcg by mouth daily before breakfast.  . loratadine (CLARITIN) 10 MG tablet Take 10 mg by mouth daily as needed for allergies.  . Omega-3 Fatty Acids (FISH OIL TRIPLE STRENGTH) 1400 MG CAPS Take 1 capsule by mouth daily.  . pantoprazole (PROTONIX) 40 MG tablet Take 40 mg by mouth daily as needed (acid reflux).   . rosuvastatin (CRESTOR) 10 MG tablet Take 10 mg by mouth 2 (two) times a week.  . Vitamin D, Cholecalciferol, 400 UNITS CAPS Take 800 Units by mouth daily.  . wMarland Kitchenrfarin (COUMADIN) 2.5 MG tablet TAKE 1-2 TABLETS BY MOUTH DAILY AS DIRECTED BY COUMADIN CLINIC.     Allergies:   Atorvastatin; Codeine; Latex; and Penicillins   Social History   Social History  . Marital status: Married    Spouse name: HarSharlet Salina Number of children: 3  . Years of education: masters   Occupational History  . retired     [employer] History Main Topics  . Smoking status: Never Smoker  . Smokeless tobacco: Never Used  . Alcohol use None     Comment: Rare  . Drug use: No  . Sexual activity: No   Other Topics Concern  . None   Social History Narrative   Pt lives with spouse.    Caffeine Use: 1-2 glasses per week.     Family Hx: The patient's family history includes Alcoholism in her father; Breast cancer in her maternal aunt; CVA in her other; Cancer in her father, maternal aunt, and maternal grandmother; Diabetes in her mother; Heart disease in her other; High Cholesterol in her mother; Hyperlipidemia in her other; Hypertension in her mother and other; Memory loss in her mother.  ROS:   Please see the history of present illness.    ROS All other systems reviewed and are negative.   EKGs/Labs/Other Test Reviewed:    EKG:  EKG is  ordered today.  The ekg ordered today demonstrates Sinus bradycardia, HR 43, normal axis, T-wave inversions V1 through V4, QTc 430 ms, no change  compared to prior tracing   Recent Labs: 11/21/2016: ALT 15 12/09/2016: BUN 17; Creatinine, Ser 1.19; Hemoglobin 12.6; Platelets 266; Potassium 4.1; Sodium 138   Recent Lipid Panel Lab Results  Component Value Date/Time   CHOL 181 12/26/2013 08:24 AM   TRIG 224.0 (H) 12/26/2013 08:24 AM   HDL 46.00 12/26/2013 08:24 AM   CHOLHDL 4 12/26/2013 08:24 AM   LDLCALC 68 05/09/2013 09:40 AM   LDLDIRECT 106.6 12/26/2013 08:24 AM    Physical Exam:    VS:  BP 110/60   Pulse (!) 44   Ht _0  (1.651 m)   Wt 140 lb 1.9  oz (63.6 kg)   SpO2 93%   BMI 23.32 kg/m     Wt Readings from Last 3 Encounters:  12/10/16 140 lb 1.9 oz (63.6 kg)  12/09/16 140 lb (63.5 kg)  11/24/16 140 lb (63.5 kg)     Physical Exam  Constitutional: She is oriented to person, place, and time. She appears well-developed and well-nourished. No distress.  HENT:  Head: Normocephalic and atraumatic.  Eyes: No scleral icterus.  Neck: No JVD present.  Cardiovascular: Normal rate and regular rhythm.   No murmur heard. Pulmonary/Chest: Effort normal. She has no rales.  Abdominal: Soft. There is no tenderness.  Musculoskeletal: She exhibits no edema.  Neurological: She is alert and oriented to person, place, and time.  Skin: Skin is warm and dry.  Psychiatric: She has a normal mood and affect.    ASSESSMENT:    1. Paroxysmal atrial fibrillation (HCC)   2. Essential hypertension   3. Tachycardia-bradycardia syndrome ([place])    PLAN:    In order of problems listed above:  1. Paroxysmal atrial fibrillation (HCC)  She has had recurrent atrial fibrillation with RVR that is symptomatic. She did convert on flecainide. She denies long-term anticoagulation with Coumadin. Recent INRs have been therapeutic.  CHADS2-VASc=4.  I reviewed her case today with Dr. Tamala Julian by phone. Initially, we considered placing her on daily flecainide to prevent recurrent atrial fibrillation. However, with her heart rate in the 26J, I'm not certain  she would tolerate even a low-dose beta blocker.  Therefore, I discussed her case with Dr. Rayann Heman (EP) who is in the office. If she has chronotropic incompetence, she would need a pacemaker to allow for rate controlling medications and antiarrhythmic therapy. However, if she is chronotropically competent, she may be a candidate for ablation. He has had some patients in the past develop faster heart rates after ablation for atrial fibrillation.  -  Obtain exercise Myoview  -  Refer to Dr. Rayann Heman for EP evaluation  2. Essential hypertension Controlled off of medical therapy.  3. Tachycardia-bradycardia syndrome ([place])  As noted, she has had evidence of tachycardia with atrial fibrillation and bradycardia while in sinus rhythm. Exercise Myoview will be obtained to assess for chronotropic incompetence. If this demonstrates chronotropic incompetence, she will likely need pacemaker implant.   Dispo:  Return for Refer to Dr. Rayann Heman for Atrial Fibrillation.   Medication Adjustments/Labs and Tests Ordered: Current medicines are reviewed at length with the patient today.  Concerns regarding medicines are outlined above.  Tests Ordered: Orders Placed This Encounter  Procedures  . Myocardial Perfusion Imaging  . EKG 12-Lead   Medication Changes: Meds ordered this encounter  Medications  . metoprolol succinate (TOPROL XL) 25 MG 24 hr tablet    Sig: Take 0.5 tablets (12.5 mg total) by mouth daily as needed. For recurrent A-fib    Dispense:  30 tablet    Refill:  11  . flecainide (TAMBOCOR) 100 MG tablet    Sig: Take 2 tablets (200 mg total) by mouth as needed. Take 2 tablets as needed 1 hour after you have taken Toprol if still in A-fib    Dispense:  30 tablet    Refill:  [address] Richardson Dopp, PA-C  12/10/2016 5:21 PM    [facility] [place], [place], [place]  [address] Phone: [phone]; Fax: [phone]

## 2016-12-11 ENCOUNTER — Encounter: Payer: Self-pay | Admitting: Physician Assistant

## 2016-12-11 ENCOUNTER — Ambulatory Visit (HOSPITAL_COMMUNITY): Payer: Medicare Other | Attending: Cardiovascular Disease

## 2016-12-11 DIAGNOSIS — I10 Essential (primary) hypertension: Secondary | ICD-10-CM | POA: Diagnosis not present

## 2016-12-11 DIAGNOSIS — I519 Heart disease, unspecified: Secondary | ICD-10-CM | POA: Insufficient documentation

## 2016-12-11 DIAGNOSIS — I480 Paroxysmal atrial fibrillation: Secondary | ICD-10-CM

## 2016-12-11 LAB — MYOCARDIAL PERFUSION IMAGING
LHR: 0.27
LV dias vol: 105 mL (ref 46–106)
LVSYSVOL: 40 mL
NUC STRESS TID: 1.1
Peak HR: 92 {beats}/min
Rest HR: 42 {beats}/min
SDS: 0
SRS: 0
SSS: 0

## 2016-12-11 MED ORDER — TECHNETIUM TC 99M TETROFOSMIN IV KIT
31.8000 | PACK | Freq: Once | INTRAVENOUS | Status: AC | PRN
  Administered 2016-12-11: 31.8 via INTRAVENOUS
  Filled 2016-12-11: qty 32

## 2016-12-11 MED ORDER — REGADENOSON 0.4 MG/5ML IV SOLN
0.4000 mg | Freq: Once | INTRAVENOUS | Status: AC
  Administered 2016-12-11: 0.4 mg via INTRAVENOUS

## 2016-12-11 MED ORDER — TECHNETIUM TC 99M TETROFOSMIN IV KIT
10.1000 | PACK | Freq: Once | INTRAVENOUS | Status: AC | PRN
  Administered 2016-12-11: 10.1 via INTRAVENOUS
  Filled 2016-12-11: qty 11

## 2016-12-12 ENCOUNTER — Ambulatory Visit (INDEPENDENT_AMBULATORY_CARE_PROVIDER_SITE_OTHER): Payer: Medicare Other | Admitting: *Deleted

## 2016-12-12 ENCOUNTER — Telehealth: Payer: Self-pay | Admitting: *Deleted

## 2016-12-12 ENCOUNTER — Ambulatory Visit (INDEPENDENT_AMBULATORY_CARE_PROVIDER_SITE_OTHER): Payer: Medicare Other | Admitting: Internal Medicine

## 2016-12-12 VITALS — BP 114/78 | HR 51 | Ht 65.0 in | Wt 142.0 lb

## 2016-12-12 DIAGNOSIS — I495 Sick sinus syndrome: Secondary | ICD-10-CM

## 2016-12-12 DIAGNOSIS — I480 Paroxysmal atrial fibrillation: Secondary | ICD-10-CM

## 2016-12-12 DIAGNOSIS — Z5181 Encounter for therapeutic drug level monitoring: Secondary | ICD-10-CM

## 2016-12-12 LAB — POCT INR: INR: 2.8

## 2016-12-12 NOTE — Progress Notes (Signed)
Electrophysiology Office Note   Date:  12/12/2016   ID:  [name], DOB [birth year], MRN [id number]  PCP:  Aura Dials, MD  Cardiologist:  Dr Tamala Julian Primary Electrophysiologist: Thompson Grayer, MD    CC: bradycardia   History of Present Illness: [name] is a 71 y.o. female who presents today for electrophysiology evaluation.   She is referred by Richardson Dopp and Dr Tamala Julian for further evaluation and management of bradycardia and atrial fibrillation.  She has had difficulty with fatigue and exertional shortness of breath.  Evaluation by Dr Tamala Julian has revealed sinus bradycardia.  She recently underwent stress testing.   During exercise, she developed SOB, fatigue, and lightheaded.  Her peak heart rate was 92 bpm and she was converted to a lexiscan study.  She reports having similar chronotropic incompetence at home. She has also had recent episodes of afib with RVR.  She has been evaluated however medical therapy has been limited by her bradycardia.   Today, she denies symptoms of palpitations, chest pain, shortness of breath, orthopnea, PND, lower extremity edema, claudication, dizziness, presyncope, syncope, bleeding, or neurologic sequela. The patient is tolerating medications without difficulties and is otherwise without complaint today.    Past Medical History:  Diagnosis Date  . Acoustic neuroma (HCC)    Hx of right ear acoustic neuroma, removed in 1999. No hearing in right ear.  [name] Anxiety    Hx of, responds to Wellbutrin  . Basal cell carcinoma    s/p MOHS surgery in 03/2011  . Cataract   . Deafness in right ear   . Headache(784.0)    MIGRAINES  . Hearing loss    Only in right ear, from an acoustic neuroma. S/P removal in 1999.   [name] History of neck pain    Responds to Flexeril  . History of nuclear stress test    ETT-Myoview 9/18: EF 62, no ischemia, good HR response to exercise, low risk  . History of shingles   . Hypercholesteremia   . Hypertension     . PAF (paroxysmal atrial fibrillation) ([place])    2 brief episodes of Afib recorded on monitor 02/2007  . PONV (postoperative nausea and vomiting)   . Sinus bradycardia   . TIA (transient ischemic attack) 2011   On coumadin. Asymptomatic, without recurrence.   Past Surgical History:  Procedure Laterality Date  . BREAST BIOPSY     X 3  . BREAST CYST ASPIRATION Left 50 yrs ago per pt  . BREAST EXCISIONAL BIOPSY Left 50 yrs ago  . CHOLECYSTECTOMY N/A 05/26/2013   Procedure: LAPAROSCOPIC CHOLECYSTECTOMY WITH INTRAOPERATIVE CHOLANGIOGRAM;  Surgeon: Imogene Burn. Georgette Dover, MD;  Location: [facility];  Service: General;  Laterality: N/A;  . CRANIECTOMY FOR EXCISION OF ACOUSTIC NEUROMA Right 1999  . INTRAOCULAR LENS INSERTION     Dr. Talbert Forest  . MOHS SURGERY  03/2011   Basal cell skin cancer  . TONSILLECTOMY       Current Outpatient Prescriptions  Medication Sig Dispense Refill  . acetaminophen (TYLENOL) 500 MG tablet Take 1,000 mg by mouth every 6 (six) hours as needed for moderate pain.    . clonazePAM (KLONOPIN) 0.5 MG tablet Take 0.25-0.5 mg by mouth daily as needed for anxiety.     . cyanocobalamin 1000 MCG tablet Take 100 mcg by mouth daily.    . flecainide (TAMBOCOR) 100 MG tablet Take 2 tablets (200 mg total) by mouth as needed. Take 2 tablets as needed 1 hour after you have taken  Toprol if still in A-fib 30 tablet 11  . FLUoxetine (PROZAC) 10 MG capsule Take 10 mg by mouth daily. Take with 20mg  capsule to make 30 mg total    . FLUoxetine (PROZAC) 20 MG capsule Take 20 mg by mouth daily. Take with 10mg  to make 30mg  total    . fluticasone (FLONASE) 50 MCG/ACT nasal spray Place 2 sprays into the nose daily as needed for allergies.     [name] levothyroxine (SYNTHROID, LEVOTHROID) 88 MCG tablet Take 88 mcg by mouth daily before breakfast.    . loratadine (CLARITIN) 10 MG tablet Take 10 mg by mouth daily as needed for allergies.    . metoprolol succinate (TOPROL XL) 25 MG 24 hr tablet Take 0.5 tablets (12.5  mg total) by mouth daily as needed. For recurrent A-fib 30 tablet 11  . Omega-3 Fatty Acids (FISH OIL TRIPLE STRENGTH) 1400 MG CAPS Take 1 capsule by mouth daily.    . pantoprazole (PROTONIX) 40 MG tablet Take 40 mg by mouth daily as needed (acid reflux).     . rosuvastatin (CRESTOR) 10 MG tablet Take 10 mg by mouth 2 (two) times a week.    . Vitamin D, Cholecalciferol, 400 UNITS CAPS Take 800 Units by mouth daily.    [name] warfarin (COUMADIN) 2.5 MG tablet TAKE 1-2 TABLETS BY MOUTH DAILY AS DIRECTED BY COUMADIN CLINIC. 45 tablet 3   No current facility-administered medications for this visit.     Allergies:   Atorvastatin; Codeine; Latex; and Penicillins   Social History:  The patient  reports that she has never smoked. She has never used smokeless tobacco. She reports that she does not use drugs.   Family History:  The patient's  family history includes Alcoholism in her father; Breast cancer in her maternal aunt; CVA in her other; Cancer in her father, maternal aunt, and maternal grandmother; Diabetes in her mother; Heart disease in her other; High Cholesterol in her mother; Hyperlipidemia in her other; Hypertension in her mother and other; Memory loss in her mother.    ROS:  Please see the history of present illness.   All other systems are personally reviewed and negative.    PHYSICAL EXAM: VS:  BP 114/78   Pulse (!) 51   Ht 5\' 5"  (1.651 m)   Wt 142 lb (64.4 kg)   BMI 23.63 kg/m  , BMI Body mass index is 23.63 kg/m. GEN: Well nourished, well developed, in no acute distress  HEENT: normal  Neck: no JVD, carotid bruits, or masses Cardiac: RRR; no murmurs, rubs, or gallops,no edema  Respiratory:  clear to auscultation bilaterally, normal work of breathing GI: soft, nontender, nondistended, + BS MS: no deformity or atrophy  Skin: warm and dry  Neuro:  Strength and sensation are intact Psych: euthymic mood, full affect  EKG:  EKG 12/10/16 reveals sinus bradycardia 43 bpm, incomplete  RBBB, diffuse twi   Recent Labs: 11/21/2016: ALT 15 12/09/2016: BUN 17; Creatinine, Ser 1.19; Hemoglobin 12.6; Platelets 266; Potassium 4.1; Sodium 138  personally reviewed   Lipid Panel     Component Value Date/Time   CHOL 181 12/26/2013 0824   TRIG 224.0 (H) 12/26/2013 0824   HDL 46.00 12/26/2013 0824   CHOLHDL 4 12/26/2013 0824   VLDL 44.8 (H) 12/26/2013 0824   LDLCALC 68 05/09/2013 0940   LDLDIRECT 106.6 12/26/2013 0824   personally reviewed   Wt Readings from Last 3 Encounters:  12/12/16 142 lb (64.4 kg)  12/10/16 140 lb 1.9 oz (  63.6 kg)  12/09/16 140 lb (63.5 kg)      Other studies personally reviewed: Additional studies/ records that were reviewed today include: Leta Speller notes, Dr Darliss Ridgel notes, recent myoview , echo Review of the above records today demonstrates: echo 12/02/16 reveals EF 55-60%, mild to moderate LA enlargement,  Mild MR   ASSESSMENT AND PLAN:  1.  Sick sinus syndrome The patient has symptomatic bradycardia.  Recent ETT revealed chronotropic incompetence.  The patient has symptomatic bradycardia.  No reversible causes are found.  I would therefore recommend pacemaker implantation at this time.  Risks, benefits, alternatives to pacemaker implantation were discussed in detail with the patient today. The patient understands that the risks include but are not limited to bleeding, infection, pneumothorax, perforation, tamponade, vascular damage, renal failure, MI, stroke, death, and lead dislodgement and wishes to proceed. We will therefore schedule the procedure at the next available time.  BluSync study discussed with the patient.  She is very interested.  2. Paroxysmal atrial fibrillation The patient has rare afib episodes.  Will reassess after PPM is implanted.  chads2vasc score is 5.  Continue anticoagulation long term.    Current medicines are reviewed at length with the patient today.   The patient does not have concerns regarding her medicines.   The following changes were made today:  none  Labs/ tests ordered today include:   Orders Placed This Encounter  Procedures  . Basic metabolic panel  . CBC with Differential     Signed, Thompson Grayer, MD  12/12/2016 4:28 PM     [address] [place] [address] [phone] (office) [phone] (fax)

## 2016-12-12 NOTE — Telephone Encounter (Signed)
-----   Message from Liliane Shi, [place] sent at 12/11/2016  9:22 PM EDT ----- Please call the patient. The stress test is ok.  Her heart rate did increase with exercise. There was no ischemia (loss of blood flow from a blockage).   Continue current medications and follow up as planned.  Please fax a copy of this study result to her PCP:  Aura Dials, MD  Thanks! Richardson Dopp, PA-C    12/11/2016 9:20 PM

## 2016-12-12 NOTE — Patient Instructions (Addendum)
Medication Instructions:  Your physician recommends that you continue on your current medications as directed. Please refer to the Current Medication list given to you today.   Labwork: None ordered    Testing/Procedures: Your physician has recommended that you have a pacemaker inserted. A pacemaker is a small device that is placed under the skin of your chest or abdomen to help control abnormal heart rhythms. This device uses electrical pulses to prompt the heart to beat at a normal rate. Pacemakers are used to treat heart rhythms that are too slow. Wire (leads) are attached to the pacemaker that goes into the chambers of you heart. This is done in the hospital and usually requires and overnight stay. Please see the instruction sheet given to you today for more information.---12/19/16  Please arrive at [facility] at 7:30am Do not eat or drink after midnight the night prior to the procedure Do not take any medications the morning of the test---HOLD Coumadin the pm before Plan for one night stay Will need someone to drive you home at discharge   Pacemaker Implantation, Adult Pacemaker implantation is a procedure to place a pacemaker inside your chest. A pacemaker is a small computer that sends electrical signals to the heart and helps your heart beat normally. A pacemaker also stores information about your heart rhythms. You may need pacemaker implantation if you:  Have a slow heartbeat (bradycardia).  Faint (syncope).  Have shortness of breath (dyspnea) due to heart problems.  The pacemaker attaches to your heart through a wire, called a lead. Sometimes just one lead is needed. Other times, there will be two leads. There are two types of pacemakers:  Transvenous pacemaker. This type is placed under the skin or muscle of your chest. The lead goes through a vein in the chest area to reach the inside of the heart.  Epicardial pacemaker. This type  is placed under the skin or muscle of your chest or belly. The lead goes through your chest to the outside of the heart.  Tell a health care provider about:  Any allergies you have.  All medicines you are taking, including vitamins, herbs, eye drops, creams, and over-the-counter medicines.  Any problems you or family members have had with anesthetic medicines.  Any blood or bone disorders you have.  Any surgeries you have had.  Any medical conditions you have.  Whether you are pregnant or may be pregnant. What are the risks? Generally, this is a safe procedure. However, problems may occur, including:  Infection.  Bleeding.  Failure of the pacemaker or the lead.  Collapse of a lung or bleeding into a lung.  Blood clot inside a blood vessel with a lead.  Damage to the heart.  Infection inside the heart (endocarditis).  Allergic reactions to medicines.  What happens before the procedure? Staying hydrated Follow instructions from your health care provider about hydration, which may include:  Up to 2 hours before the procedure - you may continue to drink clear liquids, such as water, clear fruit juice, black coffee, and plain tea.  Eating and drinking restrictions Follow instructions from your health care provider about eating and drinking, which may include:  8 hours before the procedure - stop eating heavy meals or foods such as meat, fried foods, or fatty foods.  6 hours before the procedure - stop eating light meals or foods, such as toast or cereal.  6 hours before the procedure - stop drinking milk  or drinks that contain milk.  2 hours before the procedure - stop drinking clear liquids.  Medicines  Ask your health care provider about: ? Changing or stopping your regular medicines. This is especially important if you are taking diabetes medicines or blood thinners. ? Taking medicines such as aspirin and ibuprofen. These medicines can thin your blood. Do not  take these medicines before your procedure if your health care provider instructs you not to.  You may be given antibiotic medicine to help prevent infection. General instructions  You will have a heart evaluation. This may include an electrocardiogram (ECG), chest X-ray, and heart imaging (echocardiogram,  or echo) tests.  You will have blood tests.  Do not use any products that contain nicotine or tobacco, such as cigarettes and e-cigarettes. If you need help quitting, ask your health care provider.  Plan to have someone take you home from the hospital or clinic.  If you will be going home right after the procedure, plan to have someone with you for 24 hours.  Ask your health care provider how your surgical site will be marked or identified. What happens during the procedure?  To reduce your risk of infection: ? Your health care team will wash or sanitize their hands. ? Your skin will be washed with soap. ? Hair may be removed from the surgical area.  An IV tube will be inserted into one of your veins.  You will be given one or more of the following: ? A medicine to help you relax (sedative). ? A medicine to numb the area (local anesthetic). ? A medicine to make you fall asleep (general anesthetic).  If you are getting a transvenous pacemaker: ? An incision will be made in your upper chest. ? A pocket will be made for the pacemaker. It may be placed under the skin or between layers of muscle. ? The lead will be inserted into a blood vessel that returns to the heart. ? While X-rays are taken by an imaging machine (fluoroscopy), the lead will be advanced through the vein to the inside of your heart. ? The other end of the lead will be tunneled under the skin and attached to the pacemaker.  If you are getting an epicardial pacemaker: ? An incision will be made near your ribs or breastbone (sternum) for the lead. ? The lead will be attached to the outside of your  heart. ? Another incision will be made in your chest or upper belly to create a pocket for the pacemaker. ? The free end of the lead will be tunneled under the skin and attached to the pacemaker.  The transvenous or epicardial pacemaker will be tested. Imaging studies may be done to check the lead position.  The incisions will be closed with stitches (sutures), adhesive strips, or skin glue.  Bandages (dressing) will be placed over the incisions. The procedure may vary among health care providers and hospitals. What happens after the procedure?  Your blood pressure, heart rate, breathing rate, and blood oxygen level will be monitored until the medicines you were given have worn off.  You will be given antibiotics and pain medicine.  ECG and chest x-rays will be done.  You will wear a continuous type of ECG (Holter monitor) to check your heart rhythm.  Your health care provider willprogram the pacemaker.  Do not drive for 24 hours if you received a sedative. This information is not intended to replace advice given to you by your health  care provider. Make sure you discuss any questions you have with your health care provider. Document Released: 03/14/2002 Document Revised: 10/12/2015 Document Reviewed: 09/05/2015 Elsevier Interactive Patient Education  2018 [place]: Your physician recommends that you schedule a follow-up appointment in: 10-14 days from 12/19/16 indevice clinic and 3 months from 12/19/16 wit Dr Rayann Heman  Thank you for choosing [facility]!!     Janan Halter, RN [phone]

## 2016-12-12 NOTE — Telephone Encounter (Signed)
Pt has been notified of Myoview results by phone with verbal understanding. Pt aware at this time to continue on her current Tx plan. Pt was sitting in our lobby when I called her with results. Pt has appt today with Dr. Rayann Heman. Pt asked was this appt to just go over the stress test results. I answered no that Dr. Rayann Heman appt was to discuss her A-fib w/RVR. Pt thanked me for my call.

## 2016-12-13 LAB — BASIC METABOLIC PANEL
BUN/Creatinine Ratio: 15 (ref 12–28)
BUN: 11 mg/dL (ref 8–27)
CO2: 24 mmol/L (ref 20–29)
Calcium: 9.6 mg/dL (ref 8.7–10.3)
Chloride: 103 mmol/L (ref 96–106)
Creatinine, Ser: 0.74 mg/dL (ref 0.57–1.00)
GFR, EST AFRICAN AMERICAN: 94 mL/min/{1.73_m2} (ref >59)
GFR, EST NON AFRICAN AMERICAN: 82 mL/min/{1.73_m2} (ref >59)
Glucose: 103 mg/dL — ABNORMAL HIGH (ref 65–99)
POTASSIUM: 5.1 mmol/L (ref 3.5–5.2)
SODIUM: 143 mmol/L (ref 134–144)

## 2016-12-13 LAB — CBC WITH DIFFERENTIAL/PLATELET
BASOS ABS: 0 10*3/uL (ref 0.0–0.2)
BASOS: 0 %
EOS (ABSOLUTE): 0.1 10*3/uL (ref 0.0–0.4)
Eos: 2 %
Hematocrit: 38.9 % (ref 34.0–46.6)
Hemoglobin: 12.3 g/dL (ref 11.1–15.9)
IMMATURE GRANS (ABS): 0 10*3/uL (ref 0.0–0.1)
IMMATURE GRANULOCYTES: 0 %
Lymphocytes Absolute: 2.5 10*3/uL (ref 0.7–3.1)
Lymphs: 36 %
MCH: 26.3 pg — ABNORMAL LOW (ref 26.6–33.0)
MCHC: 31.6 g/dL (ref 31.5–35.7)
MCV: 83 fL (ref 79–97)
MONOS ABS: 0.5 10*3/uL (ref 0.1–0.9)
Monocytes: 8 %
Neutrophils Absolute: 3.6 10*3/uL (ref 1.4–7.0)
Neutrophils: 54 %
Platelets: 285 10*3/uL (ref 150–379)
RBC: 4.67 x10E6/uL (ref 3.77–5.28)
RDW: 16.1 % — AB (ref 12.3–15.4)
WBC: 6.8 10*3/uL (ref 3.4–10.8)

## 2016-12-17 ENCOUNTER — Other Ambulatory Visit: Payer: Self-pay | Admitting: Interventional Cardiology

## 2016-12-19 ENCOUNTER — Ambulatory Visit (HOSPITAL_COMMUNITY)
Admission: RE | Disposition: A | Discharge status: Home / Self Care | Payer: Self-pay | Source: Ambulatory Visit | Attending: Internal Medicine

## 2016-12-19 ENCOUNTER — Ambulatory Visit (HOSPITAL_COMMUNITY): Payer: Medicare Other

## 2016-12-19 ENCOUNTER — Ambulatory Visit (HOSPITAL_COMMUNITY)
Admission: RE | Admit: 2016-12-19 | Discharge: 2016-12-19 | Disposition: A | Discharge status: Home / Self Care | Payer: Medicare Other | Source: Ambulatory Visit | Attending: Internal Medicine | Admitting: Internal Medicine

## 2016-12-19 ENCOUNTER — Encounter (HOSPITAL_COMMUNITY): Payer: Self-pay | Admitting: Internal Medicine

## 2016-12-19 DIAGNOSIS — F419 Anxiety disorder, unspecified: Secondary | ICD-10-CM | POA: Insufficient documentation

## 2016-12-19 DIAGNOSIS — I480 Paroxysmal atrial fibrillation: Secondary | ICD-10-CM | POA: Insufficient documentation

## 2016-12-19 DIAGNOSIS — I10 Essential (primary) hypertension: Secondary | ICD-10-CM | POA: Diagnosis not present

## 2016-12-19 DIAGNOSIS — Z7951 Long term (current) use of inhaled steroids: Secondary | ICD-10-CM | POA: Insufficient documentation

## 2016-12-19 DIAGNOSIS — E7800 Pure hypercholesterolemia, unspecified: Secondary | ICD-10-CM | POA: Insufficient documentation

## 2016-12-19 DIAGNOSIS — E785 Hyperlipidemia, unspecified: Secondary | ICD-10-CM | POA: Diagnosis not present

## 2016-12-19 DIAGNOSIS — Z8673 Personal history of transient ischemic attack (TIA), and cerebral infarction without residual deficits: Secondary | ICD-10-CM | POA: Insufficient documentation

## 2016-12-19 DIAGNOSIS — G43909 Migraine, unspecified, not intractable, without status migrainosus: Secondary | ICD-10-CM | POA: Insufficient documentation

## 2016-12-19 DIAGNOSIS — Z950 Presence of cardiac pacemaker: Secondary | ICD-10-CM | POA: Diagnosis not present

## 2016-12-19 DIAGNOSIS — Z7901 Long term (current) use of anticoagulants: Secondary | ICD-10-CM | POA: Insufficient documentation

## 2016-12-19 DIAGNOSIS — Z959 Presence of cardiac and vascular implant and graft, unspecified: Secondary | ICD-10-CM

## 2016-12-19 DIAGNOSIS — Z91040 Latex allergy status: Secondary | ICD-10-CM | POA: Diagnosis not present

## 2016-12-19 DIAGNOSIS — I495 Sick sinus syndrome: Secondary | ICD-10-CM | POA: Diagnosis not present

## 2016-12-19 DIAGNOSIS — Z880 Allergy status to penicillin: Secondary | ICD-10-CM | POA: Diagnosis not present

## 2016-12-19 HISTORY — DX: Sick sinus syndrome: I49.5

## 2016-12-19 HISTORY — PX: PACEMAKER IMPLANT: EP1218

## 2016-12-19 LAB — PROTIME-INR
INR: 2.07
Prothrombin Time: 23.1 seconds — ABNORMAL HIGH (ref 11.4–15.2)

## 2016-12-19 LAB — SURGICAL PCR SCREEN
MRSA, PCR: NEGATIVE
STAPHYLOCOCCUS AUREUS: NEGATIVE

## 2016-12-19 SURGERY — PACEMAKER IMPLANT
Anesthesia: LOCAL

## 2016-12-19 MED ORDER — CLONAZEPAM 0.5 MG PO TABS: 0.2500 mg | ORAL_TABLET | Freq: Every evening | ORAL | Status: DC

## 2016-12-19 MED ORDER — SODIUM CHLORIDE 0.9 % IV SOLN: 250.0000 mL | INTRAVENOUS | Status: DC | PRN

## 2016-12-19 MED ORDER — VANCOMYCIN HCL IN DEXTROSE 1-5 GM/200ML-% IV SOLN
1000.0000 mg | Freq: Two times a day (BID) | INTRAVENOUS | Status: DC
  Filled 2016-12-19: qty 200

## 2016-12-19 MED ORDER — LEVOTHYROXINE SODIUM 88 MCG PO TABS: 88.0000 ug | ORAL_TABLET | Freq: Every day | ORAL | Status: DC

## 2016-12-19 MED ORDER — LIDOCAINE HCL (PF) 1 % IJ SOLN
INTRAMUSCULAR | Status: AC
  Filled 2016-12-19: qty 60

## 2016-12-19 MED ORDER — WARFARIN SODIUM 2.5 MG PO TABS: 2.5000 mg | ORAL_TABLET | Freq: Once | ORAL | Status: DC

## 2016-12-19 MED ORDER — SODIUM CHLORIDE 0.9 % IV SOLN
INTRAVENOUS | Status: DC
  Administered 2016-12-19: 08:00:00 via INTRAVENOUS

## 2016-12-19 MED ORDER — LIDOCAINE HCL (PF) 1 % IJ SOLN
INTRAMUSCULAR | Status: DC | PRN
  Administered 2016-12-19: 45 mL

## 2016-12-19 MED ORDER — MUPIROCIN 2 % EX OINT
TOPICAL_OINTMENT | CUTANEOUS | Status: AC
  Administered 2016-12-19: 1
  Filled 2016-12-19: qty 22

## 2016-12-19 MED ORDER — HEPARIN (PORCINE) IN NACL 2-0.9 UNIT/ML-% IJ SOLN
INTRAMUSCULAR | Status: AC
  Filled 2016-12-19: qty 500

## 2016-12-19 MED ORDER — SODIUM CHLORIDE 0.9% FLUSH: 3.0000 mL | INTRAVENOUS | Status: DC | PRN

## 2016-12-19 MED ORDER — VANCOMYCIN HCL IN DEXTROSE 1-5 GM/200ML-% IV SOLN
INTRAVENOUS | Status: AC
  Filled 2016-12-19: qty 200

## 2016-12-19 MED ORDER — CHLORHEXIDINE GLUCONATE 4 % EX LIQD: 60.0000 mL | Freq: Once | CUTANEOUS | Status: DC

## 2016-12-19 MED ORDER — MUPIROCIN 2 % EX OINT
TOPICAL_OINTMENT | CUTANEOUS | Status: AC
  Filled 2016-12-19: qty 22

## 2016-12-19 MED ORDER — HEPARIN (PORCINE) IN NACL 2-0.9 UNIT/ML-% IJ SOLN
INTRAMUSCULAR | Status: AC | PRN
  Administered 2016-12-19: 500 mL

## 2016-12-19 MED ORDER — SODIUM CHLORIDE 0.9 % IR SOLN
80.0000 mg | Status: AC
  Administered 2016-12-19: 80 mg

## 2016-12-19 MED ORDER — VANCOMYCIN HCL IN DEXTROSE 1-5 GM/200ML-% IV SOLN
1000.0000 mg | INTRAVENOUS | Status: AC
  Administered 2016-12-19: 1000 mg via INTRAVENOUS

## 2016-12-19 MED ORDER — FLUOXETINE HCL 10 MG PO CAPS
10.0000 mg | ORAL_CAPSULE | Freq: Every day | ORAL | Status: DC
Start: 2016-12-20 — End: 2016-12-19

## 2016-12-19 MED ORDER — WARFARIN - PHYSICIAN DOSING INPATIENT: Freq: Every day | Status: DC

## 2016-12-19 MED ORDER — ACETAMINOPHEN 325 MG PO TABS: 325.0000 mg | ORAL_TABLET | ORAL | Status: DC | PRN

## 2016-12-19 MED ORDER — SODIUM CHLORIDE 0.9% FLUSH
3.0000 mL | Freq: Two times a day (BID) | INTRAVENOUS | Status: DC
  Administered 2016-12-19: 3 mL via INTRAVENOUS

## 2016-12-19 MED ORDER — ONDANSETRON HCL 4 MG/2ML IJ SOLN: 4.0000 mg | Freq: Four times a day (QID) | INTRAMUSCULAR | Status: DC | PRN

## 2016-12-19 MED ORDER — SODIUM CHLORIDE 0.9 % IR SOLN
Status: AC
  Filled 2016-12-19: qty 2

## 2016-12-19 SURGICAL SUPPLY — 8 items
CABLE SURGICAL S-101-97-12 (CABLE) ×1 IMPLANT
IPG PACE AZUR XT DR MRI W1DR01 (Pacemaker) IMPLANT
LEAD CAPSURE NOVUS 45CM (Lead) ×1 IMPLANT
LEAD CAPSURE NOVUS 5076-58CM (Lead) ×1 IMPLANT
PACE AZURE XT DR MRI W1DR01 (Pacemaker) ×2 IMPLANT
PAD DEFIB LIFELINK (PAD) ×1 IMPLANT
SHEATH CLASSIC 7F (SHEATH) ×2 IMPLANT
TRAY PACEMAKER INSERTION (PACKS) ×1 IMPLANT

## 2016-12-19 NOTE — H&P (View-Only) (Signed)
Electrophysiology Office Note   Date:  12/12/2016   ID:  [name], DOB 03/13/1946, MRN [id number]  PCP:  Aura Dials, MD  Cardiologist:  Dr Tamala Julian Primary Electrophysiologist: Thompson Grayer, MD    CC: bradycardia   History of Present Illness: [name] is a 72 y.o. female who presents today for electrophysiology evaluation.   She is referred by Richardson Dopp and Dr Tamala Julian for further evaluation and management of bradycardia and atrial fibrillation.  She has had difficulty with fatigue and exertional shortness of breath.  Evaluation by Dr Tamala Julian has revealed sinus bradycardia.  She recently underwent stress testing.   During exercise, she developed SOB, fatigue, and lightheaded.  Her peak heart rate was 92 bpm and she was converted to a lexiscan study.  She reports having similar chronotropic incompetence at home. She has also had recent episodes of afib with RVR.  She has been evaluated however medical therapy has been limited by her bradycardia.   Today, she denies symptoms of palpitations, chest pain, shortness of breath, orthopnea, PND, lower extremity edema, claudication, dizziness, presyncope, syncope, bleeding, or neurologic sequela. The patient is tolerating medications without difficulties and is otherwise without complaint today.    Past Medical History:  Diagnosis Date  . Acoustic neuroma (HCC)    Hx of right ear acoustic neuroma, removed in 1999. No hearing in right ear.  [name] Anxiety    Hx of, responds to Wellbutrin  . Basal cell carcinoma    s/p MOHS surgery in 03/2011  . Cataract   . Deafness in right ear   . Headache(784.0)    MIGRAINES  . Hearing loss    Only in right ear, from an acoustic neuroma. S/P removal in 1999.   [name] History of neck pain    Responds to Flexeril  . History of nuclear stress test    ETT-Myoview 9/18: EF 62, no ischemia, good HR response to exercise, low risk  . History of shingles   . Hypercholesteremia   . Hypertension     . PAF (paroxysmal atrial fibrillation) ([place])    2 brief episodes of Afib recorded on monitor 02/2007  . PONV (postoperative nausea and vomiting)   . Sinus bradycardia   . TIA (transient ischemic attack) 2011   On coumadin. Asymptomatic, without recurrence.   Past Surgical History:  Procedure Laterality Date  . BREAST BIOPSY     X 3  . BREAST CYST ASPIRATION Left 50 yrs ago per pt  . BREAST EXCISIONAL BIOPSY Left 50 yrs ago  . CHOLECYSTECTOMY N/A 05/26/2013   Procedure: LAPAROSCOPIC CHOLECYSTECTOMY WITH INTRAOPERATIVE CHOLANGIOGRAM;  Surgeon: Imogene Burn. Georgette Dover, MD;  Location: [facility];  Service: General;  Laterality: N/A;  . CRANIECTOMY FOR EXCISION OF ACOUSTIC NEUROMA Right 1999  . INTRAOCULAR LENS INSERTION     Dr. Talbert Forest  . MOHS SURGERY  03/2011   Basal cell skin cancer  . TONSILLECTOMY       Current Outpatient Prescriptions  Medication Sig Dispense Refill  . acetaminophen (TYLENOL) 500 MG tablet Take 1,000 mg by mouth every 6 (six) hours as needed for moderate pain.    . clonazePAM (KLONOPIN) 0.5 MG tablet Take 0.25-0.5 mg by mouth daily as needed for anxiety.     . cyanocobalamin 1000 MCG tablet Take 100 mcg by mouth daily.    . flecainide (TAMBOCOR) 100 MG tablet Take 2 tablets (200 mg total) by mouth as needed. Take 2 tablets as needed 1 hour after you have taken  Toprol if still in A-fib 30 tablet 11  . FLUoxetine (PROZAC) 10 MG capsule Take 10 mg by mouth daily. Take with 20mg  capsule to make 30 mg total    . FLUoxetine (PROZAC) 20 MG capsule Take 20 mg by mouth daily. Take with 10mg  to make 30mg  total    . fluticasone (FLONASE) 50 MCG/ACT nasal spray Place 2 sprays into the nose daily as needed for allergies.     [name] levothyroxine (SYNTHROID, LEVOTHROID) 88 MCG tablet Take 88 mcg by mouth daily before breakfast.    . loratadine (CLARITIN) 10 MG tablet Take 10 mg by mouth daily as needed for allergies.    . metoprolol succinate (TOPROL XL) 25 MG 24 hr tablet Take 0.5 tablets (12.5  mg total) by mouth daily as needed. For recurrent A-fib 30 tablet 11  . Omega-3 Fatty Acids (FISH OIL TRIPLE STRENGTH) 1400 MG CAPS Take 1 capsule by mouth daily.    . pantoprazole (PROTONIX) 40 MG tablet Take 40 mg by mouth daily as needed (acid reflux).     . rosuvastatin (CRESTOR) 10 MG tablet Take 10 mg by mouth 2 (two) times a week.    . Vitamin D, Cholecalciferol, 400 UNITS CAPS Take 800 Units by mouth daily.    [name] warfarin (COUMADIN) 2.5 MG tablet TAKE 1-2 TABLETS BY MOUTH DAILY AS DIRECTED BY COUMADIN CLINIC. 45 tablet 3   No current facility-administered medications for this visit.     Allergies:   Atorvastatin; Codeine; Latex; and Penicillins   Social History:  The patient  reports that she has never smoked. She has never used smokeless tobacco. She reports that she does not use drugs.   Family History:  The patient's  family history includes Alcoholism in her father; Breast cancer in her maternal aunt; CVA in her other; Cancer in her father, maternal aunt, and maternal grandmother; Diabetes in her mother; Heart disease in her other; High Cholesterol in her mother; Hyperlipidemia in her other; Hypertension in her mother and other; Memory loss in her mother.    ROS:  Please see the history of present illness.   All other systems are personally reviewed and negative.    PHYSICAL EXAM: VS:  BP 114/78   Pulse (!) 51   Ht 5\' 5"  (1.651 m)   Wt 142 lb (64.4 kg)   BMI 23.63 kg/m  , BMI Body mass index is 23.63 kg/m. GEN: Well nourished, well developed, in no acute distress  HEENT: normal  Neck: no JVD, carotid bruits, or masses Cardiac: RRR; no murmurs, rubs, or gallops,no edema  Respiratory:  clear to auscultation bilaterally, normal work of breathing GI: soft, nontender, nondistended, + BS MS: no deformity or atrophy  Skin: warm and dry  Neuro:  Strength and sensation are intact Psych: euthymic mood, full affect  EKG:  EKG 12/10/16 reveals sinus bradycardia 43 bpm, incomplete  RBBB, diffuse twi   Recent Labs: 11/21/2016: ALT 15 12/09/2016: BUN 17; Creatinine, Ser 1.19; Hemoglobin 12.6; Platelets 266; Potassium 4.1; Sodium 138  personally reviewed   Lipid Panel     Component Value Date/Time   CHOL 181 12/26/2013 0824   TRIG 224.0 (H) 12/26/2013 0824   HDL 46.00 12/26/2013 0824   CHOLHDL 4 12/26/2013 0824   VLDL 44.8 (H) 12/26/2013 0824   LDLCALC 68 05/09/2013 0940   LDLDIRECT 106.6 12/26/2013 0824   personally reviewed   Wt Readings from Last 3 Encounters:  12/12/16 142 lb (64.4 kg)  12/10/16 140 lb 1.9 oz (  63.6 kg)  12/09/16 140 lb (63.5 kg)      Other studies personally reviewed: Additional studies/ records that were reviewed today include: Leta Speller notes, Dr Darliss Ridgel notes, recent myoview , echo Review of the above records today demonstrates: echo 12/02/16 reveals EF 55-60%, mild to moderate LA enlargement,  Mild MR   ASSESSMENT AND PLAN:  1.  Sick sinus syndrome The patient has symptomatic bradycardia.  Recent ETT revealed chronotropic incompetence.  The patient has symptomatic bradycardia.  No reversible causes are found.  I would therefore recommend pacemaker implantation at this time.  Risks, benefits, alternatives to pacemaker implantation were discussed in detail with the patient today. The patient understands that the risks include but are not limited to bleeding, infection, pneumothorax, perforation, tamponade, vascular damage, renal failure, MI, stroke, death, and lead dislodgement and wishes to proceed. We will therefore schedule the procedure at the next available time.  BluSync study discussed with the patient.  She is very interested.  2. Paroxysmal atrial fibrillation The patient has rare afib episodes.  Will reassess after PPM is implanted.  chads2vasc score is 5.  Continue anticoagulation long term.    Current medicines are reviewed at length with the patient today.   The patient does not have concerns regarding her medicines.   The following changes were made today:  none  Labs/ tests ordered today include:   Orders Placed This Encounter  Procedures  . Basic metabolic panel  . CBC with Differential     Signed, Thompson Grayer, MD  12/12/2016 4:28 PM     [place] [address] [place] [address] [phone] (office) [phone] (fax)

## 2016-12-19 NOTE — Discharge Instructions (Signed)
° ° °  Supplemental Discharge Instructions for  Pacemaker/Defibrillator Patients  Activity No heavy lifting or vigorous activity with your left/right arm for 6 to 8 weeks.  Do not raise your left/right arm above your head for one week.  Gradually raise your affected arm as drawn below.           __       12/23/16                      12/24/16                  12/25/16                    12/26/16  NO DRIVING for    1 week ; you may begin driving on   4/49/67  .  WOUND CARE - Keep the wound area clean and dry.  Do not get this area wet for one week. No showers for one week; you may shower on   12/26/16  . - The tape/steri-strips on your wound will fall off; do not pull them off.  No bandage is needed on the site.  DO  NOT apply any creams, oils, or ointments to the wound area. - If you notice any drainage or discharge from the wound, any swelling or bruising at the site, or you develop a fever > 101? F after you are discharged home, call the office at once.  Special Instructions - You are still able to use cellular telephones; use the ear opposite the side where you have your pacemaker/defibrillator.  Avoid carrying your cellular phone near your device. - When traveling through airports, show security personnel your identification card to avoid being screened in the metal detectors.  Ask the security personnel to use the hand wand. - Avoid arc welding equipment, MRI testing (magnetic resonance imaging), TENS units (transcutaneous nerve stimulators).  Call the office for questions about other devices. - Avoid electrical appliances that are in poor condition or are not properly grounded. - Microwave ovens are safe to be near or to operate.

## 2016-12-19 NOTE — Progress Notes (Signed)
Pt leaves cath lab holding area in stable condition. Lt shoulder site is CDI. Family is traveling with pt to room.

## 2016-12-19 NOTE — Discharge Summary (Signed)
ELECTROPHYSIOLOGY PROCEDURE DISCHARGE SUMMARY    Patient ID: [name],  MRN: [id number], DOB/AGE: [birth year] 71 y.o.  Admit date: 12/19/2016 Discharge date: 12/19/2016  Primary Care Physician: Aura Dials, MD Primary Cardiologist: Tamala Julian Electrophysiologist: Lyrika Souders  Primary Discharge Diagnosis:  Symptomatic sick sinus syndrome status post pacemaker implantation this admission  Secondary Discharge Diagnosis:  1.  Paroxysmal atrial fibrillation 2.  HTN 3.  Hyperlipidemia  Allergies  Allergen Reactions  . Atorvastatin Other (See Comments)    Intolerant of multiple statins Muscle weakness   . Codeine Nausea Only  . Latex Rash  . Penicillins Rash    Has patient had a PCN reaction causing immediate rash, facial/tongue/throat swelling, SOB or lightheadedness with hypotension: Rash in hands  Has patient had a PCN reaction causing severe rash involving mucus membranes or skin necrosis: no Has patient had a PCN reaction that required hospitalization No Has patient had a PCN reaction occurring within the last 10 years: No  If all of the above answers are "NO", then may proceed with Cephalosporin use.      Procedures This Admission:  1.  Implantation of a MDT dual chamber PPM on 12/19/16 by Dr Rayann Heman.  The patient received a MDT model number Azure PPM with model number 5076 right atrial lead and 5076 right ventricular lead. There were no immediate post procedure complications. 2.  CXR on 12/20/16 demonstrated no pneumothorax status post device implantation.   Brief HPI: [name] is a 71 y.o. female was referred to electrophysiology in the outpatient setting for consideration of PPM implantation.  Past medical history includes sick sinus syndrome and paroxysmal atrial fibrillation.  The patient has had symptomatic bradycardia without reversible causes identified.  Risks, benefits, and alternatives to PPM implantation were reviewed with the patient who wished to  proceed.   Hospital Course:  The patient was admitted and underwent implantation of a MDT dual chamber PPM with details as outlined above.  She  was monitored on telemetry which demonstrated atrial pacing with intrinsic ventricular conduction.  Left chest was without hematoma or ecchymosis.  The device was interrogated and found to be functioning normally.  CXR was obtained and demonstrated no pneumothorax status post device implantation.  Wound care, arm mobility, and restrictions were reviewed with the patient.  The patient was examined and considered stable for discharge to home.   This patients CHA2DS2-VASc Score and unadjusted Ischemic Stroke Rate (% per year) is equal to 7.2 % stroke rate/year from a score of 5 Above score calculated as 1 point each if present [CHF, HTN, DM, Vascular=MI/PAD/Aortic Plaque, Age if 65-74, or Female] Above score calculated as 2 points each if present [Age > 75, or Stroke/TIA/TE]    Physical Exam: Vitals:   12/19/16 1135 12/19/16 1156 12/19/16 1203 12/19/16 1208  BP:    (!) 141/95  Pulse: (!) 59   61  Resp: 16     Temp:    98 F (36.7 C)  TempSrc:    Oral  SpO2: 97%   98%  Weight:   139 lb 14.4 oz (63.5 kg)   Height:  5\' 5"  (1.651 m)      GEN- The patient is well appearing, alert and oriented x 3 today.   HEENT: normocephalic, atraumatic; sclera clear, conjunctiva pink; hearing intact; oropharynx clear; neck supple  Lungs- Clear to ausculation bilaterally, normal work of breathing.  No wheezes, rales, rhonchi Heart- Regular rate and rhythm, no murmurs, rubs or gallops  GI- soft,  non-tender, non-distended, bowel sounds present  Extremities- no clubbing, cyanosis, or edema; DP/PT/radial pulses 2+ bilaterally MS- no significant deformity or atrophy Skin- warm and dry, no rash or lesion, left chest without hematoma/ecchymosis Psych- euthymic mood, full affect Neuro- strength and sensation are intact   Labs:   Lab Results  Component Value Date    WBC 6.8 12/12/2016   HGB 12.3 12/12/2016   HCT 38.9 12/12/2016   MCV 83 12/12/2016   PLT 285 12/12/2016    No results for input(s): NA, K, CL, CO2, BUN, CREATININE, CALCIUM, PROT, BILITOT, ALKPHOS, ALT, AST, GLUCOSE in the last 168 hours.  Invalid input(s): LABALBU  Discharge Medications:  Allergies as of 12/19/2016      Reactions   Atorvastatin Other (See Comments)   Intolerant of multiple statins Muscle weakness    Codeine Nausea Only   Latex Rash   Penicillins Rash   Has patient had a PCN reaction causing immediate rash, facial/tongue/throat swelling, SOB or lightheadedness with hypotension: Rash in hands  Has patient had a PCN reaction causing severe rash involving mucus membranes or skin necrosis: no Has patient had a PCN reaction that required hospitalization No Has patient had a PCN reaction occurring within the last 10 years: No  If all of the above answers are "NO", then may proceed with Cephalosporin use.      Medication List    TAKE these medications   acetaminophen 500 MG tablet Commonly known as:  TYLENOL Take 1,000 mg by mouth every 6 (six) hours as needed for moderate pain.   BACITRACIN EX Apply 1 application topically 2 (two) times daily as needed (for wound care/scratches).   clonazePAM 0.5 MG tablet Commonly known as:  KLONOPIN Take 0.25-0.5 mg by mouth every evening.   cyanocobalamin 1000 MCG tablet Take 1,000 mcg by mouth daily.   FISH OIL TRIPLE STRENGTH 1400 MG Caps Take 1,400 mg by mouth daily.   flecainide 100 MG tablet Commonly known as:  TAMBOCOR Take 2 tablets (200 mg total) by mouth as needed. Take 2 tablets as needed 1 hour after you have taken Toprol if still in A-fib   FLUoxetine 20 MG capsule Commonly known as:  PROZAC Take 20 mg by mouth daily with breakfast.   FLUoxetine 10 MG capsule Commonly known as:  PROZAC Take 10 mg by mouth daily with breakfast.   fluticasone 50 MCG/ACT nasal spray Commonly known as:  FLONASE Place 2  sprays into the nose daily as needed for allergies.   levothyroxine 88 MCG tablet Commonly known as:  SYNTHROID, LEVOTHROID Take 88 mcg by mouth daily before breakfast.   loratadine 10 MG tablet Commonly known as:  CLARITIN Take 10 mg by mouth daily as needed for allergies.   metoprolol succinate 25 MG 24 hr tablet Commonly known as:  TOPROL XL Take 0.5 tablets (12.5 mg total) by mouth daily as needed. For recurrent A-fib   pantoprazole 40 MG tablet Commonly known as:  PROTONIX Take 40 mg by mouth daily as needed (acid reflux).   rosuvastatin 10 MG tablet Commonly known as:  CRESTOR Take 10 mg by mouth 2 (two) times a week. Tuesday & Thursday.   Vitamin D (Cholecalciferol) 400 units Caps Take 800 Units by mouth daily.   warfarin 2.5 MG tablet Commonly known as:  COUMADIN TAKE 1-2 TABLETS BY MOUTH DAILY AS DIRECTED BY COUMADIN CLINIC. What changed:  See the new instructions.            Discharge Care Instructions  Start     Ordered   12/19/16 0000  Increase activity slowly     12/19/16 1511   12/19/16 0000  Diet - low sodium heart healthy     12/19/16 1511      Disposition:  Discharge Instructions    Diet - low sodium heart healthy    Complete by:  As directed    Increase activity slowly    Complete by:  As directed      Follow-up Information    [facility] Follow up on 01/01/2017.   Specialty:  Cardiology Why:  at 10:30AM for wound check  Contact information: [address], [place]       Thompson Grayer, MD Follow up on 03/23/2017.   Specialty:  Cardiology Why:  at 11:45AM Contact information: [place] [address] [phone]           Duration of Discharge Encounter: Greater than 30 minutes including physician time.  Signed, Chanetta Marshall, NP 12/19/2016 3:11 PM   I have seen, examined the patient, and reviewed the above  assessment and plan.  No hematoma.  No concerns.  Changes to above are made where necessary.    DC to home with routine wound care and follow-up.  Co Sign: Thompson Grayer, MD 12/19/2016 3:20 PM

## 2016-12-19 NOTE — Interval H&P Note (Signed)
History and Physical Interval Note:  12/19/2016 8:02 AM  [name]  has presented today for surgery, with the diagnosis of tachy brady syndrome  The various methods of treatment have been discussed with the patient and family. After consideration of risks, benefits and other options for treatment, the patient has consented to  Procedure(s): Pacemaker Implant (N/A) as a surgical intervention .  The patient's history has been reviewed, patient examined, no change in status, stable for surgery.  I have reviewed the patient's chart and labs.  Questions were answered to the patient's satisfaction.     Thompson Grayer

## 2016-12-24 NOTE — Progress Notes (Signed)
Cardiology Office Note    Date:  12/25/2016   ID:  [name], DOB 26-Nov-1945, MRN [id number]  PCP:  Aura Dials, MD  Cardiologist: Sinclair Grooms, MD   Chief Complaint  Patient presents with  . Atrial Fibrillation    History of Present Illness:  [name] is a 71 y.o. female paroxysmal atrial fibrillation, embolic CVA which led to the diagnosis of atrial fibrillation, chronic Coumadin anticoagulation, subsequent development of tachycardia bradycardia syndrome, ultimately treated with DDD pacemaker 12/20/2016.  She is doing well. Pacer was placed 12/20/2016. She has had palpitations since the paste was placed. She was told that that was related to PVCs. She has not had syncope, lightheadedness, dizziness, or perceived atrial fibrillation. No chills or fever.  Past Medical History:  Diagnosis Date  . Acoustic neuroma (HCC)    Hx of right ear acoustic neuroma, removed in 1999. No hearing in right ear.  [name] Anxiety    Hx of, responds to Wellbutrin  . Basal cell carcinoma    s/p MOHS surgery in 03/2011  . Cataract   . Deafness in right ear   . Headache(784.0)    MIGRAINES  . Hearing loss    Only in right ear, from an acoustic neuroma. S/P removal in 1999.   [name] History of neck pain    Responds to Flexeril  . History of shingles   . Hypercholesteremia   . Hypertension   . PAF (paroxysmal atrial fibrillation) ([facility])    2 brief episodes of Afib recorded on monitor 02/2007  . PONV (postoperative nausea and vomiting)   . Sinus bradycardia   . TIA (transient ischemic attack) 2011   On coumadin. Asymptomatic, without recurrence.    Past Surgical History:  Procedure Laterality Date  . BREAST BIOPSY     X 3  . BREAST CYST ASPIRATION Left 50 yrs ago per pt  . BREAST EXCISIONAL BIOPSY Left 50 yrs ago  . CHOLECYSTECTOMY N/A 05/26/2013   Procedure: LAPAROSCOPIC CHOLECYSTECTOMY WITH INTRAOPERATIVE CHOLANGIOGRAM;  Surgeon: Imogene Burn. Georgette Dover, MD;  Location: [facility];   Service: General;  Laterality: N/A;  . CRANIECTOMY FOR EXCISION OF ACOUSTIC NEUROMA Right 1999  . INTRAOCULAR LENS INSERTION     Dr. Talbert Forest  . MOHS SURGERY  03/2011   Basal cell skin cancer  . PACEMAKER IMPLANT N/A 12/19/2016   Procedure: Pacemaker Implant;  Surgeon: Thompson Grayer, MD;  Location: [facility];  Service: Cardiovascular;  Laterality: N/A;  . TONSILLECTOMY      Current Medications: Outpatient Medications Prior to Visit  Medication Sig Dispense Refill  . acetaminophen (TYLENOL) 500 MG tablet Take 1,000 mg by mouth every 6 (six) hours as needed for moderate pain.    [name] BACITRACIN EX Apply 1 application topically 2 (two) times daily as needed (for wound care/scratches).    . clonazePAM (KLONOPIN) 0.5 MG tablet Take 0.25-0.5 mg by mouth every evening.     . cyanocobalamin 1000 MCG tablet Take 1,000 mcg by mouth daily.     . flecainide (TAMBOCOR) 100 MG tablet Take 2 tablets (200 mg total) by mouth as needed. Take 2 tablets as needed 1 hour after you have taken Toprol if still in A-fib 30 tablet 11  . FLUoxetine (PROZAC) 10 MG capsule Take 10 mg by mouth daily with breakfast.     . FLUoxetine (PROZAC) 20 MG capsule Take 20 mg by mouth daily with breakfast.     . fluticasone (FLONASE) 50 MCG/ACT nasal spray  Place 2 sprays into the nose daily as needed for allergies.     [name] levothyroxine (SYNTHROID, LEVOTHROID) 88 MCG tablet Take 88 mcg by mouth daily before breakfast.    . loratadine (CLARITIN) 10 MG tablet Take 10 mg by mouth daily as needed for allergies.    . metoprolol succinate (TOPROL XL) 25 MG 24 hr tablet Take 0.5 tablets (12.5 mg total) by mouth daily as needed. For recurrent A-fib 30 tablet 11  . Omega-3 Fatty Acids (FISH OIL TRIPLE STRENGTH) 1400 MG CAPS Take 1,400 mg by mouth daily.     . pantoprazole (PROTONIX) 40 MG tablet Take 40 mg by mouth daily as needed (acid reflux).     . rosuvastatin (CRESTOR) 10 MG tablet Take 10 mg by mouth 2 (two) times a week. Tuesday &  Thursday.    . Vitamin D, Cholecalciferol, 400 UNITS CAPS Take 800 Units by mouth daily.    [name] warfarin (COUMADIN) 2.5 MG tablet TAKE 1-2 TABLETS BY MOUTH DAILY AS DIRECTED BY COUMADIN CLINIC. (Patient taking differently: Take 1 tablet daily (2.5 mg) except Sunday, Tuesday, and Thursday take 2 tablets (5 mg)) 45 tablet 3   No facility-administered medications prior to visit.      Allergies:   Atorvastatin; Codeine; Latex; and Penicillins   Social History   Social History  . Marital status: Married    Spouse name: [name]  . Number of children: 3  . Years of education: masters   Occupational History  . retired     [employer]   Social History Main Topics  . Smoking status: Never Smoker  . Smokeless tobacco: Never Used  . Alcohol use None     Comment: Rare  . Drug use: No  . Sexual activity: No   Other Topics Concern  . None   Social History Narrative   Pt lives with spouse.    Caffeine Use: 1-2 glasses per week.     Family History:  The patient's family history includes Alcoholism in her father; Breast cancer in her maternal aunt; CVA in her other; Cancer in her father, maternal aunt, and maternal grandmother; Diabetes in her mother; Heart disease in her other; High Cholesterol in her mother; Hyperlipidemia in her other; Hypertension in her mother and other; Memory loss in her mother.   ROS:   Please see the history of present illness.    Some level of anxiety following the pacemaker especially around the palpitations that she now has which are new. She has itching in the incision area. All other systems reviewed and are negative.   PHYSICAL EXAM:   VS:  BP 120/82 (BP Location: Right Arm)   Pulse (!) 57   Ht 5\' 5"  (1.651 m)   Wt 142 lb (64.4 kg)   BMI 23.63 kg/m    GEN: Well nourished, well developed, in no acute distress  HEENT: normal  Neck: no JVD, carotid bruits, or masses Cardiac: RRR; no murmurs, rubs, or gallops,no edema  Respiratory:  clear to auscultation  bilaterally, normal work of breathing. Left subclavicular area as Steri-Strips from recent pacemaker implantation. No erythema or bleeding. GI: soft, nontender, nondistended, + BS MS: no deformity or atrophy  Skin: warm and dry, no rash Neuro:  Alert and Oriented x 3, Strength and sensation are intact Psych: euthymic mood, full affect  Wt Readings from Last 3 Encounters:  12/25/16 142 lb (64.4 kg)  12/19/16 139 lb 14.4 oz (63.5 kg)  12/12/16 142 lb (64.4 kg)  Studies/Labs Reviewed:   EKG:  EKG  Not done  Recent Labs: 11/21/2016: ALT 15 12/12/2016: BUN 11; Creatinine, Ser 0.74; Hemoglobin 12.3; Platelets 285; Potassium 5.1; Sodium 143   Lipid Panel    Component Value Date/Time   CHOL 181 12/26/2013 0824   TRIG 224.0 (H) 12/26/2013 0824   HDL 46.00 12/26/2013 0824   CHOLHDL 4 12/26/2013 0824   VLDL 44.8 (H) 12/26/2013 0824   LDLCALC 68 05/09/2013 0940   LDLDIRECT 106.6 12/26/2013 0824    Additional studies/ records that were reviewed today include:  none    ASSESSMENT:    1. Paroxysmal atrial fibrillation (HCC)   2. Tachycardia-bradycardia syndrome ([facility])   3. Chronic anticoagulation   4. Cerebrovascular accident (CVA) due to embolism of middle cerebral artery, unspecified blood vessel laterality (HCC)      PLAN:  In order of problems listed above:  1. Stable without any perceived episodes of atrial fibrillation since pacemaker insertion. Pillow the pocket flecainide and metoprolol if needed. 2. Manifested by PAF and excessive sinus bradycardia and sinus rhythm. No current antiarrhythmic therapy. For the time being and she will use metoprolol and flecainide if needed. EP follow-up in 6 weeks. 3. Continue Coumadin therapy. 4. Not addressed.  Reassurance concerning palpitations. Encouraged her to discuss this with Dr. Rayann Heman at their visit. Increase physical activity within guidelines of post pacemaker instructions. Clinical follow-up with me in 6  months.    Medication Adjustments/Labs and Tests Ordered: Current medicines are reviewed at length with the patient today.  Concerns regarding medicines are outlined above.  Medication changes, Labs and Tests ordered today are listed in the Patient Instructions below. There are no Patient Instructions on file for this visit.   Signed, Sinclair Grooms, MD  12/25/2016 9:40 AM    [address], [place], [place]  [address] Phone: [phone]; Fax: [phone]

## 2016-12-25 ENCOUNTER — Encounter (INDEPENDENT_AMBULATORY_CARE_PROVIDER_SITE_OTHER): Payer: Self-pay

## 2016-12-25 ENCOUNTER — Ambulatory Visit (INDEPENDENT_AMBULATORY_CARE_PROVIDER_SITE_OTHER): Payer: Medicare Other | Admitting: *Deleted

## 2016-12-25 ENCOUNTER — Ambulatory Visit (INDEPENDENT_AMBULATORY_CARE_PROVIDER_SITE_OTHER): Payer: Medicare Other | Admitting: Interventional Cardiology

## 2016-12-25 ENCOUNTER — Encounter: Payer: Self-pay | Admitting: Interventional Cardiology

## 2016-12-25 VITALS — BP 120/82 | HR 57 | Ht 65.0 in | Wt 142.0 lb

## 2016-12-25 DIAGNOSIS — I495 Sick sinus syndrome: Secondary | ICD-10-CM

## 2016-12-25 DIAGNOSIS — Z5181 Encounter for therapeutic drug level monitoring: Secondary | ICD-10-CM

## 2016-12-25 DIAGNOSIS — Z7901 Long term (current) use of anticoagulants: Secondary | ICD-10-CM | POA: Diagnosis not present

## 2016-12-25 DIAGNOSIS — I480 Paroxysmal atrial fibrillation: Secondary | ICD-10-CM

## 2016-12-25 DIAGNOSIS — I63419 Cerebral infarction due to embolism of unspecified middle cerebral artery: Secondary | ICD-10-CM | POA: Diagnosis not present

## 2016-12-25 LAB — POCT INR: INR: 2.5

## 2016-12-25 NOTE — Patient Instructions (Signed)
Medication Instructions:  None  Labwork: None  Testing/Procedures: None  Follow-Up: Your physician wants you to follow-up in: 6 months with Dr. Smith.  You will receive a reminder letter in the mail two months in advance. If you don't receive a letter, please call our office to schedule the follow-up appointment.   Any Other Special Instructions Will Be Listed Below (If Applicable).     If you need a refill on your cardiac medications before your next appointment, please call your pharmacy.   

## 2016-12-29 MED FILL — Gentamicin Sulfate Inj 40 MG/ML: INTRAMUSCULAR | Qty: 2 | Status: AC

## 2016-12-29 MED FILL — Sodium Chloride Irrigation Soln 0.9%: Qty: 500 | Status: AC

## 2017-01-01 ENCOUNTER — Ambulatory Visit (INDEPENDENT_AMBULATORY_CARE_PROVIDER_SITE_OTHER): Payer: Medicare Other | Admitting: *Deleted

## 2017-01-01 DIAGNOSIS — I480 Paroxysmal atrial fibrillation: Secondary | ICD-10-CM

## 2017-01-01 DIAGNOSIS — Z950 Presence of cardiac pacemaker: Secondary | ICD-10-CM | POA: Diagnosis not present

## 2017-01-01 DIAGNOSIS — I495 Sick sinus syndrome: Secondary | ICD-10-CM

## 2017-01-01 LAB — CUP PACEART INCLINIC DEVICE CHECK
Battery Remaining Longevity: 142 mo
Battery Voltage: 3.2 V
Brady Statistic AP VP Percent: 2.48 %
Brady Statistic AS VS Percent: 5.42 %
Brady Statistic RA Percent Paced: 96.29 %
Brady Statistic RV Percent Paced: 2.49 %
Date Time Interrogation Session: 20180927112846
Implantable Lead Location: 753860
Implantable Lead Model: 5076
Implantable Lead Model: 5076
Lead Channel Impedance Value: 342 Ohm
Lead Channel Impedance Value: 551 Ohm
Lead Channel Pacing Threshold Amplitude: 0.75 V
Lead Channel Pacing Threshold Pulse Width: 0.4 ms
Lead Channel Sensing Intrinsic Amplitude: 3 mV
MDC IDC LEAD IMPLANT DT: 20180914
MDC IDC LEAD IMPLANT DT: 20180914
MDC IDC LEAD LOCATION: 753859
MDC IDC MSMT LEADCHNL RA IMPEDANCE VALUE: 437 Ohm
MDC IDC MSMT LEADCHNL RV IMPEDANCE VALUE: 437 Ohm
MDC IDC MSMT LEADCHNL RV PACING THRESHOLD AMPLITUDE: 1 V
MDC IDC MSMT LEADCHNL RV PACING THRESHOLD PULSEWIDTH: 0.4 ms
MDC IDC MSMT LEADCHNL RV SENSING INTR AMPL: 17.5 mV
MDC IDC PG IMPLANT DT: 20180914
MDC IDC SET LEADCHNL RA PACING AMPLITUDE: 3.5 V
MDC IDC SET LEADCHNL RV PACING AMPLITUDE: 3.5 V
MDC IDC SET LEADCHNL RV PACING PULSEWIDTH: 0.4 ms
MDC IDC SET LEADCHNL RV SENSING SENSITIVITY: 0.9 mV
MDC IDC STAT BRADY AP VS PERCENT: 92.08 %
MDC IDC STAT BRADY AS VP PERCENT: 0.01 %

## 2017-01-01 NOTE — Progress Notes (Signed)
Wound check appointment. Steri-strips removed. Wound without redness or edema. Incision edges approximated, wound well healed. Normal device function. Thresholds, sensing, and impedances consistent with implant measurements. Device programmed at 3.5V with auto capture programmed on for extra safety margin until 3 month visit. Histogram distribution appropriate for patient and level of activity. No mode switches or high ventricular rates noted, 1 fast A&V episode--1:1 SVT per EGM, duration 6sec. Patient educated about wound care, arm mobility, lifting restrictions, and BlueSync monitoring. ROV with JA on 03/23/17.

## 2017-01-05 ENCOUNTER — Ambulatory Visit (INDEPENDENT_AMBULATORY_CARE_PROVIDER_SITE_OTHER): Payer: Medicare Other | Admitting: *Deleted

## 2017-01-05 DIAGNOSIS — Z950 Presence of cardiac pacemaker: Secondary | ICD-10-CM

## 2017-01-06 LAB — CUP PACEART REMOTE DEVICE CHECK
Battery Remaining Longevity: 142 mo
Brady Statistic AP VS Percent: 94.3 %
Brady Statistic AS VS Percent: 4.32 %
Date Time Interrogation Session: 20181002151934
Implantable Lead Implant Date: 20180914
Implantable Lead Location: 753859
Lead Channel Impedance Value: 323 Ohm
Lead Channel Impedance Value: 437 Ohm
Lead Channel Pacing Threshold Amplitude: 0.875 V
Lead Channel Pacing Threshold Pulse Width: 0.4 ms
Lead Channel Sensing Intrinsic Amplitude: 13.375 mV
Lead Channel Sensing Intrinsic Amplitude: 3.625 mV
Lead Channel Sensing Intrinsic Amplitude: 3.625 mV
Lead Channel Setting Pacing Amplitude: 3.5 V
MDC IDC LEAD IMPLANT DT: 20180914
MDC IDC LEAD LOCATION: 753860
MDC IDC MSMT BATTERY VOLTAGE: 3.19 V
MDC IDC MSMT LEADCHNL RA PACING THRESHOLD AMPLITUDE: 0.625 V
MDC IDC MSMT LEADCHNL RA PACING THRESHOLD PULSEWIDTH: 0.4 ms
MDC IDC MSMT LEADCHNL RV IMPEDANCE VALUE: 456 Ohm
MDC IDC MSMT LEADCHNL RV IMPEDANCE VALUE: 570 Ohm
MDC IDC MSMT LEADCHNL RV SENSING INTR AMPL: 13.375 mV
MDC IDC PG IMPLANT DT: 20180914
MDC IDC SET LEADCHNL RA PACING AMPLITUDE: 3.5 V
MDC IDC SET LEADCHNL RV PACING PULSEWIDTH: 0.4 ms
MDC IDC SET LEADCHNL RV SENSING SENSITIVITY: 0.9 mV
MDC IDC STAT BRADY AP VP PERCENT: 1.37 %
MDC IDC STAT BRADY AS VP PERCENT: 0.01 %
MDC IDC STAT BRADY RA PERCENT PACED: 96.63 %
MDC IDC STAT BRADY RV PERCENT PACED: 1.38 %

## 2017-01-06 NOTE — Progress Notes (Signed)
Remote pacemaker transmission.   

## 2017-01-07 ENCOUNTER — Encounter: Payer: Self-pay | Admitting: Cardiology

## 2017-01-07 ENCOUNTER — Telehealth: Payer: Self-pay | Admitting: Interventional Cardiology

## 2017-01-07 NOTE — Telephone Encounter (Signed)
New message    Patient states missed 1 dose of warfarin (COUMADIN) 2.5 MG tablet of Sunday. Patient want to know if she needs to be seen prior to  10/18  To check levels because of missed dose. Please Call.  Pt c/o medication issue:  1. Name of Medication: warfarin (COUMADIN) 2.5 MG tablet  2. How are you currently taking this medication (dosage and times per day)? TAKE 1-2 TABLETS BY MOUTH DAILY AS DIRECTED BY COUMADIN CLINIC. Patient taking differently: Take 1 tablet daily (2.5 mg) except Sunday, Tuesday, and Thursday take 2 tablets (5 mg)  3. Are you having a reaction (difficulty breathing--STAT)? no  4. What is your medication issue? Missed dose 1 dose on Sunday.

## 2017-01-07 NOTE — Telephone Encounter (Signed)
Telephoned pt and instructed to take 2 tablets today to make up for the missed 1 tablet on Sunday. Continue normal dosage after this and keep regular scheduled appt.

## 2017-01-19 ENCOUNTER — Telehealth: Payer: Self-pay | Admitting: Cardiology

## 2017-01-19 ENCOUNTER — Ambulatory Visit (INDEPENDENT_AMBULATORY_CARE_PROVIDER_SITE_OTHER): Payer: Medicare Other | Admitting: *Deleted

## 2017-01-19 DIAGNOSIS — I495 Sick sinus syndrome: Secondary | ICD-10-CM

## 2017-01-19 NOTE — Telephone Encounter (Signed)
LMOVM reminding pt to send remote transmission.   

## 2017-01-19 NOTE — Progress Notes (Signed)
Remote pacemaker transmission.   

## 2017-01-21 LAB — CUP PACEART REMOTE DEVICE CHECK
Battery Voltage: 3.19 V
Brady Statistic AP VP Percent: 1.61 %
Brady Statistic RA Percent Paced: 97.26 %
Date Time Interrogation Session: 20181015173553
Implantable Lead Implant Date: 20180914
Implantable Pulse Generator Implant Date: 20180914
Lead Channel Impedance Value: 437 Ohm
Lead Channel Pacing Threshold Amplitude: 0.625 V
Lead Channel Pacing Threshold Pulse Width: 0.4 ms
Lead Channel Sensing Intrinsic Amplitude: 3.25 mV
Lead Channel Setting Sensing Sensitivity: 0.9 mV
MDC IDC LEAD IMPLANT DT: 20180914
MDC IDC LEAD LOCATION: 753859
MDC IDC LEAD LOCATION: 753860
MDC IDC MSMT BATTERY REMAINING LONGEVITY: 141 mo
MDC IDC MSMT LEADCHNL RA IMPEDANCE VALUE: 304 Ohm
MDC IDC MSMT LEADCHNL RA IMPEDANCE VALUE: 437 Ohm
MDC IDC MSMT LEADCHNL RA PACING THRESHOLD PULSEWIDTH: 0.4 ms
MDC IDC MSMT LEADCHNL RA SENSING INTR AMPL: 3.25 mV
MDC IDC MSMT LEADCHNL RV IMPEDANCE VALUE: 551 Ohm
MDC IDC MSMT LEADCHNL RV PACING THRESHOLD AMPLITUDE: 0.625 V
MDC IDC MSMT LEADCHNL RV SENSING INTR AMPL: 12.75 mV
MDC IDC MSMT LEADCHNL RV SENSING INTR AMPL: 12.75 mV
MDC IDC SET LEADCHNL RA PACING AMPLITUDE: 3.5 V
MDC IDC SET LEADCHNL RV PACING AMPLITUDE: 3.5 V
MDC IDC SET LEADCHNL RV PACING PULSEWIDTH: 0.4 ms
MDC IDC STAT BRADY AP VS PERCENT: 94.95 %
MDC IDC STAT BRADY AS VP PERCENT: 0.01 %
MDC IDC STAT BRADY AS VS PERCENT: 3.42 %
MDC IDC STAT BRADY RV PERCENT PACED: 1.62 %

## 2017-01-22 ENCOUNTER — Ambulatory Visit (INDEPENDENT_AMBULATORY_CARE_PROVIDER_SITE_OTHER): Payer: Medicare Other | Admitting: *Deleted

## 2017-01-22 DIAGNOSIS — I480 Paroxysmal atrial fibrillation: Secondary | ICD-10-CM | POA: Diagnosis not present

## 2017-01-22 DIAGNOSIS — Z5181 Encounter for therapeutic drug level monitoring: Secondary | ICD-10-CM | POA: Diagnosis not present

## 2017-01-22 LAB — POCT INR: INR: 2.8

## 2017-01-23 ENCOUNTER — Encounter: Payer: Self-pay | Admitting: Cardiology

## 2017-01-23 DIAGNOSIS — E039 Hypothyroidism, unspecified: Secondary | ICD-10-CM | POA: Diagnosis not present

## 2017-01-23 DIAGNOSIS — Z23 Encounter for immunization: Secondary | ICD-10-CM | POA: Diagnosis not present

## 2017-02-15 DIAGNOSIS — J0100 Acute maxillary sinusitis, unspecified: Secondary | ICD-10-CM | POA: Diagnosis not present

## 2017-02-15 DIAGNOSIS — J069 Acute upper respiratory infection, unspecified: Secondary | ICD-10-CM | POA: Diagnosis not present

## 2017-02-17 ENCOUNTER — Ambulatory Visit (INDEPENDENT_AMBULATORY_CARE_PROVIDER_SITE_OTHER): Payer: Medicare Other | Admitting: *Deleted

## 2017-02-17 DIAGNOSIS — Z5181 Encounter for therapeutic drug level monitoring: Secondary | ICD-10-CM | POA: Diagnosis not present

## 2017-02-17 DIAGNOSIS — I480 Paroxysmal atrial fibrillation: Secondary | ICD-10-CM | POA: Diagnosis not present

## 2017-02-17 LAB — POCT INR: INR: 3.2

## 2017-02-17 NOTE — Patient Instructions (Signed)
Skip today's dose, This Thursday on 02/19/17 only take 1 tablet,  then Continue taking 1 tablet daily except 2 tablets on Sundays, Tuesdays, and Thursdays. Recheck in 2 weeks. Call Coumadin Clinic [phone]  Main [phone] with any questions or changes or any new medications

## 2017-02-25 DIAGNOSIS — J209 Acute bronchitis, unspecified: Secondary | ICD-10-CM | POA: Diagnosis not present

## 2017-03-03 ENCOUNTER — Ambulatory Visit (INDEPENDENT_AMBULATORY_CARE_PROVIDER_SITE_OTHER): Payer: Medicare Other | Admitting: Pharmacist

## 2017-03-03 DIAGNOSIS — I480 Paroxysmal atrial fibrillation: Secondary | ICD-10-CM | POA: Diagnosis not present

## 2017-03-03 DIAGNOSIS — Z5181 Encounter for therapeutic drug level monitoring: Secondary | ICD-10-CM | POA: Diagnosis not present

## 2017-03-03 LAB — POCT INR: INR: 3.5

## 2017-03-03 NOTE — Patient Instructions (Signed)
Skip today's dose and eat an extra serving of greens. Continue taking 1 tablet daily except 2 tablets on Sundays, Tuesdays, and Thursdays. Recheck in 2 weeks. Call Coumadin Clinic [phone]  Main [phone] with any questions or changes or any new medications

## 2017-03-18 ENCOUNTER — Encounter: Payer: Self-pay | Admitting: *Deleted

## 2017-03-19 ENCOUNTER — Telehealth: Payer: Self-pay | Admitting: Internal Medicine

## 2017-03-19 NOTE — Telephone Encounter (Signed)
Made appt for 03/23/17 at 11:30am.  Called advised pt of appt date and time.

## 2017-03-19 NOTE — Telephone Encounter (Signed)
Patient calling, states that she would like to have coumadin checked on 03-23-17 when she comes to see Dr. Rayann Heman on Monday?

## 2017-03-23 ENCOUNTER — Ambulatory Visit (INDEPENDENT_AMBULATORY_CARE_PROVIDER_SITE_OTHER): Payer: Medicare Other | Admitting: Internal Medicine

## 2017-03-23 ENCOUNTER — Ambulatory Visit (INDEPENDENT_AMBULATORY_CARE_PROVIDER_SITE_OTHER): Payer: Medicare Other | Admitting: Pharmacist

## 2017-03-23 ENCOUNTER — Ambulatory Visit (INDEPENDENT_AMBULATORY_CARE_PROVIDER_SITE_OTHER): Payer: Self-pay | Admitting: *Deleted

## 2017-03-23 ENCOUNTER — Encounter: Payer: Self-pay | Admitting: Internal Medicine

## 2017-03-23 VITALS — BP 120/82 | HR 85 | Ht 65.0 in | Wt 146.2 lb

## 2017-03-23 DIAGNOSIS — I480 Paroxysmal atrial fibrillation: Secondary | ICD-10-CM

## 2017-03-23 DIAGNOSIS — I63419 Cerebral infarction due to embolism of unspecified middle cerebral artery: Secondary | ICD-10-CM | POA: Diagnosis not present

## 2017-03-23 DIAGNOSIS — I495 Sick sinus syndrome: Secondary | ICD-10-CM

## 2017-03-23 DIAGNOSIS — Z950 Presence of cardiac pacemaker: Secondary | ICD-10-CM

## 2017-03-23 DIAGNOSIS — Z5181 Encounter for therapeutic drug level monitoring: Secondary | ICD-10-CM

## 2017-03-23 LAB — POCT INR: INR: 1.8

## 2017-03-23 NOTE — Progress Notes (Signed)
PCP: Aura Dials, MD Primary Cardiologist:  Dr Tamala Julian Primary EP:  Dr Allayne Stack is a 71 y.o. female who presents today for routine electrophysiology followup.  Since last being seen in our clinic, the patient reports doing very well.  Today, she denies symptoms of palpitations, chest pain, shortness of breath,  lower extremity edema, dizziness, presyncope, or syncope.  The patient is otherwise without complaint today.   Past Medical History:  Diagnosis Date  . Acoustic neuroma (HCC)    Hx of right ear acoustic neuroma, removed in 1999. No hearing in right ear.  [name] Anxiety    Hx of, responds to Wellbutrin  . Basal cell carcinoma    s/p MOHS surgery in 03/2011  . Cataract   . Deafness in right ear   . Headache(784.0)    MIGRAINES  . Hearing loss    Only in right ear, from an acoustic neuroma. S/P removal in 1999.   [name] History of neck pain    Responds to Flexeril  . History of shingles   . Hypercholesteremia   . Hypertension   . PAF (paroxysmal atrial fibrillation) ([place])    2 brief episodes of Afib recorded on monitor 02/2007  . PONV (postoperative nausea and vomiting)   . Sinus bradycardia   . TIA (transient ischemic attack) 2011   On coumadin. Asymptomatic, without recurrence.   Past Surgical History:  Procedure Laterality Date  . BREAST BIOPSY     X 3  . BREAST CYST ASPIRATION Left 50 yrs ago per pt  . BREAST EXCISIONAL BIOPSY Left 50 yrs ago  . CHOLECYSTECTOMY N/A 05/26/2013   Procedure: LAPAROSCOPIC CHOLECYSTECTOMY WITH INTRAOPERATIVE CHOLANGIOGRAM;  Surgeon: Imogene Burn. Georgette Dover, MD;  Location: [facility];  Service: General;  Laterality: N/A;  . CRANIECTOMY FOR EXCISION OF ACOUSTIC NEUROMA Right 1999  . INTRAOCULAR LENS INSERTION     Dr. Talbert Forest  . MOHS SURGERY  03/2011   Basal cell skin cancer  . PACEMAKER IMPLANT N/A 12/19/2016   Procedure: Pacemaker Implant;  Surgeon: Thompson Grayer, MD;  Location: [facility];  Service: Cardiovascular;   Laterality: N/A;  . TONSILLECTOMY      ROS- all systems are reviewed and negative except as per HPI above  Current Outpatient Medications  Medication Sig Dispense Refill  . acetaminophen (TYLENOL) 500 MG tablet Take 1,000 mg by mouth every 6 (six) hours as needed for moderate pain.    [name] BACITRACIN EX Apply 1 application topically 2 (two) times daily as needed (for wound care/scratches).    . clonazePAM (KLONOPIN) 0.5 MG tablet Take 0.25-0.5 mg by mouth every evening.     . cyanocobalamin 1000 MCG tablet Take 1,000 mcg by mouth daily.     . flecainide (TAMBOCOR) 100 MG tablet Take 2 tablets (200 mg total) by mouth as needed. Take 2 tablets as needed 1 hour after you have taken Toprol if still in A-fib 30 tablet 11  . FLUoxetine (PROZAC) 10 MG capsule Take 10 mg by mouth daily with breakfast.     . FLUoxetine (PROZAC) 20 MG capsule Take 20 mg by mouth daily with breakfast.     . fluticasone (FLONASE) 50 MCG/ACT nasal spray Place 2 sprays into the nose daily as needed for allergies.     [name] levothyroxine (SYNTHROID, LEVOTHROID) 88 MCG tablet Take 88 mcg by mouth daily before breakfast.    . loratadine (CLARITIN) 10 MG tablet Take 10 mg by mouth daily as needed for allergies.    [name]  metoprolol succinate (TOPROL XL) 25 MG 24 hr tablet Take 0.5 tablets (12.5 mg total) by mouth daily as needed. For recurrent A-fib 30 tablet 11  . Omega-3 Fatty Acids (FISH OIL TRIPLE STRENGTH) 1400 MG CAPS Take 1,400 mg by mouth daily.     . pantoprazole (PROTONIX) 40 MG tablet Take 40 mg by mouth daily as needed (acid reflux).     . rosuvastatin (CRESTOR) 10 MG tablet Take 10 mg by mouth 2 (two) times a week. Tuesday & Thursday.    . Vitamin D, Cholecalciferol, 400 UNITS CAPS Take 800 Units by mouth daily.    [name] warfarin (COUMADIN) 2.5 MG tablet TAKE 1-2 TABLETS BY MOUTH DAILY AS DIRECTED BY COUMADIN CLINIC. (Patient taking differently: Take 1 tablet daily (2.5 mg) except Sunday, Tuesday, and Thursday take 2 tablets (5  mg)) 45 tablet 3   No current facility-administered medications for this visit.     Physical Exam: Vitals:   03/23/17 1136  BP: 120/82  Pulse: 85  SpO2: 97%  Weight: 146 lb 3.2 oz (66.3 kg)  Height: 5\' 5"  (1.651 m)    GEN- The patient is well appearing, alert and oriented x 3 today.   Head- normocephalic, atraumatic Eyes-  Sclera clear, conjunctiva pink Ears- hearing intact Oropharynx- clear Lungs- Clear to ausculation bilaterally, normal work of breathing Chest- pacemaker pocket is well healed Heart- Regular rate and rhythm, no murmurs, rubs or gallops, PMI not laterally displaced GI- soft, NT, ND, + BS Extremities- no clubbing, cyanosis, or edema  Pacemaker interrogation- reviewed in detail today,  See PACEART report  ekg tracing ordered today is personally reviewed and shows atrial paced rhythm 85 bpm, PR 236 msec, nonspecific ST/ T changes  Assessment and Plan:  1. Symptomatic sinus bradycardia  Normal pacemaker function See Pace Art report No changes today  2. Paroxysmal atrial fibrillation Well controlled chads2vasc score is 5.  Continue anticoagulation  [facility] Return to see EP NP in 12 months Follow-up with Dr Tamala Julian as scheduled  Thompson Grayer MD, [facility] 03/23/2017 11:54 AM

## 2017-03-23 NOTE — Patient Instructions (Addendum)
Medication Instructions:  Your physician recommends that you continue on your current medications as directed. Please refer to the Current Medication list given to you today.   Labwork: None ordered   Testing/Procedures: None ordered   Follow-Up: Remote monitoring is used to monitor your Pacemaker  from home. This monitoring reduces the number of office visits required to check your device to one time per year. It allows [place] to keep an eye on the functioning of your device to ensure it is working properly. You are scheduled for a device check from home on 06/22/16. You may send your transmission at any time that day. If you have a wireless device, the transmission will be sent automatically. After your physician reviews your transmission, you will receive a postcard with your next transmission date.  Your physician wants you to follow-up in: 6 months with Dr Tamala Julian and 12 months with Chanetta Marshall, NP You will receive a reminder letter in the mail two months in advance. If you don't receive a letter, please call our office to schedule the follow-up appointment.     Any Other Special Instructions Will Be Listed Below (If Applicable).     If you need a refill on your cardiac medications before your next appointment, please call your pharmacy.

## 2017-03-23 NOTE — Patient Instructions (Signed)
Description   Take 2 tablets today, then continue taking 1 tablet daily except 2 tablets on Sundays, Tuesdays, and Thursdays. Recheck in 2 weeks. Call Coumadin Clinic [phone]  Main [phone] with any questions or changes or any new medications

## 2017-03-23 NOTE — Progress Notes (Signed)
Remote pacemaker transmission.   

## 2017-03-24 LAB — CUP PACEART INCLINIC DEVICE CHECK
Battery Remaining Longevity: 153 mo
Battery Voltage: 3.15 V
Brady Statistic AP VP Percent: 2.01 %
Brady Statistic AP VS Percent: 93.37 %
Brady Statistic RV Percent Paced: 2.02 %
Implantable Lead Implant Date: 20180914
Implantable Lead Model: 5076
Implantable Pulse Generator Implant Date: 20180914
Lead Channel Impedance Value: 342 Ohm
Lead Channel Impedance Value: 437 Ohm
Lead Channel Impedance Value: 570 Ohm
Lead Channel Pacing Threshold Amplitude: 0.5 V
Lead Channel Sensing Intrinsic Amplitude: 3.625 mV
Lead Channel Setting Sensing Sensitivity: 0.9 mV
MDC IDC LEAD IMPLANT DT: 20180914
MDC IDC LEAD LOCATION: 753859
MDC IDC LEAD LOCATION: 753860
MDC IDC MSMT LEADCHNL RA PACING THRESHOLD PULSEWIDTH: 0.4 ms
MDC IDC MSMT LEADCHNL RA SENSING INTR AMPL: 2.875 mV
MDC IDC MSMT LEADCHNL RV IMPEDANCE VALUE: 456 Ohm
MDC IDC MSMT LEADCHNL RV PACING THRESHOLD AMPLITUDE: 0.625 V
MDC IDC MSMT LEADCHNL RV PACING THRESHOLD PULSEWIDTH: 0.4 ms
MDC IDC MSMT LEADCHNL RV SENSING INTR AMPL: 13.75 mV
MDC IDC MSMT LEADCHNL RV SENSING INTR AMPL: 16.75 mV
MDC IDC SESS DTM: 20181217164515
MDC IDC SET LEADCHNL RA PACING AMPLITUDE: 2 V
MDC IDC SET LEADCHNL RV PACING AMPLITUDE: 2.5 V
MDC IDC SET LEADCHNL RV PACING PULSEWIDTH: 0.4 ms
MDC IDC STAT BRADY AS VP PERCENT: 0.01 %
MDC IDC STAT BRADY AS VS PERCENT: 4.61 %
MDC IDC STAT BRADY RA PERCENT PACED: 96.4 %

## 2017-03-25 ENCOUNTER — Encounter: Payer: Self-pay | Admitting: Cardiology

## 2017-03-27 LAB — CUP PACEART REMOTE DEVICE CHECK
Brady Statistic AP VP Percent: 2.15 %
Brady Statistic AS VP Percent: 0.01 %
Brady Statistic RA Percent Paced: 96.17 %
Brady Statistic RV Percent Paced: 2.16 %
Implantable Lead Implant Date: 20180914
Implantable Lead Location: 753860
Implantable Lead Model: 5076
Implantable Lead Model: 5076
Lead Channel Impedance Value: 361 Ohm
Lead Channel Impedance Value: 456 Ohm
Lead Channel Impedance Value: 589 Ohm
Lead Channel Pacing Threshold Pulse Width: 0.4 ms
Lead Channel Pacing Threshold Pulse Width: 0.4 ms
Lead Channel Sensing Intrinsic Amplitude: 14 mV
Lead Channel Setting Pacing Amplitude: 3 V
Lead Channel Setting Pacing Pulse Width: 0.4 ms
MDC IDC LEAD IMPLANT DT: 20180914
MDC IDC LEAD LOCATION: 753859
MDC IDC MSMT BATTERY REMAINING LONGEVITY: 125 mo
MDC IDC MSMT BATTERY VOLTAGE: 3.15 V
MDC IDC MSMT LEADCHNL RA IMPEDANCE VALUE: 437 Ohm
MDC IDC MSMT LEADCHNL RA PACING THRESHOLD AMPLITUDE: 0.5 V
MDC IDC MSMT LEADCHNL RA SENSING INTR AMPL: 2.875 mV
MDC IDC MSMT LEADCHNL RA SENSING INTR AMPL: 2.875 mV
MDC IDC MSMT LEADCHNL RV PACING THRESHOLD AMPLITUDE: 0.625 V
MDC IDC MSMT LEADCHNL RV SENSING INTR AMPL: 14 mV
MDC IDC PG IMPLANT DT: 20180914
MDC IDC SESS DTM: 20181217002930
MDC IDC SET LEADCHNL RV PACING AMPLITUDE: 3 V
MDC IDC SET LEADCHNL RV SENSING SENSITIVITY: 0.9 mV
MDC IDC STAT BRADY AP VS PERCENT: 92.92 %
MDC IDC STAT BRADY AS VS PERCENT: 4.92 %

## 2017-04-08 ENCOUNTER — Ambulatory Visit (INDEPENDENT_AMBULATORY_CARE_PROVIDER_SITE_OTHER): Payer: Medicare Other | Admitting: *Deleted

## 2017-04-08 DIAGNOSIS — Z5181 Encounter for therapeutic drug level monitoring: Secondary | ICD-10-CM | POA: Diagnosis not present

## 2017-04-08 DIAGNOSIS — I480 Paroxysmal atrial fibrillation: Secondary | ICD-10-CM | POA: Diagnosis not present

## 2017-04-08 DIAGNOSIS — I63419 Cerebral infarction due to embolism of unspecified middle cerebral artery: Secondary | ICD-10-CM

## 2017-04-08 LAB — POCT INR: INR: 2.4

## 2017-04-08 NOTE — Patient Instructions (Signed)
Description   Continue taking 1 tablet daily except 2 tablets on Sundays, Tuesdays, and Thursdays. Recheck in 3 weeks. Call Coumadin Clinic [phone]  Main [phone] with any questions or changes or any new medications

## 2017-04-24 ENCOUNTER — Ambulatory Visit (INDEPENDENT_AMBULATORY_CARE_PROVIDER_SITE_OTHER): Payer: Medicare Other | Admitting: Pharmacist

## 2017-04-24 ENCOUNTER — Ambulatory Visit (INDEPENDENT_AMBULATORY_CARE_PROVIDER_SITE_OTHER): Payer: Medicare Other | Admitting: *Deleted

## 2017-04-24 DIAGNOSIS — Z5181 Encounter for therapeutic drug level monitoring: Secondary | ICD-10-CM | POA: Diagnosis not present

## 2017-04-24 DIAGNOSIS — I495 Sick sinus syndrome: Secondary | ICD-10-CM

## 2017-04-24 DIAGNOSIS — I480 Paroxysmal atrial fibrillation: Secondary | ICD-10-CM | POA: Diagnosis not present

## 2017-04-24 LAB — POCT INR: INR: 2.7

## 2017-04-24 NOTE — Progress Notes (Signed)
Remote pacemaker transmission.   

## 2017-04-24 NOTE — Patient Instructions (Signed)
Description   Continue taking 1 tablet daily except 2 tablets on Sundays, Tuesdays, and Thursdays. Recheck in 4 weeks. Call Coumadin Clinic [phone]  Main [phone] with any questions or changes or any new medications

## 2017-04-25 ENCOUNTER — Other Ambulatory Visit: Payer: Self-pay | Admitting: Interventional Cardiology

## 2017-04-27 ENCOUNTER — Encounter: Payer: Self-pay | Admitting: Cardiology

## 2017-05-13 LAB — CUP PACEART REMOTE DEVICE CHECK
Battery Remaining Longevity: 160 mo
Battery Voltage: 3.15 V
Brady Statistic AP VP Percent: 1.27 %
Brady Statistic RV Percent Paced: 1.27 %
Date Time Interrogation Session: 20190118041820
Implantable Lead Implant Date: 20180914
Implantable Lead Implant Date: 20180914
Implantable Lead Location: 753859
Implantable Lead Model: 5076
Implantable Pulse Generator Implant Date: 20180914
Lead Channel Impedance Value: 456 Ohm
Lead Channel Impedance Value: 570 Ohm
Lead Channel Pacing Threshold Amplitude: 0.5 V
Lead Channel Sensing Intrinsic Amplitude: 3.25 mV
Lead Channel Setting Pacing Amplitude: 1.5 V
Lead Channel Setting Pacing Amplitude: 2.5 V
Lead Channel Setting Sensing Sensitivity: 0.9 mV
MDC IDC LEAD LOCATION: 753860
MDC IDC MSMT LEADCHNL RA IMPEDANCE VALUE: 342 Ohm
MDC IDC MSMT LEADCHNL RA IMPEDANCE VALUE: 456 Ohm
MDC IDC MSMT LEADCHNL RA PACING THRESHOLD PULSEWIDTH: 0.4 ms
MDC IDC MSMT LEADCHNL RA SENSING INTR AMPL: 3.25 mV
MDC IDC MSMT LEADCHNL RV PACING THRESHOLD AMPLITUDE: 0.625 V
MDC IDC MSMT LEADCHNL RV PACING THRESHOLD PULSEWIDTH: 0.4 ms
MDC IDC MSMT LEADCHNL RV SENSING INTR AMPL: 15.875 mV
MDC IDC MSMT LEADCHNL RV SENSING INTR AMPL: 15.875 mV
MDC IDC SET LEADCHNL RV PACING PULSEWIDTH: 0.4 ms
MDC IDC STAT BRADY AP VS PERCENT: 94.38 %
MDC IDC STAT BRADY AS VP PERCENT: 0.01 %
MDC IDC STAT BRADY AS VS PERCENT: 4.34 %
MDC IDC STAT BRADY RA PERCENT PACED: 96.31 %

## 2017-05-20 ENCOUNTER — Ambulatory Visit (INDEPENDENT_AMBULATORY_CARE_PROVIDER_SITE_OTHER): Payer: Medicare Other | Admitting: Pharmacist

## 2017-05-20 DIAGNOSIS — Z5181 Encounter for therapeutic drug level monitoring: Secondary | ICD-10-CM | POA: Diagnosis not present

## 2017-05-20 DIAGNOSIS — I480 Paroxysmal atrial fibrillation: Secondary | ICD-10-CM | POA: Diagnosis not present

## 2017-05-20 LAB — POCT INR: INR: 2.9

## 2017-05-20 NOTE — Patient Instructions (Signed)
Description   Continue taking 1 tablet daily except 2 tablets on Sundays, Tuesdays, and Thursdays. Recheck in 4 weeks. Call Coumadin Clinic [phone]  Main [phone] with any questions or changes or any new medications

## 2017-05-29 DIAGNOSIS — D1801 Hemangioma of skin and subcutaneous tissue: Secondary | ICD-10-CM | POA: Diagnosis not present

## 2017-05-29 DIAGNOSIS — L821 Other seborrheic keratosis: Secondary | ICD-10-CM | POA: Diagnosis not present

## 2017-05-29 DIAGNOSIS — L570 Actinic keratosis: Secondary | ICD-10-CM | POA: Diagnosis not present

## 2017-05-29 DIAGNOSIS — D225 Melanocytic nevi of trunk: Secondary | ICD-10-CM | POA: Diagnosis not present

## 2017-05-29 DIAGNOSIS — R234 Changes in skin texture: Secondary | ICD-10-CM | POA: Diagnosis not present

## 2017-06-02 DIAGNOSIS — Z1211 Encounter for screening for malignant neoplasm of colon: Secondary | ICD-10-CM | POA: Diagnosis not present

## 2017-06-02 DIAGNOSIS — Z1212 Encounter for screening for malignant neoplasm of rectum: Secondary | ICD-10-CM | POA: Diagnosis not present

## 2017-06-09 DIAGNOSIS — K5900 Constipation, unspecified: Secondary | ICD-10-CM | POA: Diagnosis not present

## 2017-06-09 DIAGNOSIS — I10 Essential (primary) hypertension: Secondary | ICD-10-CM | POA: Diagnosis not present

## 2017-06-09 DIAGNOSIS — Z7901 Long term (current) use of anticoagulants: Secondary | ICD-10-CM | POA: Diagnosis not present

## 2017-06-09 DIAGNOSIS — R350 Frequency of micturition: Secondary | ICD-10-CM | POA: Diagnosis not present

## 2017-06-09 DIAGNOSIS — G459 Transient cerebral ischemic attack, unspecified: Secondary | ICD-10-CM | POA: Diagnosis not present

## 2017-06-09 DIAGNOSIS — E039 Hypothyroidism, unspecified: Secondary | ICD-10-CM | POA: Diagnosis not present

## 2017-06-09 DIAGNOSIS — J0101 Acute recurrent maxillary sinusitis: Secondary | ICD-10-CM | POA: Diagnosis not present

## 2017-06-09 DIAGNOSIS — I4891 Unspecified atrial fibrillation: Secondary | ICD-10-CM | POA: Diagnosis not present

## 2017-06-09 DIAGNOSIS — F419 Anxiety disorder, unspecified: Secondary | ICD-10-CM | POA: Diagnosis not present

## 2017-06-09 DIAGNOSIS — E785 Hyperlipidemia, unspecified: Secondary | ICD-10-CM | POA: Diagnosis not present

## 2017-06-12 ENCOUNTER — Telehealth: Payer: Self-pay | Admitting: Cardiology

## 2017-06-12 NOTE — Telephone Encounter (Signed)
Pt called and stated that she was trying to look at a report on her blu sync app but it would not allow her. Pt had some interference on her phone so I informed her that I was disconnecting the call and would call her back. Called pt back and informed her to call tech support for help trouble shooting her blu sync monitor. Pt verbalized understanding.

## 2017-06-15 ENCOUNTER — Ambulatory Visit (INDEPENDENT_AMBULATORY_CARE_PROVIDER_SITE_OTHER): Payer: Medicare Other | Admitting: *Deleted

## 2017-06-15 DIAGNOSIS — I480 Paroxysmal atrial fibrillation: Secondary | ICD-10-CM | POA: Diagnosis not present

## 2017-06-15 DIAGNOSIS — Z5181 Encounter for therapeutic drug level monitoring: Secondary | ICD-10-CM

## 2017-06-15 LAB — POCT INR: INR: 1.8

## 2017-06-15 NOTE — Patient Instructions (Signed)
Description   Today take 1.5 tablets, then Continue taking 1 tablet daily except 2 tablets on Sundays, Tuesdays, and Thursdays. Recheck in 3 weeks. Call Coumadin Clinic [phone]  Main [phone] with any questions or changes or any new medications

## 2017-07-08 ENCOUNTER — Ambulatory Visit (INDEPENDENT_AMBULATORY_CARE_PROVIDER_SITE_OTHER): Payer: Medicare Other | Admitting: *Deleted

## 2017-07-08 DIAGNOSIS — Z5181 Encounter for therapeutic drug level monitoring: Secondary | ICD-10-CM | POA: Diagnosis not present

## 2017-07-08 DIAGNOSIS — I480 Paroxysmal atrial fibrillation: Secondary | ICD-10-CM

## 2017-07-08 LAB — POCT INR: INR: 2.5

## 2017-07-08 NOTE — Patient Instructions (Signed)
Description   Continue taking 1 tablet daily except 2 tablets on Sundays, Tuesdays, and Thursdays. Recheck in 4 weeks. Call Coumadin Clinic [phone]  Main [phone] with any questions or changes or any new medications

## 2017-07-27 ENCOUNTER — Ambulatory Visit (INDEPENDENT_AMBULATORY_CARE_PROVIDER_SITE_OTHER): Payer: Medicare Other | Admitting: *Deleted

## 2017-07-27 ENCOUNTER — Other Ambulatory Visit: Payer: Self-pay | Admitting: Interventional Cardiology

## 2017-07-27 DIAGNOSIS — I495 Sick sinus syndrome: Secondary | ICD-10-CM

## 2017-07-27 NOTE — Progress Notes (Signed)
Remote pacemaker transmission.   

## 2017-07-28 ENCOUNTER — Encounter: Payer: Self-pay | Admitting: Cardiology

## 2017-07-28 LAB — CUP PACEART REMOTE DEVICE CHECK
Battery Remaining Longevity: 157 mo
Battery Voltage: 3.11 V
Brady Statistic AP VP Percent: 1.32 %
Brady Statistic AP VS Percent: 93.84 %
Brady Statistic AS VS Percent: 4.83 %
Brady Statistic RV Percent Paced: 1.33 %
Implantable Lead Implant Date: 20180914
Implantable Lead Location: 753859
Implantable Lead Model: 5076
Implantable Pulse Generator Implant Date: 20180914
Lead Channel Impedance Value: 323 Ohm
Lead Channel Impedance Value: 437 Ohm
Lead Channel Impedance Value: 456 Ohm
Lead Channel Pacing Threshold Amplitude: 0.625 V
Lead Channel Pacing Threshold Amplitude: 0.75 V
Lead Channel Pacing Threshold Pulse Width: 0.4 ms
Lead Channel Sensing Intrinsic Amplitude: 13.75 mV
Lead Channel Sensing Intrinsic Amplitude: 3 mV
Lead Channel Setting Pacing Amplitude: 2.5 V
MDC IDC LEAD IMPLANT DT: 20180914
MDC IDC LEAD LOCATION: 753860
MDC IDC MSMT LEADCHNL RA PACING THRESHOLD PULSEWIDTH: 0.4 ms
MDC IDC MSMT LEADCHNL RA SENSING INTR AMPL: 3 mV
MDC IDC MSMT LEADCHNL RV IMPEDANCE VALUE: 551 Ohm
MDC IDC MSMT LEADCHNL RV SENSING INTR AMPL: 13.75 mV
MDC IDC SESS DTM: 20190422033355
MDC IDC SET LEADCHNL RA PACING AMPLITUDE: 1.5 V
MDC IDC SET LEADCHNL RV PACING PULSEWIDTH: 0.4 ms
MDC IDC SET LEADCHNL RV SENSING SENSITIVITY: 0.9 mV
MDC IDC STAT BRADY AS VP PERCENT: 0.01 %
MDC IDC STAT BRADY RA PERCENT PACED: 95.77 %

## 2017-07-30 ENCOUNTER — Emergency Department (HOSPITAL_COMMUNITY)
Admission: EM | Admit: 2017-07-30 | Discharge: 2017-07-30 | Disposition: A | Discharge status: Home / Self Care | Payer: Medicare Other | Attending: Emergency Medicine | Admitting: Emergency Medicine

## 2017-07-30 ENCOUNTER — Encounter (HOSPITAL_COMMUNITY): Payer: Self-pay | Admitting: Emergency Medicine

## 2017-07-30 ENCOUNTER — Emergency Department (HOSPITAL_COMMUNITY): Payer: Medicare Other

## 2017-07-30 DIAGNOSIS — Y92017 Garden or yard in single-family (private) house as the place of occurrence of the external cause: Secondary | ICD-10-CM | POA: Insufficient documentation

## 2017-07-30 DIAGNOSIS — Y93H2 Activity, gardening and landscaping: Secondary | ICD-10-CM | POA: Diagnosis not present

## 2017-07-30 DIAGNOSIS — Z7901 Long term (current) use of anticoagulants: Secondary | ICD-10-CM | POA: Diagnosis not present

## 2017-07-30 DIAGNOSIS — Y998 Other external cause status: Secondary | ICD-10-CM | POA: Diagnosis not present

## 2017-07-30 DIAGNOSIS — Z91040 Latex allergy status: Secondary | ICD-10-CM | POA: Diagnosis not present

## 2017-07-30 DIAGNOSIS — Z950 Presence of cardiac pacemaker: Secondary | ICD-10-CM | POA: Insufficient documentation

## 2017-07-30 DIAGNOSIS — W010XXA Fall on same level from slipping, tripping and stumbling without subsequent striking against object, initial encounter: Secondary | ICD-10-CM | POA: Diagnosis not present

## 2017-07-30 DIAGNOSIS — I10 Essential (primary) hypertension: Secondary | ICD-10-CM | POA: Diagnosis not present

## 2017-07-30 DIAGNOSIS — S82455A Nondisplaced comminuted fracture of shaft of left fibula, initial encounter for closed fracture: Secondary | ICD-10-CM | POA: Diagnosis not present

## 2017-07-30 DIAGNOSIS — Z79899 Other long term (current) drug therapy: Secondary | ICD-10-CM | POA: Insufficient documentation

## 2017-07-30 DIAGNOSIS — S82452A Displaced comminuted fracture of shaft of left fibula, initial encounter for closed fracture: Secondary | ICD-10-CM | POA: Diagnosis not present

## 2017-07-30 DIAGNOSIS — S99912A Unspecified injury of left ankle, initial encounter: Secondary | ICD-10-CM | POA: Diagnosis present

## 2017-07-30 DIAGNOSIS — Z8673 Personal history of transient ischemic attack (TIA), and cerebral infarction without residual deficits: Secondary | ICD-10-CM | POA: Diagnosis not present

## 2017-07-30 DIAGNOSIS — S82442A Displaced spiral fracture of shaft of left fibula, initial encounter for closed fracture: Secondary | ICD-10-CM | POA: Diagnosis not present

## 2017-07-30 DIAGNOSIS — S82832A Other fracture of upper and lower end of left fibula, initial encounter for closed fracture: Secondary | ICD-10-CM | POA: Diagnosis not present

## 2017-07-30 LAB — BASIC METABOLIC PANEL
ANION GAP: 10 (ref 5–15)
BUN: 15 mg/dL (ref 6–20)
CO2: 24 mmol/L (ref 22–32)
Calcium: 9.6 mg/dL (ref 8.9–10.3)
Chloride: 106 mmol/L (ref 101–111)
Creatinine, Ser: 0.96 mg/dL (ref 0.44–1.00)
GFR calc Af Amer: >60 mL/min (ref >60)
GFR, EST NON AFRICAN AMERICAN: 58 mL/min — AB (ref >60)
Glucose, Bld: 108 mg/dL — ABNORMAL HIGH (ref 65–99)
POTASSIUM: 5.2 mmol/L — AB (ref 3.5–5.1)
SODIUM: 140 mmol/L (ref 135–145)

## 2017-07-30 LAB — CBC WITH DIFFERENTIAL/PLATELET
BASOS ABS: 0 10*3/uL (ref 0.0–0.1)
BASOS PCT: 0 %
EOS ABS: 0.1 10*3/uL (ref 0.0–0.7)
EOS PCT: 1 %
HCT: 38.7 % (ref 36.0–46.0)
Hemoglobin: 12.3 g/dL (ref 12.0–15.0)
Lymphocytes Relative: 17 %
Lymphs Abs: 1.5 10*3/uL (ref 0.7–4.0)
MCH: 26.7 pg (ref 26.0–34.0)
MCHC: 31.8 g/dL (ref 30.0–36.0)
MCV: 83.9 fL (ref 78.0–100.0)
MONO ABS: 0.7 10*3/uL (ref 0.1–1.0)
Monocytes Relative: 8 %
Neutro Abs: 6.5 10*3/uL (ref 1.7–7.7)
Neutrophils Relative %: 74 %
PLATELETS: 246 10*3/uL (ref 150–400)
RBC: 4.61 MIL/uL (ref 3.87–5.11)
RDW: 15 % (ref 11.5–15.5)
WBC: 8.8 10*3/uL (ref 4.0–10.5)

## 2017-07-30 LAB — PROTIME-INR
INR: 2.02
PROTHROMBIN TIME: 22.7 s — AB (ref 11.4–15.2)

## 2017-07-30 MED ORDER — FENTANYL CITRATE (PF) 100 MCG/2ML IJ SOLN
100.0000 ug | Freq: Once | INTRAMUSCULAR | Status: AC
  Administered 2017-07-30: 100 ug via INTRAMUSCULAR
  Filled 2017-07-30: qty 2

## 2017-07-30 MED ORDER — OXYCODONE-ACETAMINOPHEN 5-325 MG PO TABS: 1.0000 | ORAL_TABLET | Freq: Three times a day (TID) | ORAL | 0 refills | Status: DC | PRN

## 2017-07-30 MED ORDER — OXYCODONE-ACETAMINOPHEN 5-325 MG PO TABS
1.0000 | ORAL_TABLET | Freq: Once | ORAL | Status: AC
  Administered 2017-07-30: 1 via ORAL
  Filled 2017-07-30: qty 1

## 2017-07-30 NOTE — ED Provider Notes (Signed)
[facility] DEPT Provider Note   CSN: [id number] Arrival date & time: 07/30/17  1325     History   Chief Complaint Chief Complaint  Patient presents with  . Ankle Pain  . Fall    HPI [name] is a 72 y.o. female past medical history of A. Fib, hypertension, hypercholesteremia, sick sinus syndrome, who presents for evaluation of left ankle pain that began this afternoon after mechanical fall.  Patient reports that she was working in her garden and states that she misstepped, causing her to fall backwards.  Patient reports that when she did, her foot got caught on a rock, causing it to bend.  Patient states she did not hit her head or lose consciousness.  Patient reports that she is only having pain in the left leg that radiates up.  She states that she took a Xanax at home prior to ED arrival with minimal improvement in pain.  Patient states she is currently on blood thinners.  She is on Coumadin and states she has been compliant with her medications.  Patient states she did not lose consciousness, hit her head.  Patient denies any preceding chest pain, dizziness.  Patient denies any numbness/weakness.  The history is provided by the patient.    Past Medical History:  Diagnosis Date  . Acoustic neuroma (HCC)    Hx of right ear acoustic neuroma, removed in 1999. No hearing in right ear.  [name] Anxiety    Hx of, responds to Wellbutrin  . Basal cell carcinoma    s/p MOHS surgery in 03/2011  . Cataract   . Deafness in right ear   . Headache(784.0)    MIGRAINES  . Hearing loss    Only in right ear, from an acoustic neuroma. S/P removal in 1999.   [name] History of neck pain    Responds to Flexeril  . History of shingles   . Hypercholesteremia   . Hypertension   . PAF (paroxysmal atrial fibrillation) ([place])    2 brief episodes of Afib recorded on monitor 02/2007  . PONV (postoperative nausea and vomiting)   . Sinus bradycardia   . TIA (transient  ischemic attack) 2011   On coumadin. Asymptomatic, without recurrence.    Patient Active Problem List   Diagnosis Date Noted  . Sick sinus syndrome ([place]) 12/19/2016  . Chronic anticoagulation 11/24/2016  . Tachycardia-bradycardia syndrome ([place]) 11/24/2016  . Embolic stroke ([place]) 26/71/2458  . Hypersomnolence 07/05/2014  . Chronic cholecystitis with calculus 05/13/2013  . Encounter for therapeutic drug monitoring 05/09/2013  . Paroxysmal atrial fibrillation (HCC)   . Essential hypertension 12/05/2009  . HYPERCHOLESTEROLEMIA 10/23/2009  . ANXIETY STATE, UNSPECIFIED 10/23/2009  . MENOPAUSAL DISORDER 10/23/2009    Past Surgical History:  Procedure Laterality Date  . BREAST BIOPSY     X 3  . BREAST CYST ASPIRATION Left 50 yrs ago per pt  . BREAST EXCISIONAL BIOPSY Left 50 yrs ago  . CHOLECYSTECTOMY N/A 05/26/2013   Procedure: LAPAROSCOPIC CHOLECYSTECTOMY WITH INTRAOPERATIVE CHOLANGIOGRAM;  Surgeon: Imogene Burn. Georgette Dover, MD;  Location: [place] [facility];  Service: General;  Laterality: N/A;  . CRANIECTOMY FOR EXCISION OF ACOUSTIC NEUROMA Right 1999  . INTRAOCULAR LENS INSERTION     Dr. Talbert Forest  . MOHS SURGERY  03/2011   Basal cell skin cancer  . PACEMAKER IMPLANT N/A 12/19/2016   Medtronic Azure XT MRI conditional dual-chamber pacemaker for symptomatic sinus bradycardia by Dr Rayann Heman  . TONSILLECTOMY       OB  History   None      Home Medications    Prior to Admission medications   Medication Sig Start Date End Date Taking? Authorizing Provider  acetaminophen (TYLENOL) 500 MG tablet Take 1,000 mg by mouth every 6 (six) hours as needed for moderate pain.   Yes [provider]  clonazePAM (KLONOPIN) 0.5 MG tablet Take 0.25-0.5 mg by mouth every evening.  08/11/12  Yes [provider]  cyanocobalamin 1000 MCG tablet Take 1,000 mcg by mouth daily.    Yes [provider]  FLUoxetine (PROZAC) 10 MG capsule Take 10 mg by mouth daily with breakfast.  11/18/16  Yes [provider]  FLUoxetine (PROZAC) 20 MG capsule Take 20 mg by mouth daily with breakfast.    Yes [provider]  fluticasone (FLONASE) 50 MCG/ACT nasal spray Place 2 sprays into the nose daily as needed for allergies.    Yes [provider]  levothyroxine (SYNTHROID, LEVOTHROID) 88 MCG tablet Take 88 mcg by mouth daily before breakfast.   Yes [provider]  loratadine (CLARITIN) 10 MG tablet Take 10 mg by mouth daily as needed for allergies.   Yes [provider]  Omega-3 Fatty Acids (FISH OIL TRIPLE STRENGTH) 1400 MG CAPS Take 1,400 mg by mouth daily.    Yes [provider]  pantoprazole (PROTONIX) 40 MG tablet Take 40 mg by mouth daily as needed (acid reflux).    Yes [provider]  rosuvastatin (CRESTOR) 10 MG tablet Take 10 mg by mouth 2 (two) times a week. Tuesday & Thursday. 05/02/16  Yes [provider]  Vitamin D, Cholecalciferol, 400 UNITS CAPS Take 800 Units by mouth daily.   Yes [provider]  warfarin (COUMADIN) 2.5 MG tablet TAKE 1-2 TABLETS BY MOUTH DAILY AS DIRECTED BY COUMADIN CLINIC. 07/27/17  Yes Belva Crome, MD  flecainide (TAMBOCOR) 100 MG tablet Take 2 tablets (200 mg total) by mouth as needed. Take 2 tablets as needed 1 hour after you have taken Toprol if still in A-fib 12/10/16   Richardson Dopp T, PA-C  metoprolol succinate (TOPROL XL) 25 MG 24 hr tablet Take 0.5 tablets (12.5 mg total) by mouth daily as needed. For recurrent A-fib 12/10/16   Richardson Dopp T, PA-C  oxyCODONE-acetaminophen (PERCOCET/ROXICET) 5-325 MG tablet Take 1-2 tablets by mouth every 8 (eight) hours as needed for severe pain. 07/30/17   Volanda Napoleon, PA-C    Family History Family History  Problem Relation Age of Onset  . Alcoholism Father   . Liver cancer Father   . Diabetes Mother   . High Cholesterol Mother   . Hypertension Mother   . Memory loss Mother   . Healthy Sister   . Breast cancer Maternal Aunt   . Brain  cancer Maternal Aunt   . Cancer Maternal Grandmother        Unknown type  . Hypertension Other        paternal & maternal side with HTN, all lived into their 48s  . Heart disease Other        paternal & maternal side with heart disease, all lived into their 33s  . Hyperlipidemia Other        paternal & maternal side with hyperlipidemia, all lived into their 30s  . CVA Other        paternal & maternal side with strokes, all lived into their 60s    Social History Social History   Tobacco Use  . Smoking  status: Never Smoker  . Smokeless tobacco: Never Used  Substance Use Topics  . Alcohol use: Not on file    Comment: Rare  . Drug use: No     Allergies   Atorvastatin; Codeine; Latex; and Penicillins   Review of Systems Review of Systems  Constitutional: Negative for fever.  Respiratory: Negative for cough and shortness of breath.   Cardiovascular: Negative for chest pain.  Gastrointestinal: Negative for abdominal pain, nausea and vomiting.  Genitourinary: Negative for hematuria.  Musculoskeletal:       Left leg pain  Neurological: Negative for weakness, numbness and headaches.  All other systems reviewed and are negative.    Physical Exam Updated Vital Signs BP 135/81   Pulse 60   Temp 98.3 F (36.8 C) (Oral)   Resp 16   Ht 5\' 5"  (1.651 m)   Wt 65.8 kg (145 lb)   SpO2 99%   BMI 24.13 kg/m   Physical Exam  Constitutional: She is oriented to person, place, and time. She appears well-developed and well-nourished.  HENT:  Head: Normocephalic and atraumatic.  Mouth/Throat: Oropharynx is clear and moist and mucous membranes are normal.  Eyes: Pupils are equal, round, and reactive to light. Conjunctivae, EOM and lids are normal.  Neck: Full passive range of motion without pain.  Cardiovascular: Normal rate, regular rhythm, normal heart sounds and normal pulses. Exam reveals no gallop and no friction rub.  No murmur heard. Pulses:      Dorsalis pedis pulses are  2+ on the right side, and 2+ on the left side.  Pulmonary/Chest: Effort normal and breath sounds normal.  No evidence of respiratory distress. Able to speak in full sentences without difficulty.  Abdominal: Soft. Normal appearance. There is no tenderness. There is no rigidity and no guarding.  Musculoskeletal: Normal range of motion.  Tenderness palpation overlying the lateral aspect of distal left lower extremity with overlying soft tissue swelling and ecchymosis.  Limited range of motion of left lower extremity secondary to pain.  No tenderness palpation noted to anterior knee.  No abnormalities of the right lower extremity.  Neurological: She is alert and oriented to person, place, and time.  Sensation intact along major nerve distributions of BLE  Skin: Skin is warm and dry. Capillary refill takes less than 2 seconds.  Good distal cap refill. LLE is not dusky in appearance or cool to touch  Psychiatric: She has a normal mood and affect. Her speech is normal.  Nursing note and vitals reviewed.    ED Treatments / Results  Labs (all labs ordered are listed, but only abnormal results are displayed) Labs Reviewed  PROTIME-INR - Abnormal; Notable for the following components:      Result Value   Prothrombin Time 22.7 (*)    All other components within normal limits  BASIC METABOLIC PANEL - Abnormal; Notable for the following components:   Potassium 5.2 (*)    Glucose, Bld 108 (*)    GFR calc non Af Amer 58 (*)    All other components within normal limits  CBC WITH DIFFERENTIAL/PLATELET    EKG None  Radiology Dg Tibia/fibula Left  Result Date: 07/30/2017 CLINICAL DATA:  Left lower extremity pain after fall. EXAM: LEFT TIBIA AND FIBULA - 2 VIEW COMPARISON:  None. FINDINGS: Mildly displaced oblique fracture is seen involving the distal left fibula. The tibia appears normal. No soft tissue abnormality is noted. IMPRESSION: Mildly displaced distal left fibular fracture. Electronically  Signed   By: Jeneen Rinks  Murlean Caller, M.D.   On: 07/30/2017 18:26   Dg Ankle Complete Left  Result Date: 07/30/2017 CLINICAL DATA:  Pain following fall EXAM: LEFT ANKLE COMPLETE - 3+ VIEW COMPARISON:  None. FINDINGS: Frontal, oblique, and lateral views obtained. There is a comminuted spiral type fracture of the distal fibular diaphysis with a fracture fragment displaced medially. Other fracture fragments are in near anatomic alignment. There is diffuse swelling laterally. No other evident fracture. No appreciable joint effusion. Ankle mortise appears intact. No appreciable arthropathy. IMPRESSION: Comminuted spiral fracture distal fibular diaphysis with a displaced fracture fragment medially. Other fracture elements are in near anatomic alignment. Diffuse soft tissue swelling laterally. Ankle mortise appears intact. No other fracture evident. Electronically Signed   By: Lowella Grip III M.D.   On: 07/30/2017 14:47   Dg Foot Complete Left  Result Date: 07/30/2017 CLINICAL DATA:  Pain following fall with twisting injury EXAM: LEFT FOOT - COMPLETE 3+ VIEW COMPARISON:  Left ankle radiographs July 30, 2017 FINDINGS: Frontal, oblique, and lateral views obtained. Fracture of the distal fibula is described in the left ankle report. In the foot region, there is no appreciable fracture or dislocation. Joint spaces appear normal. No erosive change. IMPRESSION: Comminuted fracture distal fibula. In the foot region, no evident fracture or dislocation. No appreciable arthropathy. Electronically Signed   By: Lowella Grip III M.D.   On: 07/30/2017 14:48    Procedures Procedures (including critical care time)  Medications Ordered in ED Medications  fentaNYL (SUBLIMAZE) injection 100 mcg (100 mcg Intramuscular Given 07/30/17 2039)  oxyCODONE-acetaminophen (PERCOCET/ROXICET) 5-325 MG per tablet 1 tablet (1 tablet Oral Given 07/30/17 2215)     Initial Impression / Assessment and Plan / ED Course  I have reviewed  the triage vital signs and the nursing notes.  Pertinent labs & imaging results that were available during my care of the patient were reviewed by me and considered in my medical decision making (see chart for details).     72 y.o. F with PMH/o past medical of A. fib, hypertension, hypercholesteremia who presents for evaluation of left leg and ankle pain after mechanical fall that occurred earlier this afternoon.  Patient reports she is currently on blood thinners.  She states she did not hit her head, lose consciousness.  Has not been able to ambulate or bear weight since the incident. Patient is afebrile, non-toxic appearing, sitting comfortably on examination table. Vital signs reviewed and stable. Patient is neurovascularly intact.  On exam, patient has tenderness palpation noted to the lateral aspect of the left ankle and distal tib-fib.  There is overlying ecchymosis.  Compartments are soft.  Consider fracture versus dislocation versus sprain.  X-rays ordered at triage.  We will plan to check basic labs, PT/INR since patient is on Coumadin.  BMP shows potassium of 5.2, glucose is 108.  PT/INR shows an INR of 2.02.  CBC without any significant leukocytosis, anemia.  X-rays show a comminuted spiral fracture at the distal fibular diaphysis with a displaced fractures fragment medially.  Concerns, will plan to consult orthopedic  Discussed patient with Dr. Delfino Lovett (Ortho) after reviewing the films.  He recommends doing a posterior splint with a U.  He recommends making patient nonweightbearing with plans to follow-up in his office on an outpatient basis in approximately 1 week.  Discussed plan with patient.  She is agreeable.  Reevaluation after splint placement.  Patient able to move all digits without any difficulty.  Good distal cap refill.  We will plan  to give a short course of pain medication for pain.  Instructed to follow-up with orthopedics as directed. Patient had ample opportunity for  questions and discussion. All patient's questions were answered with full understanding. Strict return precautions discussed. Patient expresses understanding and agreement to plan.    Final Clinical Impressions(s) / ED Diagnoses   Final diagnoses:  Closed nondisplaced comminuted fracture of shaft of left fibula, initial encounter    ED Discharge Orders        Ordered    oxyCODONE-acetaminophen (PERCOCET/ROXICET) 5-325 MG tablet  Every 8 hours PRN     07/30/17 2158       Volanda Napoleon, PA-C 07/30/17 2230    Pixie Casino, MD 07/30/17 2253

## 2017-07-30 NOTE — ED Triage Notes (Signed)
Pt reports that she was at [facility] on rocks and twisted her ankle when she fell. Pt c/o left ankle pain and swelling. Is on Warfarin.

## 2017-07-30 NOTE — ED Notes (Signed)
ED Provider at bedside. 

## 2017-07-30 NOTE — ED Notes (Signed)
Offered Pt ice for her ankle. Pt politely declined.

## 2017-07-30 NOTE — Discharge Instructions (Signed)
You can take Tylenol 1000 mg 3 times a day for pain.  He can take the pain medication for severe breakthrough pain.  Do not exceed 4000 mg of Tylenol a day.  As we discussed, it is very important that you elevate the leg to help reduce swelling.  You should be nonweightbearing.  Use the crutches at all times and do not put any weight on the foot.  Follow-up with referred orthopedic doctor.  You have an appointment in 1 week.  Call their office and arrange for the time of the appointment.  Return the emergency department for any worsening pain, discoloration of the toes, numbness of the leg, fevers, chest pain, difficulty breathing or any other worsening or concerning symptoms.

## 2017-07-31 ENCOUNTER — Telehealth: Payer: Self-pay | Admitting: Internal Medicine

## 2017-07-31 ENCOUNTER — Ambulatory Visit (INDEPENDENT_AMBULATORY_CARE_PROVIDER_SITE_OTHER): Payer: Medicare Other | Admitting: Pharmacist

## 2017-07-31 DIAGNOSIS — Z5181 Encounter for therapeutic drug level monitoring: Secondary | ICD-10-CM

## 2017-07-31 DIAGNOSIS — I480 Paroxysmal atrial fibrillation: Secondary | ICD-10-CM

## 2017-07-31 NOTE — Telephone Encounter (Signed)
Spoke with pt and advised that percocet would be ok with warfarin. INR from ED dosed and follow up scheduled.

## 2017-07-31 NOTE — Telephone Encounter (Signed)
New Message  If Home Health RN is calling please get Coumadin Nurse on the phone STAT  1.  Are you calling in regards to an appointment? yes  2.  Are you calling for a refill ? no  3.  Are you having bleeding issues? no  4.  Do you need clearance to hold Coumadin? no  Pt states that she broke her foot and had her INR don at [facility] which was a 2.7, also wantst o make sure that its ok she take percocets with her warfarin. Please call Please route to the Coumadin Clinic Pool

## 2017-07-31 NOTE — Telephone Encounter (Signed)
LMOM to speak with patient.

## 2017-08-05 ENCOUNTER — Ambulatory Visit: Payer: Self-pay | Admitting: Orthopedic Surgery

## 2017-08-05 DIAGNOSIS — S82832A Other fracture of upper and lower end of left fibula, initial encounter for closed fracture: Secondary | ICD-10-CM | POA: Diagnosis not present

## 2017-08-06 ENCOUNTER — Other Ambulatory Visit: Payer: Self-pay

## 2017-08-06 ENCOUNTER — Ambulatory Visit (INDEPENDENT_AMBULATORY_CARE_PROVIDER_SITE_OTHER): Payer: Medicare Other | Admitting: *Deleted

## 2017-08-06 ENCOUNTER — Telehealth: Payer: Self-pay

## 2017-08-06 ENCOUNTER — Encounter (HOSPITAL_COMMUNITY): Payer: Self-pay | Admitting: *Deleted

## 2017-08-06 DIAGNOSIS — Z5181 Encounter for therapeutic drug level monitoring: Secondary | ICD-10-CM

## 2017-08-06 DIAGNOSIS — I480 Paroxysmal atrial fibrillation: Secondary | ICD-10-CM

## 2017-08-06 LAB — POCT INR: INR: 2.7

## 2017-08-06 MED ORDER — ENOXAPARIN SODIUM 100 MG/ML ~~LOC~~ SOLN: 100.0000 mg | SUBCUTANEOUS | 1 refills | Status: DC

## 2017-08-06 NOTE — Patient Instructions (Addendum)
08/05/2017: Last dose of Coumadin.  08/06/2017: No Coumadin or Lovenox.  08/07/2017: Inject Lovenox 100mg  in the fatty abdominal tissue at least 2 inches from the belly button once daily at 8am  rotate sites. No Coumadin.  08/08/2017: Inject Lovenox in the fatty tissue daily at 8am . No Coumadin.  08/09/2017 : Inject Lovenox in the fatty tissue daily at 8am No Coumadin.  :.  08/10/2017 : Procedure Day - No Lovenox - Resume Coumadin as instructed by physician in the hospital   08/11/2017 : Resume Lovenox inject in the fatty tissue daily at 8am  and take Coumadin.  08/12/2017 : Inject Lovenox in the fatty tissue daily at 8am and take Coumadin.  08/13/2017 : Inject Lovenox in the fatty tissue daily at 8am  and take Coumadin.  08/14/2017 : Inject Lovenox in the fatty tissue daily at 8am and take Coumadin.  08/15/2017 : Inject Lovenox in the fatty tissue daily at 8am and take Coumadin.  08/16/2017 : Inject lovenox in the fatty tissue daily at 8am and take coumadin  08/17/2017  Coumdin appt  Description   Pt did not take coumadin yesterday and has held coumadin today no more coumadin until after surgery See Pt instruction sheet. Recheck in 1 week after surgery. Call Coumadin Clinic [phone]  Main [phone] with any questions or changes or any new medications When resume coumadin resume at same dose 2.5mg  daily except 5mg  on Sundays Tuesdays and Thursdays

## 2017-08-06 NOTE — Addendum Note (Signed)
Addended by: Margretta Sidle on: 08/06/2017 03:24 PM   Modules accepted: Orders

## 2017-08-06 NOTE — Progress Notes (Signed)
Anesthesia Chart Review:  Pt is a same day work up   Case:  [id number] Date/Time:  08/10/17 1400   Procedure:  OPEN REDUCTION INTERNAL FIXATION (ORIF) ANKLE FRACTURE (Left Ankle)   Anesthesia type:  General   Pre-op diagnosis:  Left ankle fracture   Location:  [facility] OR ROOM 08 / [facility]   Surgeon:  Rod Can, MD      DISCUSSION:  - Pt is a 72 year old female with hx afib, symptomatic sinus bradycardia, pacemaker (implanted 12/19/16). Pt to hold coumadin for 5 days and bridge with lovenox.  - Pt has cardiac clearance for surgery   PROVIDERS: Aura Dials, MD  Cardiologist is Daneen Schick, MD. Last office visit 12/25/16 EP cardiologist is Thompson Grayer, MD. Last office visit 03/23/17. Pt cleared for surgery by Christell Faith, PA on 08/06/17  LABS: Will be obtained day of surgery   IMAGES:  CXR 12/19/16: No acute findings after pacemaker placement.   EKG 03/23/17: Atrial-paced rhythm with prolonged AV conduction.  Nonspecific ST and T wave abnormality.   CV:  Nuclear stress test 11/10/16:   Clinically and electrically negative for ischemia  Normal perfusion. No ischemia or scar  This is a low risk study.  LVEF is 62%  Cardiac event monitor 12/06/16:   Predominately sinus rhythm and bradycardia with slowest rates during sleep  Atrial fibrillation with RVR  Echo 12/02/16:  - Left ventricle: The cavity size was normal. Wall thickness was normal. Systolic function was normal. The estimated ejection fraction was in the range of 55% to 60%. Wall motion was normal; there were no regional wall motion abnormalities. Features are consistent with a pseudonormal left ventricular filling pattern, with concomitant abnormal relaxation and increased filling pressure (grade 2 diastolic dysfunction). - Aortic valve: There was no stenosis. - Mitral valve: Mildly calcified annulus. There was mild regurgitation. - Left atrium: The atrium was mildly to moderately dilated. - Right ventricle: The cavity  size was normal. Systolic function was normal. - Right atrium: The atrium was mildly dilated. - Tricuspid valve: Peak RV-RA gradient (S): 27 mm Hg. - Pulmonary arteries: PA peak pressure: 30 mm Hg (S). - Inferior vena cava: The vessel was normal in size. The respirophasic diameter changes were in the normal range (>= 50%), consistent with normal central venous pressure. - Impressions: Normal LV size with EF 55-60%. Moderate diastolic dysfunction. Normal RV size and systolic function. Mild mitral regurgitation.  Carotid duplex 08/26/2012:  - Negative for hemodynamically significant stenosis involving extracranial carotid arteries bilaterally    Past Medical History:  Diagnosis Date  . Acoustic neuroma (HCC)    Hx of right ear acoustic neuroma, removed in 1999. No hearing in right ear.  [name] Anxiety    Hx of, responds to Wellbutrin  . Basal cell carcinoma    s/p MOHS surgery in 03/2011  . Cataract   . Deafness in right ear   . Headache(784.0)    MIGRAINES  . Hearing loss    Only in right ear, from an acoustic neuroma. S/P removal in 1999.   [name] History of neck pain    Responds to Flexeril  . History of shingles   . Hypercholesteremia   . Hypertension   . PAF (paroxysmal atrial fibrillation) ([place])    2 brief episodes of Afib recorded on monitor 02/2007  . PONV (postoperative nausea and vomiting)   . Sinus bradycardia   . TIA (transient ischemic attack) 2011   On coumadin. Asymptomatic, without recurrence.    Past  Surgical History:  Procedure Laterality Date  . BREAST BIOPSY     X 3  . BREAST CYST ASPIRATION Left 50 yrs ago per pt  . BREAST EXCISIONAL BIOPSY Left 50 yrs ago  . CHOLECYSTECTOMY N/A 05/26/2013   Procedure: LAPAROSCOPIC CHOLECYSTECTOMY WITH INTRAOPERATIVE CHOLANGIOGRAM;  Surgeon: Imogene Burn. Georgette Dover, MD;  Location: [place];  Service: General;  Laterality: N/A;  . CRANIECTOMY FOR EXCISION OF ACOUSTIC NEUROMA Right 1999  . INTRAOCULAR LENS INSERTION     Dr. Talbert Forest  . MOHS  SURGERY  03/2011   Basal cell skin cancer  . PACEMAKER IMPLANT N/A 12/19/2016   Medtronic Azure XT MRI conditional dual-chamber pacemaker for symptomatic sinus bradycardia by Dr Rayann Heman  . TONSILLECTOMY      MEDICATIONS: No current facility-administered medications for this encounter.    [name] acetaminophen (TYLENOL) 500 MG tablet  . clonazePAM (KLONOPIN) 0.5 MG tablet  . cyanocobalamin 1000 MCG tablet  . flecainide (TAMBOCOR) 100 MG tablet  . FLUoxetine (PROZAC) 10 MG capsule  . FLUoxetine (PROZAC) 20 MG capsule  . ketotifen (ALAWAY) 0.025 % ophthalmic solution  . levothyroxine (SYNTHROID, LEVOTHROID) 88 MCG tablet  . loratadine (CLARITIN) 10 MG tablet  . metoprolol succinate (TOPROL XL) 25 MG 24 hr tablet  . Omega-3 Fatty Acids (FISH OIL TRIPLE STRENGTH) 1400 MG CAPS  . pantoprazole (PROTONIX) 40 MG tablet  . rosuvastatin (CRESTOR) 10 MG tablet  . Vitamin D, Cholecalciferol, 400 UNITS CAPS  . warfarin (COUMADIN) 2.5 MG tablet  . enoxaparin (LOVENOX) 100 MG/ML injection  . oxyCODONE-acetaminophen (PERCOCET/ROXICET) 5-325 MG tablet   Pt to hold coumadin 5 days before surgery and bridge with lovenox.    If labs acceptable day of surgery, I anticipate pt can proceed with surgery as scheduled.  Willeen Cass, FNP-BC [facility]/Anesthesiology Phone: [phone] 08/06/2017 4:01 PM

## 2017-08-06 NOTE — Telephone Encounter (Signed)
   [facility] Pre-operative Risk Assessment    Request for surgical clearance:  1. What type of surgery is being performed? ORIF L ankle  2. When is this surgery scheduled? 08/10/17   3. What type of clearance is required (medical clearance vs. Pharmacy clearance to hold med vs. Both)? Both  4. Are there any medications that need to be held prior to surgery and how long? Coumadin   5. Practice name and name of physician performing surgery? [facility]    6. What is your office phone number [phone]    7.   What is your office fax number [phone] Jeronimo Norma  8.   Anesthesia type (None, local, MAC, general) ? General and  regional block   Meryl Crutch 08/06/2017, 7:39 AM  _________________________________________________________________   (provider comments below)

## 2017-08-06 NOTE — Telephone Encounter (Signed)
Routed to pharmacy for review of Coumadin.

## 2017-08-06 NOTE — Progress Notes (Signed)
Pt denies any acute cardiopulmonary issues. Pt under the care of Dr. Rayann Heman, EP and Dr. Daneen Schick, Cardiology. Pt made aware to stop taking vitamins, fish oil and herbal medications. Do not take any NSAIDs ie: Ibuprofen, Advil, Naproxen (ALeve), Motrin, BC and Goody Powder. Last dose of Coumadin was Tuesday (bridged with Lovenox starting Friday). Pt verbalized understanding of all pre-op instructions. See anesthesia note.

## 2017-08-06 NOTE — Telephone Encounter (Signed)
   Primary Cardiologist: [facility]  Chart reviewed as part of pre-operative protocol coverage. Patient was contacted 08/06/2017 in reference to pre-operative risk assessment for pending surgery as outlined below.  [name] was last seen on 03/23/2017 by Dr. Rayann Heman.  Since that day, [name] has done well and has been without chest pain, SOB, or palpitations. She is able to achieve > 4 METs. She has been contacted by our pharmacist for Lovenox bridging, scheduled for this on the afternoon of 5/2. She has not taken her Coumadin on the evening of 5/1 or this morning.   Therefore, based on ACC/AHA guidelines, the patient would be at acceptable risk for the planned procedure without further cardiovascular testing.   I will route this recommendation to the requesting party via Epic fax function and remove from pre-op pool.  Please call with questions.  Christell Faith, PA-C 08/06/2017, 2:14 PM

## 2017-08-06 NOTE — Telephone Encounter (Signed)
Pt takes warfarin for afib with CHADS2VASc score of 5 (HTN, age, sex, embolic CVA). Typically would bridge with Lovenox and hold Coumadin for 5 days prior to procedure.  Called Dr Noralyn Pick office since we received this request late and pt can only hold warfarin for 4 days maximum assuming pt has not taken her Coumadin today. Called pt - she states did not take coumadin last night or today. Scheduled pt to come in this afternoon for a Lovenox bridge.

## 2017-08-07 ENCOUNTER — Telehealth: Payer: Self-pay | Admitting: Pharmacist

## 2017-08-07 NOTE — Telephone Encounter (Signed)
Pt called clinic and stated surgery for Monday 5/6 has been moved up from 2pm to 11am. Advised pt to take last Lovenox shot at 6am rather than 8am on Sunday morning 5/5 to allow for more adequate washout prior to procedure. She verbalized understanding.

## 2017-08-10 ENCOUNTER — Observation Stay (HOSPITAL_COMMUNITY)
Admission: RE | Admit: 2017-08-10 | Discharge: 2017-08-11 | Disposition: A | Discharge status: Home / Self Care | Payer: Medicare Other | Source: Ambulatory Visit | Attending: Orthopedic Surgery | Admitting: Orthopedic Surgery

## 2017-08-10 ENCOUNTER — Ambulatory Visit (HOSPITAL_COMMUNITY): Payer: Medicare Other | Admitting: Emergency Medicine

## 2017-08-10 ENCOUNTER — Ambulatory Visit (HOSPITAL_COMMUNITY): Payer: Medicare Other

## 2017-08-10 ENCOUNTER — Encounter (HOSPITAL_COMMUNITY)
Admission: RE | Disposition: A | Discharge status: Home / Self Care | Payer: Self-pay | Source: Ambulatory Visit | Attending: Orthopedic Surgery

## 2017-08-10 DIAGNOSIS — I480 Paroxysmal atrial fibrillation: Secondary | ICD-10-CM | POA: Diagnosis not present

## 2017-08-10 DIAGNOSIS — K449 Diaphragmatic hernia without obstruction or gangrene: Secondary | ICD-10-CM | POA: Insufficient documentation

## 2017-08-10 DIAGNOSIS — S8292XD Unspecified fracture of left lower leg, subsequent encounter for closed fracture with routine healing: Secondary | ICD-10-CM | POA: Diagnosis not present

## 2017-08-10 DIAGNOSIS — R001 Bradycardia, unspecified: Secondary | ICD-10-CM | POA: Insufficient documentation

## 2017-08-10 DIAGNOSIS — Z950 Presence of cardiac pacemaker: Secondary | ICD-10-CM | POA: Diagnosis not present

## 2017-08-10 DIAGNOSIS — Z820 Family history of epilepsy and other diseases of the nervous system: Secondary | ICD-10-CM | POA: Insufficient documentation

## 2017-08-10 DIAGNOSIS — Z91040 Latex allergy status: Secondary | ICD-10-CM | POA: Insufficient documentation

## 2017-08-10 DIAGNOSIS — S93432A Sprain of tibiofibular ligament of left ankle, initial encounter: Secondary | ICD-10-CM | POA: Diagnosis not present

## 2017-08-10 DIAGNOSIS — F419 Anxiety disorder, unspecified: Secondary | ICD-10-CM | POA: Insufficient documentation

## 2017-08-10 DIAGNOSIS — Z803 Family history of malignant neoplasm of breast: Secondary | ICD-10-CM | POA: Diagnosis not present

## 2017-08-10 DIAGNOSIS — Z8673 Personal history of transient ischemic attack (TIA), and cerebral infarction without residual deficits: Secondary | ICD-10-CM | POA: Insufficient documentation

## 2017-08-10 DIAGNOSIS — S9305XA Dislocation of left ankle joint, initial encounter: Secondary | ICD-10-CM | POA: Diagnosis not present

## 2017-08-10 DIAGNOSIS — G8918 Other acute postprocedural pain: Secondary | ICD-10-CM | POA: Diagnosis not present

## 2017-08-10 DIAGNOSIS — S82832A Other fracture of upper and lower end of left fibula, initial encounter for closed fracture: Secondary | ICD-10-CM | POA: Insufficient documentation

## 2017-08-10 DIAGNOSIS — K219 Gastro-esophageal reflux disease without esophagitis: Secondary | ICD-10-CM | POA: Diagnosis not present

## 2017-08-10 DIAGNOSIS — Z90710 Acquired absence of both cervix and uterus: Secondary | ICD-10-CM | POA: Diagnosis not present

## 2017-08-10 DIAGNOSIS — Z7901 Long term (current) use of anticoagulants: Secondary | ICD-10-CM | POA: Insufficient documentation

## 2017-08-10 DIAGNOSIS — M542 Cervicalgia: Secondary | ICD-10-CM | POA: Insufficient documentation

## 2017-08-10 DIAGNOSIS — E7800 Pure hypercholesterolemia, unspecified: Secondary | ICD-10-CM | POA: Diagnosis not present

## 2017-08-10 DIAGNOSIS — I2510 Atherosclerotic heart disease of native coronary artery without angina pectoris: Secondary | ICD-10-CM | POA: Insufficient documentation

## 2017-08-10 DIAGNOSIS — I509 Heart failure, unspecified: Secondary | ICD-10-CM | POA: Insufficient documentation

## 2017-08-10 DIAGNOSIS — Z79899 Other long term (current) drug therapy: Secondary | ICD-10-CM | POA: Insufficient documentation

## 2017-08-10 DIAGNOSIS — G43909 Migraine, unspecified, not intractable, without status migrainosus: Secondary | ICD-10-CM | POA: Insufficient documentation

## 2017-08-10 DIAGNOSIS — Z8249 Family history of ischemic heart disease and other diseases of the circulatory system: Secondary | ICD-10-CM | POA: Insufficient documentation

## 2017-08-10 DIAGNOSIS — Z888 Allergy status to other drugs, medicaments and biological substances status: Secondary | ICD-10-CM | POA: Insufficient documentation

## 2017-08-10 DIAGNOSIS — E039 Hypothyroidism, unspecified: Secondary | ICD-10-CM | POA: Diagnosis not present

## 2017-08-10 DIAGNOSIS — Z419 Encounter for procedure for purposes other than remedying health state, unspecified: Secondary | ICD-10-CM

## 2017-08-10 DIAGNOSIS — S8262XA Displaced fracture of lateral malleolus of left fibula, initial encounter for closed fracture: Principal | ICD-10-CM | POA: Insufficient documentation

## 2017-08-10 DIAGNOSIS — Z885 Allergy status to narcotic agent status: Secondary | ICD-10-CM | POA: Insufficient documentation

## 2017-08-10 DIAGNOSIS — H9192 Unspecified hearing loss, left ear: Secondary | ICD-10-CM | POA: Insufficient documentation

## 2017-08-10 DIAGNOSIS — I110 Hypertensive heart disease with heart failure: Secondary | ICD-10-CM | POA: Insufficient documentation

## 2017-08-10 DIAGNOSIS — W19XXXA Unspecified fall, initial encounter: Secondary | ICD-10-CM | POA: Insufficient documentation

## 2017-08-10 DIAGNOSIS — Z8619 Personal history of other infectious and parasitic diseases: Secondary | ICD-10-CM | POA: Insufficient documentation

## 2017-08-10 DIAGNOSIS — Z800 Family history of malignant neoplasm of digestive organs: Secondary | ICD-10-CM | POA: Insufficient documentation

## 2017-08-10 DIAGNOSIS — S82402A Unspecified fracture of shaft of left fibula, initial encounter for closed fracture: Secondary | ICD-10-CM | POA: Diagnosis not present

## 2017-08-10 DIAGNOSIS — S8261XA Displaced fracture of lateral malleolus of right fibula, initial encounter for closed fracture: Secondary | ICD-10-CM | POA: Diagnosis not present

## 2017-08-10 DIAGNOSIS — Z9889 Other specified postprocedural states: Secondary | ICD-10-CM | POA: Insufficient documentation

## 2017-08-10 DIAGNOSIS — J449 Chronic obstructive pulmonary disease, unspecified: Secondary | ICD-10-CM | POA: Diagnosis not present

## 2017-08-10 DIAGNOSIS — Z09 Encounter for follow-up examination after completed treatment for conditions other than malignant neoplasm: Secondary | ICD-10-CM

## 2017-08-10 DIAGNOSIS — Z880 Allergy status to penicillin: Secondary | ICD-10-CM | POA: Insufficient documentation

## 2017-08-10 DIAGNOSIS — Z85828 Personal history of other malignant neoplasm of skin: Secondary | ICD-10-CM | POA: Diagnosis not present

## 2017-08-10 DIAGNOSIS — Z811 Family history of alcohol abuse and dependence: Secondary | ICD-10-CM | POA: Diagnosis not present

## 2017-08-10 DIAGNOSIS — Z808 Family history of malignant neoplasm of other organs or systems: Secondary | ICD-10-CM | POA: Insufficient documentation

## 2017-08-10 DIAGNOSIS — Z9049 Acquired absence of other specified parts of digestive tract: Secondary | ICD-10-CM | POA: Insufficient documentation

## 2017-08-10 DIAGNOSIS — S8292XA Unspecified fracture of left lower leg, initial encounter for closed fracture: Secondary | ICD-10-CM | POA: Diagnosis not present

## 2017-08-10 DIAGNOSIS — Z823 Family history of stroke: Secondary | ICD-10-CM | POA: Insufficient documentation

## 2017-08-10 HISTORY — DX: Hypothyroidism, unspecified: E03.9

## 2017-08-10 HISTORY — DX: Family history of other specified conditions: Z84.89

## 2017-08-10 HISTORY — DX: Presence of cardiac pacemaker: Z95.0

## 2017-08-10 HISTORY — DX: Gastro-esophageal reflux disease without esophagitis: K21.9

## 2017-08-10 HISTORY — DX: Other injury of unspecified body region, initial encounter: T14.8XXA

## 2017-08-10 HISTORY — PX: ORIF ANKLE FRACTURE: SHX5408

## 2017-08-10 HISTORY — DX: Chronic obstructive pulmonary disease, unspecified: J44.9

## 2017-08-10 HISTORY — DX: Displaced fracture of lateral malleolus of left fibula, initial encounter for closed fracture: S82.62XA

## 2017-08-10 HISTORY — DX: Personal history of other diseases of the digestive system: Z87.19

## 2017-08-10 LAB — CBC
HEMATOCRIT: 37.2 % (ref 36.0–46.0)
HEMOGLOBIN: 11.7 g/dL — AB (ref 12.0–15.0)
MCH: 26.2 pg (ref 26.0–34.0)
MCHC: 31.5 g/dL (ref 30.0–36.0)
MCV: 83.4 fL (ref 78.0–100.0)
Platelets: 275 10*3/uL (ref 150–400)
RBC: 4.46 MIL/uL (ref 3.87–5.11)
RDW: 15.1 % (ref 11.5–15.5)
WBC: 8.7 10*3/uL (ref 4.0–10.5)

## 2017-08-10 LAB — CREATININE, SERUM
Creatinine, Ser: 0.94 mg/dL (ref 0.44–1.00)
GFR calc Af Amer: >60 mL/min (ref >60)
GFR, EST NON AFRICAN AMERICAN: 59 mL/min — AB (ref >60)

## 2017-08-10 LAB — PROTIME-INR
INR: 1.09
PROTHROMBIN TIME: 14 s (ref 11.4–15.2)

## 2017-08-10 SURGERY — OPEN REDUCTION INTERNAL FIXATION (ORIF) ANKLE FRACTURE
Anesthesia: General | Site: Ankle | Laterality: Left

## 2017-08-10 MED ORDER — FENTANYL CITRATE (PF) 250 MCG/5ML IJ SOLN
INTRAMUSCULAR | Status: DC | PRN
  Administered 2017-08-10 (×2): 25 ug via INTRAVENOUS

## 2017-08-10 MED ORDER — CLINDAMYCIN PHOSPHATE 900 MG/50ML IV SOLN
INTRAVENOUS | Status: AC
  Filled 2017-08-10: qty 50

## 2017-08-10 MED ORDER — ONDANSETRON HCL 4 MG/2ML IJ SOLN: 4.0000 mg | Freq: Four times a day (QID) | INTRAMUSCULAR | Status: DC | PRN

## 2017-08-10 MED ORDER — CLINDAMYCIN PHOSPHATE 600 MG/50ML IV SOLN
600.0000 mg | Freq: Four times a day (QID) | INTRAVENOUS | Status: AC
  Administered 2017-08-10 – 2017-08-11 (×3): 600 mg via INTRAVENOUS
  Filled 2017-08-10 (×3): qty 50

## 2017-08-10 MED ORDER — PROMETHAZINE HCL 25 MG/ML IJ SOLN: 6.2500 mg | INTRAMUSCULAR | Status: DC | PRN

## 2017-08-10 MED ORDER — FENTANYL CITRATE (PF) 100 MCG/2ML IJ SOLN: 25.0000 ug | INTRAMUSCULAR | Status: DC | PRN

## 2017-08-10 MED ORDER — PROPOFOL 10 MG/ML IV BOLUS
INTRAVENOUS | Status: AC
  Filled 2017-08-10: qty 20

## 2017-08-10 MED ORDER — LIDOCAINE 2% (20 MG/ML) 5 ML SYRINGE
INTRAMUSCULAR | Status: DC | PRN
  Administered 2017-08-10: 60 mg via INTRAVENOUS

## 2017-08-10 MED ORDER — PROPOFOL 500 MG/50ML IV EMUL
INTRAVENOUS | Status: DC | PRN
  Administered 2017-08-10: 150 ug/kg/min via INTRAVENOUS

## 2017-08-10 MED ORDER — EPHEDRINE 5 MG/ML INJ
INTRAVENOUS | Status: AC
  Filled 2017-08-10: qty 10

## 2017-08-10 MED ORDER — LIDOCAINE 2% (20 MG/ML) 5 ML SYRINGE
INTRAMUSCULAR | Status: AC
  Filled 2017-08-10: qty 5

## 2017-08-10 MED ORDER — DEXAMETHASONE SODIUM PHOSPHATE 10 MG/ML IJ SOLN
INTRAMUSCULAR | Status: DC | PRN
  Administered 2017-08-10: 10 mg via INTRAVENOUS

## 2017-08-10 MED ORDER — FENTANYL CITRATE (PF) 100 MCG/2ML IJ SOLN
INTRAMUSCULAR | Status: AC
  Administered 2017-08-10: 50 ug
  Filled 2017-08-10: qty 2

## 2017-08-10 MED ORDER — ACETAMINOPHEN 325 MG PO TABS
325.0000 mg | ORAL_TABLET | Freq: Four times a day (QID) | ORAL | Status: DC | PRN
  Administered 2017-08-10: 325 mg via ORAL
  Filled 2017-08-10: qty 1

## 2017-08-10 MED ORDER — PHENYLEPHRINE 40 MCG/ML (10ML) SYRINGE FOR IV PUSH (FOR BLOOD PRESSURE SUPPORT)
PREFILLED_SYRINGE | INTRAVENOUS | Status: AC
  Filled 2017-08-10: qty 20

## 2017-08-10 MED ORDER — PANTOPRAZOLE SODIUM 40 MG PO TBEC: 40.0000 mg | DELAYED_RELEASE_TABLET | Freq: Every day | ORAL | Status: DC | PRN

## 2017-08-10 MED ORDER — PHENYLEPHRINE 40 MCG/ML (10ML) SYRINGE FOR IV PUSH (FOR BLOOD PRESSURE SUPPORT)
PREFILLED_SYRINGE | INTRAVENOUS | Status: DC | PRN
  Administered 2017-08-10 (×2): 40 ug via INTRAVENOUS

## 2017-08-10 MED ORDER — DIPHENHYDRAMINE HCL 12.5 MG/5ML PO ELIX: 12.5000 mg | ORAL_SOLUTION | ORAL | Status: DC | PRN

## 2017-08-10 MED ORDER — METHOCARBAMOL 500 MG PO TABS
500.0000 mg | ORAL_TABLET | Freq: Four times a day (QID) | ORAL | Status: DC | PRN
  Administered 2017-08-10: 500 mg via ORAL
  Filled 2017-08-10: qty 1

## 2017-08-10 MED ORDER — DEXMEDETOMIDINE HCL IN NACL 200 MCG/50ML IV SOLN
INTRAVENOUS | Status: AC
  Filled 2017-08-10: qty 50

## 2017-08-10 MED ORDER — HYDROCODONE-ACETAMINOPHEN 5-325 MG PO TABS
1.0000 | ORAL_TABLET | ORAL | Status: DC | PRN
  Filled 2017-08-10: qty 2

## 2017-08-10 MED ORDER — ONDANSETRON HCL 4 MG/2ML IJ SOLN
INTRAMUSCULAR | Status: DC | PRN
  Administered 2017-08-10: 4 mg via INTRAVENOUS

## 2017-08-10 MED ORDER — METHOCARBAMOL 1000 MG/10ML IJ SOLN
500.0000 mg | Freq: Four times a day (QID) | INTRAVENOUS | Status: DC | PRN
  Filled 2017-08-10: qty 5

## 2017-08-10 MED ORDER — ENOXAPARIN SODIUM 40 MG/0.4ML ~~LOC~~ SOLN
40.0000 mg | SUBCUTANEOUS | Status: DC
  Administered 2017-08-11: 40 mg via SUBCUTANEOUS
  Filled 2017-08-10: qty 0.4

## 2017-08-10 MED ORDER — ONDANSETRON HCL 4 MG PO TABS: 4.0000 mg | ORAL_TABLET | Freq: Four times a day (QID) | ORAL | Status: DC | PRN

## 2017-08-10 MED ORDER — SODIUM CHLORIDE 0.9 % IV SOLN: INTRAVENOUS | Status: DC

## 2017-08-10 MED ORDER — DOCUSATE SODIUM 100 MG PO CAPS
100.0000 mg | ORAL_CAPSULE | Freq: Two times a day (BID) | ORAL | Status: DC
  Administered 2017-08-10: 100 mg via ORAL
  Filled 2017-08-10: qty 1

## 2017-08-10 MED ORDER — MIDAZOLAM HCL 2 MG/2ML IJ SOLN
INTRAMUSCULAR | Status: AC
  Administered 2017-08-10: 1 mg
  Filled 2017-08-10: qty 2

## 2017-08-10 MED ORDER — 0.9 % SODIUM CHLORIDE (POUR BTL) OPTIME
TOPICAL | Status: DC | PRN
  Administered 2017-08-10: 1000 mL

## 2017-08-10 MED ORDER — DEXAMETHASONE SODIUM PHOSPHATE 10 MG/ML IJ SOLN
INTRAMUSCULAR | Status: AC
  Filled 2017-08-10: qty 2

## 2017-08-10 MED ORDER — WARFARIN SODIUM 5 MG PO TABS: 5.0000 mg | ORAL_TABLET | Freq: Every day | ORAL | Status: DC

## 2017-08-10 MED ORDER — ONDANSETRON HCL 4 MG/2ML IJ SOLN
INTRAMUSCULAR | Status: AC
  Filled 2017-08-10: qty 6

## 2017-08-10 MED ORDER — WARFARIN SODIUM 2.5 MG PO TABS
2.5000 mg | ORAL_TABLET | Freq: Once | ORAL | Status: AC
  Administered 2017-08-10: 2.5 mg via ORAL
  Filled 2017-08-10: qty 1

## 2017-08-10 MED ORDER — WARFARIN - PHARMACIST DOSING INPATIENT: Freq: Every day | Status: DC

## 2017-08-10 MED ORDER — FLUOXETINE HCL 20 MG PO CAPS: 20.0000 mg | ORAL_CAPSULE | Freq: Every day | ORAL | Status: DC

## 2017-08-10 MED ORDER — HYDROCODONE-ACETAMINOPHEN 7.5-325 MG PO TABS
1.0000 | ORAL_TABLET | ORAL | Status: DC | PRN
  Administered 2017-08-10: 2 via ORAL
  Filled 2017-08-10 (×2): qty 1

## 2017-08-10 MED ORDER — FLUOXETINE HCL 10 MG PO CAPS
10.0000 mg | ORAL_CAPSULE | Freq: Every day | ORAL | Status: DC
  Filled 2017-08-10: qty 1

## 2017-08-10 MED ORDER — LEVOTHYROXINE SODIUM 88 MCG PO TABS
88.0000 ug | ORAL_TABLET | Freq: Every day | ORAL | Status: DC
  Administered 2017-08-11: 88 ug via ORAL
  Filled 2017-08-10: qty 1

## 2017-08-10 MED ORDER — MORPHINE SULFATE (PF) 4 MG/ML IV SOLN: 0.5000 mg | INTRAVENOUS | Status: DC | PRN

## 2017-08-10 MED ORDER — MIDAZOLAM HCL 2 MG/2ML IJ SOLN
INTRAMUSCULAR | Status: DC | PRN
  Administered 2017-08-10: 1 mg via INTRAVENOUS

## 2017-08-10 MED ORDER — METOCLOPRAMIDE HCL 5 MG PO TABS: 5.0000 mg | ORAL_TABLET | Freq: Three times a day (TID) | ORAL | Status: DC | PRN

## 2017-08-10 MED ORDER — ISOPROPYL ALCOHOL 70 % SOLN
Status: DC | PRN
  Administered 2017-08-10: 1 via TOPICAL

## 2017-08-10 MED ORDER — CHLORHEXIDINE GLUCONATE 4 % EX LIQD: 60.0000 mL | Freq: Once | CUTANEOUS | Status: DC

## 2017-08-10 MED ORDER — METOCLOPRAMIDE HCL 5 MG/ML IJ SOLN: 5.0000 mg | Freq: Three times a day (TID) | INTRAMUSCULAR | Status: DC | PRN

## 2017-08-10 MED ORDER — CLONAZEPAM 0.5 MG PO TABS: 0.5000 mg | ORAL_TABLET | Freq: Every day | ORAL | Status: DC

## 2017-08-10 MED ORDER — FENTANYL CITRATE (PF) 250 MCG/5ML IJ SOLN: INTRAMUSCULAR | Status: DC | PRN

## 2017-08-10 MED ORDER — FENTANYL CITRATE (PF) 250 MCG/5ML IJ SOLN
INTRAMUSCULAR | Status: AC
  Filled 2017-08-10: qty 5

## 2017-08-10 MED ORDER — LORATADINE 10 MG PO TABS: 10.0000 mg | ORAL_TABLET | Freq: Every day | ORAL | Status: DC | PRN

## 2017-08-10 MED ORDER — CLINDAMYCIN PHOSPHATE 900 MG/50ML IV SOLN
900.0000 mg | INTRAVENOUS | Status: AC
  Administered 2017-08-10: 900 mg via INTRAVENOUS

## 2017-08-10 MED ORDER — MIDAZOLAM HCL 2 MG/2ML IJ SOLN
INTRAMUSCULAR | Status: AC
  Filled 2017-08-10: qty 2

## 2017-08-10 MED ORDER — PROPOFOL 10 MG/ML IV BOLUS
INTRAVENOUS | Status: DC | PRN
  Administered 2017-08-10: 150 mg via INTRAVENOUS

## 2017-08-10 MED ORDER — LACTATED RINGERS IV SOLN
INTRAVENOUS | Status: DC
  Administered 2017-08-10 (×2): via INTRAVENOUS

## 2017-08-10 MED ORDER — BUPIVACAINE-EPINEPHRINE (PF) 0.5% -1:200000 IJ SOLN
INTRAMUSCULAR | Status: DC | PRN
  Administered 2017-08-10 (×2): 20 mL via PERINEURAL

## 2017-08-10 SURGICAL SUPPLY — 62 items
BANDAGE ACE 4X5 VEL STRL LF (GAUZE/BANDAGES/DRESSINGS) ×2 IMPLANT
BANDAGE ACE 6X5 VEL STRL LF (GAUZE/BANDAGES/DRESSINGS) ×2 IMPLANT
BANDAGE ELASTIC 4 VELCRO ST LF (GAUZE/BANDAGES/DRESSINGS) ×1 IMPLANT
BANDAGE ELASTIC 6 VELCRO ST LF (GAUZE/BANDAGES/DRESSINGS) ×1 IMPLANT
BIT DRILL 2.0 (BIT) ×2
BIT DRILL 2.5X2.75 QC CALB (BIT) ×1 IMPLANT
BIT DRILL 2XNS DISP SS SM FRAG (BIT) IMPLANT
BIT DRILL CALIBRATED 2.7 (BIT) ×1 IMPLANT
BIT DRL 2XNS DISP SS SM FRAG (BIT) ×1
BNDG CMPR MED 10X6 ELC LF (GAUZE/BANDAGES/DRESSINGS) ×1
BNDG COHESIVE 4X5 TAN STRL (GAUZE/BANDAGES/DRESSINGS) ×2 IMPLANT
BNDG ELASTIC 6X10 VLCR STRL LF (GAUZE/BANDAGES/DRESSINGS) ×2 IMPLANT
CHLORAPREP W/TINT 26ML (MISCELLANEOUS) ×2 IMPLANT
CUFF TOURN SGL QUICK 34 (TOURNIQUET CUFF) ×2
CUFF TRNQT CYL 34X4X40X1 (TOURNIQUET CUFF) ×1 IMPLANT
DRAPE C-ARM 42X120 X-RAY (DRAPES) ×2 IMPLANT
DRAPE C-ARM 42X72 X-RAY (DRAPES) ×2 IMPLANT
DRAPE C-ARMOR (DRAPES) ×2 IMPLANT
DRAPE U-SHAPE 47X51 STRL (DRAPES) ×2 IMPLANT
DRSG ADAPTIC 3X8 NADH LF (GAUZE/BANDAGES/DRESSINGS) ×2 IMPLANT
DRSG PAD ABDOMINAL 8X10 ST (GAUZE/BANDAGES/DRESSINGS) ×2 IMPLANT
ELECT REM PT RETURN 9FT ADLT (ELECTROSURGICAL) ×2
ELECTRODE REM PT RTRN 9FT ADLT (ELECTROSURGICAL) ×1 IMPLANT
FACESHIELD WRAPAROUND (MASK) ×2 IMPLANT
FACESHIELD WRAPAROUND OR TEAM (MASK) ×1 IMPLANT
FIXATION ZIPTIGHT ANKLE SNDSMS (Ankle) IMPLANT
GAUZE SPONGE 4X4 12PLY STRL (GAUZE/BANDAGES/DRESSINGS) ×3 IMPLANT
GLOVE BIO SURGEON STRL SZ8.5 (GLOVE) IMPLANT
GLOVE BIOGEL PI IND STRL 8.5 (GLOVE) ×1 IMPLANT
GLOVE BIOGEL PI INDICATOR 8.5 (GLOVE) ×1
GOWN SPEC L3 XXLG W/TWL (GOWN DISPOSABLE) ×4 IMPLANT
GOWN STRL REUS W/TWL LRG LVL3 (GOWN DISPOSABLE) ×2 IMPLANT
K-WIRE ACE 1.6X6 (WIRE) ×6
KWIRE ACE 1.6X6 (WIRE) IMPLANT
MANIFOLD NEPTUNE II (INSTRUMENTS) ×2 IMPLANT
NS IRRIG 1000ML POUR BTL (IV SOLUTION) ×2 IMPLANT
PACK ORTHO EXTREMITY (CUSTOM PROCEDURE TRAY) ×2 IMPLANT
PAD ABD 8X10 STRL (GAUZE/BANDAGES/DRESSINGS) ×1 IMPLANT
PAD CAST 4YDX4 CTTN HI CHSV (CAST SUPPLIES) ×2 IMPLANT
PADDING CAST ABS 6INX4YD NS (CAST SUPPLIES) ×2
PADDING CAST ABS COTTON 6X4 NS (CAST SUPPLIES) ×2 IMPLANT
PADDING CAST COTTON 4X4 STRL (CAST SUPPLIES) ×2
PADDING CAST COTTON 6X4 STRL (CAST SUPPLIES) ×2 IMPLANT
PLATE FIBULAR COMP LOCK 10H (Plate) ×1 IMPLANT
SCREW CORTICAL 2.7 MM 22MM (Screw) ×1 IMPLANT
SCREW CORTICAL 2.7MM  18MM (Screw) ×1 IMPLANT
SCREW CORTICAL 2.7MM 18MM (Screw) IMPLANT
SCREW LOCK 3.5X12 DIST TIB (Screw) ×1 IMPLANT
SCREW LOCK 3.5X14 DIST TIB (Screw) ×1 IMPLANT
SCREW LOCK CORT STAR 3.5X10 (Screw) ×1 IMPLANT
SCREW NON LOCKING LP 3.5 14MM (Screw) ×3 IMPLANT
SCREW NON LOCKING LP 3.5 16MM (Screw) ×1 IMPLANT
SPLINT PLASTER CAST XFAST 5X30 (CAST SUPPLIES) IMPLANT
SPLINT PLASTER XFAST SET 5X30 (CAST SUPPLIES) ×1
STRIP CLOSURE SKIN 1/2X4 (GAUZE/BANDAGES/DRESSINGS) ×2 IMPLANT
SUT ETHILON 3 0 PS 1 (SUTURE) ×6 IMPLANT
SUT VIC AB 1 CT1 27 (SUTURE) ×4
SUT VIC AB 1 CT1 27XBRD ANTBC (SUTURE) ×2 IMPLANT
SUT VIC AB 3-0 PS2 18 (SUTURE) ×6
SUT VIC AB 3-0 PS2 18XBRD (SUTURE) ×3 IMPLANT
TOWEL OR 17X26 10 PK STRL BLUE (TOWEL DISPOSABLE) ×4 IMPLANT
ZIPTIGHT ANKLE SYNODESMOSS FIX (Ankle) ×2 IMPLANT

## 2017-08-10 NOTE — Anesthesia Procedure Notes (Signed)
Anesthesia Regional Block: Popliteal block   Pre-Anesthetic Checklist: ,, timeout performed, Correct Patient, Correct Site, Correct Laterality, Correct Procedure, Correct Position, site marked, Risks and benefits discussed,  Surgical consent,  Pre-op evaluation,  At surgeon's request and post-op pain management  Laterality: Left  Prep: chloraprep       Needles:  Injection technique: Single-shot  Needle Type: Echogenic Needle     Needle Length: 10cm  Needle Gauge: 21     Additional Needles:   Narrative:  Start time: 08/10/2017 11:25 AM End time: 08/10/2017 11:31 AM Injection made incrementally with aspirations every 5 mL.  Performed by: Personally  Anesthesiologist: Audry Pili, MD  Additional Notes: No pain on injection. No increased resistance to injection. Injection made in 5cc increments. Good needle visualization. Patient tolerated the procedure well.

## 2017-08-10 NOTE — Transfer of Care (Signed)
Immediate Anesthesia Transfer of Care Note  Patient: [name]  Procedure(s) Performed: OPEN REDUCTION INTERNAL FIXATION (ORIF) LEFT ANKLE FRACTURE (Left Ankle)  Patient Location: PACU  Anesthesia Type:General  Level of Consciousness: awake, alert  and oriented  Airway & Oxygen Therapy: Patient Spontanous Breathing and Patient connected to nasal cannula oxygen  Post-op Assessment: Report given to RN and Post -op Vital signs reviewed and stable  Post vital signs: Reviewed and stable  Last Vitals:  Vitals Value Taken Time  BP 126/68 08/10/2017  1:50 PM  Temp    Pulse 61 08/10/2017  1:50 PM  Resp 15 08/10/2017  1:50 PM  SpO2 100 % 08/10/2017  1:50 PM  Vitals shown include unvalidated device data.  Last Pain:  Vitals:   08/10/17 0933  TempSrc:   PainSc: 5       Patients Stated Pain Goal: 3 (38/88/28 0034)  Complications: No apparent anesthesia complications

## 2017-08-10 NOTE — H&P (Signed)
PREOPERATIVE H&P  Chief Complaint: Left ankle fracture  HPI: [name] is a 71 y.o. female who presents for preoperative history and physical with a diagnosis of Left ankle fracture.   She has elected for surgical management.  She has been off Coumadin for 5 days.  She was on a Lovenox bridge, last dose yesterday morning.  Past Medical History:  Diagnosis Date  . Acoustic neuroma (HCC)    Hx of right ear acoustic neuroma, removed in 1999. No hearing in right ear.  [name] Anxiety    Hx of, responds to Wellbutrin  . Basal cell carcinoma    s/p MOHS surgery in 03/2011  . Cataract   . COPD (chronic obstructive pulmonary disease) ([place])   . Deafness in right ear   . Family history of adverse reaction to anesthesia    Family hx of PONV  . Fracture    left lateral malleolus fracture  . GERD (gastroesophageal reflux disease)   . Headache(784.0)    MIGRAINES  . Hearing loss    Only in right ear, from an acoustic neuroma. S/P removal in 1999.   [name] History of hiatal hernia   . History of neck pain    Responds to Flexeril  . History of shingles   . Hypercholesteremia   . Hypertension   . Hypothyroidism   . PAF (paroxysmal atrial fibrillation) ([place])    2 brief episodes of Afib recorded on monitor 02/2007  . PONV (postoperative nausea and vomiting)   . Presence of permanent cardiac pacemaker   . Sinus bradycardia   . TIA (transient ischemic attack) 2011   On coumadin. Asymptomatic, without recurrence.   Past Surgical History:  Procedure Laterality Date  . ABDOMINAL HYSTERECTOMY    . APPENDECTOMY    . BREAST BIOPSY     X 3  . BREAST CYST ASPIRATION Left 50 yrs ago per pt  . BREAST EXCISIONAL BIOPSY Left 50 yrs ago  . CHOLECYSTECTOMY N/A 05/26/2013   Procedure: LAPAROSCOPIC CHOLECYSTECTOMY WITH INTRAOPERATIVE CHOLANGIOGRAM;  Surgeon: Imogene Burn. Georgette Dover, MD;  Location: [facility];  Service: General;  Laterality: N/A;  . CRANIECTOMY FOR EXCISION OF ACOUSTIC NEUROMA Right 1999  .  INTRAOCULAR LENS INSERTION     Dr. Talbert Forest  . MOHS SURGERY  03/2011   Basal cell skin cancer  . PACEMAKER IMPLANT N/A 12/19/2016   Medtronic Azure XT MRI conditional dual-chamber pacemaker for symptomatic sinus bradycardia by Dr Rayann Heman  . TONSILLECTOMY     Social History   Socioeconomic History  . Marital status: Married    Spouse name: [name]  . Number of children: 3  . Years of education: masters  . Highest education level: Not on file  Occupational History  . Occupation: retired    Comment: [employer]  Social Needs  . Financial resource strain: Not on file  . Food insecurity:    Worry: Not on file    Inability: Not on file  . Transportation needs:    Medical: Not on file    Non-medical: Not on file  Tobacco Use  . Smoking status: Never Smoker  . Smokeless tobacco: Never Used  Substance and Sexual Activity  . Alcohol use: Yes    Comment: Rare  . Drug use: No  . Sexual activity: Never  Lifestyle  . Physical activity:    Days per week: Not on file    Minutes per session: Not on file  . Stress: Not on file  Relationships  . Social connections:  Talks on phone: Not on file    Gets together: Not on file    Attends religious service: Not on file    Active member of club or organization: Not on file    Attends meetings of clubs or organizations: Not on file    Relationship status: Not on file  Other Topics Concern  . Not on file  Social History Narrative   Pt lives with spouse.    Caffeine Use: 1-2 glasses per week.   Family History  Problem Relation Age of Onset  . Alcoholism Father   . Liver cancer Father   . Diabetes Mother   . High Cholesterol Mother   . Hypertension Mother   . Memory loss Mother   . Healthy Sister   . Breast cancer Maternal Aunt   . Brain cancer Maternal Aunt   . Cancer Maternal Grandmother        Unknown type  . Hypertension Other        paternal & maternal side with HTN, all lived into their 58s  . Heart disease Other         paternal & maternal side with heart disease, all lived into their 38s  . Hyperlipidemia Other        paternal & maternal side with hyperlipidemia, all lived into their 47s  . CVA Other        paternal & maternal side with strokes, all lived into their 63s   Allergies  Allergen Reactions  . Atorvastatin Other (See Comments)    MUSCLE WEAKNESS  . Statins Other (See Comments)    MUSCLE WEAKNESS [CLASS EFFECT]  . Codeine Nausea Only  . Latex Rash  . Penicillins Rash and Other (See Comments)    Has patient had a PCN reaction causing immediate rash, facial/tongue/throat swelling, SOB or lightheadedness with hypotension: Rash in hands  Has patient had a PCN reaction causing severe rash involving mucus membranes or skin necrosis: no Has patient had a PCN reaction that required hospitalization No Has patient had a PCN reaction occurring within the last 10 years: No  If all of the above answers are "NO", then may proceed with Cephalosporin use.    Prior to Admission medications   Medication Sig Start Date End Date Taking? Authorizing Provider  acetaminophen (TYLENOL) 500 MG tablet Take 1,000 mg by mouth every 6 (six) hours as needed for moderate pain.   Yes [provider]  clonazePAM (KLONOPIN) 0.5 MG tablet Take 0.5 mg by mouth daily.  08/11/12  Yes [provider]  cyanocobalamin 1000 MCG tablet Take 1,000 mcg by mouth daily.    Yes [provider]  enoxaparin (LOVENOX) 100 MG/ML injection Inject 1 mL (100 mg total) into the skin daily. 08/06/17  Yes Belva Crome, MD  flecainide (TAMBOCOR) 100 MG tablet Take 2 tablets (200 mg total) by mouth as needed. Take 2 tablets as needed 1 hour after you have taken Toprol if still in A-fib Patient taking differently: Take 200 mg by mouth daily as needed (for 1 hour after taken Toprol if still in AFIB).  12/10/16  Yes Weaver, Scott T, PA-C  FLUoxetine (PROZAC) 10 MG capsule Take 10 mg by mouth daily with breakfast.  11/18/16  Yes  [provider]  FLUoxetine (PROZAC) 20 MG capsule Take 20 mg by mouth daily with breakfast.    Yes [provider]  ketotifen (ALAWAY) 0.025 % ophthalmic solution Place 1-2 drops into both eyes 2 (two) times daily as  needed (for allergies).   Yes [provider]  levothyroxine (SYNTHROID, LEVOTHROID) 88 MCG tablet Take 88 mcg by mouth daily before breakfast.   Yes [provider]  loratadine (CLARITIN) 10 MG tablet Take 10 mg by mouth daily as needed for allergies.   Yes [provider]  metoprolol succinate (TOPROL XL) 25 MG 24 hr tablet Take 0.5 tablets (12.5 mg total) by mouth daily as needed. For recurrent A-fib Patient taking differently: Take 12.5 mg by mouth daily as needed (for recurrent AFIB).  12/10/16  Yes Weaver, Scott T, PA-C  Omega-3 Fatty Acids (FISH OIL TRIPLE STRENGTH) 1400 MG CAPS Take 1,400 mg by mouth daily.    Yes [provider]  pantoprazole (PROTONIX) 40 MG tablet Take 40 mg by mouth daily as needed (acid reflux).    Yes [provider]  rosuvastatin (CRESTOR) 10 MG tablet Take 10 mg by mouth See admin instructions. Take 10 mg by mouth twice weekly on Tuesday and Thursday. 05/02/16  Yes [provider]  Vitamin D, Cholecalciferol, 400 UNITS CAPS Take 1,200 Units by mouth daily.    Yes [provider]  warfarin (COUMADIN) 2.5 MG tablet TAKE 1-2 TABLETS BY MOUTH DAILY AS DIRECTED BY COUMADIN CLINIC. Patient taking differently: Take 5 mg by mouth on Sunday, Tuesday and Thursday. Take 2.5 mg by mouth daily on all other days 07/27/17  Yes Belva Crome, MD  oxyCODONE-acetaminophen (PERCOCET/ROXICET) 5-325 MG tablet Take 1-2 tablets by mouth every 8 (eight) hours as needed for severe pain. Patient not taking: Reported on 08/06/2017 07/30/17   Providence Lanius A, PA-C     Positive ROS: All other systems have been reviewed and were otherwise negative with the exception of those mentioned in the HPI and as  above.  Physical Exam: General: Alert, no acute distress Cardiovascular: No pedal edema Respiratory: No cyanosis, no use of accessory musculature GI: No organomegaly, abdomen is soft and non-tender Skin: No lesions in the area of chief complaint Neurologic: Sensation intact distally Psychiatric: Patient is competent for consent with normal mood and affect Lymphatic: No axillary or cervical lymphadenopathy  MUSCULOSKELETAL: Examination of the left ankle reveals that her skin is intact.  She has swelling present, however there is skin wrinkling.  She has extensive ecchymosis.  She has subjective sensory change to the superficial peroneal distribution.  She has intact sensation over the deep peroneal and posterior tibial distributions.  She has palpable pedal pulses.  She is able to wiggle her toes.  Assessment: Left ankle fracture  Plan: Plan for Procedure(s): OPEN REDUCTION INTERNAL FIXATION (ORIF) ANKLE FRACTURE  Plan for ORIF of the left ankle today.  Overnight observation in hospital.  Nonweightbearing postoperatively.  Resume Coumadin tonight.  Resume Lovenox bridge.  Plan for discharge home tomorrow after working with physical therapy.  The risks, benefits, and alternatives were discussed with the patient. There are risks associated with the surgery including, but not limited to, problems with anesthesia (death), infection, differences in leg length/angulation/rotation, fracture of bones, loosening or failure of implants, malunion, nonunion, hematoma (blood accumulation) which may require surgical drainage, blood clots, pulmonary embolism, nerve injury (foot drop), and blood vessel injury. The patient understands these risks and elects to proceed.  Bertram Savin, MD Cell [phone]   08/10/2017 11:27 AM

## 2017-08-10 NOTE — Op Note (Signed)
OPERATIVE REPORT   08/10/2017  1:42 PM  PATIENT:  [name]   SURGEON:  Bertram Savin, MD  ASSISTANT: April Green, RNFA.   PREOPERATIVE DIAGNOSIS: Weber C left ankle fracture. Syndesmotic injury, left ankle.  POSTOPERATIVE DIAGNOSIS:  Same.  PROCEDURE:  1.  Open reduction internal fixation left lateral malleolus. 2.  Open treatment syndesmosis left ankle.  ANESTHESIA:   GETA. Regional block.  ANTIBIOTICS: 2 g Ancef.  IMPLANTS: Biomet Alps 10 hole locking composite plate with 3.5 millimeter screws. Biomet zip tight ankle syndesmosis implant.  SPECIMENS: None.  COMPLICATIONS: None.  DISPOSITION: Stable to PACU.  SURGICAL INDICATIONS:  [name] is a 72 y.o. female with a diagnosis of Weber C left ankle fracture with significant shortening and presumed syndesmotic injury.  The patient was indicated for surgical fixation.  Coumadin was discontinued 5 days before surgery. Lovenox bridge was discontinued yesterday.    The risks, benefits, and alternatives were discussed with the patient preoperatively including but not limited to the risks of infection, bleeding, nerve / blood vessel injury, malunion, nonunion, cardiopulmonary complications, the need for repeat surgery, among others, and the patient was willing to proceed.  PROCEDURE IN DETAIL: The patient was identified in the holding area using 2 identifiers.  The surgical site was marked by myself.  Regional block was placed by anesthesia.  Patient was taken the operating room, and general anesthesia was induced on the operating room table.  Tourniquet was applied to the left thigh.  The left lower extremity was prepped and draped in normal sterile surgical fashion.  Timeout was called, verifying site and site of surgery.  Antibiotics were given within 60 minutes of beginning the procedure.  Gravity was used to exsanguinate the lower extremity, and the tourniquet was elevated.  I made a longitudinal  incision over the lateral malleolus.  Distally, dissection was carried sharply to bone.  Proximally, careful blunt dissection was performed in order to protect the superficial peroneal nerve.  Periosteal elevator was used to expose the fibula.  I identified the lateral malleolus fracture.  She did have a long oblique fracture.  Scar tissue was excisionally debrided with a curette and rongeur.  Fracture was reduced with traction and a provisional K wire was placed.  I then placed a single lag screw using standard AO technique.  I selected a 10 hole plate which I pinned in place.  The plate was secured proximally and distally with 3.5 mm nonlocking screws.  A third screw was added proximally.  Distally, locking screw was placed.  The 2 compression screws distally were exchanged for locking screws.  Due to fracture morphology I placed a single zip tight syndesmotic implant according to manufacturer's instructions.  Device was tightened with the foot in maximum dorsiflexion.  I then obtained AP, lateral, mortise, and external rotation stress radiographs.  Fracture reduction was near-anatomic with restoration of fibular length.  Mortise intact.  No lateral talar shifting during stressing.  The tourniquet was let down.  Copious normal saline irrigation was performed.  Meticulous hemostasis was achieved with Bovie electrocautery.  I closed the fascia with 2-0 interrupted Vicryl sutures.  Deep dermal closure was performed with 3-0 interrupted Vicryl sutures.  Skin was closed with 3-0 nylon using Algower-Donati technique.  A well-padded, well molded short leg posterior splint with a U was applied.  Sponge, needle, and instrument counts were correct at the end of the case x2.  There were no known complications.  The patient was extubated,  and taken to the PACU in stable condition.  Postoperatively, she will be admitted for observation.  Nonweightbearing left lower extremity.  Strict elevation toes above nose.  Resume  Coumadin tonight.  Lovenox bridge.  Discontinue Lovenox when INR greater than 1.9.  Discharge home after she has cleared physical therapy.  She will need an INR check 48 hours after discharge.  Return to the office 2 weeks after discharge for radiographs and possible suture removal.

## 2017-08-10 NOTE — Anesthesia Procedure Notes (Signed)
Anesthesia Regional Block: Adductor canal block   Pre-Anesthetic Checklist: ,, timeout performed, Correct Patient, Correct Site, Correct Laterality, Correct Procedure, Correct Position, site marked, Risks and benefits discussed,  Surgical consent,  Pre-op evaluation,  At surgeon's request and post-op pain management  Laterality: Left  Prep: chloraprep       Needles:  Injection technique: Single-shot  Needle Type: Echogenic Needle     Needle Length: 10cm  Needle Gauge: 21     Additional Needles:   Narrative:  Start time: 08/10/2017 11:18 AM End time: 08/10/2017 11:22 AM Injection made incrementally with aspirations every 5 mL.  Performed by: Personally  Anesthesiologist: Audry Pili, MD  Additional Notes: No pain on injection. No increased resistance to injection. Injection made in 5cc increments. Good needle visualization. Patient tolerated the procedure well.

## 2017-08-10 NOTE — Progress Notes (Signed)
ANTICOAGULATION CONSULT NOTE - Initial Consult  Pharmacy Consult for warfarin Indication: atrial fibrillation  Allergies  Allergen Reactions  . Atorvastatin Other (See Comments)    MUSCLE WEAKNESS  . Statins Other (See Comments)    MUSCLE WEAKNESS [CLASS EFFECT]  . Codeine Nausea Only  . Latex Rash  . Penicillins Rash and Other (See Comments)    Has patient had a PCN reaction causing immediate rash, facial/tongue/throat swelling, SOB or lightheadedness with hypotension: Rash in hands  Has patient had a PCN reaction causing severe rash involving mucus membranes or skin necrosis: no Has patient had a PCN reaction that required hospitalization No Has patient had a PCN reaction occurring within the last 10 years: No  If all of the above answers are "NO", then may proceed with Cephalosporin use.     Patient Measurements: Weight: 145 lb (65.8 kg)   Vital Signs: Temp: 97.8 F (36.6 C) (05/06 1543) Temp Source: Oral (05/06 0903) BP: 134/79 (05/06 1543) Pulse Rate: 63 (05/06 1543)  Labs: Recent Labs    08/10/17 1541  HGB 11.7*  HCT 37.2  PLT 275  LABPROT 14.0  INR 1.09  CREATININE 0.94    Estimated Creatinine Clearance: 48.7 mL/min (by C-G formula based on SCr of 0.94 mg/dL).   Medical History: Past Medical History:  Diagnosis Date  . Acoustic neuroma (HCC)    Hx of right ear acoustic neuroma, removed in 1999. No hearing in right ear.  [name] Anxiety    Hx of, responds to Wellbutrin  . Basal cell carcinoma    s/p MOHS surgery in 03/2011  . Cataract   . COPD (chronic obstructive pulmonary disease) ([place])   . Deafness in right ear   . Family history of adverse reaction to anesthesia    Family hx of PONV  . Fracture    left lateral malleolus fracture  . GERD (gastroesophageal reflux disease)   . Headache(784.0)    MIGRAINES  . Hearing loss    Only in right ear, from an acoustic neuroma. S/P removal in 1999.   [name] History of hiatal hernia   . History of neck pain    Responds to Flexeril  . History of shingles   . Hypercholesteremia   . Hypertension   . Hypothyroidism   . PAF (paroxysmal atrial fibrillation) ([place])    2 brief episodes of Afib recorded on monitor 02/2007  . PONV (postoperative nausea and vomiting)   . Presence of permanent cardiac pacemaker   . Sinus bradycardia   . TIA (transient ischemic attack) 2011   On coumadin. Asymptomatic, without recurrence.      Assessment: 72 yo female on warfarin PTA for afib who is post-op ORIF for an ankle fracture. Patient has held warfarin for 5 days (last dose 4/30). Current home dose is 5 mg Sundays, Tuesdays, and Thursdays and 2.5mg  all other days. Post-op INR 1.09 (previously 2.7 on 5/2)  Goal of Therapy:  INR 2-3 Monitor platelets by anticoagulation protocol: Yes   Plan:  Restart warfarin at home dose of 2.5mg  today Daily INR while inpatient Monitor for s/sx of bleeding, Hgb, and PLT  Gretta Samons A. Levada Dy, PharmD, [place] Pager: [phone]  08/10/2017,4:40 PM

## 2017-08-10 NOTE — Anesthesia Preprocedure Evaluation (Addendum)
Anesthesia Evaluation  Patient identified by MRN, date of birth, ID band Patient awake    Reviewed: Allergy & Precautions, H&P , NPO status , Patient's Chart, lab work & pertinent test results  History of Anesthesia Complications (+) PONV  Airway Mallampati: II  TM Distance: >3 FB Neck ROM: Full    Dental  (+) Dental Advisory Given, Chipped   Pulmonary COPD,    breath sounds clear to auscultation       Cardiovascular hypertension, Pt. on medications + CAD and +CHF  + dysrhythmias Atrial Fibrillation + pacemaker  Rhythm:Regular Rate:Normal  '18 Stress - Clinically and electrically negative for ischemia. Normal perfusion. No ischemia or scar. This is a low risk study. LVEF is 62%  '18 TTE - EF 55% to 60%. Grade 2 diastolic dysfunction. Mild MR. Left atrium was mildly to moderately dilated. Right atrium was mildly dilated. PASP 30 mmHg.   Neuro/Psych  Headaches, Anxiety Right sided hearing loss s/p acoustic neuroma resection TIA   GI/Hepatic Neg liver ROS, hiatal hernia, GERD  Medicated and Controlled,  Endo/Other  Hypothyroidism   Renal/GU negative Renal ROS  negative genitourinary   Musculoskeletal negative musculoskeletal ROS (+)   Abdominal   Peds  Hematology negative hematology ROS (+)   Anesthesia Other Findings   Reproductive/Obstetrics                            Anesthesia Physical  Anesthesia Plan  ASA: III  Anesthesia Plan: General   Post-op Pain Management:  Regional for Post-op pain   Induction: Intravenous  PONV Risk Score and Plan: 4 or greater and Treatment may vary due to age or medical condition, TIVA and Ondansetron  Airway Management Planned: LMA  Additional Equipment: None  Intra-op Plan:   Post-operative Plan: Extubation in OR  Informed Consent: I have reviewed the patients History and Physical, chart, labs and discussed the procedure including the  risks, benefits and alternatives for the proposed anesthesia with the patient or authorized representative who has indicated his/her understanding and acceptance.   Dental advisory given  Plan Discussed with: CRNA and Anesthesiologist  Anesthesia Plan Comments:        Anesthesia Quick Evaluation

## 2017-08-10 NOTE — Discharge Instructions (Signed)
Nonweightbearing left lower extremity. Elevate toes above nose on blanket/pillows. Keep splint clean and dry.  Do not remove. Take Coumadin daily as directed. Discontinue Lovenox injections when INR greater than 1.9. Have INR checked at primary care doctor's office on Thursday.

## 2017-08-10 NOTE — Anesthesia Procedure Notes (Signed)
Procedure Name: LMA Insertion Date/Time: 08/10/2017 11:46 AM Performed by: Bryson Corona, CRNA Pre-anesthesia Checklist: Patient identified, Emergency Drugs available, Suction available and Patient being monitored Patient Re-evaluated:Patient Re-evaluated prior to induction Oxygen Delivery Method: Circle System Utilized Preoxygenation: Pre-oxygenation with 100% oxygen Induction Type: IV induction Ventilation: Mask ventilation without difficulty LMA: LMA inserted LMA Size: 4.0 Number of attempts: 1 Placement Confirmation: positive ETCO2 Tube secured with: Tape Dental Injury: Teeth and Oropharynx as per pre-operative assessment

## 2017-08-10 NOTE — Anesthesia Postprocedure Evaluation (Signed)
Anesthesia Post Note  Patient: [name]  Procedure(s) Performed: OPEN REDUCTION INTERNAL FIXATION (ORIF) LEFT ANKLE FRACTURE (Left Ankle)     Patient location during evaluation: PACU Anesthesia Type: General Level of consciousness: awake and alert Pain management: pain level controlled Vital Signs Assessment: post-procedure vital signs reviewed and stable Respiratory status: spontaneous breathing, nonlabored ventilation, respiratory function stable and patient connected to nasal cannula oxygen Cardiovascular status: blood pressure returned to baseline and stable Postop Assessment: no apparent nausea or vomiting Anesthetic complications: no    Last Vitals:  Vitals:   08/10/17 1415 08/10/17 1430  BP: 119/74 124/68  Pulse: (!) 58 (!) 59  Resp: (!) 21 15  Temp:    SpO2: 99% 99%    Last Pain:  Vitals:   08/10/17 1430  TempSrc:   PainSc: 0-No pain                 Larenda Reedy S

## 2017-08-11 ENCOUNTER — Encounter (HOSPITAL_COMMUNITY): Payer: Self-pay | Admitting: Orthopedic Surgery

## 2017-08-11 DIAGNOSIS — S93432A Sprain of tibiofibular ligament of left ankle, initial encounter: Secondary | ICD-10-CM | POA: Diagnosis not present

## 2017-08-11 DIAGNOSIS — S82832A Other fracture of upper and lower end of left fibula, initial encounter for closed fracture: Secondary | ICD-10-CM | POA: Diagnosis not present

## 2017-08-11 DIAGNOSIS — F419 Anxiety disorder, unspecified: Secondary | ICD-10-CM | POA: Diagnosis not present

## 2017-08-11 DIAGNOSIS — Z85828 Personal history of other malignant neoplasm of skin: Secondary | ICD-10-CM | POA: Diagnosis not present

## 2017-08-11 DIAGNOSIS — S8262XA Displaced fracture of lateral malleolus of left fibula, initial encounter for closed fracture: Secondary | ICD-10-CM | POA: Diagnosis not present

## 2017-08-11 DIAGNOSIS — J449 Chronic obstructive pulmonary disease, unspecified: Secondary | ICD-10-CM | POA: Diagnosis not present

## 2017-08-11 LAB — PROTIME-INR
INR: 1.1
Prothrombin Time: 14.2 seconds (ref 11.4–15.2)

## 2017-08-11 MED ORDER — ENOXAPARIN SODIUM 40 MG/0.4ML ~~LOC~~ SOLN: 40.0000 mg | SUBCUTANEOUS | 0 refills | Status: DC

## 2017-08-11 MED ORDER — SENNA 8.6 MG PO TABS: 2.0000 | ORAL_TABLET | Freq: Every day | ORAL | 3 refills | Status: DC

## 2017-08-11 MED ORDER — HYDROCODONE-ACETAMINOPHEN 7.5-325 MG PO TABS: 1.0000 | ORAL_TABLET | Freq: Four times a day (QID) | ORAL | 0 refills | Status: DC | PRN

## 2017-08-11 MED ORDER — DOCUSATE SODIUM 100 MG PO CAPS: 100.0000 mg | ORAL_CAPSULE | Freq: Two times a day (BID) | ORAL | 1 refills | Status: DC

## 2017-08-11 MED ORDER — ONDANSETRON HCL 4 MG PO TABS: 4.0000 mg | ORAL_TABLET | Freq: Four times a day (QID) | ORAL | 0 refills | Status: DC | PRN

## 2017-08-11 NOTE — Evaluation (Signed)
Occupational Therapy Evaluation Patient Details Name: [name] MRN: [id number] DOB: 08-24-1945 Today's Date: 08/11/2017    History of Present Illness  72 yo female s/p OPEN REDUCTION INTERNAL FIXATION (ORIF) LEFT ANKLE FRACTURE (Left Ankle)   Clinical Impression   Patient evaluated by Occupational Therapy with no further acute OT needs identified. All education has been completed and the patient has no further questions. See below for any follow-up Occupational Therapy or equipment needs. OT to sign off. Thank you for referral.      Follow Up Recommendations  No OT follow up    Equipment Recommendations  3 in 1 bedside commode    Recommendations for Other Services       Precautions / Restrictions Precautions Precautions: Fall Restrictions Weight Bearing Restrictions: Yes Other Position/Activity Restrictions: NWB      Mobility Bed Mobility Overal bed mobility: Independent                Transfers Overall transfer level: Modified independent                    Balance                                           ADL either performed or assessed with clinical judgement   ADL Overall ADL's : Needs assistance/impaired Eating/Feeding: Independent   Grooming: Independent   Upper Body Bathing: Independent   Lower Body Bathing: Modified independent Lower Body Bathing Details (indicate cue type and reason): educated on use of clean linen each and every shower. positioning L LE Upper Body Dressing : Independent   Lower Body Dressing: Min guard Lower Body Dressing Details (indicate cue type and reason): pt educated on gowns, dresses or wide leg shorts to help dress over the cast Toilet Transfer: Supervision/safety Toilet Transfer Details (indicate cue type and reason): discussed the use of 3n1 and agreeable   Toileting - Clothing Manipulation Details (indicate cue type and reason): pt describes "falling down to the seat height"     Tub/Shower Transfer Details (indicate cue type and reason): plans to continue to sponge bath and wash hair in the sink like before surgery   General ADL Comments: pt reports that prior to surg the dog was licking her toes. pt educated on avoiding the dog licking her and to keep a sock on toes if needed to keep the dog away from the incisional areas. pt reports that family will do all animal care. Pt educated on elevation in the house of L LE on couch vs bed. pt asking multiple questions concerning DME for progression of mobility and advised to wait on purchase pending progress. pt will use RW at this time     Vision Baseline Vision/History: No visual deficits       Perception     Praxis      Pertinent Vitals/Pain Pain Assessment: No/denies pain Pain Location: pt able to start to wiggle toes but block remains in place     Hand Dominance Right   Extremity/Trunk Assessment Upper Extremity Assessment Upper Extremity Assessment: Overall WFL for tasks assessed   Lower Extremity Assessment Lower Extremity Assessment: Defer to PT evaluation   Cervical / Trunk Assessment Cervical / Trunk Assessment: Normal   Communication Communication Communication: No difficulties   Cognition Arousal/Alertness: Awake/alert Behavior During Therapy: WFL for tasks assessed/performed Overall Cognitive Status: Within Functional Limits for  tasks assessed                                 General Comments: w   General Comments  pt noted to have a scuff mark on the bottom on cast. Pt advised to elevated L LE with transfers to avoid going further.     Exercises     Shoulder Instructions      Home Living Family/patient expects to be discharged to:: Private residence Living Arrangements: Spouse/significant other(other 3 adults in the home) Available Help at Discharge: Family Type of Home: House Home Access: Stairs to enter Technical brewer of Steps: 1   Home Layout: One  level     Bathroom Shower/Tub: Teacher, early years/pre: Standard Bathroom Accessibility: Yes How Accessible: Accessible via walker Home Equipment: Other (comment)(knee walker)   Additional Comments: pt has 3 working adults in the home that can give 24 / 7 (A) as needed. x2 dogs in the house and a cat. Pt reports that she can reside in areas of the home that he dogs are not       Prior Functioning/Environment Level of Independence: Independent        Comments: pt reports using knee walker to get to the bathroom area to wash hair        OT Problem List:        OT Treatment/Interventions:      OT Goals(Current goals can be found in the care plan section) Acute Rehab OT Goals Patient Stated Goal: to return home Potential to Achieve Goals: Good  OT Frequency:     Barriers to D/C:            Co-evaluation              AM-PAC PT "6 Clicks" Daily Activity     Outcome Measure Help from another person eating meals?: None Help from another person taking care of personal grooming?: None Help from another person toileting, which includes using toliet, bedpan, or urinal?: None Help from another person bathing (including washing, rinsing, drying)?: None Help from another person to put on and taking off regular upper body clothing?: None Help from another person to put on and taking off regular lower body clothing?: A Little 6 Click Score: 23   End of Session Equipment Utilized During Treatment: Rolling walker Nurse Communication: Mobility status;Precautions  Activity Tolerance: Patient tolerated treatment well Patient left: in bed;with call bell/phone within reach;with family/visitor present  OT Visit Diagnosis: Unsteadiness on feet (R26.81)                Time: 6834-1962 OT Time Calculation (min): 26 min Charges:  OT General Charges $OT Visit: 1 Visit OT Evaluation $OT Eval Moderate Complexity: 1 Mod G-Codes:      Jeri Modena   OTR/L Pager:  [phone] Office: [phone] .   Parke Poisson B 08/11/2017, 9:45 AM

## 2017-08-11 NOTE — Progress Notes (Signed)
Patient alert and oriented, mae's well, voiding adequate amount of urine, swallowing without difficulty,  c/o mild pain at time of discharge.  Patient stated will take medication at home. Patient discharged home with family. Script and discharged instructions given to patient. Patient and family stated understanding of instructions given. Patient has an appointment with Dr. Delfino Lovett

## 2017-08-11 NOTE — Progress Notes (Signed)
[place] for warfarin Indication: atrial fibrillation  Allergies  Allergen Reactions  . Atorvastatin Other (See Comments)    MUSCLE WEAKNESS  . Statins Other (See Comments)    MUSCLE WEAKNESS [CLASS EFFECT]  . Codeine Nausea Only  . Latex Rash  . Penicillins Rash and Other (See Comments)    Has patient had a PCN reaction causing immediate rash, facial/tongue/throat swelling, SOB or lightheadedness with hypotension: Rash in hands  Has patient had a PCN reaction causing severe rash involving mucus membranes or skin necrosis: no Has patient had a PCN reaction that required hospitalization No Has patient had a PCN reaction occurring within the last 10 years: No  If all of the above answers are "NO", then may proceed with Cephalosporin use.     Patient Measurements: Weight: 145 lb (65.8 kg)   Vital Signs: Temp: 98.4 F (36.9 C) (05/07 0739) Temp Source: Oral (05/07 0739) BP: 93/70 (05/07 0739) Pulse Rate: 64 (05/07 0739)  Labs: Recent Labs    08/10/17 1541 08/11/17 0554  HGB 11.7*  --   HCT 37.2  --   PLT 275  --   LABPROT 14.0 14.2  INR 1.09 1.10  CREATININE 0.94  --     Estimated Creatinine Clearance: 48.7 mL/min (by C-G formula based on SCr of 0.94 mg/dL).   Assessment: 72 yo female on warfarin PTA for afib now s/p ORIF for an ankle fracture. Patient has held warfarin for 5 days (last dose 4/30) before surgery- resumed yesterday evening post-op.  Home dose is 2.5mg  daily except 5mg  Sundays, Tuesdays, and Thursdays.  INR 1.1  Goal of Therapy:  INR 2-3 Monitor platelets by anticoagulation protocol: Yes   Plan:  Restart home warfarin dosing- 2.5mg  daily except 5mg  on Sundays, Tuesdays, and Thursdays *no dose entered for inpatient tonight as discharge medications have been ordered - please contact pharmacy if a warfarin dose needs to be given tonight in the hospital Patient is also to discharge with Lovenox 40mg  subQ  q24h Daily INR while inpatient Monitor for s/sx of bleeding  Aidin Doane D. Mayerli Kirst, PharmD, BCPS Clinical Pharmacist Clinical Phone for 08/11/2017 until 3:30pm: [phone] If after 3:30pm, please call main pharmacy at [phone] 08/11/2017 8:52 AM

## 2017-08-11 NOTE — Progress Notes (Signed)
    Subjective:  Patient reports pain as mild to moderate.  Denies N/V/CP/SOB. States block hasn't worn off.  Objective:   VITALS:   Vitals:   08/10/17 2011 08/10/17 2306 08/11/17 0430 08/11/17 0739  BP: 104/70 114/70 99/71 93/70   Pulse: 64 61 63 64  Resp: 18 18 16 18   Temp: 98.6 F (37 C) 98.2 F (36.8 C) 98 F (36.7 C) 98.4 F (36.9 C)  TempSrc: Oral Oral Oral Oral  SpO2: 94% 96% 96% 97%  Weight:        NAD ABD soft Intact pulses distally Splint c/d/i Toes warm  Unable to assess sensory & motor due to block  Lab Results  Component Value Date   WBC 8.7 08/10/2017   HGB 11.7 (L) 08/10/2017   HCT 37.2 08/10/2017   MCV 83.4 08/10/2017   PLT 275 08/10/2017   BMET    Component Value Date/Time   NA 140 07/30/2017 1343   NA 143 12/12/2016 1500   K 5.2 (H) 07/30/2017 1343   CL 106 07/30/2017 1343   CO2 24 07/30/2017 1343   GLUCOSE 108 (H) 07/30/2017 1343   BUN 15 07/30/2017 1343   BUN 11 12/12/2016 1500   CREATININE 0.94 08/10/2017 1541   CALCIUM 9.6 07/30/2017 1343   GFRNONAA 59 (L) 08/10/2017 1541   GFRAA >60 08/10/2017 1541     Assessment/Plan: 1 Day Post-Op   Principal Problem:   Closed fracture of left lateral malleolus   WBAT with walker DVT ppx: Lovenox and Coumadin, SCDs, TEDS PO pain control PT/OT Dispo: D/C home with HEP   Hilton Cork Reign Dziuba 08/11/2017, 7:57 AM   Rod Can, MD Cell [phone]

## 2017-08-11 NOTE — Evaluation (Signed)
Physical Therapy Evaluation and Discharge Patient Details Name: [name] MRN: [id number] DOB: 1945/05/10 Today's Date: 08/11/2017   History of Present Illness   72 yo female s/p ORIF L Ankle fx on 08/10/17. Pt is NWB on the LLE.   Clinical Impression  Patient evaluated by Physical Therapy with no further acute PT needs identified. All education has been completed and the patient has no further questions. At the time of PT eval pt was able to perform transfers and ambulation with gross modified independence, and close guard for safety during stair training. Pt educated on car transfer, and general safety with mobility/activity progression at home. See below for any follow-up Physical Therapy or equipment needs. PT is signing off. Thank you for this referral.     Follow Up Recommendations No PT follow up;Supervision - Intermittent    Equipment Recommendations  Rolling walker with 5" wheels;3in1 (PT)    Recommendations for Other Services       Precautions / Restrictions Precautions Precautions: Fall Restrictions Weight Bearing Restrictions: Yes Other Position/Activity Restrictions: NWB      Mobility  Bed Mobility Overal bed mobility: Independent                Transfers Overall transfer level: Modified independent Equipment used: Rolling walker (2 wheeled)             General transfer comment: VC's for hand placement on seated surface for safety.   Ambulation/Gait Ambulation/Gait assistance: Modified independent (Device/Increase time) Ambulation Distance (Feet): 100 Feet Assistive device: Rolling walker (2 wheeled) Gait Pattern/deviations: Step-to pattern;Decreased stride length;Trunk flexed Gait velocity: Decreased Gait velocity interpretation: <1.31 ft/sec, indicative of household ambulator General Gait Details: VC's for improved posture and closer walker proximity. Pt was instructed in locking elbows and using lats to elevate trunk for swing through so  pt's shoulders would be less strained.   Stairs Stairs: Yes Stairs assistance: Min guard Stair Management: No rails;Backwards;With walker Number of Stairs: 1 General stair comments: Pt was educated on curb step negotiation backwards with walker. Family present for education as well and pt completed without difficulty.   Wheelchair Mobility    Modified Rankin (Stroke Patients Only)       Balance Overall balance assessment: Needs assistance Sitting-balance support: Feet supported;No upper extremity supported Sitting balance-Leahy Scale: Good     Standing balance support: No upper extremity supported;During functional activity Standing balance-Leahy Scale: Poor Standing balance comment: Reliant on UE support for balance due to NWB status at this time.                              Pertinent Vitals/Pain Pain Assessment: No/denies pain Pain Location: pt able to start to wiggle toes but block remains in place. Pt was educated in pain control recommendations (positioning, ice, etc) once block wears off.     Home Living Family/patient expects to be discharged to:: Private residence Living Arrangements: Spouse/significant other(other 3 adults in the home) Available Help at Discharge: Family Type of Home: House Home Access: Stairs to enter   Technical brewer of Steps: 1 Home Layout: One level Home Equipment: Other (comment)(knee walker) Additional Comments: pt has 3 working adults in the home that can give 24 / 7 (A) as needed. x2 dogs in the house and a cat. Pt reports that she can reside in areas of the home that the dogs are not     Prior Function Level of Independence: Independent  Comments: pt reports using knee walker to get to the bathroom area to wash hair     Hand Dominance   Dominant Hand: Right    Extremity/Trunk Assessment   Upper Extremity Assessment Upper Extremity Assessment: Overall WFL for tasks assessed    Lower Extremity  Assessment Lower Extremity Assessment: LLE deficits/detail LLE: Unable to fully assess due to immobilization LLE Sensation: (Nerve block in place)    Cervical / Trunk Assessment Cervical / Trunk Assessment: Normal  Communication   Communication: No difficulties  Cognition Arousal/Alertness: Awake/alert Behavior During Therapy: WFL for tasks assessed/performed Overall Cognitive Status: Within Functional Limits for tasks assessed                                 General Comments: w      General Comments General comments (skin integrity, edema, etc.): pt noted to have a scuff mark on the bottom on cast. Pt advised to elevated L LE with transfers to avoid going further.     Exercises     Assessment/Plan    PT Assessment Patent does not need any further PT services  PT Problem List         PT Treatment Interventions      PT Goals (Current goals can be found in the Care Plan section)  Acute Rehab PT Goals Patient Stated Goal: to return home PT Goal Formulation: All assessment and education complete, DC therapy    Frequency     Barriers to discharge        Co-evaluation               AM-PAC PT "6 Clicks" Daily Activity  Outcome Measure Difficulty turning over in bed (including adjusting bedclothes, sheets and blankets)?: None Difficulty moving from lying on back to sitting on the side of the bed? : A Little Difficulty sitting down on and standing up from a chair with arms (e.g., wheelchair, bedside commode, etc,.)?: A Little Help needed moving to and from a bed to chair (including a wheelchair)?: A Little Help needed walking in hospital room?: A Little Help needed climbing 3-5 steps with a railing? : A Little 6 Click Score: 19    End of Session Equipment Utilized During Treatment: Gait belt Activity Tolerance: Patient tolerated treatment well Patient left: with call bell/phone within reach;in bed;with family/visitor present Nurse Communication:  Mobility status PT Visit Diagnosis: Unsteadiness on feet (R26.81);Difficulty in walking, not elsewhere classified (R26.2)    Time: 5784-6962 PT Time Calculation (min) (ACUTE ONLY): 31 min   Charges:   PT Evaluation $PT Eval Moderate Complexity: 1 Mod PT Treatments $Gait Training: 8-22 mins   PT G Codes:        Rolinda Roan, PT, DPT Acute Rehabilitation Services Pager: [phone]   Thelma Comp 08/11/2017, 10:36 AM

## 2017-08-11 NOTE — Discharge Summary (Signed)
Physician Discharge Summary  Patient ID: [name] MRN: [id number] DOB/AGE: 06-Dec-1945 72 y.o.  Admit date: 08/10/2017 Discharge date: 08/11/2017  Admission Diagnoses:  Closed fracture of left lateral malleolus  Discharge Diagnoses:  Principal Problem:   Closed fracture of left lateral malleolus   Past Medical History:  Diagnosis Date  . Acoustic neuroma (HCC)    Hx of right ear acoustic neuroma, removed in 1999. No hearing in right ear.  [name] Anxiety    Hx of, responds to Wellbutrin  . Basal cell carcinoma    s/p MOHS surgery in 03/2011  . Cataract   . COPD (chronic obstructive pulmonary disease) ([place])   . Deafness in right ear   . Family history of adverse reaction to anesthesia    Family hx of PONV  . Fracture    left lateral malleolus fracture  . GERD (gastroesophageal reflux disease)   . Headache(784.0)    MIGRAINES  . Hearing loss    Only in right ear, from an acoustic neuroma. S/P removal in 1999.   [name] History of hiatal hernia   . History of neck pain    Responds to Flexeril  . History of shingles   . Hypercholesteremia   . Hypertension   . Hypothyroidism   . PAF (paroxysmal atrial fibrillation) ([place])    2 brief episodes of Afib recorded on monitor 02/2007  . PONV (postoperative nausea and vomiting)   . Presence of permanent cardiac pacemaker   . Sinus bradycardia   . TIA (transient ischemic attack) 2011   On coumadin. Asymptomatic, without recurrence.    Surgeries: Procedure(s): OPEN REDUCTION INTERNAL FIXATION (ORIF) LEFT ANKLE FRACTURE on 08/10/2017   Consultants (if any):   Discharged Condition: Improved  Hospital Course: [name] is an 72 y.o. female who was admitted 08/10/2017 with a diagnosis of Closed fracture of left lateral malleolus and went to the operating room on 08/10/2017 and underwent the above named procedures.    She was given perioperative antibiotics:  Anti-infectives (From admission, onward)   Start     Dose/Rate Route  Frequency Ordered Stop   08/10/17 1700  clindamycin (CLEOCIN) IVPB 600 mg     600 mg 100 mL/hr over 30 Minutes Intravenous Every 6 hours 08/10/17 1529 08/11/17 0532   08/10/17 0922  clindamycin (CLEOCIN) 900 MG/50ML IVPB    Note to Pharmacy:  Merryl Hacker   : cabinet override      08/10/17 0922 08/10/17 1150   08/10/17 0919  clindamycin (CLEOCIN) IVPB 900 mg     900 mg 100 mL/hr over 30 Minutes Intravenous On call to O.R. 08/10/17 0919 08/10/17 1220    .  She was given sequential compression devices, and coumadin with lovenox bridge for DVT prophylaxis.  She benefited maximally from the hospital stay and there were no complications.    Recent vital signs:  Vitals:   08/11/17 0430 08/11/17 0739  BP: 99/71 93/70  Pulse: 63 64  Resp: 16 18  Temp: 98 F (36.7 C) 98.4 F (36.9 C)  SpO2: 96% 97%    Recent laboratory studies:  Lab Results  Component Value Date   HGB 11.7 (L) 08/10/2017   HGB 12.3 07/30/2017   HGB 12.3 12/12/2016   Lab Results  Component Value Date   WBC 8.7 08/10/2017   PLT 275 08/10/2017   Lab Results  Component Value Date   INR 1.10 08/11/2017   Lab Results  Component Value Date   NA 140 07/30/2017  K 5.2 (H) 07/30/2017   CL 106 07/30/2017   CO2 24 07/30/2017   BUN 15 07/30/2017   CREATININE 0.94 08/10/2017   GLUCOSE 108 (H) 07/30/2017    Discharge Medications:   Allergies as of 08/11/2017      Reactions   Atorvastatin Other (See Comments)   MUSCLE WEAKNESS   Statins Other (See Comments)   MUSCLE WEAKNESS [CLASS EFFECT]   Codeine Nausea Only   Latex Rash   Penicillins Rash, Other (See Comments)   Has patient had a PCN reaction causing immediate rash, facial/tongue/throat swelling, SOB or lightheadedness with hypotension: Rash in hands  Has patient had a PCN reaction causing severe rash involving mucus membranes or skin necrosis: no Has patient had a PCN reaction that required hospitalization No Has patient had a PCN reaction occurring  within the last 10 years: No  If all of the above answers are "NO", then may proceed with Cephalosporin use.      Medication List    STOP taking these medications   acetaminophen 500 MG tablet Commonly known as:  TYLENOL   oxyCODONE-acetaminophen 5-325 MG tablet Commonly known as:  PERCOCET/ROXICET     TAKE these medications   ALAWAY 0.025 % ophthalmic solution Generic drug:  ketotifen Place 1-2 drops into both eyes 2 (two) times daily as needed (for allergies).   clonazePAM 0.5 MG tablet Commonly known as:  KLONOPIN Take 0.5 mg by mouth daily.   cyanocobalamin 1000 MCG tablet Take 1,000 mcg by mouth daily.   docusate sodium 100 MG capsule Commonly known as:  COLACE Take 1 capsule (100 mg total) by mouth 2 (two) times daily.   enoxaparin 40 MG/0.4ML injection Commonly known as:  LOVENOX Inject 0.4 mLs (40 mg total) into the skin daily. What changed:    medication strength  how much to take   FISH OIL TRIPLE STRENGTH 1400 MG Caps Take 1,400 mg by mouth daily.   flecainide 100 MG tablet Commonly known as:  TAMBOCOR Take 2 tablets (200 mg total) by mouth as needed. Take 2 tablets as needed 1 hour after you have taken Toprol if still in A-fib What changed:    when to take this  reasons to take this  additional instructions   FLUoxetine 20 MG capsule Commonly known as:  PROZAC Take 20 mg by mouth daily with breakfast.   FLUoxetine 10 MG capsule Commonly known as:  PROZAC Take 10 mg by mouth daily with breakfast.   HYDROcodone-acetaminophen 7.5-325 MG tablet Commonly known as:  NORCO Take 1-2 tablets by mouth every 6 (six) hours as needed.   levothyroxine 88 MCG tablet Commonly known as:  SYNTHROID, LEVOTHROID Take 88 mcg by mouth daily before breakfast.   loratadine 10 MG tablet Commonly known as:  CLARITIN Take 10 mg by mouth daily as needed for allergies.   metoprolol succinate 25 MG 24 hr tablet Commonly known as:  TOPROL XL Take 0.5 tablets  (12.5 mg total) by mouth daily as needed. For recurrent A-fib What changed:    reasons to take this  additional instructions   ondansetron 4 MG tablet Commonly known as:  ZOFRAN Take 1 tablet (4 mg total) by mouth every 6 (six) hours as needed for nausea.   pantoprazole 40 MG tablet Commonly known as:  PROTONIX Take 40 mg by mouth daily as needed (acid reflux).   rosuvastatin 10 MG tablet Commonly known as:  CRESTOR Take 10 mg by mouth See admin instructions. Take 10 mg by mouth  twice weekly on Tuesday and Thursday.   senna 8.6 MG Tabs tablet Commonly known as:  SENOKOT Take 2 tablets (17.2 mg total) by mouth at bedtime.   Vitamin D (Cholecalciferol) 400 units Caps Take 1,200 Units by mouth daily.   warfarin 2.5 MG tablet Commonly known as:  COUMADIN Take as directed. If you are unsure how to take this medication, talk to your nurse or doctor. Original instructions:  TAKE 1-2 TABLETS BY MOUTH DAILY AS DIRECTED BY COUMADIN CLINIC. What changed:  See the new instructions.       Diagnostic Studies: Dg Tibia/fibula Left  Result Date: 07/30/2017 CLINICAL DATA:  Left lower extremity pain after fall. EXAM: LEFT TIBIA AND FIBULA - 2 VIEW COMPARISON:  None. FINDINGS: Mildly displaced oblique fracture is seen involving the distal left fibula. The tibia appears normal. No soft tissue abnormality is noted. IMPRESSION: Mildly displaced distal left fibular fracture. Electronically Signed   By: Marijo Conception, M.D.   On: 07/30/2017 18:26   Dg Ankle Complete Left  Result Date: 08/10/2017 CLINICAL DATA:  Left ankle ORIF. EXAM: LEFT ANKLE COMPLETE - 3+ VIEW; DG C-ARM 61-120 MIN COMPARISON:  Left ankle x-rays dated July 30, 2017. FLUOROSCOPY TIME:  55 seconds C-arm fluoroscopic images were obtained intraoperatively and submitted for post operative interpretation. FINDINGS: Intraoperative x-rays demonstrating interval lateral plate and screw fixation of the distal fibular fracture with  tightrope repair of the syndesmosis. The ankle mortise is symmetric. The talar dome is intact. IMPRESSION: Distal fibular fracture ORIF and syndesmosis repair. No acute abnormality. Electronically Signed   By: Titus Dubin M.D.   On: 08/10/2017 13:48   Dg Ankle Complete Left  Result Date: 07/30/2017 CLINICAL DATA:  Pain following fall EXAM: LEFT ANKLE COMPLETE - 3+ VIEW COMPARISON:  None. FINDINGS: Frontal, oblique, and lateral views obtained. There is a comminuted spiral type fracture of the distal fibular diaphysis with a fracture fragment displaced medially. Other fracture fragments are in near anatomic alignment. There is diffuse swelling laterally. No other evident fracture. No appreciable joint effusion. Ankle mortise appears intact. No appreciable arthropathy. IMPRESSION: Comminuted spiral fracture distal fibular diaphysis with a displaced fracture fragment medially. Other fracture elements are in near anatomic alignment. Diffuse soft tissue swelling laterally. Ankle mortise appears intact. No other fracture evident. Electronically Signed   By: Lowella Grip III M.D.   On: 07/30/2017 14:47   Dg Ankle Left Port  Result Date: 08/10/2017 CLINICAL DATA:  ORIF of left ankle EXAM: PORTABLE LEFT ANKLE - 2 VIEW COMPARISON:  None. FINDINGS: The patient is now in a cast. The patient is status post distal fibular fracture ORIF and syndesmosis repair. No other changes. IMPRESSION: Postsurgical changes as above. Electronically Signed   By: Dorise Bullion III M.D   On: 08/10/2017 14:13   Dg Foot Complete Left  Result Date: 07/30/2017 CLINICAL DATA:  Pain following fall with twisting injury EXAM: LEFT FOOT - COMPLETE 3+ VIEW COMPARISON:  Left ankle radiographs July 30, 2017 FINDINGS: Frontal, oblique, and lateral views obtained. Fracture of the distal fibula is described in the left ankle report. In the foot region, there is no appreciable fracture or dislocation. Joint spaces appear normal. No erosive  change. IMPRESSION: Comminuted fracture distal fibula. In the foot region, no evident fracture or dislocation. No appreciable arthropathy. Electronically Signed   By: Lowella Grip III M.D.   On: 07/30/2017 14:48   Dg C-arm 1-60 Min  Result Date: 08/10/2017 CLINICAL DATA:  Left ankle ORIF. EXAM:  LEFT ANKLE COMPLETE - 3+ VIEW; DG C-ARM 61-120 MIN COMPARISON:  Left ankle x-rays dated July 30, 2017. FLUOROSCOPY TIME:  55 seconds C-arm fluoroscopic images were obtained intraoperatively and submitted for post operative interpretation. FINDINGS: Intraoperative x-rays demonstrating interval lateral plate and screw fixation of the distal fibular fracture with tightrope repair of the syndesmosis. The ankle mortise is symmetric. The talar dome is intact. IMPRESSION: Distal fibular fracture ORIF and syndesmosis repair. No acute abnormality. Electronically Signed   By: Titus Dubin M.D.   On: 08/10/2017 13:48    Disposition:    Discharge Instructions    Call MD / Call 911   Complete by:  As directed    If you experience chest pain or shortness of breath, CALL 911 and be transported to the hospital emergency room.  If you develope a fever above 101 F, pus (white drainage) or increased drainage or redness at the wound, or calf pain, call your surgeon's office.   Constipation Prevention   Complete by:  As directed    Drink plenty of fluids.  Prune juice may be helpful.  You may use a stool softener, such as Colace (over the counter) 100 mg twice a day.  Use MiraLax (over the counter) for constipation as needed.   Diet - low sodium heart healthy   Complete by:  As directed    Discharge instructions   Complete by:  As directed    Nonweightbearing left lower extremity. Elevate toes above nose on blanket/pillows. Keep splint clean and dry.  Do not remove. Take Coumadin daily as directed. Discontinue Lovenox injections when INR greater than 1.9. Have INR checked at primary care doctor's office on  Thursday.   Driving restrictions   Complete by:  As directed    No driving for 6 weeks   Increase activity slowly as tolerated   Complete by:  As directed    Lifting restrictions   Complete by:  As directed    No lifting for 6 weeks      Follow-up Information    Helayne Metsker, Aaron Edelman, MD. Schedule an appointment as soon as possible for a visit in 2 weeks.   Specialty:  Orthopedic Surgery Why:  For suture removal Contact information: [address] [place] [address] [phone]            Signed: Hilton Cork Joevanni Roddey 08/12/2017, 11:48 AM

## 2017-08-14 ENCOUNTER — Ambulatory Visit (INDEPENDENT_AMBULATORY_CARE_PROVIDER_SITE_OTHER): Payer: Medicare Other

## 2017-08-14 DIAGNOSIS — I480 Paroxysmal atrial fibrillation: Secondary | ICD-10-CM

## 2017-08-14 DIAGNOSIS — Z5181 Encounter for therapeutic drug level monitoring: Secondary | ICD-10-CM | POA: Diagnosis not present

## 2017-08-14 LAB — POCT INR: INR: 1.6

## 2017-08-14 NOTE — Patient Instructions (Signed)
Description   Take 5mg  today, then resume same dosage 2.5mg  daily except 5mg  on Sundays, Tuesdays, and Thursdays.  Resume Lovenox 100mg  tonight at 8pm, take daily at 8pm until INR recheck on Monday.  Call [facility] [phone]  Main [phone] with any questions or changes.

## 2017-08-17 ENCOUNTER — Ambulatory Visit (INDEPENDENT_AMBULATORY_CARE_PROVIDER_SITE_OTHER): Payer: Medicare Other | Admitting: *Deleted

## 2017-08-17 DIAGNOSIS — I480 Paroxysmal atrial fibrillation: Secondary | ICD-10-CM | POA: Diagnosis not present

## 2017-08-17 DIAGNOSIS — Z5181 Encounter for therapeutic drug level monitoring: Secondary | ICD-10-CM

## 2017-08-17 LAB — POCT INR: INR: 2.1

## 2017-08-17 NOTE — Patient Instructions (Signed)
Description   Stop Lovenox injections.  Continue taking same dosage 2.5mg  daily except 5mg  on Sundays, Tuesdays, and Thursdays. Recheck INR 1 week. Call [facility] [phone]  Main [phone] with any questions or changes.

## 2017-08-24 ENCOUNTER — Ambulatory Visit (INDEPENDENT_AMBULATORY_CARE_PROVIDER_SITE_OTHER): Payer: Medicare Other | Admitting: Pharmacist

## 2017-08-24 DIAGNOSIS — Z5181 Encounter for therapeutic drug level monitoring: Secondary | ICD-10-CM

## 2017-08-24 DIAGNOSIS — Z4789 Encounter for other orthopedic aftercare: Secondary | ICD-10-CM | POA: Diagnosis not present

## 2017-08-24 DIAGNOSIS — I480 Paroxysmal atrial fibrillation: Secondary | ICD-10-CM | POA: Diagnosis not present

## 2017-08-24 LAB — POCT INR: INR: 2.5

## 2017-09-03 NOTE — Addendum Note (Signed)
Addendum  created 09/03/17 1244 by Myrtie Soman, MD   [facility]ra[facility] Staff edited

## 2017-09-07 ENCOUNTER — Ambulatory Visit (INDEPENDENT_AMBULATORY_CARE_PROVIDER_SITE_OTHER): Payer: Medicare Other | Admitting: Pharmacist Clinician (PhC)/ Clinical Pharmacy Specialist

## 2017-09-07 DIAGNOSIS — I63419 Cerebral infarction due to embolism of unspecified middle cerebral artery: Secondary | ICD-10-CM

## 2017-09-07 DIAGNOSIS — Z5181 Encounter for therapeutic drug level monitoring: Secondary | ICD-10-CM

## 2017-09-07 DIAGNOSIS — Z7901 Long term (current) use of anticoagulants: Secondary | ICD-10-CM

## 2017-09-07 DIAGNOSIS — I480 Paroxysmal atrial fibrillation: Secondary | ICD-10-CM

## 2017-09-07 LAB — POCT INR: INR: 3.2 — AB (ref 2.0–3.0)

## 2017-09-07 NOTE — Patient Instructions (Signed)
Description   Take only 1.25 mg (1/2 tablet) today Monday June 3, ontinue 2.5mg  daily except 5mg  on Sundays, Tuesdays, and Thursdays. Recheck INR 2 week.

## 2017-09-21 ENCOUNTER — Ambulatory Visit (INDEPENDENT_AMBULATORY_CARE_PROVIDER_SITE_OTHER): Payer: Medicare Other | Admitting: Pharmacist Clinician (PhC)/ Clinical Pharmacy Specialist

## 2017-09-21 DIAGNOSIS — S8262XD Displaced fracture of lateral malleolus of left fibula, subsequent encounter for closed fracture with routine healing: Secondary | ICD-10-CM | POA: Diagnosis not present

## 2017-09-21 DIAGNOSIS — Z5181 Encounter for therapeutic drug level monitoring: Secondary | ICD-10-CM

## 2017-09-21 DIAGNOSIS — I63419 Cerebral infarction due to embolism of unspecified middle cerebral artery: Secondary | ICD-10-CM

## 2017-09-21 DIAGNOSIS — I480 Paroxysmal atrial fibrillation: Secondary | ICD-10-CM

## 2017-09-21 LAB — POCT INR: INR: 3.3 — AB (ref 2.0–3.0)

## 2017-09-21 NOTE — Patient Instructions (Signed)
Description   Decrease dose to 2 tablets each Sunday and Thursday, 1 tablet all other days, repeat INR in 2 weeks

## 2017-09-28 ENCOUNTER — Ambulatory Visit: Payer: Medicare Other | Admitting: Interventional Cardiology

## 2017-10-01 NOTE — Progress Notes (Signed)
Cardiology Office Note    Date:  10/02/2017   ID:  [name], DOB 1945-09-24, MRN [id number]  PCP:  Cathy Dials, MD  Cardiologist: Cathy Grooms, MD   Chief Complaint  Patient presents with  . Atrial Fibrillation    History of Present Illness:  [name] is a 72 y.o. female  paroxysmal atrial fibrillation, embolic CVA which led to the diagnosis of atrial fibrillation, chronic Coumadin anticoagulation, subsequent development of tachycardia bradycardia syndrome, ultimately treated with DDD pacemaker 12/20/2016.  Since being seen she fractured the left lower extremity and had surgery.  Coumadin therapy was interrupted with Lovenox bridging.  She had no cardiac issues during therapy.  She will begin rehab within the next week.  The fracture was related to a misstep and not fainting.  She has had no tachycardia/atrial fibrillation that she is aware of.   Past Medical History:  Diagnosis Date  . Acoustic neuroma (HCC)    Hx of right ear acoustic neuroma, removed in 1999. No hearing in right ear.  [name] Anxiety    Hx of, responds to Wellbutrin  . Basal cell carcinoma    s/p MOHS surgery in 03/2011  . Cataract   . Chronic anticoagulation 11/24/2016  . Chronic cholecystitis with calculus 05/13/2013  . Closed fracture of left lateral malleolus 08/10/2017  . COPD (chronic obstructive pulmonary disease) ([name])   . Deafness in right ear   . Embolic stroke ([name]) 6/76/7209  . Family history of adverse reaction to anesthesia    Family hx of PONV  . Fracture    left lateral malleolus fracture  . GERD (gastroesophageal reflux disease)   . Headache(784.0)    MIGRAINES  . Hearing loss    Only in right ear, from an acoustic neuroma. S/P removal in 1999.   [name] History of hiatal hernia   . History of neck pain    Responds to Flexeril  . History of shingles   . Hypercholesteremia   . Hypertension   . Hypothyroidism   . PAF (paroxysmal atrial fibrillation) ([name])    2 brief  episodes of Afib recorded on monitor 02/2007  . Paroxysmal atrial fibrillation (HCC)   . PONV (postoperative nausea and vomiting)   . Presence of permanent cardiac pacemaker   . Sick sinus syndrome ([name]) 12/19/2016  . Sinus bradycardia   . Tachycardia-bradycardia syndrome ([place]) 11/24/2016  . TIA (transient ischemic attack) 2011   On coumadin. Asymptomatic, without recurrence.    Past Surgical History:  Procedure Laterality Date  . ABDOMINAL HYSTERECTOMY    . APPENDECTOMY    . BREAST BIOPSY     X 3  . BREAST CYST ASPIRATION Left 50 yrs ago per pt  . BREAST EXCISIONAL BIOPSY Left 50 yrs ago  . CHOLECYSTECTOMY N/A 05/26/2013   Procedure: LAPAROSCOPIC CHOLECYSTECTOMY WITH INTRAOPERATIVE CHOLANGIOGRAM;  Surgeon: Imogene Burn. Georgette Dover, MD;  Location: [name];  Service: General;  Laterality: N/A;  . CRANIECTOMY FOR EXCISION OF ACOUSTIC NEUROMA Right 1999  . INTRAOCULAR LENS INSERTION     Dr. Talbert Forest  . MOHS SURGERY  03/2011   Basal cell skin cancer  . ORIF ANKLE FRACTURE Left 08/10/2017   Procedure: OPEN REDUCTION INTERNAL FIXATION (ORIF) LEFT ANKLE FRACTURE;  Surgeon: Rod Can, MD;  Location: [name];  Service: Orthopedics;  Laterality: Left;  . PACEMAKER IMPLANT N/A 12/19/2016   Medtronic Azure XT MRI conditional dual-chamber pacemaker for symptomatic sinus bradycardia by Dr Rayann Heman  . TONSILLECTOMY  Current Medications: Outpatient Medications Prior to Visit  Medication Sig Dispense Refill  . clonazePAM (KLONOPIN) 0.5 MG tablet Take 0.5 mg by mouth daily.     . flecainide (TAMBOCOR) 100 MG tablet Take 200 mg by mouth daily as needed (Take one (1) hour after Toprol if still in AFIB.).    [name] FLUoxetine (PROZAC) 10 MG capsule Take 10 mg by mouth daily with breakfast. Take with 20 mg to equal 30 mg daily.    [name] FLUoxetine (PROZAC) 20 MG capsule Take 20 mg by mouth daily with breakfast. Take with 10 mg to equal 30 mg daily.    [name] HYDROcodone-acetaminophen (NORCO) 7.5-325 MG tablet Take 1 tablet  by mouth every 6 (six) hours as needed for pain.    [name] ketotifen (ALAWAY) 0.025 % ophthalmic solution Place 1-2 drops into both eyes 2 (two) times daily as needed (for allergies).    [name] levothyroxine (SYNTHROID, LEVOTHROID) 88 MCG tablet Take 88 mcg by mouth daily before breakfast.    . loratadine (CLARITIN) 10 MG tablet Take 10 mg by mouth daily as needed for allergies.    . metoprolol succinate (TOPROL-XL) 25 MG 24 hr tablet Take 12.5 mg by mouth daily as needed (For recurrent AFIB).    . pantoprazole (PROTONIX) 40 MG tablet Take 40 mg by mouth daily as needed (acid reflux).     . warfarin (COUMADIN) 2.5 MG tablet Take one (1) to two (2) tablets by mouth daily as directed by Coumadin Clinic.    . rosuvastatin (CRESTOR) 10 MG tablet Take 10 mg by mouth See admin instructions. Take 10 mg by mouth twice weekly on Tuesday and Thursday.    . cyanocobalamin 1000 MCG tablet Take 1,000 mcg by mouth daily.     [name] docusate sodium (COLACE) 100 MG capsule Take 1 capsule (100 mg total) by mouth 2 (two) times daily. (Patient not taking: Reported on 10/02/2017) 60 capsule 1  . enoxaparin (LOVENOX) 40 MG/0.4ML injection Inject 0.4 mLs (40 mg total) into the skin daily. (Patient not taking: Reported on 10/02/2017) 5 Syringe 0  . flecainide (TAMBOCOR) 100 MG tablet Take 2 tablets (200 mg total) by mouth as needed. Take 2 tablets as needed 1 hour after you have taken Toprol if still in A-fib (Patient not taking: Reported on 10/02/2017) 30 tablet 11  . HYDROcodone-acetaminophen (NORCO) 7.5-325 MG tablet Take 1-2 tablets by mouth every 6 (six) hours as needed. (Patient not taking: Reported on 10/02/2017) 50 tablet 0  . metoprolol succinate (TOPROL XL) 25 MG 24 hr tablet Take 0.5 tablets (12.5 mg total) by mouth daily as needed. For recurrent A-fib (Patient not taking: Reported on 10/02/2017) 30 tablet 11  . Omega-3 Fatty Acids (FISH OIL TRIPLE STRENGTH) 1400 MG CAPS Take 1,400 mg by mouth daily.     . ondansetron (ZOFRAN) 4 MG  tablet Take 1 tablet (4 mg total) by mouth every 6 (six) hours as needed for nausea. (Patient not taking: Reported on 10/02/2017) 20 tablet 0  . senna (SENOKOT) 8.6 MG TABS tablet Take 2 tablets (17.2 mg total) by mouth at bedtime. (Patient not taking: Reported on 10/02/2017) 60 each 3  . Vitamin D, Cholecalciferol, 400 UNITS CAPS Take 1,200 Units by mouth daily.     [name] warfarin (COUMADIN) 2.5 MG tablet TAKE 1-2 TABLETS BY MOUTH DAILY AS DIRECTED BY COUMADIN CLINIC. (Patient not taking: Reported on 10/02/2017) 45 tablet 2   No facility-administered medications prior to visit.      Allergies:  Atorvastatin; Statins; Codeine; Latex; and Penicillins   Social History   Socioeconomic History  . Marital status: Married    Spouse name: [name]  . Number of children: 3  . Years of education: masters  . Highest education level: Not on file  Occupational History  . Occupation: retired    Comment: [employer]  Social Needs  . Financial resource strain: Not on file  . Food insecurity:    Worry: Not on file    Inability: Not on file  . Transportation needs:    Medical: Not on file    Non-medical: Not on file  Tobacco Use  . Smoking status: Never Smoker  . Smokeless tobacco: Never Used  Substance and Sexual Activity  . Alcohol use: Yes    Comment: Rare  . Drug use: No  . Sexual activity: Never  Lifestyle  . Physical activity:    Days per week: Not on file    Minutes per session: Not on file  . Stress: Not on file  Relationships  . Social connections:    Talks on phone: Not on file    Gets together: Not on file    Attends religious service: Not on file    Active member of club or organization: Not on file    Attends meetings of clubs or organizations: Not on file    Relationship status: Not on file  Other Topics Concern  . Not on file  Social History Narrative   Pt lives with spouse.    Caffeine Use: 1-2 glasses per week.     Family History:  The patient's family history includes  Alcoholism in her father; Brain cancer in her maternal aunt; Breast cancer in her maternal aunt; CVA in her other; Cancer in her maternal grandmother; Diabetes in her mother; Healthy in her sister; Heart disease in her other; High Cholesterol in her mother; Hyperlipidemia in her other; Hypertension in her mother and other; Liver cancer in her father; Memory loss in her mother.   ROS:   Please see the history of present illness.    None other than fractured left lower extremity.  Now wearing a supportive boot. All other systems reviewed and are negative.   PHYSICAL EXAM:   VS:  BP 104/66   Pulse 68   Ht 5\' 5"  (1.651 m)   BMI 24.13 kg/m    GEN: Well nourished, well developed, in no acute distress  HEENT: normal  Neck: no JVD, carotid bruits, or masses Cardiac: RRR; no murmurs, rubs, or gallops,no edema  Respiratory:  clear to auscultation bilaterally, normal work of breathing GI: soft, nontender, nondistended, + BS MS: Protective left lower extremity immobilizer boot from ankle to knee. Skin: warm and dry, no rash Neuro:  Alert and Oriented x 3, Strength and sensation are intact Psych: euthymic mood, full affect  Wt Readings from Last 3 Encounters:  08/10/17 145 lb (65.8 kg)  07/30/17 145 lb (65.8 kg)  03/23/17 146 lb 3.2 oz (66.3 kg)      Studies/Labs Reviewed:   EKG:  EKG  not repeated.  Recent Labs: 11/21/2016: ALT 15 07/30/2017: BUN 15; Potassium 5.2; Sodium 140 08/10/2017: Creatinine, Ser 0.94; Hemoglobin 11.7; Platelets 275   Lipid Panel    Component Value Date/Time   CHOL 181 12/26/2013 0824   TRIG 224.0 (H) 12/26/2013 0824   HDL 46.00 12/26/2013 0824   CHOLHDL 4 12/26/2013 0824   VLDL 44.8 (H) 12/26/2013 0824   LDLCALC 68 05/09/2013 0940   LDLDIRECT  106.6 12/26/2013 0824    Additional studies/ records that were reviewed today include:  None    ASSESSMENT:    1. Tachycardia-bradycardia syndrome ([place])   2. Paroxysmal atrial fibrillation ([place])   3.  Essential hypertension   4. Cerebrovascular accident (CVA) due to embolism of middle cerebral artery, unspecified blood vessel laterality ([place])   5. Chronic anticoagulation      PLAN:  In order of problems listed above:  1. Managed with permanent pacemaker and Tambocor.  No medication side effects. 2. No recent symptomatic episodes. 3. Excellent blood pressure.  Target blood pressure less than or equal to 130/80 mmHg. 4. Not addressed 5. Chronic anticoagulation with Coumadin INR 2-2.5.  Clinical follow-up in 1 year.  Earlier if sustained cardiac arrhythmia or other complaints.  Continue to follow-up in device clinic.    Medication Adjustments/Labs and Tests Ordered: Current medicines are reviewed at length with the patient today.  Concerns regarding medicines are outlined above.  Medication changes, Labs and Tests ordered today are listed in the Patient Instructions below. Patient Instructions  Medication Instructions:  Your physician recommends that you continue on your current medications as directed. Please refer to the Current Medication list given to you today.  Labwork: None  Testing/Procedures: None  Follow-Up: Your physician wants you to follow-up in: 1 year with Dr. Tamala Julian.  You will receive a reminder letter in the mail two months in advance. If you don't receive a letter, please call our office to schedule the follow-up appointment.   Any Other Special Instructions Will Be Listed Below (If Applicable).     If you need a refill on your cardiac medications before your next appointment, please call your pharmacy.      Signed, Cathy Grooms, MD  10/02/2017 1:42 PM    [facility] [address], [address] [place], [place]  [address] Phone: [phone]; Fax: [phone]

## 2017-10-02 ENCOUNTER — Encounter: Payer: Self-pay | Admitting: Interventional Cardiology

## 2017-10-02 ENCOUNTER — Ambulatory Visit (INDEPENDENT_AMBULATORY_CARE_PROVIDER_SITE_OTHER): Payer: Medicare Other | Admitting: Interventional Cardiology

## 2017-10-02 VITALS — BP 104/66 | HR 68 | Ht 65.0 in

## 2017-10-02 DIAGNOSIS — I10 Essential (primary) hypertension: Secondary | ICD-10-CM

## 2017-10-02 DIAGNOSIS — I480 Paroxysmal atrial fibrillation: Secondary | ICD-10-CM

## 2017-10-02 DIAGNOSIS — Z7901 Long term (current) use of anticoagulants: Secondary | ICD-10-CM | POA: Diagnosis not present

## 2017-10-02 DIAGNOSIS — I63419 Cerebral infarction due to embolism of unspecified middle cerebral artery: Secondary | ICD-10-CM | POA: Diagnosis not present

## 2017-10-02 DIAGNOSIS — I495 Sick sinus syndrome: Secondary | ICD-10-CM

## 2017-10-02 NOTE — Patient Instructions (Signed)

## 2017-10-05 ENCOUNTER — Ambulatory Visit (INDEPENDENT_AMBULATORY_CARE_PROVIDER_SITE_OTHER): Payer: Medicare Other | Admitting: Pharmacist Clinician (PhC)/ Clinical Pharmacy Specialist

## 2017-10-05 DIAGNOSIS — S8262XD Displaced fracture of lateral malleolus of left fibula, subsequent encounter for closed fracture with routine healing: Secondary | ICD-10-CM | POA: Diagnosis not present

## 2017-10-05 DIAGNOSIS — I480 Paroxysmal atrial fibrillation: Secondary | ICD-10-CM

## 2017-10-05 DIAGNOSIS — Z5181 Encounter for therapeutic drug level monitoring: Secondary | ICD-10-CM | POA: Diagnosis not present

## 2017-10-05 DIAGNOSIS — I63419 Cerebral infarction due to embolism of unspecified middle cerebral artery: Secondary | ICD-10-CM

## 2017-10-05 LAB — POCT INR: INR: 3.3 — AB (ref 2.0–3.0)

## 2017-10-05 NOTE — Patient Instructions (Signed)
Description   Decrease dose to 2 tablets each Sunday, 1 tablet all other days, repeat INR in 2 weeks

## 2017-10-12 DIAGNOSIS — S8262XD Displaced fracture of lateral malleolus of left fibula, subsequent encounter for closed fracture with routine healing: Secondary | ICD-10-CM | POA: Diagnosis not present

## 2017-10-16 ENCOUNTER — Ambulatory Visit (INDEPENDENT_AMBULATORY_CARE_PROVIDER_SITE_OTHER): Payer: Medicare Other | Admitting: Pharmacist

## 2017-10-16 DIAGNOSIS — I480 Paroxysmal atrial fibrillation: Secondary | ICD-10-CM

## 2017-10-16 DIAGNOSIS — S8262XD Displaced fracture of lateral malleolus of left fibula, subsequent encounter for closed fracture with routine healing: Secondary | ICD-10-CM | POA: Diagnosis not present

## 2017-10-16 DIAGNOSIS — Z5181 Encounter for therapeutic drug level monitoring: Secondary | ICD-10-CM | POA: Diagnosis not present

## 2017-10-16 LAB — POCT INR: INR: 2.4 (ref 2.0–3.0)

## 2017-10-23 DIAGNOSIS — S8262XD Displaced fracture of lateral malleolus of left fibula, subsequent encounter for closed fracture with routine healing: Secondary | ICD-10-CM | POA: Diagnosis not present

## 2017-10-26 ENCOUNTER — Ambulatory Visit (INDEPENDENT_AMBULATORY_CARE_PROVIDER_SITE_OTHER): Payer: Medicare Other | Admitting: *Deleted

## 2017-10-26 DIAGNOSIS — S8262XD Displaced fracture of lateral malleolus of left fibula, subsequent encounter for closed fracture with routine healing: Secondary | ICD-10-CM | POA: Diagnosis not present

## 2017-10-26 DIAGNOSIS — I495 Sick sinus syndrome: Secondary | ICD-10-CM

## 2017-10-26 NOTE — Progress Notes (Signed)
Remote pacemaker transmission.   

## 2017-10-27 ENCOUNTER — Encounter: Payer: Self-pay | Admitting: Cardiology

## 2017-11-02 ENCOUNTER — Ambulatory Visit (INDEPENDENT_AMBULATORY_CARE_PROVIDER_SITE_OTHER): Payer: Medicare Other | Admitting: Pharmacist Clinician (PhC)/ Clinical Pharmacy Specialist

## 2017-11-02 DIAGNOSIS — Z5181 Encounter for therapeutic drug level monitoring: Secondary | ICD-10-CM | POA: Diagnosis not present

## 2017-11-02 DIAGNOSIS — S8262XD Displaced fracture of lateral malleolus of left fibula, subsequent encounter for closed fracture with routine healing: Secondary | ICD-10-CM | POA: Diagnosis not present

## 2017-11-02 DIAGNOSIS — I480 Paroxysmal atrial fibrillation: Secondary | ICD-10-CM | POA: Diagnosis not present

## 2017-11-02 LAB — POCT INR: INR: 1.7 — AB (ref 2.0–3.0)

## 2017-11-09 DIAGNOSIS — S8262XD Displaced fracture of lateral malleolus of left fibula, subsequent encounter for closed fracture with routine healing: Secondary | ICD-10-CM | POA: Diagnosis not present

## 2017-11-13 ENCOUNTER — Other Ambulatory Visit: Payer: Self-pay | Admitting: Interventional Cardiology

## 2017-11-16 ENCOUNTER — Ambulatory Visit (INDEPENDENT_AMBULATORY_CARE_PROVIDER_SITE_OTHER): Payer: Medicare Other | Admitting: Pharmacist

## 2017-11-16 DIAGNOSIS — Z5181 Encounter for therapeutic drug level monitoring: Secondary | ICD-10-CM | POA: Diagnosis not present

## 2017-11-16 DIAGNOSIS — I480 Paroxysmal atrial fibrillation: Secondary | ICD-10-CM

## 2017-11-16 LAB — POCT INR: INR: 1.9 — AB (ref 2.0–3.0)

## 2017-11-19 LAB — CUP PACEART REMOTE DEVICE CHECK
Battery Remaining Longevity: 154 mo
Brady Statistic AP VS Percent: 92.14 %
Brady Statistic AS VP Percent: 0.01 %
Brady Statistic RA Percent Paced: 94.67 %
Date Time Interrogation Session: 20190722071453
Implantable Lead Implant Date: 20180914
Implantable Lead Location: 753860
Implantable Lead Model: 5076
Lead Channel Impedance Value: 323 Ohm
Lead Channel Pacing Threshold Pulse Width: 0.4 ms
Lead Channel Pacing Threshold Pulse Width: 0.4 ms
Lead Channel Sensing Intrinsic Amplitude: 13.125 mV
Lead Channel Sensing Intrinsic Amplitude: 13.125 mV
Lead Channel Sensing Intrinsic Amplitude: 3.75 mV
Lead Channel Sensing Intrinsic Amplitude: 3.75 mV
Lead Channel Setting Pacing Pulse Width: 0.4 ms
MDC IDC LEAD IMPLANT DT: 20180914
MDC IDC LEAD LOCATION: 753859
MDC IDC MSMT BATTERY VOLTAGE: 3.07 V
MDC IDC MSMT LEADCHNL RA IMPEDANCE VALUE: 456 Ohm
MDC IDC MSMT LEADCHNL RA PACING THRESHOLD AMPLITUDE: 0.625 V
MDC IDC MSMT LEADCHNL RV IMPEDANCE VALUE: 437 Ohm
MDC IDC MSMT LEADCHNL RV IMPEDANCE VALUE: 551 Ohm
MDC IDC MSMT LEADCHNL RV PACING THRESHOLD AMPLITUDE: 0.625 V
MDC IDC PG IMPLANT DT: 20180914
MDC IDC SET LEADCHNL RA PACING AMPLITUDE: 1.5 V
MDC IDC SET LEADCHNL RV PACING AMPLITUDE: 2.5 V
MDC IDC SET LEADCHNL RV SENSING SENSITIVITY: 0.9 mV
MDC IDC STAT BRADY AP VP PERCENT: 1.5 %
MDC IDC STAT BRADY AS VS PERCENT: 6.34 %
MDC IDC STAT BRADY RV PERCENT PACED: 1.52 %

## 2017-11-27 ENCOUNTER — Other Ambulatory Visit: Payer: Self-pay | Admitting: Family Medicine

## 2017-11-27 DIAGNOSIS — Z1231 Encounter for screening mammogram for malignant neoplasm of breast: Secondary | ICD-10-CM

## 2017-11-30 ENCOUNTER — Ambulatory Visit (INDEPENDENT_AMBULATORY_CARE_PROVIDER_SITE_OTHER): Payer: Medicare Other | Admitting: Pharmacist

## 2017-11-30 DIAGNOSIS — L820 Inflamed seborrheic keratosis: Secondary | ICD-10-CM | POA: Diagnosis not present

## 2017-11-30 DIAGNOSIS — L814 Other melanin hyperpigmentation: Secondary | ICD-10-CM | POA: Diagnosis not present

## 2017-11-30 DIAGNOSIS — Z5181 Encounter for therapeutic drug level monitoring: Secondary | ICD-10-CM | POA: Diagnosis not present

## 2017-11-30 DIAGNOSIS — I480 Paroxysmal atrial fibrillation: Secondary | ICD-10-CM

## 2017-11-30 DIAGNOSIS — L905 Scar conditions and fibrosis of skin: Secondary | ICD-10-CM | POA: Diagnosis not present

## 2017-11-30 DIAGNOSIS — L821 Other seborrheic keratosis: Secondary | ICD-10-CM | POA: Diagnosis not present

## 2017-11-30 DIAGNOSIS — D225 Melanocytic nevi of trunk: Secondary | ICD-10-CM | POA: Diagnosis not present

## 2017-11-30 DIAGNOSIS — L570 Actinic keratosis: Secondary | ICD-10-CM | POA: Diagnosis not present

## 2017-11-30 DIAGNOSIS — D1801 Hemangioma of skin and subcutaneous tissue: Secondary | ICD-10-CM | POA: Diagnosis not present

## 2017-11-30 DIAGNOSIS — R238 Other skin changes: Secondary | ICD-10-CM | POA: Diagnosis not present

## 2017-11-30 DIAGNOSIS — D485 Neoplasm of uncertain behavior of skin: Secondary | ICD-10-CM | POA: Diagnosis not present

## 2017-11-30 LAB — POCT INR: INR: 2.6 (ref 2.0–3.0)

## 2017-12-09 ENCOUNTER — Ambulatory Visit (INDEPENDENT_AMBULATORY_CARE_PROVIDER_SITE_OTHER): Payer: Medicare Other | Admitting: Pharmacist Clinician (PhC)/ Clinical Pharmacy Specialist

## 2017-12-09 DIAGNOSIS — Z5181 Encounter for therapeutic drug level monitoring: Secondary | ICD-10-CM | POA: Diagnosis not present

## 2017-12-09 DIAGNOSIS — Z7901 Long term (current) use of anticoagulants: Secondary | ICD-10-CM | POA: Diagnosis not present

## 2017-12-09 DIAGNOSIS — I480 Paroxysmal atrial fibrillation: Secondary | ICD-10-CM | POA: Diagnosis not present

## 2017-12-09 DIAGNOSIS — I63419 Cerebral infarction due to embolism of unspecified middle cerebral artery: Secondary | ICD-10-CM | POA: Diagnosis not present

## 2017-12-09 LAB — POCT INR: INR: 1.4 — AB (ref 2.0–3.0)

## 2017-12-17 ENCOUNTER — Ambulatory Visit (INDEPENDENT_AMBULATORY_CARE_PROVIDER_SITE_OTHER): Payer: Medicare Other | Admitting: Pharmacist

## 2017-12-17 DIAGNOSIS — Z5181 Encounter for therapeutic drug level monitoring: Secondary | ICD-10-CM | POA: Diagnosis not present

## 2017-12-17 DIAGNOSIS — I480 Paroxysmal atrial fibrillation: Secondary | ICD-10-CM | POA: Diagnosis not present

## 2017-12-17 LAB — POCT INR: INR: 2.2 (ref 2.0–3.0)

## 2018-01-04 ENCOUNTER — Ambulatory Visit (INDEPENDENT_AMBULATORY_CARE_PROVIDER_SITE_OTHER): Payer: Medicare Other | Admitting: Pharmacist Clinician (PhC)/ Clinical Pharmacy Specialist

## 2018-01-04 DIAGNOSIS — Z5181 Encounter for therapeutic drug level monitoring: Secondary | ICD-10-CM

## 2018-01-04 DIAGNOSIS — I480 Paroxysmal atrial fibrillation: Secondary | ICD-10-CM | POA: Diagnosis not present

## 2018-01-04 LAB — POCT INR: INR: 2 (ref 2.0–3.0)

## 2018-01-05 ENCOUNTER — Ambulatory Visit: Payer: Medicare Other

## 2018-01-05 DIAGNOSIS — D485 Neoplasm of uncertain behavior of skin: Secondary | ICD-10-CM | POA: Diagnosis not present

## 2018-01-05 DIAGNOSIS — L905 Scar conditions and fibrosis of skin: Secondary | ICD-10-CM | POA: Diagnosis not present

## 2018-01-06 DIAGNOSIS — I10 Essential (primary) hypertension: Secondary | ICD-10-CM | POA: Diagnosis not present

## 2018-01-06 DIAGNOSIS — F419 Anxiety disorder, unspecified: Secondary | ICD-10-CM | POA: Diagnosis not present

## 2018-01-06 DIAGNOSIS — K5900 Constipation, unspecified: Secondary | ICD-10-CM | POA: Diagnosis not present

## 2018-01-06 DIAGNOSIS — I4891 Unspecified atrial fibrillation: Secondary | ICD-10-CM | POA: Diagnosis not present

## 2018-01-06 DIAGNOSIS — E785 Hyperlipidemia, unspecified: Secondary | ICD-10-CM | POA: Diagnosis not present

## 2018-01-06 DIAGNOSIS — E039 Hypothyroidism, unspecified: Secondary | ICD-10-CM | POA: Diagnosis not present

## 2018-01-08 ENCOUNTER — Ambulatory Visit: Payer: Medicare Other

## 2018-01-13 DIAGNOSIS — L239 Allergic contact dermatitis, unspecified cause: Secondary | ICD-10-CM | POA: Diagnosis not present

## 2018-01-18 ENCOUNTER — Ambulatory Visit (INDEPENDENT_AMBULATORY_CARE_PROVIDER_SITE_OTHER): Payer: Medicare Other | Admitting: Pharmacist

## 2018-01-18 DIAGNOSIS — Z5181 Encounter for therapeutic drug level monitoring: Secondary | ICD-10-CM

## 2018-01-18 DIAGNOSIS — I480 Paroxysmal atrial fibrillation: Secondary | ICD-10-CM | POA: Diagnosis not present

## 2018-01-18 LAB — POCT INR: INR: 2.3 (ref 2.0–3.0)

## 2018-01-25 ENCOUNTER — Ambulatory Visit (INDEPENDENT_AMBULATORY_CARE_PROVIDER_SITE_OTHER): Payer: Medicare Other | Admitting: *Deleted

## 2018-01-25 DIAGNOSIS — I495 Sick sinus syndrome: Secondary | ICD-10-CM

## 2018-01-25 NOTE — Progress Notes (Signed)
Remote pacemaker transmission.   

## 2018-01-26 ENCOUNTER — Encounter: Payer: Self-pay | Admitting: Cardiology

## 2018-02-02 ENCOUNTER — Telehealth: Payer: Self-pay

## 2018-02-02 NOTE — Research (Signed)
BLUE SYNC Informed Consent   Subject Name: [name]  Subject met inclusion and exclusion criteria.  The informed consent form, study requirements and expectations were reviewed with the subject and questions and concerns were addressed prior to the signing of the consent form.  The subject verbalized understanding of the trail requirements.  The subject agreed to participate in the [facility] trial and signed the informed consent.  The informed consent was obtained prior to performance of any protocol-specific procedures for the subject.  A copy of the signed informed consent was given to the subject and a copy was placed in the subject's medical record.  Tiara S Woodard 02/02/2018, 3:00 PM

## 2018-02-02 NOTE — Telephone Encounter (Signed)
Called patient to complete annual survey.

## 2018-02-03 ENCOUNTER — Ambulatory Visit
Admission: RE | Admit: 2018-02-03 | Discharge: 2018-02-03 | Disposition: A | Discharge status: Home / Self Care | Payer: Medicare Other | Source: Ambulatory Visit | Attending: Family Medicine | Admitting: Family Medicine

## 2018-02-03 ENCOUNTER — Ambulatory Visit (INDEPENDENT_AMBULATORY_CARE_PROVIDER_SITE_OTHER): Payer: Medicare Other | Admitting: Pharmacist Clinician (PhC)/ Clinical Pharmacy Specialist

## 2018-02-03 DIAGNOSIS — Z5181 Encounter for therapeutic drug level monitoring: Secondary | ICD-10-CM | POA: Diagnosis not present

## 2018-02-03 DIAGNOSIS — I63419 Cerebral infarction due to embolism of unspecified middle cerebral artery: Secondary | ICD-10-CM | POA: Diagnosis not present

## 2018-02-03 DIAGNOSIS — E039 Hypothyroidism, unspecified: Secondary | ICD-10-CM | POA: Diagnosis not present

## 2018-02-03 DIAGNOSIS — Z1231 Encounter for screening mammogram for malignant neoplasm of breast: Secondary | ICD-10-CM

## 2018-02-03 DIAGNOSIS — I480 Paroxysmal atrial fibrillation: Secondary | ICD-10-CM

## 2018-02-03 LAB — POCT INR: INR: 1.9 — AB (ref 2.0–3.0)

## 2018-02-15 DIAGNOSIS — Z23 Encounter for immunization: Secondary | ICD-10-CM | POA: Diagnosis not present

## 2018-02-16 LAB — CUP PACEART REMOTE DEVICE CHECK
Battery Remaining Longevity: 151 mo
Brady Statistic AP VS Percent: 94.66 %
Brady Statistic AS VP Percent: 0 %
Brady Statistic RA Percent Paced: 96.42 %
Date Time Interrogation Session: 20191021015838
Implantable Lead Implant Date: 20180914
Implantable Lead Location: 753860
Implantable Lead Model: 5076
Lead Channel Impedance Value: 323 Ohm
Lead Channel Pacing Threshold Pulse Width: 0.4 ms
Lead Channel Pacing Threshold Pulse Width: 0.4 ms
Lead Channel Sensing Intrinsic Amplitude: 11.375 mV
Lead Channel Sensing Intrinsic Amplitude: 3.375 mV
Lead Channel Sensing Intrinsic Amplitude: 3.375 mV
Lead Channel Setting Pacing Pulse Width: 0.4 ms
MDC IDC LEAD IMPLANT DT: 20180914
MDC IDC LEAD LOCATION: 753859
MDC IDC MSMT BATTERY VOLTAGE: 3.04 V
MDC IDC MSMT LEADCHNL RA IMPEDANCE VALUE: 456 Ohm
MDC IDC MSMT LEADCHNL RA PACING THRESHOLD AMPLITUDE: 0.625 V
MDC IDC MSMT LEADCHNL RV IMPEDANCE VALUE: 418 Ohm
MDC IDC MSMT LEADCHNL RV IMPEDANCE VALUE: 494 Ohm
MDC IDC MSMT LEADCHNL RV PACING THRESHOLD AMPLITUDE: 0.75 V
MDC IDC MSMT LEADCHNL RV SENSING INTR AMPL: 11.375 mV
MDC IDC PG IMPLANT DT: 20180914
MDC IDC SET LEADCHNL RA PACING AMPLITUDE: 1.5 V
MDC IDC SET LEADCHNL RV PACING AMPLITUDE: 2.5 V
MDC IDC SET LEADCHNL RV SENSING SENSITIVITY: 0.9 mV
MDC IDC STAT BRADY AP VP PERCENT: 1.22 %
MDC IDC STAT BRADY AS VS PERCENT: 4.12 %
MDC IDC STAT BRADY RV PERCENT PACED: 1.22 %

## 2018-02-24 ENCOUNTER — Other Ambulatory Visit: Payer: Self-pay | Admitting: Interventional Cardiology

## 2018-03-01 ENCOUNTER — Ambulatory Visit (INDEPENDENT_AMBULATORY_CARE_PROVIDER_SITE_OTHER): Payer: Medicare Other | Admitting: Pharmacist Clinician (PhC)/ Clinical Pharmacy Specialist

## 2018-03-01 DIAGNOSIS — Z5181 Encounter for therapeutic drug level monitoring: Secondary | ICD-10-CM

## 2018-03-01 DIAGNOSIS — I63419 Cerebral infarction due to embolism of unspecified middle cerebral artery: Secondary | ICD-10-CM

## 2018-03-01 DIAGNOSIS — I480 Paroxysmal atrial fibrillation: Secondary | ICD-10-CM | POA: Diagnosis not present

## 2018-03-01 DIAGNOSIS — E785 Hyperlipidemia, unspecified: Secondary | ICD-10-CM | POA: Diagnosis not present

## 2018-03-01 LAB — POCT INR: INR: 2.2 (ref 2.0–3.0)

## 2018-04-05 ENCOUNTER — Ambulatory Visit (INDEPENDENT_AMBULATORY_CARE_PROVIDER_SITE_OTHER): Payer: Medicare Other | Admitting: Pharmacist Clinician (PhC)/ Clinical Pharmacy Specialist

## 2018-04-05 DIAGNOSIS — I480 Paroxysmal atrial fibrillation: Secondary | ICD-10-CM

## 2018-04-05 DIAGNOSIS — Z5181 Encounter for therapeutic drug level monitoring: Secondary | ICD-10-CM | POA: Diagnosis not present

## 2018-04-05 LAB — POCT INR: INR: 2.2 (ref 2.0–3.0)

## 2018-04-05 NOTE — Patient Instructions (Signed)
Description   Continue taking 1.5 tablet daily except 1 tablets each Sunday, Tuesday and Thursday  Repeat INR in 3 weeks

## 2018-04-21 ENCOUNTER — Encounter: Payer: Self-pay | Admitting: Nurse Practitioner

## 2018-04-21 NOTE — Progress Notes (Signed)
Electrophysiology Office Note Date: 04/22/2018  ID:  [name], DOB Jun 28, 1945, MRN [id number]  PCP: Aura Dials, MD Primary Cardiologist: Tamala Julian Electrophysiologist: Allred  CC: Pacemaker follow-up  [name] is a 73 y.o. female seen today for Dr Rayann Heman.  She presents today for routine electrophysiology followup.  Since last being seen in our clinic, the patient reports doing relatively well.  She has had intermittent palpitations that last for seconds and then self terminate.  She denies chest pain, dyspnea, PND, orthopnea, nausea, vomiting, dizziness, syncope, edema, weight gain, or early satiety.  Device History: MDT dual chamber PPM implanted 2018 for SSS   Past Medical History:  Diagnosis Date  . Acoustic neuroma (HCC)    Hx of right ear acoustic neuroma, removed in 1999. No hearing in right ear.  [name] Anxiety    Hx of, responds to Wellbutrin  . Basal cell carcinoma    s/p MOHS surgery in 03/2011  . Cataract   . Chronic anticoagulation 11/24/2016  . Chronic cholecystitis with calculus 05/13/2013  . Closed fracture of left lateral malleolus 08/10/2017  . COPD (chronic obstructive pulmonary disease) ([place])   . Deafness in right ear   . Embolic stroke ([place]) 0/51/1021  . Family history of adverse reaction to anesthesia    Family hx of PONV  . Fracture    left lateral malleolus fracture  . GERD (gastroesophageal reflux disease)   . Headache(784.0)    MIGRAINES  . Hearing loss    Only in right ear, from an acoustic neuroma. S/P removal in 1999.   [name] History of hiatal hernia   . History of neck pain    Responds to Flexeril  . History of shingles   . Hypercholesteremia   . Hypertension   . Hypothyroidism   . PAF (paroxysmal atrial fibrillation) ([place])    2 brief episodes of Afib recorded on monitor 02/2007  . Paroxysmal atrial fibrillation (HCC)   . PONV (postoperative nausea and vomiting)   . Presence of permanent cardiac pacemaker   . Sick sinus  syndrome ([place]) 12/19/2016  . Sinus bradycardia   . Tachycardia-bradycardia syndrome ([place]) 11/24/2016  . TIA (transient ischemic attack) 2011   On coumadin. Asymptomatic, without recurrence.   Past Surgical History:  Procedure Laterality Date  . ABDOMINAL HYSTERECTOMY    . APPENDECTOMY    . BREAST BIOPSY     X 3  . BREAST CYST ASPIRATION Left 50 yrs ago per pt  . BREAST EXCISIONAL BIOPSY Left 50 yrs ago  . CHOLECYSTECTOMY N/A 05/26/2013   Procedure: LAPAROSCOPIC CHOLECYSTECTOMY WITH INTRAOPERATIVE CHOLANGIOGRAM;  Surgeon: Imogene Burn. Georgette Dover, MD;  Location: [facility];  Service: General;  Laterality: N/A;  . CRANIECTOMY FOR EXCISION OF ACOUSTIC NEUROMA Right 1999  . INTRAOCULAR LENS INSERTION     Dr. Talbert Forest  . MOHS SURGERY  03/2011   Basal cell skin cancer  . ORIF ANKLE FRACTURE Left 08/10/2017   Procedure: OPEN REDUCTION INTERNAL FIXATION (ORIF) LEFT ANKLE FRACTURE;  Surgeon: Rod Can, MD;  Location: [facility];  Service: Orthopedics;  Laterality: Left;  . PACEMAKER IMPLANT N/A 12/19/2016   Medtronic Azure XT MRI conditional dual-chamber pacemaker for symptomatic sinus bradycardia by Dr Rayann Heman  . TONSILLECTOMY      Current Outpatient Medications  Medication Sig Dispense Refill  . clonazePAM (KLONOPIN) 0.5 MG tablet Take 0.5 mg by mouth daily.     . flecainide (TAMBOCOR) 100 MG tablet Take 200 mg by mouth daily as needed (Take one (1)  hour after Toprol if still in AFIB.).    [name] FLUoxetine (PROZAC) 10 MG capsule Take 10 mg by mouth daily with breakfast. Take with 20 mg to equal 30 mg daily.    [name] FLUoxetine (PROZAC) 20 MG capsule Take 20 mg by mouth daily with breakfast. Take with 10 mg to equal 30 mg daily.    [name] ketotifen (ALAWAY) 0.025 % ophthalmic solution Place 1-2 drops into both eyes 2 (two) times daily as needed (for allergies).    [name] levothyroxine (SYNTHROID, LEVOTHROID) 88 MCG tablet Take 88 mcg by mouth daily before breakfast.    . loratadine (CLARITIN) 10 MG tablet Take 10 mg by mouth  daily as needed for allergies.    . metoprolol succinate (TOPROL-XL) 25 MG 24 hr tablet Take 12.5 mg by mouth daily as needed (For recurrent AFIB).    . Multiple Vitamin (MULTIVITAMIN) tablet Take 1 tablet by mouth daily.    . pantoprazole (PROTONIX) 40 MG tablet Take 40 mg by mouth daily as needed (acid reflux).     . warfarin (COUMADIN) 2.5 MG tablet TAKE 1-2 TABLETS BY MOUTH DAILY AS DIRECTED BY COUMADIN CLINIC. 120 tablet 0   No current facility-administered medications for this visit.     Allergies:   Atorvastatin; Statins; Codeine; Latex; and Penicillins   Social History: Social History   Socioeconomic History  . Marital status: Married    Spouse name: [name]  . Number of children: 3  . Years of education: masters  . Highest education level: Not on file  Occupational History  . Occupation: retired    Comment: [employer]  Social Needs  . Financial resource strain: Not on file  . Food insecurity:    Worry: Not on file    Inability: Not on file  . Transportation needs:    Medical: Not on file    Non-medical: Not on file  Tobacco Use  . Smoking status: Never Smoker  . Smokeless tobacco: Never Used  Substance and Sexual Activity  . Alcohol use: Yes    Comment: Rare  . Drug use: No  . Sexual activity: Never  Lifestyle  . Physical activity:    Days per week: Not on file    Minutes per session: Not on file  . Stress: Not on file  Relationships  . Social connections:    Talks on phone: Not on file    Gets together: Not on file    Attends religious service: Not on file    Active member of club or organization: Not on file    Attends meetings of clubs or organizations: Not on file    Relationship status: Not on file  . Intimate partner violence:    Fear of current or ex partner: Not on file    Emotionally abused: Not on file    Physically abused: Not on file    Forced sexual activity: Not on file  Other Topics Concern  . Not on file  Social History Narrative   Pt  lives with spouse.    Caffeine Use: 1-2 glasses per week.    Family History: Family History  Problem Relation Age of Onset  . Alcoholism Father   . Liver cancer Father   . Diabetes Mother   . High Cholesterol Mother   . Hypertension Mother   . Memory loss Mother   . Healthy Sister   . Breast cancer Maternal Aunt   . Brain cancer Maternal Aunt   . Cancer Maternal Grandmother  Unknown type  . Hypertension Other        paternal & maternal side with HTN, all lived into their 7s  . Heart disease Other        paternal & maternal side with heart disease, all lived into their 48s  . Hyperlipidemia Other        paternal & maternal side with hyperlipidemia, all lived into their 82s  . CVA Other        paternal & maternal side with strokes, all lived into their 67s     Review of Systems: All other systems reviewed and are otherwise negative except as noted above.   Physical Exam: VS:  BP 92/70   Pulse 68   Ht 5\' 5"  (1.651 m)   Wt 147 lb 12.8 oz (67 kg)   SpO2 99%   BMI 24.60 kg/m  , BMI Body mass index is 24.6 kg/m.  GEN- The patient is well appearing, alert and oriented x 3 today.   HEENT: normocephalic, atraumatic; sclera clear, conjunctiva pink; hearing intact; oropharynx clear; neck supple  Lungs- Clear to ausculation bilaterally, normal work of breathing.  No wheezes, rales, rhonchi Heart- Regular rate and rhythm  GI- soft, non-tender, non-distended, bowel sounds present  Extremities- no clubbing, cyanosis, or edema  MS- no significant deformity or atrophy Skin- warm and dry, no rash or lesion; PPM pocket well healed Psych- euthymic mood, full affect Neuro- strength and sensation are intact  PPM Interrogation- reviewed in detail today,  See PACEART report  EKG:  EKG is not ordered today.  Recent Labs: 07/30/2017: BUN 15; Potassium 5.2; Sodium 140 08/10/2017: Creatinine, Ser 0.94; Hemoglobin 11.7; Platelets 275   Wt Readings from Last 3 Encounters:    04/22/18 147 lb 12.8 oz (67 kg)  08/10/17 145 lb (65.8 kg)  07/30/17 145 lb (65.8 kg)     Other studies Reviewed: Additional studies/ records that were reviewed today include: Dr Rayann Heman and Dr Thompson Caul office notes   Assessment and Plan:  1.  Symptomatic sinus bradycardia Normal PPM function See Pace Art report No changes today  2.  Paroxysmal atrial fibrillation Burden by device interrogation <1% Continue [place] for CHADS2VASC of 5 No bleeding issues  Continue prn Flecainide  She does have short episodes of atrial tachycardia that are asymptomatic. Because of relative hypotension at baseline, and very short episodes (<4 seconds), will not start meds today   Current medicines are reviewed at length with the patient today.   The patient does not have concerns regarding her medicines.  The following changes were made today:  none  Labs/ tests ordered today include: none Orders Placed This Encounter  Procedures  . CUP PACEART INCLINIC DEVICE CHECK     Disposition:   Follow up with [facility], me in 1 year     Signed, Chanetta Marshall, NP 04/22/2018 11:06 AM  [address] [place] [address] [phone] (office) [phone] (fax)

## 2018-04-22 ENCOUNTER — Encounter: Payer: Self-pay | Admitting: Nurse Practitioner

## 2018-04-22 ENCOUNTER — Ambulatory Visit (INDEPENDENT_AMBULATORY_CARE_PROVIDER_SITE_OTHER): Payer: Medicare Other | Admitting: Nurse Practitioner

## 2018-04-22 ENCOUNTER — Ambulatory Visit (INDEPENDENT_AMBULATORY_CARE_PROVIDER_SITE_OTHER): Payer: Medicare Other | Admitting: Pharmacist

## 2018-04-22 VITALS — BP 92/70 | HR 68 | Ht 65.0 in | Wt 147.8 lb

## 2018-04-22 DIAGNOSIS — I480 Paroxysmal atrial fibrillation: Secondary | ICD-10-CM

## 2018-04-22 DIAGNOSIS — I495 Sick sinus syndrome: Secondary | ICD-10-CM | POA: Diagnosis not present

## 2018-04-22 DIAGNOSIS — Z5181 Encounter for therapeutic drug level monitoring: Secondary | ICD-10-CM | POA: Diagnosis not present

## 2018-04-22 LAB — CUP PACEART INCLINIC DEVICE CHECK
Date Time Interrogation Session: 20200116104459
Implantable Lead Implant Date: 20180914
Implantable Lead Location: 753859
Implantable Lead Location: 753860
Implantable Pulse Generator Implant Date: 20180914
MDC IDC LEAD IMPLANT DT: 20180914

## 2018-04-22 LAB — POCT INR: INR: 2.2 (ref 2.0–3.0)

## 2018-04-22 NOTE — Patient Instructions (Addendum)
Medication Instructions:  none If you need a refill on your cardiac medications before your next appointment, please call your pharmacy.   Lab work: none If you have labs (blood work) drawn today and your tests are completely normal, you will receive your results only by: [name] MyChart Message (if you have MyChart) OR . A paper copy in the mail If you have any lab test that is abnormal or we need to change your treatment, we will call you to review the results.  Testing/Procedures: none  Follow-Up: Museum/gallery conservator in 1 year At [facility], you and your health needs are our priority.  As part of our continuing mission to provide you with exceptional heart care, we have created designated Provider Care Teams.  These Care Teams include your primary Cardiologist (physician) and Advanced Practice Providers (APPs -  Physician Assistants and Nurse Practitioners) who all work together to provide you with the care you need, when you need it.  Any Other Special Instructions Will Be Listed Below (If Applicable).

## 2018-04-22 NOTE — Patient Instructions (Signed)
Description   Continue taking 1.5 tablet daily except 1 tablets each Sunday, Tuesday and Thursday. Repeat INR in 3 weeks.

## 2018-04-26 ENCOUNTER — Ambulatory Visit (INDEPENDENT_AMBULATORY_CARE_PROVIDER_SITE_OTHER): Payer: Medicare Other

## 2018-04-26 DIAGNOSIS — I495 Sick sinus syndrome: Secondary | ICD-10-CM

## 2018-04-27 DIAGNOSIS — Z872 Personal history of diseases of the skin and subcutaneous tissue: Secondary | ICD-10-CM | POA: Diagnosis not present

## 2018-04-27 DIAGNOSIS — D1801 Hemangioma of skin and subcutaneous tissue: Secondary | ICD-10-CM | POA: Diagnosis not present

## 2018-04-27 DIAGNOSIS — D225 Melanocytic nevi of trunk: Secondary | ICD-10-CM | POA: Diagnosis not present

## 2018-04-27 DIAGNOSIS — L814 Other melanin hyperpigmentation: Secondary | ICD-10-CM | POA: Diagnosis not present

## 2018-04-27 DIAGNOSIS — L989 Disorder of the skin and subcutaneous tissue, unspecified: Secondary | ICD-10-CM | POA: Diagnosis not present

## 2018-04-27 DIAGNOSIS — R238 Other skin changes: Secondary | ICD-10-CM | POA: Diagnosis not present

## 2018-04-27 DIAGNOSIS — L821 Other seborrheic keratosis: Secondary | ICD-10-CM | POA: Diagnosis not present

## 2018-04-27 DIAGNOSIS — D485 Neoplasm of uncertain behavior of skin: Secondary | ICD-10-CM | POA: Diagnosis not present

## 2018-04-27 DIAGNOSIS — D0361 Melanoma in situ of right upper limb, including shoulder: Secondary | ICD-10-CM | POA: Diagnosis not present

## 2018-04-27 LAB — CUP PACEART REMOTE DEVICE CHECK
Brady Statistic AP VS Percent: 94.03 %
Brady Statistic AS VP Percent: 0 %
Brady Statistic AS VS Percent: 4.97 %
Brady Statistic RV Percent Paced: 1 %
Implantable Lead Location: 753859
Implantable Lead Model: 5076
Implantable Lead Model: 5076
Lead Channel Impedance Value: 323 Ohm
Lead Channel Impedance Value: 399 Ohm
Lead Channel Impedance Value: 437 Ohm
Lead Channel Pacing Threshold Amplitude: 0.75 V
Lead Channel Pacing Threshold Pulse Width: 0.4 ms
Lead Channel Sensing Intrinsic Amplitude: 11.625 mV
Lead Channel Sensing Intrinsic Amplitude: 11.625 mV
Lead Channel Sensing Intrinsic Amplitude: 3.25 mV
Lead Channel Setting Pacing Amplitude: 1.5 V
Lead Channel Setting Pacing Amplitude: 2.5 V
Lead Channel Setting Pacing Pulse Width: 0.4 ms
MDC IDC LEAD IMPLANT DT: 20180914
MDC IDC LEAD IMPLANT DT: 20180914
MDC IDC LEAD LOCATION: 753860
MDC IDC MSMT BATTERY REMAINING LONGEVITY: 147 mo
MDC IDC MSMT BATTERY VOLTAGE: 3.03 V
MDC IDC MSMT LEADCHNL RA PACING THRESHOLD AMPLITUDE: 0.625 V
MDC IDC MSMT LEADCHNL RA PACING THRESHOLD PULSEWIDTH: 0.4 ms
MDC IDC MSMT LEADCHNL RA SENSING INTR AMPL: 3.25 mV
MDC IDC MSMT LEADCHNL RV IMPEDANCE VALUE: 494 Ohm
MDC IDC PG IMPLANT DT: 20180914
MDC IDC SESS DTM: 20200120040158
MDC IDC SET LEADCHNL RV SENSING SENSITIVITY: 0.9 mV
MDC IDC STAT BRADY AP VP PERCENT: 0.99 %
MDC IDC STAT BRADY RA PERCENT PACED: 95.75 %

## 2018-04-27 NOTE — Progress Notes (Signed)
Remote pacemaker transmission.   

## 2018-05-10 DIAGNOSIS — D0361 Melanoma in situ of right upper limb, including shoulder: Secondary | ICD-10-CM | POA: Diagnosis not present

## 2018-05-10 DIAGNOSIS — L905 Scar conditions and fibrosis of skin: Secondary | ICD-10-CM | POA: Diagnosis not present

## 2018-05-12 ENCOUNTER — Ambulatory Visit (INDEPENDENT_AMBULATORY_CARE_PROVIDER_SITE_OTHER): Payer: Medicare Other | Admitting: Pharmacist

## 2018-05-12 DIAGNOSIS — Z5181 Encounter for therapeutic drug level monitoring: Secondary | ICD-10-CM

## 2018-05-12 DIAGNOSIS — I480 Paroxysmal atrial fibrillation: Secondary | ICD-10-CM | POA: Diagnosis not present

## 2018-05-12 LAB — POCT INR: INR: 2.1 (ref 2.0–3.0)

## 2018-05-13 ENCOUNTER — Other Ambulatory Visit: Payer: Self-pay | Admitting: Interventional Cardiology

## 2018-06-14 ENCOUNTER — Ambulatory Visit (INDEPENDENT_AMBULATORY_CARE_PROVIDER_SITE_OTHER): Payer: Medicare Other | Admitting: *Deleted

## 2018-06-14 DIAGNOSIS — Z5181 Encounter for therapeutic drug level monitoring: Secondary | ICD-10-CM

## 2018-06-14 DIAGNOSIS — I480 Paroxysmal atrial fibrillation: Secondary | ICD-10-CM

## 2018-06-14 LAB — POCT INR: INR: 3.1 — AB (ref 2.0–3.0)

## 2018-06-14 NOTE — Patient Instructions (Signed)
Description   Today only take 1/2 tablet, then Continue taking 1.5 tablet daily except 1 tablets each Sunday, Tuesday and Thursday. Repeat INR in 4 weeks.

## 2018-07-08 ENCOUNTER — Telehealth: Payer: Self-pay

## 2018-07-08 NOTE — Telephone Encounter (Signed)
lmom for prescreen/drive thru 

## 2018-07-08 NOTE — Telephone Encounter (Signed)

## 2018-07-12 ENCOUNTER — Ambulatory Visit (INDEPENDENT_AMBULATORY_CARE_PROVIDER_SITE_OTHER): Payer: Medicare Other | Admitting: Pharmacist

## 2018-07-12 ENCOUNTER — Other Ambulatory Visit: Payer: Self-pay

## 2018-07-12 DIAGNOSIS — Z5181 Encounter for therapeutic drug level monitoring: Secondary | ICD-10-CM

## 2018-07-12 DIAGNOSIS — I480 Paroxysmal atrial fibrillation: Secondary | ICD-10-CM | POA: Diagnosis not present

## 2018-07-12 LAB — POCT INR: INR: 3.2 — AB (ref 2.0–3.0)

## 2018-07-12 MED ORDER — APIXABAN 5 MG PO TABS: 5.0000 mg | ORAL_TABLET | Freq: Two times a day (BID) | ORAL | 5 refills | Status: DC

## 2018-07-12 NOTE — Progress Notes (Signed)
435 473.38 then will be 47/mo

## 2018-07-26 ENCOUNTER — Other Ambulatory Visit: Payer: Self-pay

## 2018-07-26 ENCOUNTER — Ambulatory Visit (INDEPENDENT_AMBULATORY_CARE_PROVIDER_SITE_OTHER): Payer: Medicare Other | Admitting: *Deleted

## 2018-07-26 DIAGNOSIS — I495 Sick sinus syndrome: Secondary | ICD-10-CM | POA: Diagnosis not present

## 2018-07-26 LAB — CUP PACEART REMOTE DEVICE CHECK
Battery Remaining Longevity: 146 mo
Battery Voltage: 3.03 V
Brady Statistic AP VP Percent: 0.78 %
Brady Statistic AP VS Percent: 95.66 %
Brady Statistic AS VP Percent: 0 %
Brady Statistic AS VS Percent: 3.55 %
Brady Statistic RA Percent Paced: 96.8 %
Brady Statistic RV Percent Paced: 0.79 %
Date Time Interrogation Session: 20200420030053
Implantable Lead Implant Date: 20180914
Implantable Lead Implant Date: 20180914
Implantable Lead Location: 753859
Implantable Lead Location: 753860
Implantable Lead Model: 5076
Implantable Lead Model: 5076
Implantable Pulse Generator Implant Date: 20180914
Lead Channel Impedance Value: 323 Ohm
Lead Channel Impedance Value: 418 Ohm
Lead Channel Impedance Value: 475 Ohm
Lead Channel Impedance Value: 513 Ohm
Lead Channel Pacing Threshold Amplitude: 0.625 V
Lead Channel Pacing Threshold Amplitude: 0.75 V
Lead Channel Pacing Threshold Pulse Width: 0.4 ms
Lead Channel Pacing Threshold Pulse Width: 0.4 ms
Lead Channel Sensing Intrinsic Amplitude: 12.5 mV
Lead Channel Sensing Intrinsic Amplitude: 12.5 mV
Lead Channel Sensing Intrinsic Amplitude: 3.75 mV
Lead Channel Sensing Intrinsic Amplitude: 3.75 mV
Lead Channel Setting Pacing Amplitude: 1.5 V
Lead Channel Setting Pacing Amplitude: 2.5 V
Lead Channel Setting Pacing Pulse Width: 0.4 ms
Lead Channel Setting Sensing Sensitivity: 0.9 mV

## 2018-08-02 ENCOUNTER — Telehealth: Payer: Self-pay | Admitting: Interventional Cardiology

## 2018-08-02 NOTE — Telephone Encounter (Signed)
Returned call to pt and moved INR appt to this Friday per pt request.

## 2018-08-02 NOTE — Telephone Encounter (Signed)
Patient would like to know if she can have her appt moved from 5/11 to this week.  She is having transportation issues.

## 2018-08-03 ENCOUNTER — Encounter: Payer: Self-pay | Admitting: Cardiology

## 2018-08-03 NOTE — Progress Notes (Signed)
Remote pacemaker transmission.   

## 2018-08-05 ENCOUNTER — Telehealth: Payer: Self-pay

## 2018-08-05 NOTE — Telephone Encounter (Signed)
lmom for prescreen  

## 2018-08-05 NOTE — Telephone Encounter (Signed)

## 2018-08-06 ENCOUNTER — Other Ambulatory Visit: Payer: Self-pay

## 2018-08-06 ENCOUNTER — Ambulatory Visit (INDEPENDENT_AMBULATORY_CARE_PROVIDER_SITE_OTHER): Payer: Medicare Other | Admitting: *Deleted

## 2018-08-06 DIAGNOSIS — Z5181 Encounter for therapeutic drug level monitoring: Secondary | ICD-10-CM

## 2018-08-06 DIAGNOSIS — I480 Paroxysmal atrial fibrillation: Secondary | ICD-10-CM

## 2018-08-06 LAB — POCT INR: INR: 2.3 (ref 2.0–3.0)

## 2018-08-23 DIAGNOSIS — L910 Hypertrophic scar: Secondary | ICD-10-CM | POA: Diagnosis not present

## 2018-08-23 DIAGNOSIS — L570 Actinic keratosis: Secondary | ICD-10-CM | POA: Diagnosis not present

## 2018-08-23 DIAGNOSIS — D485 Neoplasm of uncertain behavior of skin: Secondary | ICD-10-CM | POA: Diagnosis not present

## 2018-08-23 DIAGNOSIS — L989 Disorder of the skin and subcutaneous tissue, unspecified: Secondary | ICD-10-CM | POA: Diagnosis not present

## 2018-08-23 DIAGNOSIS — L814 Other melanin hyperpigmentation: Secondary | ICD-10-CM | POA: Diagnosis not present

## 2018-08-23 DIAGNOSIS — Z85820 Personal history of malignant melanoma of skin: Secondary | ICD-10-CM | POA: Diagnosis not present

## 2018-08-23 DIAGNOSIS — D1801 Hemangioma of skin and subcutaneous tissue: Secondary | ICD-10-CM | POA: Diagnosis not present

## 2018-08-23 DIAGNOSIS — B351 Tinea unguium: Secondary | ICD-10-CM | POA: Diagnosis not present

## 2018-08-23 DIAGNOSIS — D225 Melanocytic nevi of trunk: Secondary | ICD-10-CM | POA: Diagnosis not present

## 2018-08-23 DIAGNOSIS — L821 Other seborrheic keratosis: Secondary | ICD-10-CM | POA: Diagnosis not present

## 2018-08-23 DIAGNOSIS — L820 Inflamed seborrheic keratosis: Secondary | ICD-10-CM | POA: Diagnosis not present

## 2018-09-02 DIAGNOSIS — H35373 Puckering of macula, bilateral: Secondary | ICD-10-CM | POA: Diagnosis not present

## 2018-09-02 DIAGNOSIS — H26491 Other secondary cataract, right eye: Secondary | ICD-10-CM | POA: Diagnosis not present

## 2018-09-07 ENCOUNTER — Telehealth: Payer: Self-pay

## 2018-09-07 NOTE — Telephone Encounter (Signed)

## 2018-09-09 ENCOUNTER — Other Ambulatory Visit: Payer: Self-pay | Admitting: Interventional Cardiology

## 2018-09-10 ENCOUNTER — Other Ambulatory Visit: Payer: Self-pay

## 2018-09-10 ENCOUNTER — Ambulatory Visit (INDEPENDENT_AMBULATORY_CARE_PROVIDER_SITE_OTHER): Payer: Medicare Other | Admitting: *Deleted

## 2018-09-10 DIAGNOSIS — Z5181 Encounter for therapeutic drug level monitoring: Secondary | ICD-10-CM | POA: Diagnosis not present

## 2018-09-10 DIAGNOSIS — I480 Paroxysmal atrial fibrillation: Secondary | ICD-10-CM

## 2018-09-10 LAB — POCT INR: INR: 2.4 (ref 2.0–3.0)

## 2018-09-10 NOTE — Patient Instructions (Signed)
Description   Continue taking  1 tablet daily except 1.5 tablets each Mondays,  Wednesdays, and Fridays. Repeat INR in 5 weeks.

## 2018-10-11 ENCOUNTER — Telehealth: Payer: Self-pay

## 2018-10-11 NOTE — Telephone Encounter (Signed)

## 2018-10-15 ENCOUNTER — Other Ambulatory Visit: Payer: Self-pay

## 2018-10-15 ENCOUNTER — Ambulatory Visit (INDEPENDENT_AMBULATORY_CARE_PROVIDER_SITE_OTHER): Payer: Medicare Other | Admitting: *Deleted

## 2018-10-15 DIAGNOSIS — Z5181 Encounter for therapeutic drug level monitoring: Secondary | ICD-10-CM | POA: Diagnosis not present

## 2018-10-15 DIAGNOSIS — I480 Paroxysmal atrial fibrillation: Secondary | ICD-10-CM

## 2018-10-15 LAB — POCT INR: INR: 2.7 (ref 2.0–3.0)

## 2018-10-15 NOTE — Patient Instructions (Signed)
Description   Continue taking  1 tablet daily except 1.5 tablets each Mondays,  Wednesdays, and Fridays. Repeat INR in 4 weeks.

## 2018-10-25 ENCOUNTER — Ambulatory Visit (INDEPENDENT_AMBULATORY_CARE_PROVIDER_SITE_OTHER): Payer: Medicare Other | Admitting: *Deleted

## 2018-10-25 DIAGNOSIS — I495 Sick sinus syndrome: Secondary | ICD-10-CM | POA: Diagnosis not present

## 2018-10-25 LAB — CUP PACEART REMOTE DEVICE CHECK
Battery Remaining Longevity: 141 mo
Battery Voltage: 3.02 V
Brady Statistic AP VP Percent: 1.06 %
Brady Statistic AP VS Percent: 96.13 %
Brady Statistic AS VP Percent: 0 %
Brady Statistic AS VS Percent: 2.81 %
Brady Statistic RA Percent Paced: 97.72 %
Brady Statistic RV Percent Paced: 1.06 %
Date Time Interrogation Session: 20200720073028
Implantable Lead Implant Date: 20180914
Implantable Lead Implant Date: 20180914
Implantable Lead Location: 753859
Implantable Lead Location: 753860
Implantable Lead Model: 5076
Implantable Lead Model: 5076
Implantable Pulse Generator Implant Date: 20180914
Lead Channel Impedance Value: 323 Ohm
Lead Channel Impedance Value: 399 Ohm
Lead Channel Impedance Value: 437 Ohm
Lead Channel Impedance Value: 494 Ohm
Lead Channel Pacing Threshold Amplitude: 0.625 V
Lead Channel Pacing Threshold Amplitude: 0.75 V
Lead Channel Pacing Threshold Pulse Width: 0.4 ms
Lead Channel Pacing Threshold Pulse Width: 0.4 ms
Lead Channel Sensing Intrinsic Amplitude: 11.375 mV
Lead Channel Sensing Intrinsic Amplitude: 11.375 mV
Lead Channel Sensing Intrinsic Amplitude: 3.125 mV
Lead Channel Sensing Intrinsic Amplitude: 3.125 mV
Lead Channel Setting Pacing Amplitude: 1.5 V
Lead Channel Setting Pacing Amplitude: 2.5 V
Lead Channel Setting Pacing Pulse Width: 0.4 ms
Lead Channel Setting Sensing Sensitivity: 0.9 mV

## 2018-10-28 DIAGNOSIS — L820 Inflamed seborrheic keratosis: Secondary | ICD-10-CM | POA: Diagnosis not present

## 2018-10-28 DIAGNOSIS — L905 Scar conditions and fibrosis of skin: Secondary | ICD-10-CM | POA: Diagnosis not present

## 2018-10-28 DIAGNOSIS — L910 Hypertrophic scar: Secondary | ICD-10-CM | POA: Diagnosis not present

## 2018-11-08 ENCOUNTER — Encounter: Payer: Self-pay | Admitting: Cardiology

## 2018-11-08 NOTE — Progress Notes (Signed)
Remote pacemaker transmission.   

## 2018-11-12 ENCOUNTER — Other Ambulatory Visit: Payer: Self-pay

## 2018-11-12 ENCOUNTER — Ambulatory Visit (INDEPENDENT_AMBULATORY_CARE_PROVIDER_SITE_OTHER): Payer: Medicare Other | Admitting: Pharmacist Clinician (PhC)/ Clinical Pharmacy Specialist

## 2018-11-12 DIAGNOSIS — Z5181 Encounter for therapeutic drug level monitoring: Secondary | ICD-10-CM

## 2018-11-12 DIAGNOSIS — I480 Paroxysmal atrial fibrillation: Secondary | ICD-10-CM | POA: Diagnosis not present

## 2018-11-12 LAB — POCT INR: INR: 2.1 (ref 2.0–3.0)

## 2018-12-24 ENCOUNTER — Other Ambulatory Visit: Payer: Self-pay

## 2018-12-24 ENCOUNTER — Ambulatory Visit (INDEPENDENT_AMBULATORY_CARE_PROVIDER_SITE_OTHER): Payer: Medicare Other | Admitting: Pharmacist Clinician (PhC)/ Clinical Pharmacy Specialist

## 2018-12-24 DIAGNOSIS — Z5181 Encounter for therapeutic drug level monitoring: Secondary | ICD-10-CM

## 2018-12-24 DIAGNOSIS — I480 Paroxysmal atrial fibrillation: Secondary | ICD-10-CM | POA: Diagnosis not present

## 2018-12-24 LAB — POCT INR: INR: 1.7 — AB (ref 2.0–3.0)

## 2019-01-11 NOTE — Progress Notes (Signed)
Cardiology Office Note:    Date:  01/12/2019   ID:  [name], DOB [birth year], MRN [id number]  PCP:  Aura Dials, MD  Cardiologist:  No primary care provider on file.   Referring MD: Aura Dials, MD   Chief Complaint  Patient presents with  . Atrial Fibrillation  . Advice Only    Pacemaker    History of Present Illness:    [name] is a 73 y.o. female with a hx of paroxysmal atrial fibrillation, embolic CVA which led to the diagnosis of atrial fibrillation, chronic Coumadin anticoagulation, subsequent development of tachycardia bradycardia syndrome, ultimately treated with DDD pacemaker 12/20/2016.  [name] is doing well.  She has not been as active as previous due to COVID 19 pandemic.  She has had no significant palpitations or racing heart.  No syncope, chest pain, edema, orthopnea, PND, or other complaints.  She has not fainted.  She has not had bleeding.  There have been no transient neurological complaints.  Past Medical History:  Diagnosis Date  . Acoustic neuroma (HCC)    Hx of right ear acoustic neuroma, removed in 1999. No hearing in right ear.  [name] Anxiety    Hx of, responds to Wellbutrin  . Basal cell carcinoma    s/p MOHS surgery in 03/2011  . Cataract   . Chronic anticoagulation 11/24/2016  . Chronic cholecystitis with calculus 05/13/2013  . Closed fracture of left lateral malleolus 08/10/2017  . COPD (chronic obstructive pulmonary disease) ([place])   . Deafness in right ear   . Embolic stroke ([place]) [id number]  . Family history of adverse reaction to anesthesia    Family hx of PONV  . Fracture    left lateral malleolus fracture  . GERD (gastroesophageal reflux disease)   . Headache(784.0)    MIGRAINES  . Hearing loss    Only in right ear, from an acoustic neuroma. S/P removal in 1999.   [name] History of hiatal hernia   . History of neck pain    Responds to Flexeril  . History of shingles   . Hypercholesteremia   . Hypertension   .  Hypothyroidism   . PAF (paroxysmal atrial fibrillation) ([place])    2 brief episodes of Afib recorded on monitor 02/2007  . Paroxysmal atrial fibrillation (HCC)   . PONV (postoperative nausea and vomiting)   . Presence of permanent cardiac pacemaker   . Sick sinus syndrome ([place]) 12/19/2016  . Sinus bradycardia   . Tachycardia-bradycardia syndrome ([place]) 11/24/2016  . TIA (transient ischemic attack) 2011   On coumadin. Asymptomatic, without recurrence.    Past Surgical History:  Procedure Laterality Date  . ABDOMINAL HYSTERECTOMY    . APPENDECTOMY    . BREAST BIOPSY     X 3  . BREAST CYST ASPIRATION Left 50 yrs ago per pt  . BREAST EXCISIONAL BIOPSY Left 50 yrs ago  . CHOLECYSTECTOMY N/A 05/26/2013   Procedure: LAPAROSCOPIC CHOLECYSTECTOMY WITH INTRAOPERATIVE CHOLANGIOGRAM;  Surgeon: Imogene Burn. Georgette Dover, MD;  Location: [facility];  Service: General;  Laterality: N/A;  . CRANIECTOMY FOR EXCISION OF ACOUSTIC NEUROMA Right 1999  . INTRAOCULAR LENS INSERTION     Dr. Talbert Forest  . MOHS SURGERY  03/2011   Basal cell skin cancer  . ORIF ANKLE FRACTURE Left 08/10/2017   Procedure: OPEN REDUCTION INTERNAL FIXATION (ORIF) LEFT ANKLE FRACTURE;  Surgeon: Rod Can, MD;  Location: [facility];  Service: Orthopedics;  Laterality: Left;  . PACEMAKER IMPLANT N/A 12/19/2016   Medtronic Azure XT  MRI conditional dual-chamber pacemaker for symptomatic sinus bradycardia by Dr Rayann Heman  . TONSILLECTOMY      Current Medications: Current Meds  Medication Sig  . clonazePAM (KLONOPIN) 0.5 MG tablet Take 0.5 mg by mouth daily.   . flecainide (TAMBOCOR) 100 MG tablet Take 200 mg by mouth daily as needed (Take one (1) hour after Toprol if still in AFIB.).  [name] FLUoxetine (PROZAC) 10 MG capsule Take 10 mg by mouth daily with breakfast. Take with 20 mg to equal 30 mg daily.  [name] FLUoxetine (PROZAC) 20 MG capsule Take 20 mg by mouth daily with breakfast. Take with 10 mg to equal 30 mg daily.  [name] ketotifen (ALAWAY) 0.025 % ophthalmic  solution Place 1-2 drops into both eyes 2 (two) times daily as needed (for allergies).  [name] levothyroxine (SYNTHROID, LEVOTHROID) 88 MCG tablet Take 88 mcg by mouth daily before breakfast.  . loratadine (CLARITIN) 10 MG tablet Take 10 mg by mouth daily as needed for allergies.  . metoprolol succinate (TOPROL-XL) 25 MG 24 hr tablet Take 12.5 mg by mouth daily as needed (For recurrent AFIB).  . pantoprazole (PROTONIX) 40 MG tablet Take 40 mg by mouth daily as needed (acid reflux).   . warfarin (COUMADIN) 2.5 MG tablet TAKE 1-2 TABLETS BY MOUTH DAILY AS DIRECTED BY COUMADIN CLINIC.     Allergies:   Atorvastatin, Statins, Codeine, Latex, and Penicillins   Social History   Socioeconomic History  . Marital status: Married    Spouse name: [name]  . Number of children: 3  . Years of education: masters  . Highest education level: Not on file  Occupational History  . Occupation: retired    Comment: [employer]  Social Needs  . Financial resource strain: Not on file  . Food insecurity    Worry: Not on file    Inability: Not on file  . Transportation needs    Medical: Not on file    Non-medical: Not on file  Tobacco Use  . Smoking status: Never Smoker  . Smokeless tobacco: Never Used  Substance and Sexual Activity  . Alcohol use: Yes    Comment: Rare  . Drug use: No  . Sexual activity: Never  Lifestyle  . Physical activity    Days per week: Not on file    Minutes per session: Not on file  . Stress: Not on file  Relationships  . Social Herbalist on phone: Not on file    Gets together: Not on file    Attends religious service: Not on file    Active member of club or organization: Not on file    Attends meetings of clubs or organizations: Not on file    Relationship status: Not on file  Other Topics Concern  . Not on file  Social History Narrative   Pt lives with spouse.    Caffeine Use: 1-2 glasses per week.     Family History: The patient's family history includes  Alcoholism in her father; Brain cancer in her maternal aunt; Breast cancer in her maternal aunt; CVA in an other family member; Cancer in her maternal grandmother; Diabetes in her mother; Healthy in her sister; Heart disease in an other family member; High Cholesterol in her mother; Hyperlipidemia in an other family member; Hypertension in her mother and another family member; Liver cancer in her father; Memory loss in her mother.  ROS:   Please see the history of present illness.    No blood in  the urine or stool.  All other systems reviewed and are negative.  EKGs/Labs/Other Studies Reviewed:    The following studies were reviewed today: None  EKG:  EKG EKG demonstrates atrial pacing with normal conduction and no evidence of V pacing.  Heart rate is 76.  Nonspecific ST abnormality.  Recent Labs: No results found for requested labs within last 8760 hours.  Recent Lipid Panel    Component Value Date/Time   CHOL 181 12/26/2013 0824   TRIG 224.0 (H) 12/26/2013 0824   HDL 46.00 12/26/2013 0824   CHOLHDL 4 12/26/2013 0824   VLDL 44.8 (H) 12/26/2013 0824   LDLCALC 68 05/09/2013 0940   LDLDIRECT 106.6 12/26/2013 0824    Physical Exam:    VS:  BP 118/68   Pulse 76   Ht 5\' 5"  (1.651 m)   Wt 147 lb 12.8 oz (67 kg)   SpO2 95%   BMI 24.60 kg/m     Wt Readings from Last 3 Encounters:  01/12/19 147 lb 12.8 oz (67 kg)  04/22/18 147 lb 12.8 oz (67 kg)  08/10/17 145 lb (65.8 kg)     GEN: Mast, appears compensated.. No acute distress HEENT: Normal NECK: No JVD. LYMPHATICS: No lymphadenopathy CARDIAC:  RRR without murmur, gallop, or edema. VASCULAR:  Normal Pulses. No bruits. RESPIRATORY:  Clear to auscultation without rales, wheezing or rhonchi  ABDOMEN: Soft, non-tender, non-distended, No pulsatile mass, MUSCULOSKELETAL: No deformity  SKIN: Warm and dry NEUROLOGIC:  Alert and oriented x 3 PSYCHIATRIC:  Normal affect   ASSESSMENT:    1. Paroxysmal atrial fibrillation (HCC)    2. Tachycardia-bradycardia syndrome ([place])   3. Cerebrovascular accident (CVA) due to embolism of middle cerebral artery, unspecified blood vessel laterality ([place])   4. Chronic anticoagulation   5. Essential hypertension   6. Cardiac pacemaker in situ   7. Educated about COVID-19 virus infection    PLAN:    In order of problems listed above:  1. She has not needed to use Tambocor.  Tambocor should be used as a pill in the pocket.  No symptomatic atrial fibrillation. 2. Pacemaker function is normal. 3. No neurological events or symptoms. 4. Coumadin therapy without complications. 5. Excellent blood pressure control. 6. Pacemaker interrogations have been unremarkable. 7. Spacing, mask wearing, and handwashing is advocated.   Medication Adjustments/Labs and Tests Ordered: Current medicines are reviewed at length with the patient today.  Concerns regarding medicines are outlined above.  Orders Placed This Encounter  Procedures  . EKG 12-Lead   No orders of the defined types were placed in this encounter.   Patient Instructions  Medication Instructions:  Your physician recommends that you continue on your current medications as directed. Please refer to the Current Medication list given to you today.  If you need a refill on your cardiac medications before your next appointment, please call your pharmacy.   Lab work: None If you have labs (blood work) drawn today and your tests are completely normal, you will receive your results only by: [name] MyChart Message (if you have MyChart) OR . A paper copy in the mail If you have any lab test that is abnormal or we need to change your treatment, we will call you to review the results.  Testing/Procedures: None  Follow-Up: At [facility], you and your health needs are our priority.  As part of our continuing mission to provide you with exceptional heart care, we have created designated Provider Care Teams.  These Care Teams include  your primary Cardiologist (physician) and Advanced Practice Providers (APPs -  Physician Assistants and Nurse Practitioners) who all work together to provide you with the care you need, when you need it. You will need a follow up appointment in 12 months.  Please call our office 2 months in advance to schedule this appointment.  You may see Dr. Daneen Schick or one of the following Advanced Practice Providers on your designated Care Team:   Truitt Merle, NP Cecilie Kicks, NP . Kathyrn Drown, NP  Any Other Special Instructions Will Be Listed Below (If Applicable).       Signed, Sinclair Grooms, MD  01/12/2019 3:38 PM    [place]

## 2019-01-12 ENCOUNTER — Ambulatory Visit (INDEPENDENT_AMBULATORY_CARE_PROVIDER_SITE_OTHER): Payer: Medicare Other | Admitting: Pharmacist

## 2019-01-12 ENCOUNTER — Other Ambulatory Visit: Payer: Self-pay

## 2019-01-12 ENCOUNTER — Encounter: Payer: Self-pay | Admitting: Interventional Cardiology

## 2019-01-12 ENCOUNTER — Ambulatory Visit (INDEPENDENT_AMBULATORY_CARE_PROVIDER_SITE_OTHER): Payer: Medicare Other | Admitting: Interventional Cardiology

## 2019-01-12 VITALS — BP 118/68 | HR 76 | Ht 65.0 in | Wt 147.8 lb

## 2019-01-12 DIAGNOSIS — Z950 Presence of cardiac pacemaker: Secondary | ICD-10-CM

## 2019-01-12 DIAGNOSIS — I495 Sick sinus syndrome: Secondary | ICD-10-CM | POA: Diagnosis not present

## 2019-01-12 DIAGNOSIS — Z7901 Long term (current) use of anticoagulants: Secondary | ICD-10-CM

## 2019-01-12 DIAGNOSIS — I10 Essential (primary) hypertension: Secondary | ICD-10-CM

## 2019-01-12 DIAGNOSIS — Z7189 Other specified counseling: Secondary | ICD-10-CM

## 2019-01-12 DIAGNOSIS — I480 Paroxysmal atrial fibrillation: Secondary | ICD-10-CM

## 2019-01-12 DIAGNOSIS — Z5181 Encounter for therapeutic drug level monitoring: Secondary | ICD-10-CM

## 2019-01-12 DIAGNOSIS — I63419 Cerebral infarction due to embolism of unspecified middle cerebral artery: Secondary | ICD-10-CM | POA: Diagnosis not present

## 2019-01-12 LAB — POCT INR: INR: 2.3 (ref 2.0–3.0)

## 2019-01-12 NOTE — Patient Instructions (Signed)
Medication Instructions:  Your physician recommends that you continue on your current medications as directed. Please refer to the Current Medication list given to you today.  If you need a refill on your cardiac medications before your next appointment, please call your pharmacy.   Lab work: None If you have labs (blood work) drawn today and your tests are completely normal, you will receive your results only by: [name] MyChart Message (if you have MyChart) OR . A paper copy in the mail If you have any lab test that is abnormal or we need to change your treatment, we will call you to review the results.  Testing/Procedures: None  Follow-Up: At [facility], you and your health needs are our priority.  As part of our continuing mission to provide you with exceptional heart care, we have created designated Provider Care Teams.  These Care Teams include your primary Cardiologist (physician) and Advanced Practice Providers (APPs -  Physician Assistants and Nurse Practitioners) who all work together to provide you with the care you need, when you need it. You will need a follow up appointment in 12 months.  Please call our office 2 months in advance to schedule this appointment.  You may see Dr. Daneen Schick or one of the following Advanced Practice Providers on your designated Care Team:   Truitt Merle, NP Cecilie Kicks, NP . Kathyrn Drown, NP  Any Other Special Instructions Will Be Listed Below (If Applicable).

## 2019-01-12 NOTE — Patient Instructions (Signed)
Continue taking 1 tablet daily except 1.5 tablets each Mondays,  Wednesdays, and Fridays. Repeat INR in 4 weeks.

## 2019-01-24 ENCOUNTER — Ambulatory Visit (INDEPENDENT_AMBULATORY_CARE_PROVIDER_SITE_OTHER): Payer: Medicare Other | Admitting: *Deleted

## 2019-01-24 DIAGNOSIS — I495 Sick sinus syndrome: Secondary | ICD-10-CM

## 2019-01-24 DIAGNOSIS — I480 Paroxysmal atrial fibrillation: Secondary | ICD-10-CM

## 2019-01-24 LAB — CUP PACEART REMOTE DEVICE CHECK
Battery Remaining Longevity: 137 mo
Battery Voltage: 3.01 V
Brady Statistic AP VP Percent: 1.2 %
Brady Statistic AP VS Percent: 94.66 %
Brady Statistic AS VP Percent: 0 %
Brady Statistic AS VS Percent: 4.13 %
Brady Statistic RA Percent Paced: 96.57 %
Brady Statistic RV Percent Paced: 1.21 %
Date Time Interrogation Session: 20201019062108
Implantable Lead Implant Date: 20180914
Implantable Lead Implant Date: 20180914
Implantable Lead Location: 753859
Implantable Lead Location: 753860
Implantable Lead Model: 5076
Implantable Lead Model: 5076
Implantable Pulse Generator Implant Date: 20180914
Lead Channel Impedance Value: 323 Ohm
Lead Channel Impedance Value: 399 Ohm
Lead Channel Impedance Value: 418 Ohm
Lead Channel Impedance Value: 513 Ohm
Lead Channel Pacing Threshold Amplitude: 0.625 V
Lead Channel Pacing Threshold Amplitude: 0.875 V
Lead Channel Pacing Threshold Pulse Width: 0.4 ms
Lead Channel Pacing Threshold Pulse Width: 0.4 ms
Lead Channel Sensing Intrinsic Amplitude: 12.375 mV
Lead Channel Sensing Intrinsic Amplitude: 12.375 mV
Lead Channel Sensing Intrinsic Amplitude: 2.75 mV
Lead Channel Sensing Intrinsic Amplitude: 2.75 mV
Lead Channel Setting Pacing Amplitude: 1.5 V
Lead Channel Setting Pacing Amplitude: 2.5 V
Lead Channel Setting Pacing Pulse Width: 0.4 ms
Lead Channel Setting Sensing Sensitivity: 0.9 mV

## 2019-02-01 ENCOUNTER — Other Ambulatory Visit: Payer: Self-pay | Admitting: Family Medicine

## 2019-02-01 DIAGNOSIS — Z1231 Encounter for screening mammogram for malignant neoplasm of breast: Secondary | ICD-10-CM

## 2019-02-08 DIAGNOSIS — Z13220 Encounter for screening for lipoid disorders: Secondary | ICD-10-CM | POA: Diagnosis not present

## 2019-02-08 DIAGNOSIS — E039 Hypothyroidism, unspecified: Secondary | ICD-10-CM | POA: Diagnosis not present

## 2019-02-08 DIAGNOSIS — Z0000 Encounter for general adult medical examination without abnormal findings: Secondary | ICD-10-CM | POA: Diagnosis not present

## 2019-02-08 DIAGNOSIS — E538 Deficiency of other specified B group vitamins: Secondary | ICD-10-CM | POA: Diagnosis not present

## 2019-02-08 DIAGNOSIS — I4891 Unspecified atrial fibrillation: Secondary | ICD-10-CM | POA: Diagnosis not present

## 2019-02-08 DIAGNOSIS — L309 Dermatitis, unspecified: Secondary | ICD-10-CM | POA: Diagnosis not present

## 2019-02-08 DIAGNOSIS — F419 Anxiety disorder, unspecified: Secondary | ICD-10-CM | POA: Diagnosis not present

## 2019-02-08 DIAGNOSIS — K219 Gastro-esophageal reflux disease without esophagitis: Secondary | ICD-10-CM | POA: Diagnosis not present

## 2019-02-08 DIAGNOSIS — G459 Transient cerebral ischemic attack, unspecified: Secondary | ICD-10-CM | POA: Diagnosis not present

## 2019-02-08 DIAGNOSIS — I10 Essential (primary) hypertension: Secondary | ICD-10-CM | POA: Diagnosis not present

## 2019-02-08 DIAGNOSIS — M8588 Other specified disorders of bone density and structure, other site: Secondary | ICD-10-CM | POA: Diagnosis not present

## 2019-02-08 DIAGNOSIS — Z23 Encounter for immunization: Secondary | ICD-10-CM | POA: Diagnosis not present

## 2019-02-09 ENCOUNTER — Other Ambulatory Visit: Payer: Self-pay

## 2019-02-09 ENCOUNTER — Ambulatory Visit (INDEPENDENT_AMBULATORY_CARE_PROVIDER_SITE_OTHER): Payer: Medicare Other | Admitting: Pharmacist

## 2019-02-09 DIAGNOSIS — Z5181 Encounter for therapeutic drug level monitoring: Secondary | ICD-10-CM

## 2019-02-09 DIAGNOSIS — I480 Paroxysmal atrial fibrillation: Secondary | ICD-10-CM | POA: Diagnosis not present

## 2019-02-09 LAB — POCT INR: INR: 1.9 — AB (ref 2.0–3.0)

## 2019-02-10 IMAGING — DX DG ANKLE PORT 2V*L*
1 series · 3 of 3 positions shown · non-contrast
Comparison: None.

CLINICAL DATA: ORIF of left ankle

EXAM:
PORTABLE LEFT ANKLE - 2 VIEW

[Series 1: ankle · 0.14mm/px · 3 of 3 slices shown]
[im 1/3]
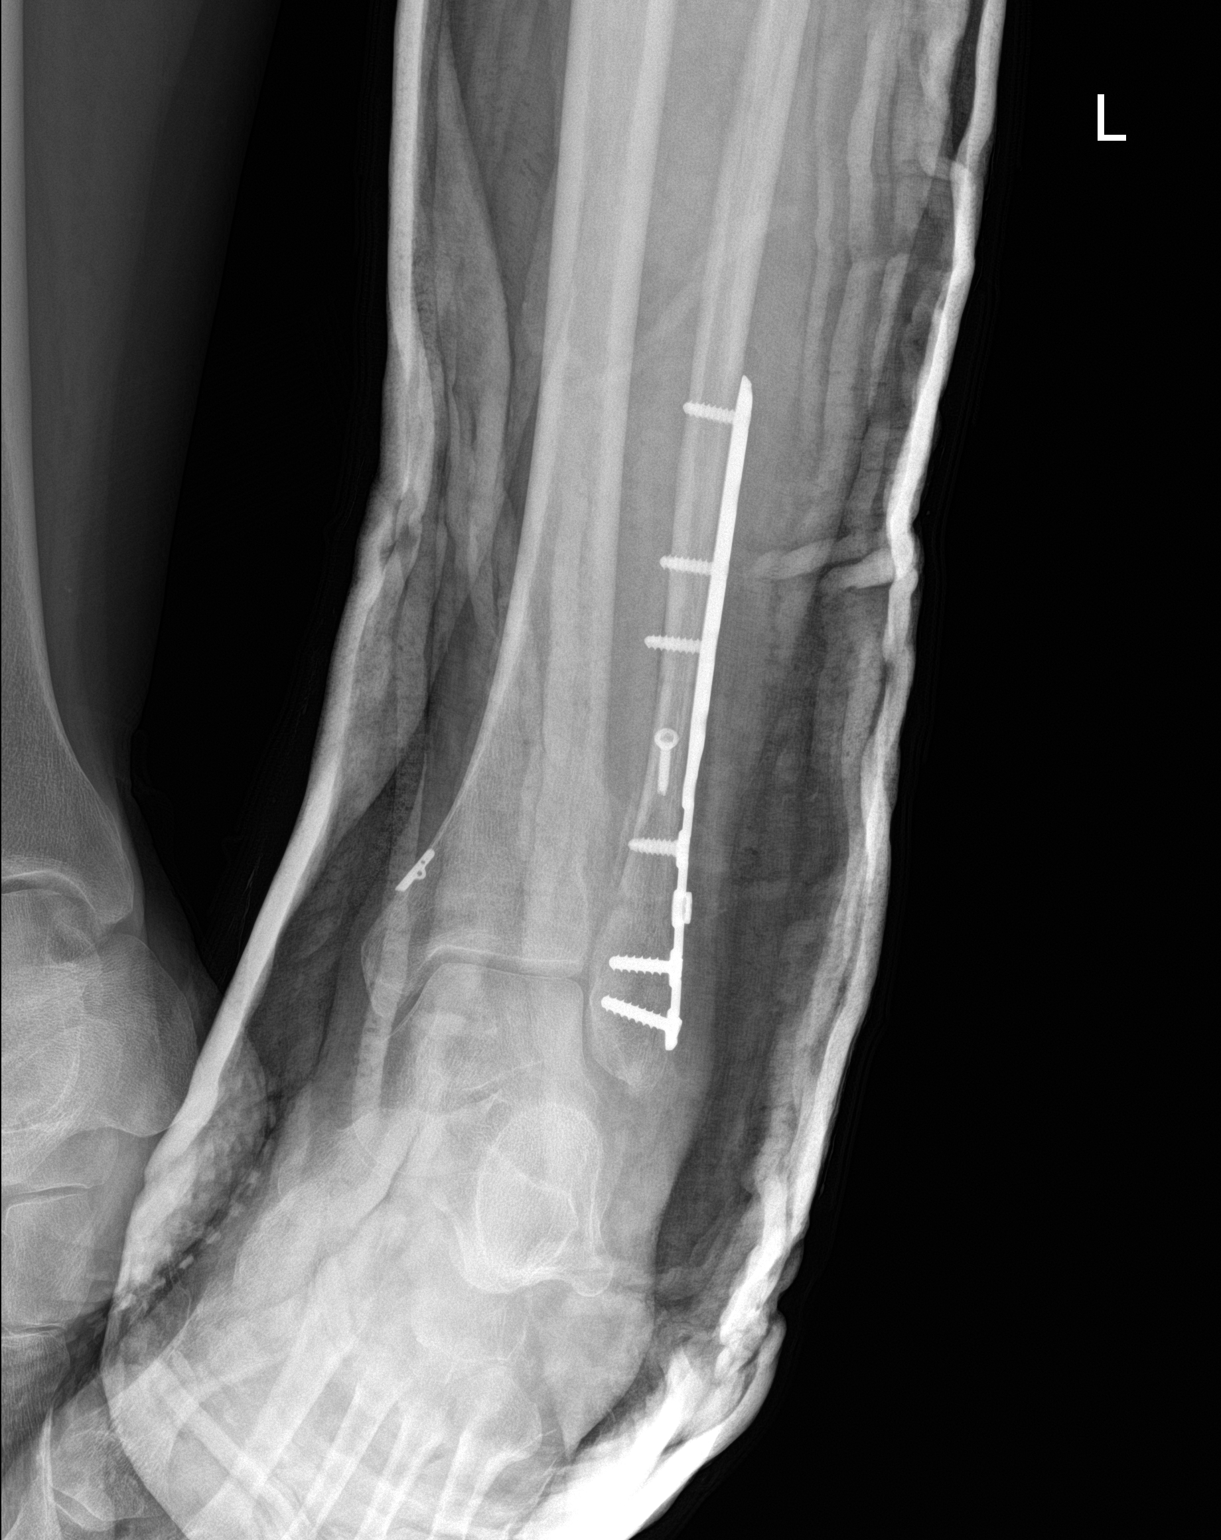
[im 2/3]
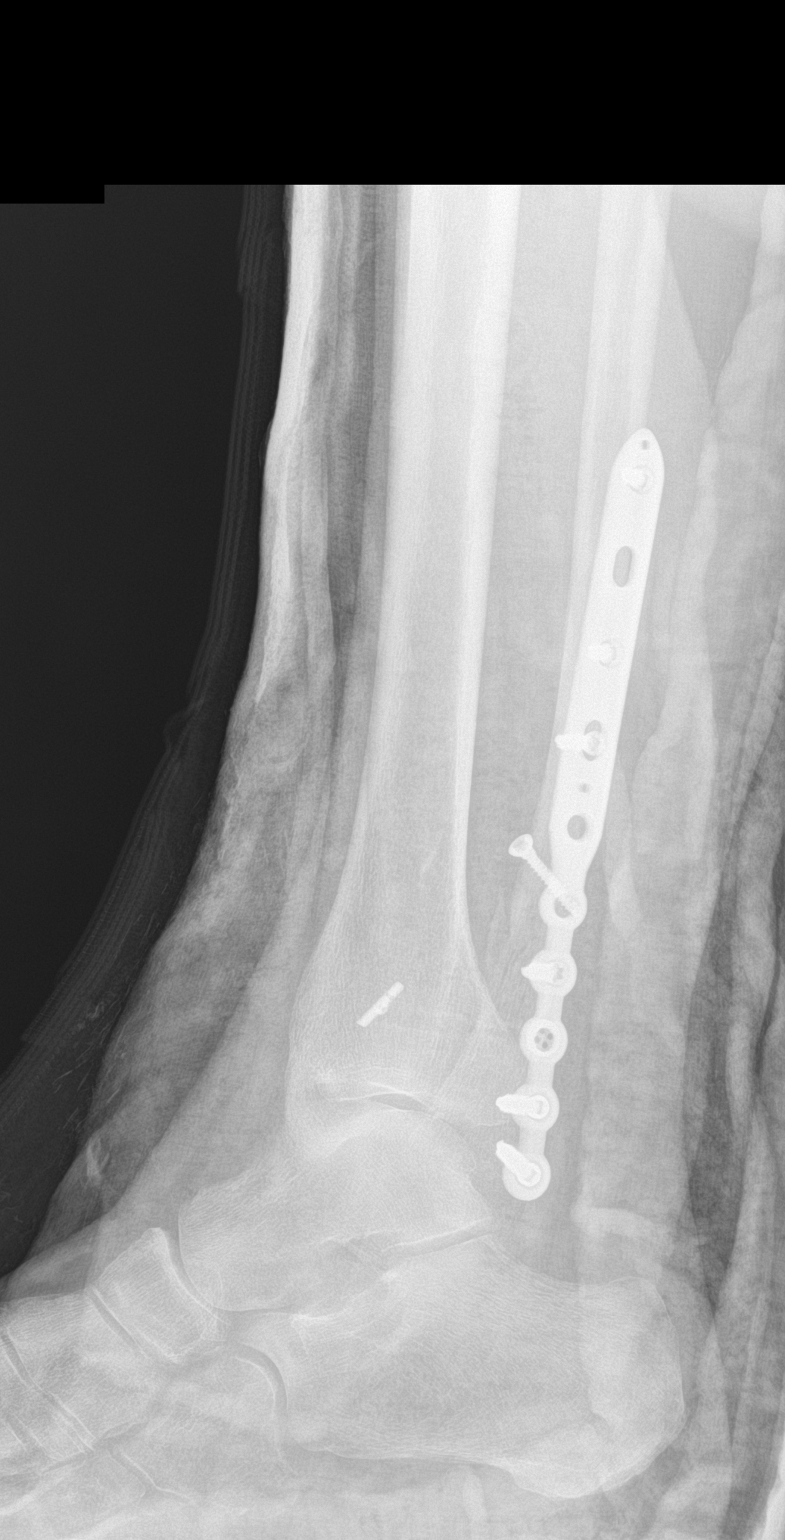
[im 3/3]
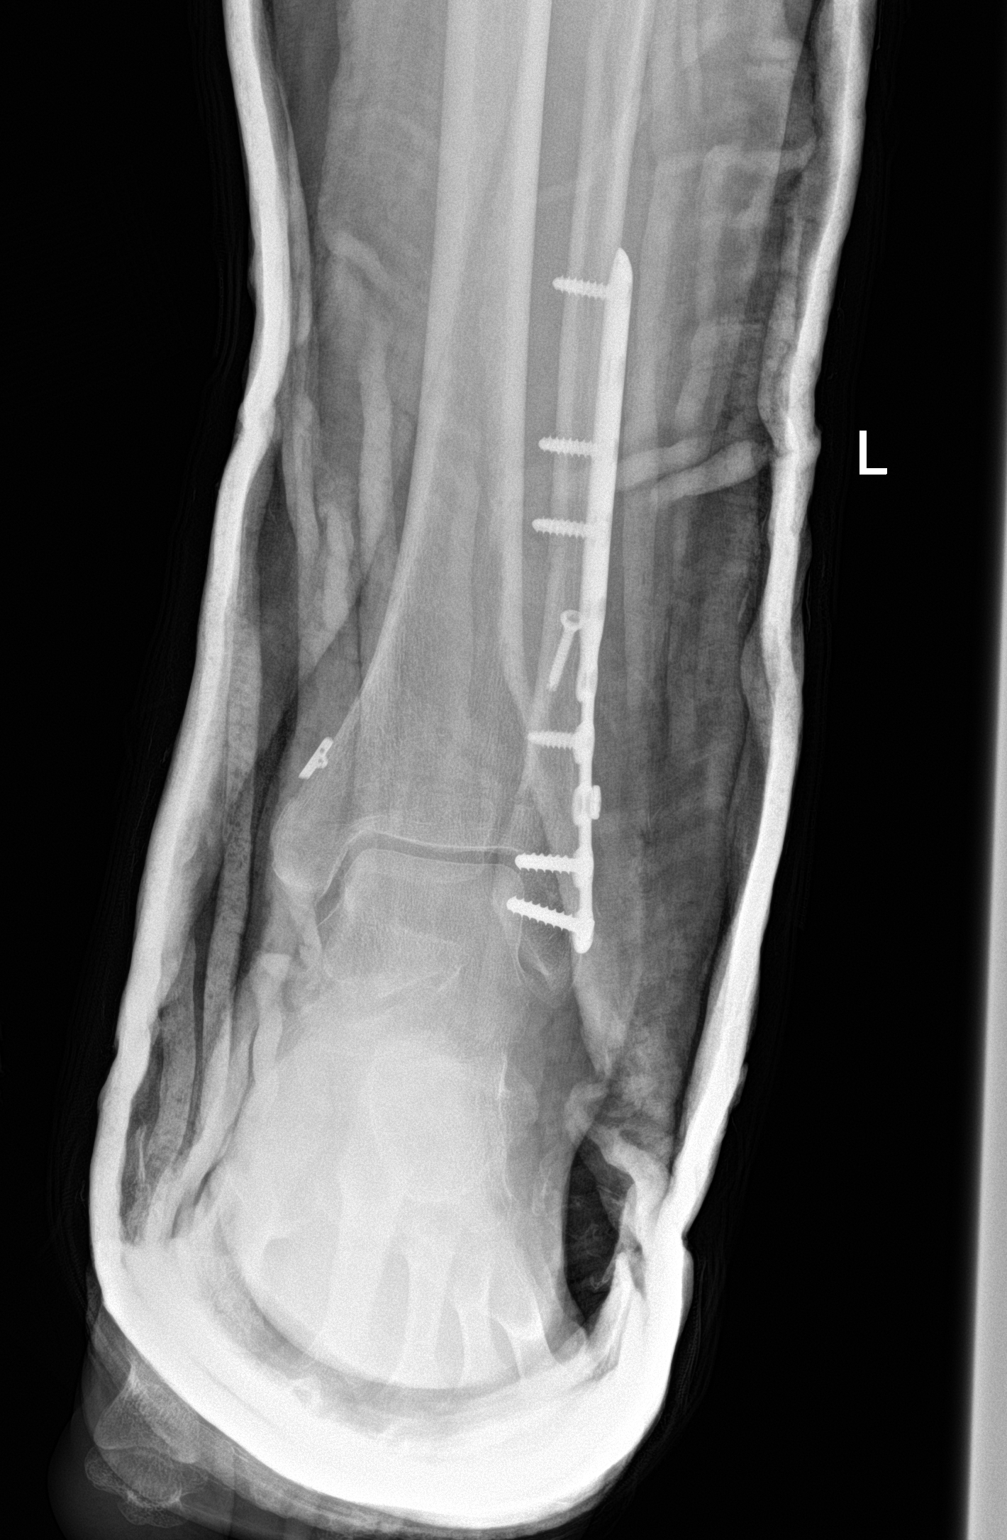

[3 of 3 positions shown; findings below may reference images not displayed]

FINDINGS: The patient is now in a cast. The patient is status post distal
fibular fracture ORIF and syndesmosis repair. No other changes.
IMPRESSION: Postsurgical changes as above.

## 2019-02-14 NOTE — Progress Notes (Signed)
Remote pacemaker transmission.   

## 2019-02-23 ENCOUNTER — Other Ambulatory Visit: Payer: Self-pay | Admitting: Family Medicine

## 2019-02-23 DIAGNOSIS — M810 Age-related osteoporosis without current pathological fracture: Secondary | ICD-10-CM

## 2019-02-24 DIAGNOSIS — B351 Tinea unguium: Secondary | ICD-10-CM | POA: Diagnosis not present

## 2019-02-24 DIAGNOSIS — Z85820 Personal history of malignant melanoma of skin: Secondary | ICD-10-CM | POA: Diagnosis not present

## 2019-02-24 DIAGNOSIS — L300 Nummular dermatitis: Secondary | ICD-10-CM | POA: Diagnosis not present

## 2019-02-24 DIAGNOSIS — H02035 Senile entropion of left lower eyelid: Secondary | ICD-10-CM | POA: Diagnosis not present

## 2019-02-24 DIAGNOSIS — L821 Other seborrheic keratosis: Secondary | ICD-10-CM | POA: Diagnosis not present

## 2019-02-24 DIAGNOSIS — D225 Melanocytic nevi of trunk: Secondary | ICD-10-CM | POA: Diagnosis not present

## 2019-02-24 DIAGNOSIS — L814 Other melanin hyperpigmentation: Secondary | ICD-10-CM | POA: Diagnosis not present

## 2019-02-24 DIAGNOSIS — L910 Hypertrophic scar: Secondary | ICD-10-CM | POA: Diagnosis not present

## 2019-03-01 DIAGNOSIS — H02055 Trichiasis without entropian left lower eyelid: Secondary | ICD-10-CM | POA: Diagnosis not present

## 2019-03-10 ENCOUNTER — Telehealth: Payer: Self-pay

## 2019-03-10 NOTE — Telephone Encounter (Signed)
lmomed to move appt to 12 will await callback

## 2019-03-18 ENCOUNTER — Other Ambulatory Visit: Payer: Self-pay

## 2019-03-18 ENCOUNTER — Ambulatory Visit (INDEPENDENT_AMBULATORY_CARE_PROVIDER_SITE_OTHER): Payer: Medicare Other | Admitting: Pharmacist Clinician (PhC)/ Clinical Pharmacy Specialist

## 2019-03-18 DIAGNOSIS — Z5181 Encounter for therapeutic drug level monitoring: Secondary | ICD-10-CM

## 2019-03-18 DIAGNOSIS — I480 Paroxysmal atrial fibrillation: Secondary | ICD-10-CM

## 2019-03-18 LAB — POCT INR: INR: 2 (ref 2.0–3.0)

## 2019-03-21 ENCOUNTER — Other Ambulatory Visit: Payer: Self-pay | Admitting: Interventional Cardiology

## 2019-03-22 ENCOUNTER — Other Ambulatory Visit: Payer: Self-pay

## 2019-03-22 ENCOUNTER — Ambulatory Visit
Admission: RE | Admit: 2019-03-22 | Discharge: 2019-03-22 | Disposition: A | Discharge status: Home / Self Care | Payer: Medicare Other | Source: Ambulatory Visit | Attending: Family Medicine | Admitting: Family Medicine

## 2019-03-22 DIAGNOSIS — Z1231 Encounter for screening mammogram for malignant neoplasm of breast: Secondary | ICD-10-CM

## 2019-03-23 ENCOUNTER — Ambulatory Visit: Payer: Medicare Other

## 2019-03-24 ENCOUNTER — Other Ambulatory Visit: Payer: Self-pay | Admitting: Family Medicine

## 2019-03-24 DIAGNOSIS — R928 Other abnormal and inconclusive findings on diagnostic imaging of breast: Secondary | ICD-10-CM

## 2019-03-30 ENCOUNTER — Other Ambulatory Visit: Payer: Self-pay

## 2019-03-30 ENCOUNTER — Ambulatory Visit
Admission: RE | Admit: 2019-03-30 | Discharge: 2019-03-30 | Disposition: A | Discharge status: Home / Self Care | Payer: Medicare Other | Source: Ambulatory Visit | Attending: Family Medicine | Admitting: Family Medicine

## 2019-03-30 ENCOUNTER — Other Ambulatory Visit: Payer: Self-pay | Admitting: Family Medicine

## 2019-03-30 ENCOUNTER — Telehealth: Payer: Self-pay | Admitting: Interventional Cardiology

## 2019-03-30 DIAGNOSIS — R928 Other abnormal and inconclusive findings on diagnostic imaging of breast: Secondary | ICD-10-CM

## 2019-03-30 NOTE — Telephone Encounter (Signed)
Patient calling stating she has a biopsy scheduled 1/4. She would like to move her coumadin appointment on 1/15 up to 12/31 so her doctor will have the results before the biopsy.

## 2019-03-30 NOTE — Telephone Encounter (Signed)
Appointment changed

## 2019-04-07 ENCOUNTER — Other Ambulatory Visit: Payer: Self-pay

## 2019-04-07 ENCOUNTER — Ambulatory Visit (INDEPENDENT_AMBULATORY_CARE_PROVIDER_SITE_OTHER): Payer: Medicare Other | Admitting: Pharmacist

## 2019-04-07 DIAGNOSIS — I480 Paroxysmal atrial fibrillation: Secondary | ICD-10-CM

## 2019-04-07 DIAGNOSIS — Z5181 Encounter for therapeutic drug level monitoring: Secondary | ICD-10-CM

## 2019-04-07 LAB — POCT INR: INR: 1.8 — AB (ref 2.0–3.0)

## 2019-04-08 DIAGNOSIS — C50919 Malignant neoplasm of unspecified site of unspecified female breast: Secondary | ICD-10-CM

## 2019-04-08 HISTORY — DX: Malignant neoplasm of unspecified site of unspecified female breast: C50.919

## 2019-04-11 ENCOUNTER — Other Ambulatory Visit: Payer: Self-pay

## 2019-04-11 ENCOUNTER — Ambulatory Visit
Admission: RE | Admit: 2019-04-11 | Discharge: 2019-04-11 | Disposition: A | Discharge status: Home / Self Care | Payer: Medicare Other | Source: Ambulatory Visit | Attending: Family Medicine | Admitting: Family Medicine

## 2019-04-11 DIAGNOSIS — R928 Other abnormal and inconclusive findings on diagnostic imaging of breast: Secondary | ICD-10-CM

## 2019-04-11 DIAGNOSIS — N6324 Unspecified lump in the left breast, lower inner quadrant: Secondary | ICD-10-CM | POA: Diagnosis not present

## 2019-04-18 ENCOUNTER — Ambulatory Visit (INDEPENDENT_AMBULATORY_CARE_PROVIDER_SITE_OTHER): Payer: Medicare Other | Admitting: Nurse Practitioner

## 2019-04-18 ENCOUNTER — Other Ambulatory Visit: Payer: Self-pay

## 2019-04-18 ENCOUNTER — Encounter: Payer: Self-pay | Admitting: Nurse Practitioner

## 2019-04-18 VITALS — BP 108/72 | HR 82 | Ht 65.0 in | Wt 150.4 lb

## 2019-04-18 DIAGNOSIS — I480 Paroxysmal atrial fibrillation: Secondary | ICD-10-CM

## 2019-04-18 DIAGNOSIS — I495 Sick sinus syndrome: Secondary | ICD-10-CM | POA: Diagnosis not present

## 2019-04-18 MED ORDER — FLECAINIDE ACETATE 100 MG PO TABS: 200.0000 mg | ORAL_TABLET | Freq: Every day | ORAL | 3 refills | Status: DC | PRN

## 2019-04-18 MED ORDER — METOPROLOL SUCCINATE ER 25 MG PO TB24: 12.5000 mg | ORAL_TABLET | Freq: Every day | ORAL | 3 refills | Status: DC | PRN

## 2019-04-18 NOTE — Progress Notes (Signed)
Electrophysiology Office Note Date: 04/18/2019  ID:  [name], DOB 04-14-1945, MRN [id number]  PCP: Aura Dials, MD Primary Cardiologist: Tamala Julian Electrophysiologist: Allred  CC: Pacemaker follow-up  [name] is a 74 y.o. female seen today for Dr Rayann Heman.  She presents today for routine electrophysiology followup.  Since last being seen in our clinic, the patient reports doing relatively well.  She has a breast lump excision with Dr Gershon Crane upcoming. She also fell in the garden earlier this year.  She denies chest pain, palpitations, dyspnea, PND, orthopnea, nausea, vomiting, dizziness, syncope, edema, weight gain, or early satiety.  Device History: MDT dual chamber PPM implanted 2018 for SSS   Past Medical History:  Diagnosis Date  . Acoustic neuroma (HCC)    Hx of right ear acoustic neuroma, removed in 1999. No hearing in right ear.  [name] Anxiety    Hx of, responds to Wellbutrin  . Basal cell carcinoma    s/p MOHS surgery in 03/2011  . Cataract   . Chronic anticoagulation 11/24/2016  . Chronic cholecystitis with calculus 05/13/2013  . Closed fracture of left lateral malleolus 08/10/2017  . COPD (chronic obstructive pulmonary disease) ([place])   . Deafness in right ear   . Embolic stroke ([place]) [id number]  . Family history of adverse reaction to anesthesia    Family hx of PONV  . Fracture    left lateral malleolus fracture  . GERD (gastroesophageal reflux disease)   . Headache(784.0)    MIGRAINES  . Hearing loss    Only in right ear, from an acoustic neuroma. S/P removal in 1999.   [name] History of hiatal hernia   . History of neck pain    Responds to Flexeril  . History of shingles   . Hypercholesteremia   . Hypertension   . Hypothyroidism   . PAF (paroxysmal atrial fibrillation) ([place])    2 brief episodes of Afib recorded on monitor 02/2007  . Paroxysmal atrial fibrillation (HCC)   . PONV (postoperative nausea and vomiting)   . Presence of  permanent cardiac pacemaker   . Sick sinus syndrome ([place]) 12/19/2016  . Sinus bradycardia   . Tachycardia-bradycardia syndrome ([place]) 11/24/2016  . TIA (transient ischemic attack) 2011   On coumadin. Asymptomatic, without recurrence.   Past Surgical History:  Procedure Laterality Date  . ABDOMINAL HYSTERECTOMY    . APPENDECTOMY    . BREAST BIOPSY     X 3  . BREAST CYST ASPIRATION Left 50 yrs ago per pt  . BREAST EXCISIONAL BIOPSY Left 50 yrs ago  . CHOLECYSTECTOMY N/A 05/26/2013   Procedure: LAPAROSCOPIC CHOLECYSTECTOMY WITH INTRAOPERATIVE CHOLANGIOGRAM;  Surgeon: Imogene Burn. Georgette Dover, MD;  Location: [facility];  Service: General;  Laterality: N/A;  . CRANIECTOMY FOR EXCISION OF ACOUSTIC NEUROMA Right 1999  . INTRAOCULAR LENS INSERTION     Dr. Talbert Forest  . MOHS SURGERY  03/2011   Basal cell skin cancer  . ORIF ANKLE FRACTURE Left 08/10/2017   Procedure: OPEN REDUCTION INTERNAL FIXATION (ORIF) LEFT ANKLE FRACTURE;  Surgeon: Rod Can, MD;  Location: [facility];  Service: Orthopedics;  Laterality: Left;  . PACEMAKER IMPLANT N/A 12/19/2016   Medtronic Azure XT MRI conditional dual-chamber pacemaker for symptomatic sinus bradycardia by Dr Rayann Heman  . TONSILLECTOMY      Current Outpatient Medications  Medication Sig Dispense Refill  . clonazePAM (KLONOPIN) 0.5 MG tablet Take 0.5 mg by mouth daily.     . flecainide (TAMBOCOR) 100 MG tablet Take 2 tablets (200  mg total) by mouth daily as needed (Take one (1) hour after Toprol if still in AFIB.). 90 tablet 3  . FLUoxetine (PROZAC) 20 MG capsule Take 40 mg by mouth daily with breakfast. Take with 10 mg to equal 30 mg daily.    [name] ketotifen (ALAWAY) 0.025 % ophthalmic solution Place 1-2 drops into both eyes 2 (two) times daily as needed (for allergies).    [name] levothyroxine (SYNTHROID, LEVOTHROID) 88 MCG tablet Take 88 mcg by mouth daily before breakfast.    . loratadine (CLARITIN) 10 MG tablet Take 10 mg by mouth daily as needed for allergies.    . metoprolol  succinate (TOPROL-XL) 25 MG 24 hr tablet Take 0.5 tablets (12.5 mg total) by mouth daily as needed (For recurrent AFIB). 90 tablet 3  . pantoprazole (PROTONIX) 40 MG tablet Take 40 mg by mouth daily as needed (acid reflux).     . warfarin (COUMADIN) 2.5 MG tablet TAKE 1-2 TABLETS BY MOUTH DAILY AS DIRECTED BY COUMADIN CLINIC. 120 tablet 0   No current facility-administered medications for this visit.    Allergies:   Atorvastatin, Statins, Ezetimibe, Codeine, Latex, and Penicillins   Social History: Social History   Socioeconomic History  . Marital status: Married    Spouse name: [name]  . Number of children: 3  . Years of education: masters  . Highest education level: Not on file  Occupational History  . Occupation: retired    Comment: [employer]  Tobacco Use  . Smoking status: Never Smoker  . Smokeless tobacco: Never Used  Substance and Sexual Activity  . Alcohol use: Yes    Comment: Rare  . Drug use: No  . Sexual activity: Never  Other Topics Concern  . Not on file  Social History Narrative   Pt lives with spouse.    Caffeine Use: 1-2 glasses per week.   Social Determinants of Health   Financial Resource Strain:   . Difficulty of Paying Living Expenses: Not on file  Food Insecurity:   . Worried About Charity fundraiser in the Last Year: Not on file  . Ran Out of Food in the Last Year: Not on file  Transportation Needs:   . Lack of Transportation (Medical): Not on file  . Lack of Transportation (Non-Medical): Not on file  Physical Activity:   . Days of Exercise per Week: Not on file  . Minutes of Exercise per Session: Not on file  Stress:   . Feeling of Stress : Not on file  Social Connections:   . Frequency of Communication with Friends and Family: Not on file  . Frequency of Social Gatherings with Friends and Family: Not on file  . Attends Religious Services: Not on file  . Active Member of Clubs or Organizations: Not on file  . Attends Archivist  Meetings: Not on file  . Marital Status: Not on file  Intimate Partner Violence:   . Fear of Current or Ex-Partner: Not on file  . Emotionally Abused: Not on file  . Physically Abused: Not on file  . Sexually Abused: Not on file    Family History: Family History  Problem Relation Age of Onset  . Alcoholism Father   . Liver cancer Father   . Diabetes Mother   . High Cholesterol Mother   . Hypertension Mother   . Memory loss Mother   . Healthy Sister   . Breast cancer Maternal Aunt   . Brain cancer Maternal Aunt   .  Cancer Maternal Grandmother        Unknown type  . Hypertension Other        paternal & maternal side with HTN, all lived into their 69s  . Heart disease Other        paternal & maternal side with heart disease, all lived into their 81s  . Hyperlipidemia Other        paternal & maternal side with hyperlipidemia, all lived into their 70s  . CVA Other        paternal & maternal side with strokes, all lived into their 15s     Review of Systems: All other systems reviewed and are otherwise negative except as noted above.   Physical Exam: VS:  BP 108/72   Pulse 82   Ht 5\' 5"  (1.651 m)   Wt 150 lb 6.4 oz (68.2 kg)   SpO2 96%   BMI 25.03 kg/m  , BMI Body mass index is 25.03 kg/m.  GEN- The patient is well appearing, alert and oriented x 3 today.   HEENT: normocephalic, atraumatic; sclera clear, conjunctiva pink; hearing intact; oropharynx clear; neck supple  Lungs- Clear to ausculation bilaterally, normal work of breathing.  No wheezes, rales, rhonchi Heart- Regular rate and rhythm, no murmurs, rubs or gallops  GI- soft, non-tender, non-distended, bowel sounds present  Extremities- no clubbing, cyanosis, or edema  MS- no significant deformity or atrophy Skin- warm and dry, no rash or lesion; PPM pocket well healed Psych- euthymic mood, full affect Neuro- strength and sensation are intact  PPM Interrogation- reviewed in detail today,  See PACEART  report  EKG:  EKG is not ordered today.  Recent Labs: No results found for requested labs within last 8760 hours.   Wt Readings from Last 3 Encounters:  04/18/19 150 lb 6.4 oz (68.2 kg)  01/12/19 147 lb 12.8 oz (67 kg)  04/22/18 147 lb 12.8 oz (67 kg)     Other studies Reviewed: Additional studies/ records that were reviewed today include: Dr smith's office notes  Assessment and Plan:  1.  Symptomatic sinus bradycardia Normal PPM function See Pace Art report No changes today  2.  Paroxysmal atrial fibrillation Burden by device interrogation is low CHADS2VASC is 5 - continue OAC Continue prn Flecainide No bleeding issues CBC followed by PCP    Current medicines are reviewed at length with the patient today.   The patient does not have concerns regarding her medicines.  The following changes were made today:  none  Labs/ tests ordered today include: none No orders of the defined types were placed in this encounter.    Disposition:   Follow up with [facility], me in 1 year      Signed, Chanetta Marshall, NP 04/18/2019 11:26 AM  [place] [address] [phone] (office) [phone] (fax)

## 2019-04-18 NOTE — Patient Instructions (Signed)
Medication Instructions:  none *If you need a refill on your cardiac medications before your next appointment, please call your pharmacy*  Lab Work: none If you have labs (blood work) drawn today and your tests are completely normal, you will receive your results only by: [name] MyChart Message (if you have MyChart) OR . A paper copy in the mail If you have any lab test that is abnormal or we need to change your treatment, we will call you to review the results.  Testing/Procedures: none  Follow-Up: At [facility], you and your health needs are our priority.  As part of our continuing mission to provide you with exceptional heart care, we have created designated Provider Care Teams.  These Care Teams include your primary Cardiologist (physician) and Advanced Practice Providers (APPs -  Physician Assistants and Nurse Practitioners) who all work together to provide you with the care you need, when you need it.  Your next appointment:   1 year(s)  The format for your next appointment:   In Person  Provider:   Chanetta Marshall, NP  Other Instructions Remote monitoring is used to monitor your Pacemaker  from home. This monitoring reduces the number of office visits required to check your device to one time per year. It allows [place] to keep an eye on the functioning of your device to ensure it is working properly. You are scheduled for a device check from home on 04/25/19. You may send your transmission at any time that day. If you have a wireless device, the transmission will be sent automatically. After your physician reviews your transmission, you will receive a postcard with your next transmission date.

## 2019-04-25 ENCOUNTER — Encounter: Payer: Self-pay | Admitting: Pharmacist

## 2019-04-25 ENCOUNTER — Other Ambulatory Visit: Payer: Self-pay

## 2019-04-25 ENCOUNTER — Ambulatory Visit (INDEPENDENT_AMBULATORY_CARE_PROVIDER_SITE_OTHER): Payer: Medicare Other | Admitting: *Deleted

## 2019-04-25 ENCOUNTER — Ambulatory Visit (INDEPENDENT_AMBULATORY_CARE_PROVIDER_SITE_OTHER): Payer: Medicare Other | Admitting: Pharmacist

## 2019-04-25 DIAGNOSIS — I495 Sick sinus syndrome: Secondary | ICD-10-CM

## 2019-04-25 DIAGNOSIS — I480 Paroxysmal atrial fibrillation: Secondary | ICD-10-CM | POA: Diagnosis not present

## 2019-04-25 DIAGNOSIS — Z5181 Encounter for therapeutic drug level monitoring: Secondary | ICD-10-CM | POA: Diagnosis not present

## 2019-04-25 LAB — CUP PACEART REMOTE DEVICE CHECK
Battery Remaining Longevity: 134 mo
Battery Voltage: 3.02 V
Brady Statistic AP VP Percent: 1.07 %
Brady Statistic AP VS Percent: 94.37 %
Brady Statistic AS VP Percent: 0 %
Brady Statistic AS VS Percent: 4.56 %
Brady Statistic RA Percent Paced: 96.1 %
Brady Statistic RV Percent Paced: 1.08 %
Date Time Interrogation Session: 20210118003923
Implantable Lead Implant Date: 20180914
Implantable Lead Implant Date: 20180914
Implantable Lead Location: 753859
Implantable Lead Location: 753860
Implantable Lead Model: 5076
Implantable Lead Model: 5076
Implantable Pulse Generator Implant Date: 20180914
Lead Channel Impedance Value: 304 Ohm
Lead Channel Impedance Value: 399 Ohm
Lead Channel Impedance Value: 437 Ohm
Lead Channel Impedance Value: 513 Ohm
Lead Channel Pacing Threshold Amplitude: 0.625 V
Lead Channel Pacing Threshold Amplitude: 0.875 V
Lead Channel Pacing Threshold Pulse Width: 0.4 ms
Lead Channel Pacing Threshold Pulse Width: 0.4 ms
Lead Channel Sensing Intrinsic Amplitude: 12.5 mV
Lead Channel Sensing Intrinsic Amplitude: 12.5 mV
Lead Channel Sensing Intrinsic Amplitude: 3.375 mV
Lead Channel Sensing Intrinsic Amplitude: 3.375 mV
Lead Channel Setting Pacing Amplitude: 1.5 V
Lead Channel Setting Pacing Amplitude: 2.5 V
Lead Channel Setting Pacing Pulse Width: 0.4 ms
Lead Channel Setting Sensing Sensitivity: 0.9 mV

## 2019-04-25 LAB — POCT INR: INR: 1.4 — AB (ref 2.0–3.0)

## 2019-04-26 ENCOUNTER — Ambulatory Visit: Payer: Self-pay | Admitting: Surgery

## 2019-04-26 DIAGNOSIS — N6320 Unspecified lump in the left breast, unspecified quadrant: Secondary | ICD-10-CM

## 2019-04-26 NOTE — Progress Notes (Signed)
PPM remote 

## 2019-04-26 NOTE — H&P (Signed)
History of Present Illness [name]. [name] MD; 04/26/2019 5:06 PM) The patient is a 74 year old female who presents with a breast mass. Referred by Dr. Aura Dials for left breast complex sclerosing lesion  This is a 74 year old female status post left scalp a cholecystectomy by me in 2015. Recently she had a routine screening mammogram that showed some asymmetry in the left breast. She underwent further workup including biopsy that revealed a 0.8 x 0.2 x 0.6 cm mass at 8:00 in the left lower inner quadrant. This is located 3 cm from the nipple. Biopsy showed complex sclerosing lesion. The patient had 3 separate masses excised when she was a teenager. She thinks that these were all fibroadenomas. She presents now requesting excision of the new mass. No previous history of breast cancer. No family history of breast cancer.  The patient has a pacemaker in place and is anticoagulated for atrial fibrillation.  CLINICAL DATA: Screening.  EXAM: DIGITAL SCREENING BILATERAL MAMMOGRAM WITH TOMO AND CAD  COMPARISON: Previous exam(s).  ACR Breast Density Category c: The breast tissue is heterogeneously dense, which may obscure small masses.  FINDINGS: In the left breast, a possible asymmetry warrants further evaluation. In the right breast, no findings suspicious for malignancy. Images were processed with CAD.  IMPRESSION: Further evaluation is suggested for possible asymmetry in the left breast.  RECOMMENDATION: Diagnostic mammogram and possibly ultrasound of the left breast. (Code:[id number])  The patient will be contacted regarding the findings, and additional imaging will be scheduled.  BI-RADS CATEGORY 0: Incomplete. Need additional imaging evaluation and/or prior mammograms for comparison.   Electronically Signed By: Kristopher Oppenheim M.D. On: 03/23/2019 08:36  CLINICAL DATA: Patient returns after screening study for evaluation possible LEFT breast  asymmetry.  EXAM: DIGITAL DIAGNOSTIC LEFT MAMMOGRAM WITH CAD AND TOMO  ULTRASOUND LEFT BREAST  COMPARISON: 03/22/2019 and earlier  ACR Breast Density Category c: The breast tissue is heterogeneously dense, which may obscure small masses.  FINDINGS: Additional 2-D and 3-D images are performed. These views confirm presence of an oval mass in the MEDIAL portion of the LEFT breast, confirmed to be within the [place] on additional views. The area of asymmetry questioned in the superior portion of the LEFT breast is unremarkable on additional views.  Mammographic images were processed with CAD.  On physical exam, I palpate no abnormality in the LOWER INNER QUADRANT of the LEFT breast. Well-healed biopsy scar is identified along the inferior portion of the LEFT breast. The patient has a small keloid scar in the [place] of the LEFT breast.  Targeted ultrasound is performed, showing an oval hypoechoic mass with angular margins in the 8 o'clock location of the LEFT breast 3 centimeters from the nipple. Internal blood flow is identified on Doppler evaluation. Mass measures 0.8 x 0.2 x 0.6 centimeters. Evaluation of the LEFT axilla is negative for adenopathy.  IMPRESSION: 1. Indeterminate mass in the 8 o'clock location of the LEFT breast. Tissue diagnosis is recommended. 2. Negative LEFT axilla. 3. The patient has a pacemaker for atrial fibrillation and takes Coumadin. She will have an INR drawn next week prior to biopsy which is scheduled 07/20/2019.  RECOMMENDATION: Ultrasound-guided core biopsy of mass in the 8 o'clock location LEFT breast.  I have discussed the findings and recommendations with the patient. If applicable, a reminder letter will be sent to the patient regarding the next appointment.  BI-RADS CATEGORY 4: Suspicious.   Electronically Signed By: Nolon Nations M.D. On: 03/30/2019 15:25  CLINICAL DATA: 8 mm indeterminate mass  in the 8 o'clock position of the left breast at recent mammography and ultrasound.  EXAM: ULTRASOUND GUIDED LEFT BREAST CORE NEEDLE BIOPSY  COMPARISON: Previous exam(s).  FINDINGS: I met with the patient and we discussed the procedure of ultrasound-guided biopsy, including benefits and alternatives. We discussed the high likelihood of a successful procedure. We discussed the risks of the procedure, including infection, bleeding, tissue injury, clip migration, and inadequate sampling. Informed written consent was given. The usual time-out protocol was performed immediately prior to the procedure.  Lesion quadrant: Lower inner quadrant  Using sterile technique and 1% Lidocaine as local anesthetic, under direct ultrasound visualization, a 12 gauge spring-loaded device was used to perform biopsy of the recently demonstrated 8 mm mass in the 8 o'clock position of the left breast using a caudal approach. At the conclusion of the procedure ribbon shaped tissue marker clip was deployed into the biopsy cavity. Follow up 2 view mammogram was performed and dictated separately.  IMPRESSION: Ultrasound guided biopsy of recently demonstrated 8 mm mass in the 8 o'clock position of the left breast. No apparent complications.  Electronically Signed: By: Claudie Revering M.D. On: 04/11/2019 13:30  ADDENDUM: Pathology revealed COMPLEX SCLEROSING LESION of the LEFT breast, 8 o'clock. This was found to be concordant by Dr. Claudie Revering, with excision recommended.  Pathology results were discussed with the patient by telephone. The patient reported doing well after the biopsy with tenderness at the site. Post biopsy instructions and care were reviewed and questions were answered. The patient was encouraged to call [facility] for any additional concerns.  Per patient request, surgical consultation has been arranged with Dr. Donnie Mesa at [facility] on  April 26, 2019.  Pathology results reported by Stacie Acres, RN on 04/12/2019.   Electronically Signed By: Claudie Revering M.D. On: 04/12/2019 13:50   Problem List/Past Medical Rodman Key K. [name], MD; 04/26/2019 5:06 PM) MASS OF LEFT BREAST ON MAMMOGRAM (N63.20)  Diagnostic Studies History Emeline Gins, [place]; 04/26/2019 1:53 PM) Pap Smear >5 years ago  Allergies Emeline Gins, CMA; 04/26/2019 1:54 PM) Latex Penicillins Allergies Reconciled  Medication History Emeline Gins, CMA; 04/26/2019 1:54 PM) FLUoxetine HCl (10MG Capsule, Oral) Active. Flecainide Acetate (100MG Tablet, Oral) Active. Metoprolol Succinate ER (25MG Tablet ER 24HR, Oral) Active. Pantoprazole Sodium (20MG Tablet DR, Oral) Active. Warfarin Sodium (2.5MG Tablet, Oral) Active. clonazePAM (0.5MG Tablet, Oral) Active. Levothyroxine Sodium (88MCG Tablet, Oral) Active. Medications Reconciled  Family History Emeline Gins, [place]; 04/26/2019 1:53 PM) Kidney Disease Mother.  Pregnancy / Birth History Emeline Gins, [place]; 04/26/2019 1:53 PM) Age of menopause >60  Other Problems [name]. [name], MD; 04/26/2019 5:06 PM) Gastroesophageal Reflux Disease Heart murmur     Review of Systems Emeline Gins CMA; 04/26/2019 1:53 PM) General Not Present- Appetite Loss, Chills, Fatigue, Fever, Night Sweats, Weight Gain and Weight Loss. Skin Not Present- Change in Wart/Mole, Dryness, Hives, Jaundice, New Lesions, Non-Healing Wounds, Rash and Ulcer. HEENT Present- Hearing Loss. Not Present- Earache, Hoarseness, Nose Bleed, Oral Ulcers, Ringing in the Ears, Seasonal Allergies, Sinus Pain, Sore Throat, Visual Disturbances, Wears glasses/contact lenses and Yellow Eyes. Respiratory Not Present- Bloody sputum, Chronic Cough, Difficulty Breathing, Snoring and Wheezing. Breast Present- Breast Mass. Not Present- Breast Pain, Nipple Discharge and Skin Changes. Cardiovascular Present- Palpitations. Not Present-  Chest Pain, Difficulty Breathing Lying Down, Leg Cramps, Rapid Heart Rate, Shortness of Breath and Swelling of Extremities. Gastrointestinal Present- Hemorrhoids. Not Present- Abdominal Pain, Bloating, Bloody  Stool, Change in Bowel Habits, Chronic diarrhea, Constipation, Difficulty Swallowing, Excessive gas, Gets full quickly at meals, Indigestion, Nausea, Rectal Pain and Vomiting. Female Genitourinary Not Present- Frequency, Nocturia, Painful Urination, Pelvic Pain and Urgency. Neurological Not Present- Decreased Memory, Fainting, Headaches, Numbness, Seizures, Tingling, Tremor, Trouble walking and Weakness. Psychiatric Not Present- Anxiety, Bipolar, Change in Sleep Pattern, Depression, Fearful and Frequent crying. Endocrine Not Present- Cold Intolerance, Excessive Hunger, Hair Changes, Heat Intolerance, Hot flashes and New Diabetes. Hematology Not Present- Blood Thinners, Easy Bruising, Excessive bleeding, Gland problems, HIV and Persistent Infections.  Vitals Emeline Gins CMA; 04/26/2019 1:53 PM) 04/26/2019 1:53 PM Weight: 149.4 lb Height: 66in Body Surface Area: 1.77 m Body Mass Index: 24.11 kg/m  Temp.: 97.25F  Pulse: 95 (Regular)  BP: 118/84 (Sitting, Left Arm, Standard)        Physical Exam Rodman Key K. [name] MD; 04/26/2019 5:08 PM)  The physical exam findings are as follows: Note:WDWN in NAD Eyes: Pupils equal, round; sclera anicteric HENT: Oral mucosa moist; good dentition Neck: No masses palpated, no thyromegaly Lungs: CTA bilaterally; normal respiratory effort Breasts: symmetric; healed incisions in left breast - circumareolar, left lower inner quadrant, left lower outer quadrant. No palpable masses. No axillary lymphadenopathy. No nipple retraction or discharge. CV: Regular rate and rhythm; no murmurs; extremities well-perfused with no edema Abd: +bowel sounds, soft, non-tender, no palpable organomegaly; no palpable hernias Skin: Warm, dry; no sign of  jaundice Psychiatric - alert and oriented x 4; calm mood and affect    Assessment & Plan Rodman Key K. [name] MD; 04/26/2019 4:56 PM)  MASS OF LEFT BREAST ON MAMMOGRAM (N63.20) Impression: Complex sclerosing lesion 0800 3 cmfn - left breast  Current Plans Schedule for Surgery - Left radioactive seed localized lumpectomy. The surgical procedure has been discussed with the patient. Potential risks, benefits, alternative treatments, and expected outcomes have been explained. All of the patient's questions at this time have been answered. The likelihood of reaching the patient's treatment goal is good. The patient understand the proposed surgical procedure and wishes to proceed.  [name]. Georgette Dover, MD, [facility]  General/ Trauma Surgery   04/26/2019 5:08 PM

## 2019-04-27 ENCOUNTER — Telehealth: Payer: Self-pay | Admitting: Interventional Cardiology

## 2019-04-27 NOTE — Telephone Encounter (Signed)
New Message  Patient is interested in starting Repatha per her PCP. She is wanting to know if Dr. Tamala Julian will recommend her starting the medication. Please call to discuss.

## 2019-04-27 NOTE — Telephone Encounter (Signed)
Patient to have surgery in 2 weeks , then planning to start process for repatha.  Will get recent lipid panel from Dr March Rummage office and discuss Repatha during Coumadin Clinic follow up.

## 2019-04-27 NOTE — Telephone Encounter (Signed)
LMOM; We will be glad to work on PA for repatha per Dr Tamala Julian but need recent lipid panel results. Patient to call back and discuss therapy with PharmD.  Instructed to call Pharmacist at [facility]. She is already a patient at the [facility] coumadin clinic.

## 2019-05-03 ENCOUNTER — Ambulatory Visit: Payer: Medicare Other

## 2019-05-05 ENCOUNTER — Telehealth: Payer: Self-pay | Admitting: Interventional Cardiology

## 2019-05-05 NOTE — Telephone Encounter (Signed)
   I will route this recommendation to the requesting party via Epic fax function and remove from pre-op pool.  Please call with questions.  [place], [place] 05/05/2019, 1:55 PM

## 2019-05-05 NOTE — Telephone Encounter (Signed)
   [facility] Pre-operative Risk Assessment    Request for surgical clearance:  1. What type of surgery is being performed? Breast Mastectomy  2. When is this surgery scheduled? TBD Based on Clearance   3. What type of clearance is required (medical clearance vs. Pharmacy clearance to hold med vs. Both)? Both  4. Are there any medications that need to be held prior to surgery and how long? Warfarin, 5 days  5. Practice name and name of physician performing surgery? Dr. Donnie Mesa . [facility]  6. What is your office phone number: [phone]   7.   What is your office fax number: [phone]  8.   Anesthesia type (None, local, MAC, general) ? General    [name] 05/05/2019, 10:23 AM  _________________________________________________________________   (provider comments below)

## 2019-05-05 NOTE — Telephone Encounter (Signed)
   Primary Cardiologist:Dr. Tamala Julian EP: Dr. Rayann Heman  Chart reviewed as part of pre-operative protocol coverage. Left voice mail to call back.   [place], [place] 05/05/2019, 11:51 AM

## 2019-05-05 NOTE — Telephone Encounter (Signed)
Patient returned call.  Given past medical history and time since last visit, based on ACC/AHA guidelines, [name] would be at acceptable risk for the planned procedure without further cardiovascular testing.

## 2019-05-05 NOTE — Telephone Encounter (Signed)
Patient with diagnosis of ATRIAL FIRBILLATION on WARFARIN for anticoagulation.    Procedure: MASTECTOMY Date of procedure: TBD  CHADS2-VASc score of  5 (HTN, AGE, stroke/tia x 2, female)  CrCl = 69ml/min  Per office protocol, patient can hold WARFARIN for 5 days prior to procedure.  Patient WILL need bridging with Lovenox (enoxaparin) around procedure.

## 2019-05-05 NOTE — Telephone Encounter (Signed)
Kennyth Lose, with [facility], is calling to inquire about whether the office will order prescription for Lovenox (enoxaparin) medication for patient. [facility] will not prescribe medication. Please call to clarify at [phone].

## 2019-05-06 NOTE — Telephone Encounter (Signed)
Coumadin Clinic, we take care of ordering Lovenox for bridging prior to surgery right? [facility] called to make sure. I see that the patient has an appointment on Monday 05/09/2019 for "bridge for surgery." So I am assuming we will take care of this at that appointment but just wanted to make sure?  Thanks so much! Ugo Thoma

## 2019-05-06 NOTE — Telephone Encounter (Signed)
Yes, the coumadin clinic at [facility] will order and dose patients lovenox for her procedure.

## 2019-05-06 NOTE — Telephone Encounter (Signed)
Called and notified [facility] that patient has an appointment with our Coumadin Clinic on Monday 05/09/2019 and we would arrange for Lovenox bridge at that time.   Will remove from pre-op pool.

## 2019-05-09 ENCOUNTER — Ambulatory Visit (INDEPENDENT_AMBULATORY_CARE_PROVIDER_SITE_OTHER): Payer: Medicare Other | Admitting: Pharmacist Clinician (PhC)/ Clinical Pharmacy Specialist

## 2019-05-09 ENCOUNTER — Other Ambulatory Visit: Payer: Self-pay

## 2019-05-09 DIAGNOSIS — Z5181 Encounter for therapeutic drug level monitoring: Secondary | ICD-10-CM | POA: Diagnosis not present

## 2019-05-09 DIAGNOSIS — I480 Paroxysmal atrial fibrillation: Secondary | ICD-10-CM | POA: Diagnosis not present

## 2019-05-09 LAB — POCT INR: INR: 1.7 — AB (ref 2.0–3.0)

## 2019-05-09 NOTE — Patient Instructions (Signed)
Take 2 tablets today Monday Feb 1, then increase dose to 1 and 1/2 tablets daily except for 1 tablet every Wednesday . Next INR in 2 weeks.  Patient to call with date of procedure.  Anallely Rosell/Raquel at [phone]

## 2019-05-11 ENCOUNTER — Other Ambulatory Visit: Payer: Self-pay | Admitting: Surgery

## 2019-05-11 DIAGNOSIS — N6320 Unspecified lump in the left breast, unspecified quadrant: Secondary | ICD-10-CM

## 2019-05-12 ENCOUNTER — Ambulatory Visit: Payer: Medicare Other

## 2019-05-27 ENCOUNTER — Ambulatory Visit: Payer: Medicare Other

## 2019-05-27 ENCOUNTER — Other Ambulatory Visit: Payer: Medicare Other

## 2019-05-30 ENCOUNTER — Ambulatory Visit (INDEPENDENT_AMBULATORY_CARE_PROVIDER_SITE_OTHER): Payer: Medicare Other | Admitting: Pharmacist Clinician (PhC)/ Clinical Pharmacy Specialist

## 2019-05-30 ENCOUNTER — Other Ambulatory Visit: Payer: Self-pay

## 2019-05-30 DIAGNOSIS — Z5181 Encounter for therapeutic drug level monitoring: Secondary | ICD-10-CM

## 2019-05-30 DIAGNOSIS — I480 Paroxysmal atrial fibrillation: Secondary | ICD-10-CM | POA: Diagnosis not present

## 2019-05-30 LAB — POCT INR: INR: 1.7 — AB (ref 2.0–3.0)

## 2019-05-30 MED ORDER — ENOXAPARIN SODIUM 60 MG/0.6ML ~~LOC~~ SOLN: SUBCUTANEOUS | 0 refills | Status: DC

## 2019-05-30 NOTE — Patient Instructions (Addendum)
March 9: Last dose of Coumadin.  March 10: No Coumadin or Lovenox.  March 11: Inject Lovenox 60 mg in the fatty abdominal tissue at least 2 inches from the belly button twice a day about 12 hours apart, 8am and 8pm rotate sites. No Coumadin.  March 12: Inject Lovenox in the fatty tissue every 12 hours, 8am and 8pm. No Coumadin.  March 13: Inject Lovenox in the fatty tissue every 12 hours, 8am and 8pm. No Coumadin.  March 14: Inject Lovenox in the fatty tissue in the morning at 8 am (No PM dose). No Coumadin.  March 15: Procedure Day - No Lovenox .  No coumadin  March 16: Procedure Day - No lovenox.  Restart Coumadin in the evening -1.5 tablets  March 17: Resume Lovenox inject in the fatty tissue every 12 hours and take Coumadin - 2 tablets   March 18: Inject Lovenox in the fatty tissue every 12 hours and take Coumadin - 2 tablets  March 19: Inject Lovenox in the fatty tissue every 12 hours and take Coumadin - 2 tablets  March 20: Inject Lovenox in the fatty tissue every 12 hours and take Coumadin - 1.5 tablets  March 21: Inject Lovenox in the fatty tissue every 12 hours and take Coumadin - 1.5 tablets  March 22: Coumadin appt to check INR.

## 2019-06-10 ENCOUNTER — Other Ambulatory Visit: Payer: Self-pay

## 2019-06-10 ENCOUNTER — Ambulatory Visit (INDEPENDENT_AMBULATORY_CARE_PROVIDER_SITE_OTHER): Payer: Medicare Other | Admitting: Pharmacist Clinician (PhC)/ Clinical Pharmacy Specialist

## 2019-06-10 DIAGNOSIS — Z5181 Encounter for therapeutic drug level monitoring: Secondary | ICD-10-CM

## 2019-06-10 DIAGNOSIS — I480 Paroxysmal atrial fibrillation: Secondary | ICD-10-CM | POA: Diagnosis not present

## 2019-06-10 LAB — POCT INR: INR: 2.1 (ref 2.0–3.0)

## 2019-06-15 ENCOUNTER — Other Ambulatory Visit: Payer: Self-pay

## 2019-06-15 ENCOUNTER — Encounter (HOSPITAL_BASED_OUTPATIENT_CLINIC_OR_DEPARTMENT_OTHER): Payer: Self-pay | Admitting: Surgery

## 2019-06-15 ENCOUNTER — Encounter: Payer: Self-pay | Admitting: Internal Medicine

## 2019-06-15 DIAGNOSIS — H02835 Dermatochalasis of left lower eyelid: Secondary | ICD-10-CM | POA: Diagnosis not present

## 2019-06-15 DIAGNOSIS — H02832 Dermatochalasis of right lower eyelid: Secondary | ICD-10-CM | POA: Diagnosis not present

## 2019-06-15 DIAGNOSIS — H02054 Trichiasis without entropian left upper eyelid: Secondary | ICD-10-CM | POA: Diagnosis not present

## 2019-06-15 NOTE — Progress Notes (Signed)
[place] DEVICE PROGRAMMING  Patient Information: Name:  [name]  DOB:  1946/03/11  MRN:  [id number]    Planned Procedure: left seed guided lumpectomy  Surgeon: Dr Donne Hazel  Date of Procedure: 06/21/2019   Cautery will be used.  Position during surgery: supine   Please send documentation back to:  [facility] (Fax # [phone])   Eveline Keto, RN  06/15/2019 10:31 AM   Device Information:  Clinic EP Physician:  Thompson Grayer, MD  Device Type:  Pacemaker Manufacturer and Phone #:  Medtronic: [id number] Pacemaker Dependent?:  No. Date of Last Device Check:  04/25/19 Normal Device Function?:  Yes.    Electrophysiologist's Recommendations:   Have magnet available.  Provide continuous ECG monitoring when magnet is used or reprogramming is to be performed.   Procedure may interfere with device function.  Magnet should be placed over device during procedure.  Per [facility] Clinic [address], [place]hele [place], [place]  2:58 PM 06/15/2019

## 2019-06-17 ENCOUNTER — Other Ambulatory Visit (HOSPITAL_COMMUNITY)
Admission: RE | Admit: 2019-06-17 | Discharge: 2019-06-17 | Disposition: A | Discharge status: Home / Self Care | Payer: Medicare Other | Source: Ambulatory Visit | Attending: Surgery | Admitting: Surgery

## 2019-06-17 DIAGNOSIS — Z01812 Encounter for preprocedural laboratory examination: Secondary | ICD-10-CM | POA: Diagnosis not present

## 2019-06-17 DIAGNOSIS — Z20822 Contact with and (suspected) exposure to covid-19: Secondary | ICD-10-CM | POA: Insufficient documentation

## 2019-06-17 LAB — SARS CORONAVIRUS 2 (TAT 6-24 HRS): SARS Coronavirus 2: NEGATIVE

## 2019-06-20 ENCOUNTER — Ambulatory Visit
Admission: RE | Admit: 2019-06-20 | Discharge: 2019-06-20 | Disposition: A | Discharge status: Home / Self Care | Payer: Medicare Other | Source: Ambulatory Visit | Attending: Surgery | Admitting: Surgery

## 2019-06-20 ENCOUNTER — Other Ambulatory Visit: Payer: Self-pay

## 2019-06-20 ENCOUNTER — Encounter (HOSPITAL_BASED_OUTPATIENT_CLINIC_OR_DEPARTMENT_OTHER)
Admission: RE | Admit: 2019-06-20 | Discharge: 2019-06-20 | Disposition: A | Discharge status: Home / Self Care | Payer: Medicare Other | Source: Ambulatory Visit | Attending: Surgery | Admitting: Surgery

## 2019-06-20 DIAGNOSIS — Z01812 Encounter for preprocedural laboratory examination: Secondary | ICD-10-CM | POA: Diagnosis not present

## 2019-06-20 DIAGNOSIS — R928 Other abnormal and inconclusive findings on diagnostic imaging of breast: Secondary | ICD-10-CM | POA: Diagnosis not present

## 2019-06-20 DIAGNOSIS — N6320 Unspecified lump in the left breast, unspecified quadrant: Secondary | ICD-10-CM

## 2019-06-20 LAB — PROTIME-INR
INR: 1.1 (ref 0.8–1.2)
Prothrombin Time: 13.9 seconds (ref 11.4–15.2)

## 2019-06-20 NOTE — Anesthesia Preprocedure Evaluation (Addendum)
Anesthesia Evaluation  Patient identified by MRN, date of birth, ID band Patient awake    Reviewed: Allergy & Precautions, NPO status , Patient's Chart, lab work & pertinent test results  History of Anesthesia Complications (+) PONV  Airway Mallampati: II  TM Distance: >3 FB Neck ROM: Full    Dental no notable dental hx. (+) Teeth Intact, Dental Advisory Given   Pulmonary COPD,    Pulmonary exam normal breath sounds clear to auscultation       Cardiovascular hypertension, Pt. on medications Normal cardiovascular exam+ pacemaker  Rhythm:Regular Rate:Normal     Neuro/Psych  Headaches, Anxiety    GI/Hepatic Neg liver ROS, hiatal hernia, GERD  ,  Endo/Other  Hypothyroidism   Renal/GU negative Renal ROS     Musculoskeletal negative musculoskeletal ROS (+)   Abdominal   Peds  Hematology   Anesthesia Other Findings   Reproductive/Obstetrics                            Anesthesia Physical Anesthesia Plan  ASA: III  Anesthesia Plan: General   Post-op Pain Management:    Induction: Intravenous  PONV Risk Score and Plan: Ondansetron, Dexamethasone, Propofol infusion and Treatment may vary due to age or medical condition  Airway Management Planned: LMA  Additional Equipment: None  Intra-op Plan:   Post-operative Plan:   Informed Consent: I have reviewed the patients History and Physical, chart, labs and discussed the procedure including the risks, benefits and alternatives for the proposed anesthesia with the patient or authorized representative who has indicated his/her understanding and acceptance.     Dental advisory given  Plan Discussed with: CRNA  Anesthesia Plan Comments: (LMA w TIVA)       Anesthesia Quick Evaluation

## 2019-06-20 NOTE — Progress Notes (Signed)

## 2019-06-21 ENCOUNTER — Other Ambulatory Visit: Payer: Self-pay | Admitting: Interventional Cardiology

## 2019-06-21 ENCOUNTER — Ambulatory Visit
Admission: RE | Admit: 2019-06-21 | Discharge: 2019-06-21 | Disposition: A | Discharge status: Home / Self Care | Payer: Medicare Other | Source: Ambulatory Visit | Attending: Surgery | Admitting: Surgery

## 2019-06-21 ENCOUNTER — Encounter (HOSPITAL_BASED_OUTPATIENT_CLINIC_OR_DEPARTMENT_OTHER)
Admission: RE | Disposition: A | Discharge status: Home / Self Care | Payer: Self-pay | Source: Home / Self Care | Attending: Surgery

## 2019-06-21 ENCOUNTER — Encounter (HOSPITAL_BASED_OUTPATIENT_CLINIC_OR_DEPARTMENT_OTHER): Payer: Self-pay | Admitting: Surgery

## 2019-06-21 ENCOUNTER — Ambulatory Visit (HOSPITAL_BASED_OUTPATIENT_CLINIC_OR_DEPARTMENT_OTHER): Payer: Medicare Other | Admitting: Anesthesiology

## 2019-06-21 ENCOUNTER — Other Ambulatory Visit: Payer: Self-pay

## 2019-06-21 ENCOUNTER — Ambulatory Visit (HOSPITAL_BASED_OUTPATIENT_CLINIC_OR_DEPARTMENT_OTHER)
Admission: RE | Admit: 2019-06-21 | Discharge: 2019-06-21 | Disposition: A | Discharge status: Home / Self Care | Payer: Medicare Other | Attending: Surgery | Admitting: Surgery

## 2019-06-21 DIAGNOSIS — Z79899 Other long term (current) drug therapy: Secondary | ICD-10-CM | POA: Diagnosis not present

## 2019-06-21 DIAGNOSIS — K219 Gastro-esophageal reflux disease without esophagitis: Secondary | ICD-10-CM | POA: Insufficient documentation

## 2019-06-21 DIAGNOSIS — N6489 Other specified disorders of breast: Secondary | ICD-10-CM | POA: Diagnosis not present

## 2019-06-21 DIAGNOSIS — Z79890 Hormone replacement therapy (postmenopausal): Secondary | ICD-10-CM | POA: Insufficient documentation

## 2019-06-21 DIAGNOSIS — I480 Paroxysmal atrial fibrillation: Secondary | ICD-10-CM | POA: Insufficient documentation

## 2019-06-21 DIAGNOSIS — R921 Mammographic calcification found on diagnostic imaging of breast: Secondary | ICD-10-CM | POA: Diagnosis not present

## 2019-06-21 DIAGNOSIS — D242 Benign neoplasm of left breast: Secondary | ICD-10-CM | POA: Insufficient documentation

## 2019-06-21 DIAGNOSIS — J449 Chronic obstructive pulmonary disease, unspecified: Secondary | ICD-10-CM | POA: Insufficient documentation

## 2019-06-21 DIAGNOSIS — Z950 Presence of cardiac pacemaker: Secondary | ICD-10-CM | POA: Diagnosis not present

## 2019-06-21 DIAGNOSIS — E039 Hypothyroidism, unspecified: Secondary | ICD-10-CM | POA: Insufficient documentation

## 2019-06-21 DIAGNOSIS — I10 Essential (primary) hypertension: Secondary | ICD-10-CM | POA: Diagnosis not present

## 2019-06-21 DIAGNOSIS — R928 Other abnormal and inconclusive findings on diagnostic imaging of breast: Secondary | ICD-10-CM | POA: Diagnosis not present

## 2019-06-21 DIAGNOSIS — F419 Anxiety disorder, unspecified: Secondary | ICD-10-CM | POA: Diagnosis not present

## 2019-06-21 DIAGNOSIS — E7800 Pure hypercholesterolemia, unspecified: Secondary | ICD-10-CM | POA: Diagnosis not present

## 2019-06-21 DIAGNOSIS — N6320 Unspecified lump in the left breast, unspecified quadrant: Secondary | ICD-10-CM

## 2019-06-21 DIAGNOSIS — Z7901 Long term (current) use of anticoagulants: Secondary | ICD-10-CM | POA: Insufficient documentation

## 2019-06-21 HISTORY — PX: BREAST LUMPECTOMY WITH RADIOACTIVE SEED LOCALIZATION: SHX6424

## 2019-06-21 SURGERY — BREAST LUMPECTOMY WITH RADIOACTIVE SEED LOCALIZATION
Anesthesia: General | Site: Breast | Laterality: Left

## 2019-06-21 MED ORDER — LACTATED RINGERS IV SOLN: INTRAVENOUS | Status: DC

## 2019-06-21 MED ORDER — ACETAMINOPHEN 500 MG PO TABS: 1000.0000 mg | ORAL_TABLET | ORAL | Status: DC

## 2019-06-21 MED ORDER — FENTANYL CITRATE (PF) 100 MCG/2ML IJ SOLN
INTRAMUSCULAR | Status: AC
  Filled 2019-06-21: qty 2

## 2019-06-21 MED ORDER — ONDANSETRON HCL 4 MG/2ML IJ SOLN: 4.0000 mg | Freq: Once | INTRAMUSCULAR | Status: DC | PRN

## 2019-06-21 MED ORDER — FENTANYL CITRATE (PF) 100 MCG/2ML IJ SOLN
25.0000 ug | INTRAMUSCULAR | Status: DC | PRN
  Administered 2019-06-21 (×2): 25 ug via INTRAVENOUS

## 2019-06-21 MED ORDER — ACETAMINOPHEN 500 MG PO TABS
ORAL_TABLET | ORAL | Status: AC
  Filled 2019-06-21: qty 2

## 2019-06-21 MED ORDER — PROPOFOL 10 MG/ML IV BOLUS
INTRAVENOUS | Status: DC | PRN
  Administered 2019-06-21: 70 mg via INTRAVENOUS

## 2019-06-21 MED ORDER — FENTANYL CITRATE (PF) 100 MCG/2ML IJ SOLN: 50.0000 ug | INTRAMUSCULAR | Status: DC | PRN

## 2019-06-21 MED ORDER — PHENYLEPHRINE 40 MCG/ML (10ML) SYRINGE FOR IV PUSH (FOR BLOOD PRESSURE SUPPORT)
PREFILLED_SYRINGE | INTRAVENOUS | Status: AC
  Filled 2019-06-21: qty 10

## 2019-06-21 MED ORDER — DEXAMETHASONE SODIUM PHOSPHATE 10 MG/ML IJ SOLN
INTRAMUSCULAR | Status: DC | PRN
  Administered 2019-06-21: 5 mg via INTRAVENOUS

## 2019-06-21 MED ORDER — GABAPENTIN 300 MG PO CAPS
ORAL_CAPSULE | ORAL | Status: AC
  Filled 2019-06-21: qty 1

## 2019-06-21 MED ORDER — DEXAMETHASONE SODIUM PHOSPHATE 10 MG/ML IJ SOLN: 4.0000 mg | INTRAMUSCULAR | Status: DC

## 2019-06-21 MED ORDER — DEXMEDETOMIDINE HCL IN NACL 200 MCG/50ML IV SOLN
INTRAVENOUS | Status: DC | PRN
  Administered 2019-06-21: 6 ug via INTRAVENOUS

## 2019-06-21 MED ORDER — ACETAMINOPHEN 500 MG PO TABS
1000.0000 mg | ORAL_TABLET | Freq: Once | ORAL | Status: AC
  Administered 2019-06-21: 08:00:00 1000 mg via ORAL

## 2019-06-21 MED ORDER — SCOPOLAMINE 1 MG/3DAYS TD PT72: 1.0000 | MEDICATED_PATCH | TRANSDERMAL | Status: DC

## 2019-06-21 MED ORDER — CEFAZOLIN SODIUM-DEXTROSE 2-4 GM/100ML-% IV SOLN
INTRAVENOUS | Status: AC
  Filled 2019-06-21: qty 100

## 2019-06-21 MED ORDER — PROPOFOL 10 MG/ML IV BOLUS
INTRAVENOUS | Status: AC
  Filled 2019-06-21: qty 20

## 2019-06-21 MED ORDER — CHLORHEXIDINE GLUCONATE CLOTH 2 % EX PADS: 6.0000 | MEDICATED_PAD | Freq: Once | CUTANEOUS | Status: DC

## 2019-06-21 MED ORDER — GABAPENTIN 300 MG PO CAPS
300.0000 mg | ORAL_CAPSULE | ORAL | Status: AC
  Administered 2019-06-21: 300 mg via ORAL

## 2019-06-21 MED ORDER — HYDROCODONE-ACETAMINOPHEN 5-325 MG PO TABS: 1.0000 | ORAL_TABLET | Freq: Four times a day (QID) | ORAL | 0 refills | Status: DC | PRN

## 2019-06-21 MED ORDER — DEXAMETHASONE SODIUM PHOSPHATE 10 MG/ML IJ SOLN
INTRAMUSCULAR | Status: AC
  Filled 2019-06-21: qty 1

## 2019-06-21 MED ORDER — ONDANSETRON HCL 4 MG/2ML IJ SOLN
INTRAMUSCULAR | Status: AC
  Filled 2019-06-21: qty 4

## 2019-06-21 MED ORDER — CEFAZOLIN SODIUM-DEXTROSE 2-4 GM/100ML-% IV SOLN
2.0000 g | INTRAVENOUS | Status: AC
  Administered 2019-06-21: 2 g via INTRAVENOUS

## 2019-06-21 MED ORDER — LIDOCAINE 2% (20 MG/ML) 5 ML SYRINGE
INTRAMUSCULAR | Status: DC | PRN
  Administered 2019-06-21: 100 mg via INTRAVENOUS

## 2019-06-21 MED ORDER — ROCURONIUM BROMIDE 10 MG/ML (PF) SYRINGE
PREFILLED_SYRINGE | INTRAVENOUS | Status: AC
  Filled 2019-06-21: qty 10

## 2019-06-21 MED ORDER — PROPOFOL 500 MG/50ML IV EMUL
INTRAVENOUS | Status: DC | PRN
  Administered 2019-06-21: 25 ug/kg/min via INTRAVENOUS

## 2019-06-21 MED ORDER — BUPIVACAINE HCL (PF) 0.25 % IJ SOLN
INTRAMUSCULAR | Status: DC | PRN
  Administered 2019-06-21: 10 mL

## 2019-06-21 MED ORDER — MIDAZOLAM HCL 2 MG/2ML IJ SOLN: 1.0000 mg | INTRAMUSCULAR | Status: DC | PRN

## 2019-06-21 MED ORDER — LIDOCAINE 2% (20 MG/ML) 5 ML SYRINGE
INTRAMUSCULAR | Status: AC
  Filled 2019-06-21: qty 10

## 2019-06-21 MED ORDER — ONDANSETRON HCL 4 MG/2ML IJ SOLN
INTRAMUSCULAR | Status: DC | PRN
  Administered 2019-06-21: 4 mg via INTRAVENOUS

## 2019-06-21 MED ORDER — ACETAMINOPHEN 10 MG/ML IV SOLN: 1000.0000 mg | Freq: Once | INTRAVENOUS | Status: DC | PRN

## 2019-06-21 MED ORDER — FENTANYL CITRATE (PF) 100 MCG/2ML IJ SOLN
INTRAMUSCULAR | Status: DC | PRN
  Administered 2019-06-21: 25 ug via INTRAVENOUS

## 2019-06-21 SURGICAL SUPPLY — 54 items
APL PRP STRL LF DISP 70% ISPRP (MISCELLANEOUS) ×1
APL SKNCLS STERI-STRIP NONHPOA (GAUZE/BANDAGES/DRESSINGS) ×1
APPLIER CLIP 9.375 MED OPEN (MISCELLANEOUS)
APR CLP MED 9.3 20 MLT OPN (MISCELLANEOUS)
BENZOIN TINCTURE PRP APPL 2/3 (GAUZE/BANDAGES/DRESSINGS) ×2 IMPLANT
BLADE HEX COATED 2.75 (ELECTRODE) ×2 IMPLANT
BLADE SURG 15 STRL LF DISP TIS (BLADE) ×1 IMPLANT
BLADE SURG 15 STRL SS (BLADE) ×2
CANISTER SUC SOCK COL 7IN (MISCELLANEOUS) IMPLANT
CANISTER SUCT 1200ML W/VALVE (MISCELLANEOUS) IMPLANT
CHLORAPREP W/TINT 26 (MISCELLANEOUS) ×2 IMPLANT
CLIP APPLIE 9.375 MED OPEN (MISCELLANEOUS) ×1 IMPLANT
COVER BACK TABLE 60X90IN (DRAPES) ×2 IMPLANT
COVER MAYO STAND STRL (DRAPES) ×2 IMPLANT
COVER PROBE W GEL 5X96 (DRAPES) ×2 IMPLANT
COVER WAND RF STERILE (DRAPES) IMPLANT
DECANTER SPIKE VIAL GLASS SM (MISCELLANEOUS) IMPLANT
DRAPE LAPAROTOMY 100X72 PEDS (DRAPES) ×2 IMPLANT
DRAPE UTILITY XL STRL (DRAPES) ×2 IMPLANT
DRSG TEGADERM 4X4.75 (GAUZE/BANDAGES/DRESSINGS) ×2 IMPLANT
ELECT REM PT RETURN 9FT ADLT (ELECTROSURGICAL) ×2
ELECTRODE REM PT RTRN 9FT ADLT (ELECTROSURGICAL) ×1 IMPLANT
GAUZE SPONGE 4X4 12PLY STRL LF (GAUZE/BANDAGES/DRESSINGS) IMPLANT
GLOVE BIOGEL PI IND STRL 7.0 (GLOVE) IMPLANT
GLOVE BIOGEL PI IND STRL 7.5 (GLOVE) ×1 IMPLANT
GLOVE BIOGEL PI INDICATOR 7.0 (GLOVE) ×2
GLOVE BIOGEL PI INDICATOR 7.5 (GLOVE) ×1
GLOVE SURG SS PI 6.5 STRL IVOR (GLOVE) ×1 IMPLANT
GLOVE SURG SS PI 7.0 STRL IVOR (GLOVE) ×2 IMPLANT
GOWN STRL REUS W/ TWL LRG LVL3 (GOWN DISPOSABLE) ×2 IMPLANT
GOWN STRL REUS W/TWL LRG LVL3 (GOWN DISPOSABLE) ×4
ILLUMINATOR WAVEGUIDE N/F (MISCELLANEOUS) IMPLANT
KIT MARKER MARGIN INK (KITS) ×2 IMPLANT
LIGHT WAVEGUIDE WIDE FLAT (MISCELLANEOUS) IMPLANT
NDL HYPO 25X1 1.5 SAFETY (NEEDLE) ×1 IMPLANT
NEEDLE HYPO 25X1 1.5 SAFETY (NEEDLE) ×2 IMPLANT
NS IRRIG 1000ML POUR BTL (IV SOLUTION) ×2 IMPLANT
PACK BASIN DAY SURGERY FS (CUSTOM PROCEDURE TRAY) ×2 IMPLANT
PENCIL SMOKE EVACUATOR (MISCELLANEOUS) ×2 IMPLANT
SLEEVE SCD COMPRESS KNEE MED (MISCELLANEOUS) ×2 IMPLANT
SPONGE GAUZE 2X2 8PLY STRL LF (GAUZE/BANDAGES/DRESSINGS) IMPLANT
SPONGE LAP 18X18 RF (DISPOSABLE) IMPLANT
SPONGE LAP 4X18 RFD (DISPOSABLE) ×2 IMPLANT
STRIP CLOSURE SKIN 1/2X4 (GAUZE/BANDAGES/DRESSINGS) ×2 IMPLANT
SUT MON AB 4-0 PC3 18 (SUTURE) ×2 IMPLANT
SUT SILK 2 0 SH (SUTURE) IMPLANT
SUT VIC AB 3-0 SH 27 (SUTURE) ×2
SUT VIC AB 3-0 SH 27X BRD (SUTURE) ×1 IMPLANT
SYR BULB 3OZ (MISCELLANEOUS) ×1 IMPLANT
SYR CONTROL 10ML LL (SYRINGE) ×2 IMPLANT
TOWEL GREEN STERILE FF (TOWEL DISPOSABLE) ×2 IMPLANT
TRAY FAXITRON CT DISP (TRAY / TRAY PROCEDURE) ×2 IMPLANT
TUBE CONNECTING 20X1/4 (TUBING) IMPLANT
YANKAUER SUCT BULB TIP NO VENT (SUCTIONS) IMPLANT

## 2019-06-21 NOTE — Transfer of Care (Signed)
Immediate Anesthesia Transfer of Care Note  Patient: [name]  Procedure(s) Performed: LEFT BREAST LUMPECTOMY WITH RADIOACTIVE SEED LOCALIZATION (Left Breast)  Patient Location: PACU  Anesthesia Type:General  Level of Consciousness: drowsy  Airway & Oxygen Therapy: Patient Spontanous Breathing and Patient connected to nasal cannula oxygen  Post-op Assessment: Report given to RN and Post -op Vital signs reviewed and stable  Post vital signs: Reviewed and stable  Last Vitals:  Vitals Value Taken Time  BP    Temp    Pulse 62 06/21/19 1054  Resp 11 06/21/19 1054  SpO2 100 % 06/21/19 1054  Vitals shown include unvalidated device data.  Last Pain:  Vitals:   06/21/19 0810  TempSrc: Oral  PainSc: 0-No pain      Patients Stated Pain Goal: 2 ([id number])  Complications: No apparent anesthesia complications

## 2019-06-21 NOTE — Op Note (Signed)
Pre-op Diagnosis:  Complex sclerosing lesion Post-op Diagnosis: same Procedure:  Left radioactive seed localized lumpectomy Surgeon:  Kyng Matlock K. Anesthesia:  GEN - LMA Indications:  This is a 74 year old female status post laparoscopic cholecystectomy by me in 2015. Recently she had a routine screening mammogram that showed some asymmetry in the left breast. She underwent further workup including biopsy that revealed a 0.8 x 0.2 x 0.6 cm mass at 8:00 in the left lower inner quadrant. This is located 3 cm from the nipple. Biopsy showed complex sclerosing lesion. The patient had 3 separate masses excised when she was a teenager. She thinks that these were all fibroadenomas. She presents now requesting excision of the new mass. No previous history of breast cancer. No family history of breast cancer.  The patient has a pacemaker in place and is anticoagulated for atrial fibrillation.  Description of procedure: The patient is brought to the operating room placed in supine position on the operating room table. After an adequate level of general anesthesia was obtained, her left breast was prepped with ChloraPrep and draped in sterile fashion. A timeout was taken to ensure the proper patient and proper procedure. We interrogated the breast with the neoprobe.  The seed is located in the left lower inner quadrant.  She has a previous transverse incision adjacent to the scar.  I decided to use this incision. We made an incision through this scar after infiltrating with 0.25% Marcaine. Dissection was carried down in the breast tissue with cautery. We used the neoprobe to guide [place] towards the radioactive seed. We excised an area of tissue around the radioactive seed 1.5 cm in diameter, down to the chest wall. The specimen was removed and was oriented with a paint kit. Specimen mammogram showed the radioactive seed as well as the biopsy clip within the specimen. This was sent for pathologic examination. There is no  residual radioactivity within the biopsy cavity. We inspected carefully for hemostasis. The wound was thoroughly irrigated. The wound was closed with a deep layer of 3-0 Vicryl and a subcuticular layer of 4-0 Monocryl. Benzoin Steri-Strips were applied. The patient was then extubated and brought to the recovery room in stable condition. All sponge, instrument, and needle counts are correct.  Imogene Burn. Georgette Dover, MD, [facility]  General/ Trauma Surgery  06/21/2019 10:45 AM

## 2019-06-21 NOTE — H&P (Signed)
History of Present Illness  The patient is a 74 year old female who presents with a breast mass. Referred by Dr. Aura Dials for left breast complex sclerosing lesion  This is a 74 year old female status post left scalp a cholecystectomy by me in 2015. Recently she had a routine screening mammogram that showed some asymmetry in the left breast. She underwent further workup including biopsy that revealed a 0.8 x 0.2 x 0.6 cm mass at 8:00 in the left lower inner quadrant. This is located 3 cm from the nipple. Biopsy showed complex sclerosing lesion. The patient had 3 separate masses excised when she was a teenager. She thinks that these were all fibroadenomas. She presents now requesting excision of the new mass. No previous history of breast cancer. No family history of breast cancer.  The patient has a pacemaker in place and is anticoagulated for atrial fibrillation.  CLINICAL DATA: Screening.  EXAM:  DIGITAL SCREENING BILATERAL MAMMOGRAM WITH TOMO AND CAD  COMPARISON: Previous exam(s).  ACR Breast Density Category c: The breast tissue is heterogeneously  dense, which may obscure small masses.  FINDINGS:  In the left breast, a possible asymmetry warrants further  evaluation. In the right breast, no findings suspicious for  malignancy. Images were processed with CAD.  IMPRESSION:  Further evaluation is suggested for possible asymmetry in the left  breast.  RECOMMENDATION:  Diagnostic mammogram and possibly ultrasound of the left breast.  (Code:[id number])  The patient will be contacted regarding the findings, and additional  imaging will be scheduled.  BI-RADS CATEGORY 0: Incomplete. Need additional imaging evaluation  and/or prior mammograms for comparison.  Electronically Signed  By: Kristopher Oppenheim M.D.  On: 03/23/2019 08:36  CLINICAL DATA: Patient returns after screening study for evaluation  possible LEFT breast asymmetry.  EXAM:  DIGITAL DIAGNOSTIC LEFT MAMMOGRAM WITH CAD AND TOMO   ULTRASOUND LEFT BREAST  COMPARISON: 03/22/2019 and earlier  ACR Breast Density Category c: The breast tissue is heterogeneously  dense, which may obscure small masses.  FINDINGS:  Additional 2-D and 3-D images are performed. These views confirm  presence of an oval mass in the MEDIAL portion of the LEFT breast,  confirmed to be within the [place] on additional views.  The area of asymmetry questioned in the superior portion of the LEFT  breast is unremarkable on additional views.  Mammographic images were processed with CAD.  On physical exam, I palpate no abnormality in the LOWER INNER  QUADRANT of the LEFT breast. Well-healed biopsy scar is identified  along the inferior portion of the LEFT breast. The patient has a  small keloid scar in the [place] of the LEFT breast.  Targeted ultrasound is performed, showing an oval hypoechoic mass  with angular margins in the 8 o'clock location of the LEFT breast 3  centimeters from the nipple. Internal blood flow is identified on  Doppler evaluation. Mass measures 0.8 x 0.2 x 0.6 centimeters.  Evaluation of the LEFT axilla is negative for adenopathy.  IMPRESSION:  1. Indeterminate mass in the 8 o'clock location of the LEFT breast.  Tissue diagnosis is recommended.  2. Negative LEFT axilla.  3. The patient has a pacemaker for atrial fibrillation and takes  Coumadin. She will have an INR drawn next week prior to biopsy which  is scheduled 07/20/2019.  RECOMMENDATION:  Ultrasound-guided core biopsy of mass in the 8 o'clock location LEFT  breast.  I have discussed the findings and recommendations with the patient.  If applicable,  a reminder letter will be sent to the patient  regarding the next appointment.  BI-RADS CATEGORY 4: Suspicious.  Electronically Signed  By: Nolon Nations M.D.  On: 03/30/2019 15:25  CLINICAL DATA: 8 mm indeterminate mass in the 8 o'clock position of  the left breast at recent mammography  and ultrasound.  EXAM:  ULTRASOUND GUIDED LEFT BREAST CORE NEEDLE BIOPSY  COMPARISON: Previous exam(s).  FINDINGS:  I met with the patient and we discussed the procedure of  ultrasound-guided biopsy, including benefits and alternatives. We  discussed the high likelihood of a successful procedure. We  discussed the risks of the procedure, including infection, bleeding,  tissue injury, clip migration, and inadequate sampling. Informed  written consent was given. The usual time-out protocol was performed  immediately prior to the procedure.  Lesion quadrant: Lower inner quadrant  Using sterile technique and 1% Lidocaine as local anesthetic, under  direct ultrasound visualization, a 12 gauge spring-loaded device was  used to perform biopsy of the recently demonstrated 8 mm mass in the  8 o'clock position of the left breast using a caudal approach. At  the conclusion of the procedure ribbon shaped tissue marker clip was  deployed into the biopsy cavity. Follow up 2 view mammogram was  performed and dictated separately.  IMPRESSION:  Ultrasound guided biopsy of recently demonstrated 8 mm mass in the 8  o'clock position of the left breast. No apparent complications.  Electronically Signed:  By: Claudie Revering M.D.  On: 04/11/2019 13:30  ADDENDUM:  Pathology revealed COMPLEX SCLEROSING LESION of the LEFT breast, 8  o'clock. This was found to be concordant by Dr. Claudie Revering, with  excision recommended.  Pathology results were discussed with the patient by telephone. The  patient reported doing well after the biopsy with tenderness at the  site. Post biopsy instructions and care were reviewed and questions  were answered. The patient was encouraged to call [facility] for any additional concerns.  Per patient request, surgical consultation has been arranged with  Dr. Donnie Mesa at [facility] on April 26, 2019.  Pathology results reported by  Stacie Acres, RN on 04/12/2019.  Electronically Signed  By: Claudie Revering M.D.  On: 04/12/2019 13:50  Problem List/Past Medical  MASS OF LEFT BREAST ON MAMMOGRAM (N63.20)  Diagnostic Studies History  Pap Smear >5 years ago  Allergies   Latex  Penicillins  Allergies Reconciled  Medication History  FLUoxetine HCl (10MG Capsule, Oral) Active.  Flecainide Acetate (100MG Tablet, Oral) Active.  Metoprolol Succinate ER (25MG Tablet ER 24HR, Oral) Active.  Pantoprazole Sodium (20MG Tablet DR, Oral) Active.  Warfarin Sodium (2.5MG Tablet, Oral) Active.  clonazePAM (0.5MG Tablet, Oral) Active.  Levothyroxine Sodium (88MCG Tablet, Oral) Active.  Medications Reconciled  Family History  Kidney Disease Mother.  Pregnancy / Birth History Age of menopause >60  Other Problems  Gastroesophageal Reflux Disease  Heart murmur  Review of Systems  General Not Present- Appetite Loss, Chills, Fatigue, Fever, Night Sweats, Weight Gain and Weight Loss.  Skin Not Present- Change in Wart/Mole, Dryness, Hives, Jaundice, New Lesions, Non-Healing Wounds, Rash and Ulcer.  HEENT Present- Hearing Loss. Not Present- Earache, Hoarseness, Nose Bleed, Oral Ulcers, Ringing in the Ears, Seasonal Allergies, Sinus Pain, Sore Throat, Visual Disturbances, Wears glasses/contact lenses and Yellow Eyes.  Respiratory Not Present- Bloody sputum, Chronic Cough, Difficulty Breathing, Snoring and Wheezing.  Breast Present- Breast Mass. Not Present- Breast Pain, Nipple Discharge and Skin Changes.  Cardiovascular Present- Palpitations. Not  Present- Chest Pain, Difficulty Breathing Lying Down, Leg Cramps, Rapid Heart Rate, Shortness of Breath and Swelling of Extremities.  Gastrointestinal Present- Hemorrhoids. Not Present- Abdominal Pain, Bloating, Bloody Stool, Change in Bowel Habits, Chronic diarrhea, Constipation, Difficulty Swallowing, Excessive gas, Gets full quickly at meals, Indigestion, Nausea, Rectal Pain and Vomiting.  Female  Genitourinary Not Present- Frequency, Nocturia, Painful Urination, Pelvic Pain and Urgency.  Neurological Not Present- Decreased Memory, Fainting, Headaches, Numbness, Seizures, Tingling, Tremor, Trouble walking and Weakness.  Psychiatric Not Present- Anxiety, Bipolar, Change in Sleep Pattern, Depression, Fearful and Frequent crying.  Endocrine Not Present- Cold Intolerance, Excessive Hunger, Hair Changes, Heat Intolerance, Hot flashes and New Diabetes.  Hematology Not Present- Blood Thinners, Easy Bruising, Excessive bleeding, Gland problems, HIV and Persistent Infections.  Vitals  Weight: 149.4 lb Height: 66 in  Body Surface Area: 1.77 m Body Mass Index: 24.11 kg/m  Temp.: 97.5 F Pulse: 95 (Regular)  BP: 118/84 (Sitting, Left Arm, Standard)  Physical Exam Rodman Key K. Zolton Dowson MD; 04/26/2019 5:08 PM)  The physical exam findings are as follows:  Note: WDWN in NAD  Eyes: Pupils equal, round; sclera anicteric  HENT: Oral mucosa moist; good dentition  Neck: No masses palpated, no thyromegaly  Lungs: CTA bilaterally; normal respiratory effort  Breasts: symmetric; healed incisions in left breast - circumareolar, left lower inner quadrant, left lower outer quadrant. No palpable masses. No axillary lymphadenopathy. No nipple retraction or discharge.  CV: Regular rate and rhythm; no murmurs; extremities well-perfused with no edema  Abd: +bowel sounds, soft, non-tender, no palpable organomegaly; no palpable hernias  Skin: Warm, dry; no sign of jaundice  Psychiatric - alert and oriented x 4; calm mood and affect  Assessment & Plan  MASS OF LEFT BREAST ON MAMMOGRAM (N63.20)  Impression: Complex sclerosing lesion 0800 3 cmfn - left breast  Current Plans  Schedule for Surgery - Left radioactive seed localized lumpectomy. The surgical procedure has been discussed with the patient. Potential risks, benefits, alternative treatments, and expected outcomes have been explained. All of the patient's questions  at this time have been answered. The likelihood of reaching the patient's treatment goal is good. The patient understand the proposed surgical procedure and wishes to proceed.    Imogene Burn. Georgette Dover, MD, [facility]  General/ Trauma Surgery   06/21/2019 7:53 AM

## 2019-06-21 NOTE — Discharge Instructions (Signed)
Next dose of Tylenol at 2:15 PM   [employer] Phone Number [phone]  BREAST BIOPSY/ PARTIAL MASTECTOMY: POST OP INSTRUCTIONS  Always review your discharge instruction sheet given to you by the facility where your surgery was performed.  IF YOU HAVE DISABILITY OR FAMILY LEAVE FORMS, YOU MUST BRING THEM TO THE OFFICE FOR PROCESSING.  DO NOT GIVE THEM TO YOUR DOCTOR.  1. A prescription for pain medication may be given to you upon discharge.  Take your pain medication as prescribed, if needed.  If narcotic pain medicine is not needed, then you may take acetaminophen (Tylenol) or ibuprofen (Advil) as needed. 2. Take your usually prescribed medications unless otherwise directed 3. If you need a refill on your pain medication, please contact your pharmacy.  They will contact our office to request authorization.  Prescriptions will not be filled after 5pm or on week-ends. 4. You should eat very light the first 24 hours after surgery, such as soup, crackers, pudding, etc.  Resume your normal diet the day after surgery. 5. Most patients will experience some swelling and bruising in the breast.  Ice packs and a good support bra will help.  Swelling and bruising can take several days to resolve.  6. It is common to experience some constipation if taking pain medication after surgery.  Increasing fluid intake and taking a stool softener will usually help or prevent this problem from occurring.  A mild laxative (Milk of Magnesia or Miralax) should be taken according to package directions if there are no bowel movements after 48 hours. 7. Unless discharge instructions indicate otherwise, you may remove your bandages 24-48 hours after surgery, and you may shower at that time.  You may have steri-strips (small skin tapes) in place directly over the incision.  These strips should be left on the skin for 7-10 days.  If your surgeon used skin glue on the incision, you may shower in 24 hours.   The glue will flake off over the next 2-3 weeks.  Any sutures or staples will be removed at the office during your follow-up visit. 8. ACTIVITIES:  You may resume regular daily activities (gradually increasing) beginning the next day.  Wearing a good support bra or sports bra minimizes pain and swelling.  You may have sexual intercourse when it is comfortable. a. You may drive when you no longer are taking prescription pain medication, you can comfortably wear a seatbelt, and you can safely maneuver your car and apply brakes. b. RETURN TO WORK:  ______________________________________________________________________________________ 9. You should see your doctor in the office for a follow-up appointment approximately two weeks after your surgery.  Your doctor's nurse will typically make your follow-up appointment when she calls you with your pathology report.  Expect your pathology report 2-3 business days after your surgery.  You may call to check if you do not hear from [place] after three days. 10. OTHER INSTRUCTIONS: _______________________________________________________________________________________________ _____________________________________________________________________________________________________________________________________ _____________________________________________________________________________________________________________________________________ _____________________________________________________________________________________________________________________________________  WHEN TO CALL YOUR DOCTOR: 1. Fever over 101.0 2. Nausea and/or vomiting. 3. Extreme swelling or bruising. 4. Continued bleeding from incision. 5. Increased pain, redness, or drainage from the incision.  The clinic staff is available to answer your questions during regular business hours.  Please don't hesitate to call and ask to speak to one of the nurses for clinical concerns.  If you have a medical  emergency, go to the nearest emergency room or call 911.  A surgeon from [facility] is always on call at the  hospital.  For further questions, please visit [email or web address]     Post Anesthesia Home Care Instructions  Activity: Get plenty of rest for the remainder of the day. A responsible individual must stay with you for 24 hours following the procedure.  For the next 24 hours, DO NOT: -Drive a car -Paediatric nurse -Drink alcoholic beverages -Take any medication unless instructed by your physician -Make any legal decisions or sign important papers.  Meals: Start with liquid foods such as gelatin or soup. Progress to regular foods as tolerated. Avoid greasy, spicy, heavy foods. If nausea and/or vomiting occur, drink only clear liquids until the nausea and/or vomiting subsides. Call your physician if vomiting continues.  Special Instructions/Symptoms: Your throat may feel dry or sore from the anesthesia or the breathing tube placed in your throat during surgery. If this causes discomfort, gargle with warm salt water. The discomfort should disappear within 24 hours.  If you had a scopolamine patch placed behind your ear for the management of post- operative nausea and/or vomiting:  1. The medication in the patch is effective for 72 hours, after which it should be removed.  Wrap patch in a tissue and discard in the trash. Wash hands thoroughly with soap and water. 2. You may remove the patch earlier than 72 hours if you experience unpleasant side effects which may include dry mouth, dizziness or visual disturbances. 3. Avoid touching the patch. Wash your hands with soap and water after contact with the patch.

## 2019-06-21 NOTE — Anesthesia Procedure Notes (Signed)
Procedure Name: LMA Insertion Date/Time: 06/21/2019 10:17 AM Performed by: Imagene Riches, CRNA Pre-anesthesia Checklist: Patient identified, Emergency Drugs available, Suction available and Patient being monitored Patient Re-evaluated:Patient Re-evaluated prior to induction Oxygen Delivery Method: Circle System Utilized Preoxygenation: Pre-oxygenation with 100% oxygen Induction Type: IV induction Ventilation: Mask ventilation without difficulty LMA: LMA inserted LMA Size: 4.0 Number of attempts: 1 Airway Equipment and Method: Bite block Placement Confirmation: positive ETCO2 Tube secured with: Tape Dental Injury: Teeth and Oropharynx as per pre-operative assessment

## 2019-06-21 NOTE — Anesthesia Postprocedure Evaluation (Signed)
Anesthesia Post Note  Patient: [name]  Procedure(s) Performed: LEFT BREAST LUMPECTOMY WITH RADIOACTIVE SEED LOCALIZATION (Left Breast)     Patient location during evaluation: PACU Anesthesia Type: General Level of consciousness: awake and alert Pain management: pain level controlled Vital Signs Assessment: post-procedure vital signs reviewed and stable Respiratory status: spontaneous breathing, nonlabored ventilation, respiratory function stable and patient connected to nasal cannula oxygen Cardiovascular status: blood pressure returned to baseline and stable Postop Assessment: no apparent nausea or vomiting Anesthetic complications: no    Last Vitals:  Vitals:   06/21/19 1053 06/21/19 1100  BP: 130/81 124/79  Pulse: 62 60  Resp: 12 16  Temp: 36.6 C   SpO2: 100% 100%    Last Pain:  Vitals:   06/21/19 1100  TempSrc:   PainSc: 6                  Barnet Glasgow

## 2019-06-22 LAB — SURGICAL PATHOLOGY

## 2019-06-23 ENCOUNTER — Encounter: Payer: Self-pay | Admitting: *Deleted

## 2019-06-27 ENCOUNTER — Ambulatory Visit (INDEPENDENT_AMBULATORY_CARE_PROVIDER_SITE_OTHER): Payer: Medicare Other | Admitting: Pharmacist

## 2019-06-27 ENCOUNTER — Other Ambulatory Visit: Payer: Self-pay

## 2019-06-27 DIAGNOSIS — Z5181 Encounter for therapeutic drug level monitoring: Secondary | ICD-10-CM | POA: Diagnosis not present

## 2019-06-27 DIAGNOSIS — I480 Paroxysmal atrial fibrillation: Secondary | ICD-10-CM

## 2019-06-27 LAB — POCT INR: INR: 1.8 — AB (ref 2.0–3.0)

## 2019-07-08 ENCOUNTER — Ambulatory Visit (INDEPENDENT_AMBULATORY_CARE_PROVIDER_SITE_OTHER): Payer: Medicare Other | Admitting: Pharmacist Clinician (PhC)/ Clinical Pharmacy Specialist

## 2019-07-08 ENCOUNTER — Other Ambulatory Visit: Payer: Self-pay

## 2019-07-08 DIAGNOSIS — Z5181 Encounter for therapeutic drug level monitoring: Secondary | ICD-10-CM

## 2019-07-08 DIAGNOSIS — I480 Paroxysmal atrial fibrillation: Secondary | ICD-10-CM | POA: Diagnosis not present

## 2019-07-08 LAB — POCT INR: INR: 2 (ref 2.0–3.0)

## 2019-07-11 ENCOUNTER — Encounter (HOSPITAL_COMMUNITY): Payer: Self-pay | Admitting: Emergency Medicine

## 2019-07-11 ENCOUNTER — Emergency Department (HOSPITAL_COMMUNITY)
Admission: EM | Admit: 2019-07-11 | Discharge: 2019-07-11 | Disposition: A | Discharge status: Home / Self Care | Payer: Medicare Other | Attending: Emergency Medicine | Admitting: Emergency Medicine

## 2019-07-11 DIAGNOSIS — G8918 Other acute postprocedural pain: Secondary | ICD-10-CM | POA: Diagnosis present

## 2019-07-11 DIAGNOSIS — N6489 Other specified disorders of breast: Secondary | ICD-10-CM | POA: Diagnosis not present

## 2019-07-11 DIAGNOSIS — J449 Chronic obstructive pulmonary disease, unspecified: Secondary | ICD-10-CM | POA: Insufficient documentation

## 2019-07-11 DIAGNOSIS — Z91040 Latex allergy status: Secondary | ICD-10-CM | POA: Insufficient documentation

## 2019-07-11 DIAGNOSIS — I10 Essential (primary) hypertension: Secondary | ICD-10-CM | POA: Diagnosis not present

## 2019-07-11 DIAGNOSIS — Z85828 Personal history of other malignant neoplasm of skin: Secondary | ICD-10-CM | POA: Insufficient documentation

## 2019-07-11 DIAGNOSIS — E039 Hypothyroidism, unspecified: Secondary | ICD-10-CM | POA: Insufficient documentation

## 2019-07-11 DIAGNOSIS — L7634 Postprocedural seroma of skin and subcutaneous tissue following other procedure: Secondary | ICD-10-CM | POA: Insufficient documentation

## 2019-07-11 DIAGNOSIS — Z7901 Long term (current) use of anticoagulants: Secondary | ICD-10-CM | POA: Diagnosis not present

## 2019-07-11 NOTE — ED Provider Notes (Signed)
[facility]ERG[facility] DEPARTMENT Provider Note   CSN: [id number] Arrival date & time: 07/11/19  [id number]     History Chief Complaint  Patient presents with  . Post-op Problem    [name] is a 74 y.o. female.  HPI   74 y/o female -had a lumpectomy of the left breast approximately 3 weeks ago.  She is presenting to the hospital today with a complaint of slightly increased pain but most importantly drainage from her surgical site.  She reports that over the last couple of weeks she has had some swelling of the left side at the surgical site, today she noticed that she was having some dark brown material oozing from the surgical site.  No fevers, no spreading redness, no chills or nausea or vomiting.  Nothing seems to make this better or worse, dressing was placed on arrival.  She drove back from their vacation home in [place] to be evaluated today for this postoperative problem.  I have personally reviewed the medical record, the patient is on warfarin for paroxysmal atrial fibrillation.  She is not having any other bleeding  Past Medical History:  Diagnosis Date  . Acoustic neuroma (HCC)    Hx of right ear acoustic neuroma, removed in 1999. No hearing in right ear.  [name] Anxiety    Hx of, responds to Wellbutrin  . Basal cell carcinoma    s/p MOHS surgery in 03/2011  . Cataract   . Chronic anticoagulation 11/24/2016  . Chronic cholecystitis with calculus 05/13/2013  . Closed fracture of left lateral malleolus 08/10/2017  . COPD (chronic obstructive pulmonary disease) ([place])   . Deafness in right ear   . Embolic stroke ([place]) [id number]  . Family history of adverse reaction to anesthesia    Family hx of PONV  . Fracture    left lateral malleolus fracture  . GERD (gastroesophageal reflux disease)   . Headache(784.0)    MIGRAINES  . Hearing loss    Only in right ear, from an acoustic neuroma. S/P removal in 1999.   [name] History of hiatal hernia   . History  of neck pain    Responds to Flexeril  . History of shingles   . Hypercholesteremia   . Hypertension   . Hypothyroidism   . Melanoma ([place]) 2018   right arm-surgery only  . PAF (paroxysmal atrial fibrillation) ([place])    2 brief episodes of Afib recorded on monitor 02/2007  . Paroxysmal atrial fibrillation (HCC)   . PONV (postoperative nausea and vomiting)   . Presence of permanent cardiac pacemaker 2018   for A-fib  . Sick sinus syndrome ([place]) 12/19/2016  . Sinus bradycardia   . Tachycardia-bradycardia syndrome ([place]) 11/24/2016  . TIA (transient ischemic attack) 2011   On coumadin. Asymptomatic, without recurrence.    Patient Active Problem List   Diagnosis Date Noted  . Closed fracture of left lateral malleolus 08/10/2017  . Sick sinus syndrome ([place]) 12/19/2016  . Chronic anticoagulation 11/24/2016  . Tachycardia-bradycardia syndrome ([place]) 11/24/2016  . Embolic stroke ([place]) [id number]  . Hypersomnolence 07/05/2014  . Chronic cholecystitis with calculus 05/13/2013  . Encounter for therapeutic drug monitoring 05/09/2013  . Paroxysmal atrial fibrillation (HCC)   . Essential hypertension 12/05/2009  . HYPERCHOLESTEROLEMIA 10/23/2009  . ANXIETY STATE, UNSPECIFIED 10/23/2009  . MENOPAUSAL DISORDER 10/23/2009    Past Surgical History:  Procedure Laterality Date  . ABDOMINAL HYSTERECTOMY    . APPENDECTOMY    . BREAST BIOPSY  X 3  . BREAST CYST ASPIRATION Left 50 yrs ago per pt  . BREAST EXCISIONAL BIOPSY Left 50 yrs ago  . BREAST LUMPECTOMY WITH RADIOACTIVE SEED LOCALIZATION Left 06/21/2019   Procedure: LEFT BREAST LUMPECTOMY WITH RADIOACTIVE SEED LOCALIZATION;  Surgeon: Donnie Mesa, MD;  Location: [facility];  Service: General;  Laterality: Left;  . CHOLECYSTECTOMY N/A 05/26/2013   Procedure: LAPAROSCOPIC CHOLECYSTECTOMY WITH INTRAOPERATIVE CHOLANGIOGRAM;  Surgeon: Imogene Burn. Georgette Dover, MD;  Location: [facility];  Service: General;  Laterality: N/A;  . CRANIECTOMY FOR  EXCISION OF ACOUSTIC NEUROMA Right 1999  . INTRAOCULAR LENS INSERTION     Dr. Talbert [place]  . MOHS SURGERY  03/2011   Basal cell skin cancer  . ORIF ANKLE FRACTURE Left 08/10/2017   Procedure: OPEN REDUCTION INTERNAL FIXATION (ORIF) LEFT ANKLE FRACTURE;  Surgeon: Rod Can, MD;  Location: [facility];  Service: Orthopedics;  Laterality: Left;  . PACEMAKER IMPLANT N/A 12/19/2016   Medtronic Azure XT MRI conditional dual-chamber pacemaker for symptomatic sinus bradycardia by Dr Rayann Heman  . TONSILLECTOMY       OB History   No obstetric history on file.     Family History  Problem Relation Age of Onset  . Alcoholism Father   . Liver cancer Father   . Diabetes Mother   . High Cholesterol Mother   . Hypertension Mother   . Memory loss Mother   . Healthy Sister   . Breast cancer Maternal Aunt   . Brain cancer Maternal Aunt   . Cancer Maternal Grandmother        Unknown type  . Hypertension Other        paternal & maternal side with HTN, all lived into their 3s  . Heart disease Other        paternal & maternal side with heart disease, all lived into their 4s  . Hyperlipidemia Other        paternal & maternal side with hyperlipidemia, all lived into their 49s  . CVA Other        paternal & maternal side with strokes, all lived into their 82s    Social History   Tobacco Use  . Smoking status: Never Smoker  . Smokeless tobacco: Never Used  Substance Use Topics  . Alcohol use: Yes    Comment: Rare  . Drug use: No    Home Medications Prior to Admission medications   Medication Sig Start Date End Date Taking? Authorizing Provider  clonazePAM (KLONOPIN) 0.5 MG tablet Take 0.5 mg by mouth daily.  08/11/12  Yes [provider]  flecainide (TAMBOCOR) 100 MG tablet Take 2 tablets (200 mg total) by mouth daily as needed (Take one (1) hour after Toprol if still in AFIB.). 04/18/19  Yes Lynnell Jude, [employer] K, NP  FLUoxetine (PROZAC) 20 MG capsule Take 40 mg by mouth daily with breakfast. Take  with 10 mg to equal 30 mg daily.   Yes [provider]  fluticasone (FLONASE) 50 MCG/ACT nasal spray Place 1 spray into both nostrils daily. 07/06/19  Yes [provider]  levothyroxine (SYNTHROID, LEVOTHROID) 88 MCG tablet Take 88 mcg by mouth daily before breakfast.   Yes [provider]  loratadine (CLARITIN) 10 MG tablet Take 10 mg by mouth daily as needed for allergies.   Yes [provider]  metoprolol succinate (TOPROL-XL) 25 MG 24 hr tablet Take 0.5 tablets (12.5 mg total) by mouth daily as needed (For recurrent AFIB). 04/18/19  Yes Patsey Berthold, NP  pantoprazole (PROTONIX) 40 MG tablet Take 40 mg by mouth daily as needed (acid reflux).    Yes [provider]  warfarin (COUMADIN) 2.5 MG tablet TAKE 1-2 TABLETS BY MOUTH DAILY AS DIRECTED BY COUMADIN CLINIC. Patient taking differently: Take 4 mg by mouth at bedtime.  06/21/19  Yes Belva Crome, MD  enoxaparin (LOVENOX) 60 MG/0.6ML injection Inject 1 syringe (60 mg) every 12 hours as directed by coumadin clinic Patient not taking: Reported on 07/11/2019 05/30/19   Belva Crome, MD  HYDROcodone-acetaminophen (NORCO/VICODIN) 5-325 MG tablet Take 1 tablet by mouth every 6 (six) hours as needed. Patient not taking: Reported on 07/11/2019 06/21/19   Donnie Mesa, MD    Allergies    Atorvastatin, Statins, Ezetimibe, Codeine, Latex, and Penicillins  Review of Systems   Review of Systems  Constitutional: Negative for chills and fever.  Skin: Positive for wound. Negative for color change and rash.  Hematological: Bruises/bleeds easily.    Physical Exam Updated Vital Signs BP (!) 136/93 (BP Location: Right Arm)   Pulse 84   Temp 98 F (36.7 C) (Oral)   Resp 20   Ht 1.651 m (5\' 5" )   Wt 68 kg   SpO2 100%   BMI 24.96 kg/m   Physical Exam Vitals and nursing note reviewed.  Constitutional:      Appearance: She is well-developed. She is not diaphoretic.  HENT:     Head: Normocephalic and  atraumatic.  Eyes:     General:        Right eye: No discharge.        Left eye: No discharge.     Conjunctiva/sclera: Conjunctivae normal.  Cardiovascular:     Rate and Rhythm: Normal rate and regular rhythm.     Comments: No edema, normal pulses at the radial arteries bilaterally Pulmonary:     Effort: Pulmonary effort is normal. No respiratory distress.     Comments: Breast exam on the left shows a swollen left breast, small amount of dark brown drainage from the surgical site, no redness or warmth to the skin, no induration, no peau d'orange Skin:    General: Skin is warm and dry.     Findings: No erythema or rash.  Neurological:     Mental Status: She is alert.     Coordination: Coordination normal.     ED Results / Procedures / Treatments   Labs (all labs ordered are listed, but only abnormal results are displayed) Labs Reviewed - No data to display  EKG None  Radiology No results found.  Procedures Procedures (including critical care time)  Medications Ordered in ED Medications - No data to display  ED Course  I have reviewed the triage vital signs and the nursing notes.  Pertinent labs & imaging results that were available during my care of the patient were reviewed by me and considered in my medical decision making (see chart for details).    MDM Rules/Calculators/A&P                      The patient is having some postoperative bleeding, this is likely just an old seroma from the surgery, she is not having any bright red blood, she is hemodynamically very stable with a blood pressure of 136/93 and a pulse of 84.  Will discuss with general surgery regarding what they would like to do for follow-up as well as for acute management of this postoperative hematoma / seroma  D/w Claiborne Billings  with general surgery - they will come to see the patient.  General surgery has seen the patient, they agree that the patient can be discharged home to follow-up in the outpatient  setting, not infected, draining seroma, stable for discharge, the patient was informed to wear a sports bra and keep a dressing on this  Final Clinical Impression(s) / ED Diagnoses Final diagnoses:  Seroma of breast    Rx / DC Orders ED Discharge Orders    None       Noemi Chapel, MD 07/11/19 1059

## 2019-07-11 NOTE — Discharge Instructions (Signed)
Please follow-up with your general surgeon on Monday, if they want to see you sooner they will give you a call.  Your general surgeon will be made aware that you were here today.  The surgical team feels like this is a hematoma, this is somewhat normal after surgery and should continue to drain and go down over time.  Seek medical exam for increasing fevers redness pain nausea or vomiting.  I would expect this to continue to drain, do not be concerned unless it becomes bright red blood with heavy amounts of bleeding

## 2019-07-11 NOTE — Progress Notes (Signed)
Patient ID: [name], female   DOB: 03-Dec-1945, 74 y.o.   MRN: [id number]       Subjective: Patient is known to Dr. Georgette Dover for recent excisional breast biopsy/lumpectomy for a sclerosing lesion she was found to have.  She is on coumadin.  She states she developed a hematoma post op but never called to let anyone know.  This is over half the size of her breast and her breast has been hard since that time.  She has not had any fevers, chills, erythema around her incision, etc.  Today she woke up and noticed spontaneous dark brown appearing drainage from her left breast at her incision.  She called EMS at [facility] who bandaged it for her and then she drove to [facility] for further evaluation.  She is supposed to follow up with Dr. Georgette Dover next Monday.  ROS: See above, otherwise other systems negative  Objective: Vital signs in last 24 hours: Temp:  [98 F (36.7 C)] 98 F (36.7 C) (04/05 0932) Pulse Rate:  [84] 84 (04/05 0932) Resp:  [20] 20 (04/05 0932) BP: (136)/(93) 136/93 (04/05 0932) SpO2:  [100 %] 100 % (04/05 0932) Weight:  [68 kg] 68 kg (04/05 0936)    Intake/Output from previous day: No intake/output data recorded. Intake/Output this shift: No intake/output data recorded.  PE: Gen: NAD Heart: regular Lungs: CTAB Chest: left breast with moderate sized hematoma most of the middle to lower breast.  This is bruised but is a healing bruise.  She does have what appears to be old dark brown liquified hematoma draining from a small 1-33mm opening on the lateral aspect of her incision.  This area is not erythematous or tender.  The rest of the incision is well healed and intact.  No nipple drainage.   Lab Results:  No results for input(s): WBC, HGB, HCT, PLT in the last 72 hours. BMET No results for input(s): NA, K, CL, CO2, GLUCOSE, BUN, CREATININE, CALCIUM in the last 72 hours. PT/INR Recent Labs    07/08/19 1331  INR 2.0   CMP     Component Value Date/Time   NA  140 07/30/2017 1343   NA 143 12/12/2016 1500   K 5.2 (H) 07/30/2017 1343   CL 106 07/30/2017 1343   CO2 24 07/30/2017 1343   GLUCOSE 108 (H) 07/30/2017 1343   BUN 15 07/30/2017 1343   BUN 11 12/12/2016 1500   CREATININE 0.94 08/10/2017 1541   CALCIUM 9.6 07/30/2017 1343   PROT 7.0 11/21/2016 1722   ALBUMIN 4.2 11/21/2016 1722   AST 25 11/21/2016 1722   ALT 15 11/21/2016 1722   ALKPHOS 63 11/21/2016 1722   BILITOT 0.3 11/21/2016 1722   GFRNONAA 59 (L) 08/10/2017 1541   GFRAA >60 08/10/2017 1541   Lipase     Component Value Date/Time   LIPASE 19 05/03/2013 1302       Studies/Results: No results found.  Anti-infectives: Anti-infectives (From admission, onward)   None       Assessment/Plan 3 weeks s/p left breast excisional biopsy with hematoma -will continue to let her breast drain as it is already doing.  Would not open this up to evacuate it at this time.  She understands to keep this covered and that it may drain for a while as this is a large hematoma. -pathology was negative for malignancy -she will follow up with Dr. Georgette Dover in our office for further evaluation and monitoring. -I have told her to wear her  breast binder or a sports bra for compression and support. -discussed with EDP, ok for DC home.     LOS: 0 days    Henreitta Cea , [facility] 07/11/2019, 12:09 PM Please see Amion for pager number during day hours 7:00am-4:30pm or 7:00am -11:30am on weekends

## 2019-07-11 NOTE — ED Notes (Signed)
Surgical PA at bedside  

## 2019-07-11 NOTE — ED Notes (Signed)
Patient states her left breast has been bruised and hard since her lumpectomy. Since bleeding this am, states the firmness in her breast is better. Denies pain. Left breast is still oozing from surgical site.

## 2019-07-11 NOTE — ED Triage Notes (Signed)
Pt. Stated, I had an lumpectomy 3 weeks ago and this morning something popped and starting stuff coming out all blood . It was hard and now its all soft, Called EMS and they wrapped it and told to come here. Also called the Dr. And he said to call him

## 2019-07-18 ENCOUNTER — Other Ambulatory Visit: Payer: Self-pay

## 2019-07-18 ENCOUNTER — Ambulatory Visit (INDEPENDENT_AMBULATORY_CARE_PROVIDER_SITE_OTHER): Payer: Medicare Other | Admitting: Pharmacist

## 2019-07-18 DIAGNOSIS — Z5181 Encounter for therapeutic drug level monitoring: Secondary | ICD-10-CM

## 2019-07-18 DIAGNOSIS — I480 Paroxysmal atrial fibrillation: Secondary | ICD-10-CM | POA: Diagnosis not present

## 2019-07-18 LAB — POCT INR: INR: 1.9 — AB (ref 2.0–3.0)

## 2019-07-25 ENCOUNTER — Ambulatory Visit (INDEPENDENT_AMBULATORY_CARE_PROVIDER_SITE_OTHER): Payer: Medicare Other | Admitting: *Deleted

## 2019-07-25 DIAGNOSIS — I495 Sick sinus syndrome: Secondary | ICD-10-CM

## 2019-07-25 LAB — CUP PACEART REMOTE DEVICE CHECK
Battery Remaining Longevity: 132 mo
Battery Voltage: 3.01 V
Brady Statistic AP VP Percent: 1.09 %
Brady Statistic AP VS Percent: 94.77 %
Brady Statistic AS VP Percent: 0 %
Brady Statistic AS VS Percent: 4.13 %
Brady Statistic RA Percent Paced: 96.56 %
Brady Statistic RV Percent Paced: 1.1 %
Date Time Interrogation Session: 20210419011649
Implantable Lead Implant Date: 20180914
Implantable Lead Implant Date: 20180914
Implantable Lead Location: 753859
Implantable Lead Location: 753860
Implantable Lead Model: 5076
Implantable Lead Model: 5076
Implantable Pulse Generator Implant Date: 20180914
Lead Channel Impedance Value: 323 Ohm
Lead Channel Impedance Value: 418 Ohm
Lead Channel Impedance Value: 418 Ohm
Lead Channel Impedance Value: 513 Ohm
Lead Channel Pacing Threshold Amplitude: 0.625 V
Lead Channel Pacing Threshold Amplitude: 0.75 V
Lead Channel Pacing Threshold Pulse Width: 0.4 ms
Lead Channel Pacing Threshold Pulse Width: 0.4 ms
Lead Channel Sensing Intrinsic Amplitude: 14.875 mV
Lead Channel Sensing Intrinsic Amplitude: 14.875 mV
Lead Channel Sensing Intrinsic Amplitude: 3.125 mV
Lead Channel Sensing Intrinsic Amplitude: 3.125 mV
Lead Channel Setting Pacing Amplitude: 1.5 V
Lead Channel Setting Pacing Amplitude: 2.5 V
Lead Channel Setting Pacing Pulse Width: 0.4 ms
Lead Channel Setting Sensing Sensitivity: 0.9 mV

## 2019-07-26 NOTE — Progress Notes (Signed)
PPM Remote  

## 2019-08-01 ENCOUNTER — Ambulatory Visit (INDEPENDENT_AMBULATORY_CARE_PROVIDER_SITE_OTHER): Payer: Medicare Other | Admitting: Pharmacist Clinician (PhC)/ Clinical Pharmacy Specialist

## 2019-08-01 ENCOUNTER — Other Ambulatory Visit: Payer: Self-pay

## 2019-08-01 DIAGNOSIS — Z5181 Encounter for therapeutic drug level monitoring: Secondary | ICD-10-CM

## 2019-08-01 DIAGNOSIS — I480 Paroxysmal atrial fibrillation: Secondary | ICD-10-CM | POA: Diagnosis not present

## 2019-08-01 DIAGNOSIS — I63419 Cerebral infarction due to embolism of unspecified middle cerebral artery: Secondary | ICD-10-CM | POA: Diagnosis not present

## 2019-08-01 LAB — POCT INR: INR: 2.5 (ref 2.0–3.0)

## 2019-08-25 ENCOUNTER — Ambulatory Visit (INDEPENDENT_AMBULATORY_CARE_PROVIDER_SITE_OTHER): Payer: Medicare Other | Admitting: Pharmacist Clinician (PhC)/ Clinical Pharmacy Specialist

## 2019-08-25 ENCOUNTER — Other Ambulatory Visit: Payer: Self-pay

## 2019-08-25 DIAGNOSIS — I63419 Cerebral infarction due to embolism of unspecified middle cerebral artery: Secondary | ICD-10-CM | POA: Diagnosis not present

## 2019-08-25 DIAGNOSIS — Z7901 Long term (current) use of anticoagulants: Secondary | ICD-10-CM | POA: Diagnosis not present

## 2019-08-25 DIAGNOSIS — L821 Other seborrheic keratosis: Secondary | ICD-10-CM | POA: Diagnosis not present

## 2019-08-25 DIAGNOSIS — Z5181 Encounter for therapeutic drug level monitoring: Secondary | ICD-10-CM

## 2019-08-25 DIAGNOSIS — L249 Irritant contact dermatitis, unspecified cause: Secondary | ICD-10-CM | POA: Diagnosis not present

## 2019-08-25 DIAGNOSIS — D225 Melanocytic nevi of trunk: Secondary | ICD-10-CM | POA: Diagnosis not present

## 2019-08-25 DIAGNOSIS — Z85820 Personal history of malignant melanoma of skin: Secondary | ICD-10-CM | POA: Diagnosis not present

## 2019-08-25 DIAGNOSIS — L905 Scar conditions and fibrosis of skin: Secondary | ICD-10-CM | POA: Diagnosis not present

## 2019-08-25 DIAGNOSIS — B351 Tinea unguium: Secondary | ICD-10-CM | POA: Diagnosis not present

## 2019-08-25 DIAGNOSIS — L570 Actinic keratosis: Secondary | ICD-10-CM | POA: Diagnosis not present

## 2019-08-25 DIAGNOSIS — I480 Paroxysmal atrial fibrillation: Secondary | ICD-10-CM

## 2019-08-25 DIAGNOSIS — L814 Other melanin hyperpigmentation: Secondary | ICD-10-CM | POA: Diagnosis not present

## 2019-08-25 LAB — POCT INR: INR: 2.3 (ref 2.0–3.0)

## 2019-09-12 ENCOUNTER — Other Ambulatory Visit: Payer: Self-pay

## 2019-09-12 MED ORDER — WARFARIN SODIUM 2.5 MG PO TABS: ORAL_TABLET | ORAL | 5 refills | Status: DC

## 2019-09-30 ENCOUNTER — Other Ambulatory Visit: Payer: Self-pay | Admitting: Student

## 2019-10-03 ENCOUNTER — Other Ambulatory Visit: Payer: Self-pay | Admitting: Student

## 2019-10-03 DIAGNOSIS — N63 Unspecified lump in unspecified breast: Secondary | ICD-10-CM

## 2019-10-05 ENCOUNTER — Other Ambulatory Visit: Payer: Medicare Other

## 2019-10-06 ENCOUNTER — Ambulatory Visit (INDEPENDENT_AMBULATORY_CARE_PROVIDER_SITE_OTHER): Payer: Medicare Other | Admitting: Pharmacist Clinician (PhC)/ Clinical Pharmacy Specialist

## 2019-10-06 ENCOUNTER — Other Ambulatory Visit: Payer: Self-pay

## 2019-10-06 DIAGNOSIS — Z5181 Encounter for therapeutic drug level monitoring: Secondary | ICD-10-CM

## 2019-10-06 DIAGNOSIS — I480 Paroxysmal atrial fibrillation: Secondary | ICD-10-CM

## 2019-10-06 DIAGNOSIS — E7800 Pure hypercholesterolemia, unspecified: Secondary | ICD-10-CM | POA: Diagnosis not present

## 2019-10-06 LAB — POCT INR: INR: 2 (ref 2.0–3.0)

## 2019-10-06 MED ORDER — ROSUVASTATIN CALCIUM 5 MG PO TABS: ORAL_TABLET | ORAL | 3 refills | Status: DC

## 2019-10-06 NOTE — Patient Instructions (Signed)
Your Results:             Your most recent labs Goal  Total Cholesterol 377 < 200  Triglycerides 323 < 150  HDL (happy/good cholesterol) 43 > 40  LDL (lousy/bad cholesterol 296 < 100   Medication changes:  Start rosuvastatin 5 mg once weekly.    Lab orders:  Go to the lab sometime in the next month for fasting labs  Patient Assistance:  The [employer] foundation offers assistance to help pay for medication copays.  They will cover copays for all cholesterol lowering meds, including statins, fibrates, omega-3 oils, ezetimibe, Repatha, Praluent, Nexletol, Nexlizet.  The cards are usually good for $2,500 or 12 months, whichever comes first. 1. Go to healthwellfoundation.org 2. Click on "Apply Now" 3. Answer questions as to whom is applying (patient or representative) 4. Your disease fund will be "hypercholesterolemia - Medicare access" 5. They will ask questions about finances and which medications you are taking for cholesterol 6. When you submit, the approval is usually within minutes.  You will need to print the card information from the site 7. You will need to show this information to your pharmacy, they will bill your Medicare Part D plan first -then bill [employer] --for the copay.   You can also call them at [phone], although the hold times can be quite long.   Thank you for choosing [facility]

## 2019-10-06 NOTE — Assessment & Plan Note (Signed)
Patient with familial hyperlipidemia, most recent LDL at 269 (November).  She is going to go to the lab sometime in the next few weeks to get an updated lab for the prior authorization process.  Reviewed options for lowering LDL cholesterol, including PCSK-9 inhibitors and bempedoic acid.  Discussed mechanisms of action, dosing, side effects and potential decreases in LDL cholesterol.  Answered all patient questions.  Based on this information, patient would prefer to start Repatha 668 mg Sureclick.  We also discussed the benefit of adding statin to PCKS-9, even if it's just rosuvastatin 5 mg weekly.  She is willing to try this as well. Once the updated labs are in we will get Repatha approval.  Repeat labs 3 months after starting.

## 2019-10-06 NOTE — Progress Notes (Addendum)
10/06/2019 [name] [birth year] [id number]   HPI:  [name] is a 74 y.o. female patient of Dr Tamala Julian, who presents today for a lipid clinic evaluation.  In addition to hyperlipidemia her medical history is significant for paroxysmal atrial fibrillation.  She is anticoagulated on warfarin.  Her most recent labs are from November of 2020 and show triglycerides of 323 and LDL of 269.  She notes that she may have had a TIA many years ago, but by the time she got to the hospital she was feeling better, and scans did not pick up any sign of stroke.    Current Medications: none  Cholesterol Goals: LDL < 100   Intolerant/previously tried:  crestor, lipitor, ezetimibe - all caused myalgias  Family history:  Mother with first stroke in her 54's died at 34, mgm first stroke in her 36's; 3 children, 2 with known elevated cholesterol (one has started vegan diet and cholesterol much improved)  Labs: 02/2019 TC 377, TG 323, HDL 43, LDL 269   Current Outpatient Medications  Medication Sig Dispense Refill  . clonazePAM (KLONOPIN) 0.5 MG tablet Take 0.5 mg by mouth daily.     [name] FLUoxetine (PROZAC) 20 MG capsule Take 40 mg by mouth daily with breakfast. Take with 10 mg to equal 30 mg daily.    . fluticasone (FLONASE) 50 MCG/ACT nasal spray Place 1 spray into both nostrils daily.    [name] levothyroxine (SYNTHROID, LEVOTHROID) 88 MCG tablet Take 88 mcg by mouth daily before breakfast.    . loratadine (CLARITIN) 10 MG tablet Take 10 mg by mouth daily as needed for allergies.    . pantoprazole (PROTONIX) 40 MG tablet Take 40 mg by mouth daily as needed (acid reflux).     . warfarin (COUMADIN) 2.5 MG tablet TAKE 1-2 TABLETS BY MOUTH DAILY AS DIRECTED BY COUMADIN CLINIC. 120 tablet 5  . rosuvastatin (CRESTOR) 5 MG tablet Take 1 tablet by mouth as directed 1-3 times per week 12 tablet 3   No current facility-administered medications for this visit.    Allergies  Allergen Reactions  .  Atorvastatin Other (See Comments)    MUSCLE WEAKNESS  . Statins Other (See Comments)    MUSCLE WEAKNESS [CLASS EFFECT]  . Ezetimibe     Muscle pain, trouble going to the bathroom.   . Codeine Nausea Only  . Latex Rash  . Penicillins Rash and Other (See Comments)    Has patient had a PCN reaction causing immediate rash, facial/tongue/throat swelling, SOB or lightheadedness with hypotension: Rash in hands  Has patient had a PCN reaction causing severe rash involving mucus membranes or skin necrosis: no Has patient had a PCN reaction that required hospitalization No Has patient had a PCN reaction occurring within the last 10 years: No  If all of the above answers are "NO", then may proceed with Cephalosporin use.     Past Medical History:  Diagnosis Date  . Acoustic neuroma (HCC)    Hx of right ear acoustic neuroma, removed in 1999. No hearing in right ear.  [name] Anxiety    Hx of, responds to Wellbutrin  . Basal cell carcinoma    s/p MOHS surgery in 03/2011  . Cataract   . Chronic anticoagulation 11/24/2016  . Chronic cholecystitis with calculus 05/13/2013  . Closed fracture of left lateral malleolus 08/10/2017  . COPD (chronic obstructive pulmonary disease) ([place])   . Deafness in right ear   . Embolic stroke ([place]) 7/56/4332  . Family  history of adverse reaction to anesthesia    Family hx of PONV  . Fracture    left lateral malleolus fracture  . GERD (gastroesophageal reflux disease)   . Headache(784.0)    MIGRAINES  . Hearing loss    Only in right ear, from an acoustic neuroma. S/P removal in 1999.   [name] History of hiatal hernia   . History of neck pain    Responds to Flexeril  . History of shingles   . Hypercholesteremia   . Hypertension   . Hypothyroidism   . Melanoma ([place]) 2018   right arm-surgery only  . PAF (paroxysmal atrial fibrillation) ([place])    2 brief episodes of Afib recorded on monitor 02/2007  . Paroxysmal atrial fibrillation (HCC)   . PONV (postoperative nausea  and vomiting)   . Presence of permanent cardiac pacemaker 2018   for A-fib  . Sick sinus syndrome ([place]) 12/19/2016  . Sinus bradycardia   . Tachycardia-bradycardia syndrome ([place]) 11/24/2016  . TIA (transient ischemic attack) 2011   On coumadin. Asymptomatic, without recurrence.    Blood pressure 104/70, pulse 84, resp. rate 14, height 5\' 5"  (1.651 m), weight 150 lb (68 kg), SpO2 95 %.   HYPERCHOLESTEROLEMIA Patient with familial hyperlipidemia, most recent LDL at 269 (November).  She is going to go to the lab sometime in the next few weeks to get an updated lab for the prior authorization process.  Reviewed options for lowering LDL cholesterol, including PCSK-9 inhibitors and bempedoic acid.  Discussed mechanisms of action, dosing, side effects and potential decreases in LDL cholesterol.  Answered all patient questions.  Based on this information, patient would prefer to start Repatha 416 mg Sureclick.  We also discussed the benefit of adding statin to PCKS-9, even if it's just rosuvastatin 5 mg weekly.  She is willing to try this as well. Once the updated labs are in we will get Repatha approval.  Repeat labs 3 months after starting.   This visit was conducted face to face in the office.    Tommy Medal PharmD CPP [facility] [address] [place], [place] [address] [phone]

## 2019-10-24 ENCOUNTER — Ambulatory Visit (INDEPENDENT_AMBULATORY_CARE_PROVIDER_SITE_OTHER): Payer: Medicare Other | Admitting: *Deleted

## 2019-10-24 DIAGNOSIS — I495 Sick sinus syndrome: Secondary | ICD-10-CM

## 2019-10-24 LAB — CUP PACEART REMOTE DEVICE CHECK
Battery Remaining Longevity: 128 mo
Battery Voltage: 3.01 V
Brady Statistic AP VP Percent: 1.1 %
Brady Statistic AP VS Percent: 94.85 %
Brady Statistic AS VP Percent: 0 %
Brady Statistic AS VS Percent: 4.05 %
Brady Statistic RA Percent Paced: 96.63 %
Brady Statistic RV Percent Paced: 1.1 %
Date Time Interrogation Session: 20210718222455
Implantable Lead Implant Date: 20180914
Implantable Lead Implant Date: 20180914
Implantable Lead Location: 753859
Implantable Lead Location: 753860
Implantable Lead Model: 5076
Implantable Lead Model: 5076
Implantable Pulse Generator Implant Date: 20180914
Lead Channel Impedance Value: 323 Ohm
Lead Channel Impedance Value: 399 Ohm
Lead Channel Impedance Value: 418 Ohm
Lead Channel Impedance Value: 494 Ohm
Lead Channel Pacing Threshold Amplitude: 0.625 V
Lead Channel Pacing Threshold Amplitude: 0.875 V
Lead Channel Pacing Threshold Pulse Width: 0.4 ms
Lead Channel Pacing Threshold Pulse Width: 0.4 ms
Lead Channel Sensing Intrinsic Amplitude: 12.5 mV
Lead Channel Sensing Intrinsic Amplitude: 12.5 mV
Lead Channel Sensing Intrinsic Amplitude: 3.25 mV
Lead Channel Sensing Intrinsic Amplitude: 3.25 mV
Lead Channel Setting Pacing Amplitude: 1.5 V
Lead Channel Setting Pacing Amplitude: 2.5 V
Lead Channel Setting Pacing Pulse Width: 0.4 ms
Lead Channel Setting Sensing Sensitivity: 0.9 mV

## 2019-10-26 NOTE — Progress Notes (Signed)
Remote pacemaker transmission.   

## 2019-10-31 DIAGNOSIS — L7622 Postprocedural hemorrhage and hematoma of skin and subcutaneous tissue following other procedure: Secondary | ICD-10-CM | POA: Diagnosis not present

## 2019-10-31 DIAGNOSIS — D242 Benign neoplasm of left breast: Secondary | ICD-10-CM | POA: Diagnosis not present

## 2019-11-03 ENCOUNTER — Other Ambulatory Visit: Payer: Self-pay

## 2019-11-03 DIAGNOSIS — I63419 Cerebral infarction due to embolism of unspecified middle cerebral artery: Secondary | ICD-10-CM

## 2019-11-03 LAB — LIPID PANEL
Chol/HDL Ratio: 6.2 ratio — ABNORMAL HIGH (ref 0.0–4.4)
Cholesterol, Total: 316 mg/dL — ABNORMAL HIGH (ref 100–199)
HDL: 51 mg/dL (ref >39)
LDL Chol Calc (NIH): 233 mg/dL — ABNORMAL HIGH (ref 0–99)
Triglycerides: 165 mg/dL — ABNORMAL HIGH (ref 0–149)
VLDL Cholesterol Cal: 32 mg/dL (ref 5–40)

## 2019-11-03 LAB — HEPATIC FUNCTION PANEL
ALT: 13 IU/L (ref 0–32)
AST: 18 IU/L (ref 0–40)
Albumin: 4.4 g/dL (ref 3.7–4.7)
Alkaline Phosphatase: 65 IU/L (ref 48–121)
Bilirubin Total: 0.3 mg/dL (ref 0.0–1.2)
Bilirubin, Direct: 0.06 mg/dL (ref 0.00–0.40)
Total Protein: 6.3 g/dL (ref 6.0–8.5)

## 2019-11-07 ENCOUNTER — Other Ambulatory Visit: Payer: Self-pay

## 2019-11-07 ENCOUNTER — Ambulatory Visit (INDEPENDENT_AMBULATORY_CARE_PROVIDER_SITE_OTHER): Payer: Medicare Other

## 2019-11-07 DIAGNOSIS — I480 Paroxysmal atrial fibrillation: Secondary | ICD-10-CM

## 2019-11-07 DIAGNOSIS — Z5181 Encounter for therapeutic drug level monitoring: Secondary | ICD-10-CM

## 2019-11-07 LAB — POCT INR: INR: 2.4 (ref 2.0–3.0)

## 2019-11-07 NOTE — Patient Instructions (Signed)
Continue with 2 tablets every Monday and 1 and 1/2 tablet all other days of the week.  Next INR in 6 weeks.

## 2019-11-09 ENCOUNTER — Telehealth: Payer: Self-pay | Admitting: Pharmacist

## 2019-11-09 MED ORDER — EVOLOCUMAB 140 MG/ML ~~LOC~~ SOAJ: 1.0000 mL | SUBCUTANEOUS | 3 refills | Status: DC

## 2019-11-09 NOTE — Telephone Encounter (Signed)
Repatha PA approved through 05/10/20. Called patient and let her know. Rx sent to pharmacy

## 2019-11-14 NOTE — Telephone Encounter (Signed)
This encounter was created in error - please disregard.

## 2019-12-05 DIAGNOSIS — H02055 Trichiasis without entropian left lower eyelid: Secondary | ICD-10-CM | POA: Diagnosis not present

## 2019-12-05 DIAGNOSIS — H02054 Trichiasis without entropian left upper eyelid: Secondary | ICD-10-CM | POA: Diagnosis not present

## 2019-12-19 ENCOUNTER — Other Ambulatory Visit: Payer: Self-pay

## 2019-12-19 ENCOUNTER — Ambulatory Visit (INDEPENDENT_AMBULATORY_CARE_PROVIDER_SITE_OTHER): Payer: Medicare Other

## 2019-12-19 DIAGNOSIS — I480 Paroxysmal atrial fibrillation: Secondary | ICD-10-CM

## 2019-12-19 DIAGNOSIS — Z5181 Encounter for therapeutic drug level monitoring: Secondary | ICD-10-CM | POA: Diagnosis not present

## 2019-12-19 LAB — POCT INR: INR: 3 (ref 2.0–3.0)

## 2019-12-19 NOTE — Patient Instructions (Signed)
Continue with 2 tablets every Monday and 1.5 tablets all other days of the week.  Next INR in 6 weeks.

## 2019-12-27 DIAGNOSIS — H02055 Trichiasis without entropian left lower eyelid: Secondary | ICD-10-CM | POA: Diagnosis not present

## 2019-12-27 DIAGNOSIS — H02054 Trichiasis without entropian left upper eyelid: Secondary | ICD-10-CM | POA: Diagnosis not present

## 2020-01-08 NOTE — Progress Notes (Signed)
Cardiology Office Note:    Date:  01/09/2020   ID:  [name], DOB 09-Mar-1946, MRN [id number]  PCP:  Aura Dials, MD  Cardiologist:  Sinclair Grooms, MD   Referring MD: Aura Dials, MD   Chief Complaint  Patient presents with  . Atrial Fibrillation    History of Present Illness:    [name] is a 74 y.o. female with a hx of paroxysmal atrial fibrillation, embolic CVA which led to the diagnosis of paroxysmal atrial fibrillation, chronic Coumadin anticoagulation, subsequent development of tachycardia bradycardia syndrome, ultimately treated with DDD pacemaker 12/20/2016.  Demonstrated today that she is having paroxysmal atrial fibrillation.  Was in A. fib when EKG was done but at the time of auscultation, rhythm was perfectly regular consistent with sinus rhythm.  She does note a lot of stress in her life: Husband had prostate cancer and has been having complications.  Son diagnosed with diabetes.  This is caused her to have concerned about their health and wellbeing.  During this time she has had instances where she felt irregularity and racing in her chest.  When this occurs she takes 1/2 tablet of metoprolol.  We do not have that on her medication list.  She will call us with the information.  Past Medical History:  Diagnosis Date  . Acoustic neuroma (HCC)    Hx of right ear acoustic neuroma, removed in 1999. No hearing in right ear.  [name] Anxiety    Hx of, responds to Wellbutrin  . Basal cell carcinoma    s/p MOHS surgery in 03/2011  . Cataract   . Chronic anticoagulation 11/24/2016  . Chronic cholecystitis with calculus 05/13/2013  . Closed fracture of left lateral malleolus 08/10/2017  . COPD (chronic obstructive pulmonary disease) ([place])   . Deafness in right ear   . Embolic stroke ([place]) 7/61/6073  . Family history of adverse reaction to anesthesia    Family hx of PONV  . Fracture    left lateral malleolus fracture  . GERD (gastroesophageal  reflux disease)   . Headache(784.0)    MIGRAINES  . Hearing loss    Only in right ear, from an acoustic neuroma. S/P removal in 1999.   [name] History of hiatal hernia   . History of neck pain    Responds to Flexeril  . History of shingles   . Hypercholesteremia   . Hypertension   . Hypothyroidism   . Melanoma ([place]) 2018   right arm-surgery only  . PAF (paroxysmal atrial fibrillation) ([place])    2 brief episodes of Afib recorded on monitor 02/2007  . Paroxysmal atrial fibrillation (HCC)   . PONV (postoperative nausea and vomiting)   . Presence of permanent cardiac pacemaker 2018   for A-fib  . Sick sinus syndrome ([place]) 12/19/2016  . Sinus bradycardia   . Tachycardia-bradycardia syndrome ([place]) 11/24/2016  . TIA (transient ischemic attack) 2011   On coumadin. Asymptomatic, without recurrence.    Past Surgical History:  Procedure Laterality Date  . ABDOMINAL HYSTERECTOMY    . APPENDECTOMY    . BREAST BIOPSY     X 3  . BREAST CYST ASPIRATION Left 50 yrs ago per pt  . BREAST EXCISIONAL BIOPSY Left 50 yrs ago  . BREAST LUMPECTOMY WITH RADIOACTIVE SEED LOCALIZATION Left 06/21/2019   Procedure: LEFT BREAST LUMPECTOMY WITH RADIOACTIVE SEED LOCALIZATION;  Surgeon: Donnie Mesa, MD;  Location: [facility];  Service: General;  Laterality: Left;  . CHOLECYSTECTOMY N/A 05/26/2013  Procedure: LAPAROSCOPIC CHOLECYSTECTOMY WITH INTRAOPERATIVE CHOLANGIOGRAM;  Surgeon: Imogene Burn. Georgette Dover, MD;  Location: [facility];  Service: General;  Laterality: N/A;  . CRANIECTOMY FOR EXCISION OF ACOUSTIC NEUROMA Right 1999  . INTRAOCULAR LENS INSERTION     Dr. Talbert Forest  . MOHS SURGERY  03/2011   Basal cell skin cancer  . ORIF ANKLE FRACTURE Left 08/10/2017   Procedure: OPEN REDUCTION INTERNAL FIXATION (ORIF) LEFT ANKLE FRACTURE;  Surgeon: Rod Can, MD;  Location: [facility];  Service: Orthopedics;  Laterality: Left;  . PACEMAKER IMPLANT N/A 12/19/2016   Medtronic Azure XT MRI conditional dual-chamber  pacemaker for symptomatic sinus bradycardia by Dr Rayann Heman  . TONSILLECTOMY      Current Medications: Current Meds  Medication Sig  . clonazePAM (KLONOPIN) 0.5 MG tablet Take 0.5 mg by mouth daily.   . Evolocumab 140 MG/ML SOAJ Inject 1 mL into the skin every 14 (fourteen) days.  [name] FLUoxetine (PROZAC) 20 MG capsule Take 40 mg by mouth daily with breakfast. Take with 10 mg to equal 30 mg daily.  . fluticasone (FLONASE) 50 MCG/ACT nasal spray Place 1 spray into both nostrils daily.  [name] levothyroxine (SYNTHROID, LEVOTHROID) 88 MCG tablet Take 88 mcg by mouth daily before breakfast.  . loratadine (CLARITIN) 10 MG tablet Take 10 mg by mouth daily as needed for allergies.  . pantoprazole (PROTONIX) 40 MG tablet Take 40 mg by mouth daily as needed (acid reflux).   . rosuvastatin (CRESTOR) 5 MG tablet Take 1 tablet by mouth as directed 1-3 times per week  . warfarin (COUMADIN) 2.5 MG tablet TAKE 1-2 TABLETS BY MOUTH DAILY AS DIRECTED BY COUMADIN CLINIC.     Allergies:   Atorvastatin, Statins, Ezetimibe, Codeine, Latex, and Penicillins   Social History   Socioeconomic History  . Marital status: Married    Spouse name: [name]  . Number of children: 3  . Years of education: masters  . Highest education level: Not on file  Occupational History  . Occupation: retired    Comment: [employer]  Tobacco Use  . Smoking status: Never Smoker  . Smokeless tobacco: Never Used  Vaping Use  . Vaping Use: Never used  Substance and Sexual Activity  . Alcohol use: Yes    Comment: Rare  . Drug use: No  . Sexual activity: Not Currently    Birth control/protection: Surgical  Other Topics Concern  . Not on file  Social History Narrative   Pt lives with spouse.    Caffeine Use: 1-2 glasses per week.   Social Determinants of Health   Financial Resource Strain:   . Difficulty of Paying Living Expenses: Not on file  Food Insecurity:   . Worried About Charity fundraiser in the Last Year: Not on file  .  Ran Out of Food in the Last Year: Not on file  Transportation Needs:   . Lack of Transportation (Medical): Not on file  . Lack of Transportation (Non-Medical): Not on file  Physical Activity:   . Days of Exercise per Week: Not on file  . Minutes of Exercise per Session: Not on file  Stress:   . Feeling of Stress : Not on file  Social Connections:   . Frequency of Communication with Friends and Family: Not on file  . Frequency of Social Gatherings with Friends and Family: Not on file  . Attends Religious Services: Not on file  . Active Member of Clubs or Organizations: Not on file  . Attends Archivist Meetings:  Not on file  . Marital Status: Not on file     Family History: The patient's family history includes Alcoholism in her father; Brain cancer in her maternal aunt; Breast cancer in her maternal aunt; CVA in an other family member; Cancer in her maternal grandmother; Diabetes in her mother; Healthy in her sister; Heart disease in an other family member; High Cholesterol in her mother; Hyperlipidemia in an other family member; Hypertension in her mother and another family member; Liver cancer in her father; Memory loss in her mother.  ROS:   Please see the history of present illness.    Stress.  No chest pain.  Just started Repatha in the lipid clinic for LDL 233.  No follow-up labs yet.  No side effects.  All other systems reviewed and are negative.  EKGs/Labs/Other Studies Reviewed:    The following studies were reviewed today: No new data  EKG:  EKG atrial fibrillation with controlled rate at 94 bpm.  Precordial biphasic/T wave inversion.  When compared to the prior tracing from October 2020, the rate is slightly faster today.  Recent Labs: 11/03/2019: ALT 13  Recent Lipid Panel    Component Value Date/Time   CHOL 316 (H) 11/03/2019 1030   TRIG 165 (H) 11/03/2019 1030   HDL 51 11/03/2019 1030   CHOLHDL 6.2 (H) 11/03/2019 1030   CHOLHDL 4 12/26/2013 0824   VLDL  44.8 (H) 12/26/2013 0824   LDLCALC 233 (H) 11/03/2019 1030   LDLDIRECT 106.6 12/26/2013 0824    Physical Exam:    VS:  BP 110/68   Pulse 94   Ht 5\' 5"  (1.651 m)   Wt 147 lb (66.7 kg)   SpO2 95%   BMI 24.46 kg/m     Wt Readings from Last 3 Encounters:  01/09/20 147 lb (66.7 kg)  10/06/19 150 lb (68 kg)  07/11/19 150 lb (68 kg)     GEN: Healthy-appearing. No acute distress HEENT: Normal NECK: No JVD. LYMPHATICS: No lymphadenopathy CARDIAC: Irregularly irregular RR without murmur, gallop, or edema. VASCULAR:  Normal Pulses. No bruits. RESPIRATORY:  Clear to auscultation without rales, wheezing or rhonchi  ABDOMEN: Soft, non-tender, non-distended, No pulsatile mass, MUSCULOSKELETAL: No deformity  SKIN: Warm and dry NEUROLOGIC:  Alert and oriented x 3 PSYCHIATRIC:  Normal affect   ASSESSMENT:    1. Paroxysmal atrial fibrillation (HCC)   2. Cerebrovascular accident (CVA) due to embolism of middle cerebral artery, unspecified blood vessel laterality ([place])   3. Tachycardia-bradycardia syndrome ([place])   4. HYPERCHOLESTEROLEMIA   5. Cardiac pacemaker in situ   6. Essential hypertension   7. Educated about COVID-19 virus infection    PLAN:    In order of problems listed above:  1. She was in A. fib on check-in documented by EKG.  On clinical auscultation she is back in sinus rhythm.  She is adequately anticoagulated.  Her [place] Vascular Score Is 2.  If She Has Increasing Palpitations and Is Having to Take As Needed Metoprolol Frequently, We Will Need to Do a 24 to 48-Hour Monitor to Determine If She Has Developed Permanent Atrial Fibrillation and Whether Addition of Chronic Beta-Blocker Therapy Will Be Needed to Control Rate. 2. No new episodes. 3. Intermittent beta-blocker use. 4. Continue Repatha.  Blood work will be done soon.  Continue low-dose rosuvastatin. 5. Normal function for bradycardia.  Not pacing today. 6. Blood pressure control is excellent on no specific  therapy. 7. Vaccinated and practicing mitigation.   Medication Adjustments/Labs and Tests  Ordered: Current medicines are reviewed at length with the patient today.  Concerns regarding medicines are outlined above.  Orders Placed This Encounter  Procedures  . EKG 12-Lead   No orders of the defined types were placed in this encounter.   There are no Patient Instructions on file for this visit.   Signed, Sinclair Grooms, MD  01/09/2020 10:16 AM    [place]

## 2020-01-09 ENCOUNTER — Ambulatory Visit (INDEPENDENT_AMBULATORY_CARE_PROVIDER_SITE_OTHER): Payer: Medicare Other | Admitting: Interventional Cardiology

## 2020-01-09 ENCOUNTER — Other Ambulatory Visit: Payer: Self-pay

## 2020-01-09 ENCOUNTER — Encounter: Payer: Self-pay | Admitting: Interventional Cardiology

## 2020-01-09 VITALS — BP 110/68 | HR 94 | Ht 65.0 in | Wt 147.0 lb

## 2020-01-09 DIAGNOSIS — I10 Essential (primary) hypertension: Secondary | ICD-10-CM

## 2020-01-09 DIAGNOSIS — E7800 Pure hypercholesterolemia, unspecified: Secondary | ICD-10-CM

## 2020-01-09 DIAGNOSIS — I480 Paroxysmal atrial fibrillation: Secondary | ICD-10-CM

## 2020-01-09 DIAGNOSIS — I63419 Cerebral infarction due to embolism of unspecified middle cerebral artery: Secondary | ICD-10-CM | POA: Diagnosis not present

## 2020-01-09 DIAGNOSIS — Z950 Presence of cardiac pacemaker: Secondary | ICD-10-CM

## 2020-01-09 DIAGNOSIS — I495 Sick sinus syndrome: Secondary | ICD-10-CM

## 2020-01-09 DIAGNOSIS — Z7189 Other specified counseling: Secondary | ICD-10-CM | POA: Diagnosis not present

## 2020-01-09 NOTE — Patient Instructions (Signed)

## 2020-01-16 ENCOUNTER — Ambulatory Visit: Payer: Medicare Other | Admitting: Interventional Cardiology

## 2020-01-23 ENCOUNTER — Telehealth: Payer: Self-pay | Admitting: Interventional Cardiology

## 2020-01-23 ENCOUNTER — Ambulatory Visit (INDEPENDENT_AMBULATORY_CARE_PROVIDER_SITE_OTHER): Payer: Medicare Other

## 2020-01-23 DIAGNOSIS — I495 Sick sinus syndrome: Secondary | ICD-10-CM

## 2020-01-23 NOTE — Telephone Encounter (Signed)
Called the patient back with Dr. Thompson Caul recommendations:   No change needed. Continue to monitor and if symptomatic or having symptoms, let me know. Take Metoprolol Succinate 12.5 mg. She verbalized understanding and is aware to keep a record of daily BP/HR

## 2020-01-23 NOTE — Telephone Encounter (Signed)
Flecainide is not on the med list. Is she using it as a "pill in the pocket?" Take metoprolol succinate 12.5 mg daily. Once daily tartrate won't work.

## 2020-01-23 NOTE — Telephone Encounter (Signed)
No change needed. Continue to monitor and if symptomatic or having symptoms, let me know.

## 2020-01-23 NOTE — Telephone Encounter (Signed)
Pt called in and stated she seen Dr Tamala Julian on 10/4 and she would like to talk with nurse about how to take her med when she feels like see is in afib?      Best number [phone]

## 2020-01-23 NOTE — Telephone Encounter (Signed)
Patient is calling in because she has been experiencing episodes of Tachycardia and Atrial Fibrillation. She does not have HR readings at this time but states she can feel when her heart beats excessively fast & experiences palpitations. She was told by Dr. Tamala Julian during her 10/4 appointment with Dr. Tamala Julian that she should take 12.5 mg Metoprolol when she has these episodes. She states she could take the Metoprolol each day but has not been. She estimates taking it about 3x/week currently. She is questioning if she should also add back her Flecainide when she has a second episode in the afternoon after taking her Metoprolol. Advised patient to being taking the 12.5 mg Metoprolol every day to see if that helps with afternoon break through symptoms and call back at end of the week with an update. Patient is aware that Dr. Tamala Julian is out of office this week and will not be able to advise immediately. Will route to Dr. Tamala Julian and his nurse for further advisement.

## 2020-01-24 LAB — CUP PACEART REMOTE DEVICE CHECK
Battery Remaining Longevity: 125 mo
Battery Voltage: 3.01 V
Brady Statistic AP VP Percent: 0.8 %
Brady Statistic AP VS Percent: 95.35 %
Brady Statistic AS VP Percent: 0.01 %
Brady Statistic AS VS Percent: 3.84 %
Brady Statistic RA Percent Paced: 96.61 %
Brady Statistic RV Percent Paced: 0.81 %
Date Time Interrogation Session: 20211017221823
Implantable Lead Implant Date: 20180914
Implantable Lead Implant Date: 20180914
Implantable Lead Location: 753859
Implantable Lead Location: 753860
Implantable Lead Model: 5076
Implantable Lead Model: 5076
Implantable Pulse Generator Implant Date: 20180914
Lead Channel Impedance Value: 323 Ohm
Lead Channel Impedance Value: 399 Ohm
Lead Channel Impedance Value: 418 Ohm
Lead Channel Impedance Value: 513 Ohm
Lead Channel Pacing Threshold Amplitude: 0.5 V
Lead Channel Pacing Threshold Amplitude: 0.875 V
Lead Channel Pacing Threshold Pulse Width: 0.4 ms
Lead Channel Pacing Threshold Pulse Width: 0.4 ms
Lead Channel Sensing Intrinsic Amplitude: 13.125 mV
Lead Channel Sensing Intrinsic Amplitude: 13.125 mV
Lead Channel Sensing Intrinsic Amplitude: 3.75 mV
Lead Channel Sensing Intrinsic Amplitude: 3.75 mV
Lead Channel Setting Pacing Amplitude: 1.5 V
Lead Channel Setting Pacing Amplitude: 2.5 V
Lead Channel Setting Pacing Pulse Width: 0.4 ms
Lead Channel Setting Sensing Sensitivity: 0.9 mV

## 2020-01-24 MED ORDER — METOPROLOL SUCCINATE ER 25 MG PO TB24
12.5000 mg | ORAL_TABLET | Freq: Every day | ORAL | 0 refills | Status: DC
Start: 2020-01-24 — End: 2022-07-01

## 2020-01-24 NOTE — Addendum Note (Signed)
Addended by: Nuala Alpha on: 01/24/2020 02:49 PM   Modules accepted: Orders

## 2020-01-24 NOTE — Telephone Encounter (Signed)
Patient's daughter, Almyra Free, is following up. She is requesting that patient only take the non-generic brand for Metoprolol moving forward. She would like a call back to discuss.

## 2020-01-24 NOTE — Telephone Encounter (Signed)
Spoke with the pts daughter and she is calling in to confirm that Dr. Tamala Julian is wanting the pt to now start taking her Metoprolol Succinate 12.5 mg po daily vs PRN.  Did confirm with her that yes, the pt should now be taking Metoprolol Succinate 12.5 mg po daily, as indicated in this message by Dr. Tamala Julian. Daughter states they will give this a try and have pt start taking this daily for her afib.  Daughter states they will record her BP/HR for one week, after med administration, and send those recordings to Dr. Tamala Julian and his RN to further review and advise on thereafter.  Daughter confirmed that if pts symptoms reoccur between now and when she sends those readings, she will call the office back and let Dr. Tamala Julian know.  Confirmed the pharmacy of choice with the pts daughter, to send the Metoprolol succinate 12.5 mg po daily to.  Daughter verbalized understanding and agrees with this plan. Will send this message to Dr. Thompson Caul RN to further review and follow-up with the pt when recordings are received in a week.

## 2020-01-26 NOTE — Progress Notes (Signed)
Remote pacemaker transmission.   

## 2020-01-30 ENCOUNTER — Other Ambulatory Visit: Payer: Self-pay

## 2020-01-30 ENCOUNTER — Ambulatory Visit (INDEPENDENT_AMBULATORY_CARE_PROVIDER_SITE_OTHER): Payer: Medicare Other | Admitting: Pharmacist

## 2020-01-30 DIAGNOSIS — Z5181 Encounter for therapeutic drug level monitoring: Secondary | ICD-10-CM

## 2020-01-30 DIAGNOSIS — I480 Paroxysmal atrial fibrillation: Secondary | ICD-10-CM | POA: Diagnosis not present

## 2020-01-30 LAB — POCT INR: INR: 2 (ref 2.0–3.0)

## 2020-02-07 DIAGNOSIS — H02055 Trichiasis without entropian left lower eyelid: Secondary | ICD-10-CM | POA: Diagnosis not present

## 2020-02-07 DIAGNOSIS — H02054 Trichiasis without entropian left upper eyelid: Secondary | ICD-10-CM | POA: Diagnosis not present

## 2020-02-08 ENCOUNTER — Other Ambulatory Visit: Payer: Self-pay | Admitting: Interventional Cardiology

## 2020-02-09 DIAGNOSIS — Z23 Encounter for immunization: Secondary | ICD-10-CM | POA: Diagnosis not present

## 2020-02-29 DIAGNOSIS — K219 Gastro-esophageal reflux disease without esophagitis: Secondary | ICD-10-CM | POA: Diagnosis not present

## 2020-02-29 DIAGNOSIS — I10 Essential (primary) hypertension: Secondary | ICD-10-CM | POA: Diagnosis not present

## 2020-02-29 DIAGNOSIS — I4891 Unspecified atrial fibrillation: Secondary | ICD-10-CM | POA: Diagnosis not present

## 2020-02-29 DIAGNOSIS — E785 Hyperlipidemia, unspecified: Secondary | ICD-10-CM | POA: Diagnosis not present

## 2020-02-29 DIAGNOSIS — E039 Hypothyroidism, unspecified: Secondary | ICD-10-CM | POA: Diagnosis not present

## 2020-02-29 DIAGNOSIS — G459 Transient cerebral ischemic attack, unspecified: Secondary | ICD-10-CM | POA: Diagnosis not present

## 2020-02-29 DIAGNOSIS — M8580 Other specified disorders of bone density and structure, unspecified site: Secondary | ICD-10-CM | POA: Diagnosis not present

## 2020-03-12 ENCOUNTER — Other Ambulatory Visit: Payer: Self-pay

## 2020-03-12 ENCOUNTER — Ambulatory Visit (INDEPENDENT_AMBULATORY_CARE_PROVIDER_SITE_OTHER): Payer: Medicare Other

## 2020-03-12 DIAGNOSIS — I480 Paroxysmal atrial fibrillation: Secondary | ICD-10-CM

## 2020-03-12 DIAGNOSIS — Z85820 Personal history of malignant melanoma of skin: Secondary | ICD-10-CM | POA: Diagnosis not present

## 2020-03-12 DIAGNOSIS — B351 Tinea unguium: Secondary | ICD-10-CM | POA: Diagnosis not present

## 2020-03-12 DIAGNOSIS — Z5181 Encounter for therapeutic drug level monitoring: Secondary | ICD-10-CM | POA: Diagnosis not present

## 2020-03-12 DIAGNOSIS — D225 Melanocytic nevi of trunk: Secondary | ICD-10-CM | POA: Diagnosis not present

## 2020-03-12 DIAGNOSIS — L905 Scar conditions and fibrosis of skin: Secondary | ICD-10-CM | POA: Diagnosis not present

## 2020-03-12 DIAGNOSIS — L821 Other seborrheic keratosis: Secondary | ICD-10-CM | POA: Diagnosis not present

## 2020-03-12 DIAGNOSIS — L300 Nummular dermatitis: Secondary | ICD-10-CM | POA: Diagnosis not present

## 2020-03-12 DIAGNOSIS — L509 Urticaria, unspecified: Secondary | ICD-10-CM | POA: Diagnosis not present

## 2020-03-12 DIAGNOSIS — L910 Hypertrophic scar: Secondary | ICD-10-CM | POA: Diagnosis not present

## 2020-03-12 DIAGNOSIS — D1801 Hemangioma of skin and subcutaneous tissue: Secondary | ICD-10-CM | POA: Diagnosis not present

## 2020-03-12 LAB — POCT INR: INR: 1.8 — AB (ref 2.0–3.0)

## 2020-03-12 NOTE — Patient Instructions (Signed)
Take 3 tablets today only and Continue with 2 tablets every Monday and 1.5 tablets all other days of the week.  Next INR in 4 weeks.

## 2020-03-19 DIAGNOSIS — H02055 Trichiasis without entropian left lower eyelid: Secondary | ICD-10-CM | POA: Diagnosis not present

## 2020-03-19 DIAGNOSIS — H02005 Unspecified entropion of left lower eyelid: Secondary | ICD-10-CM | POA: Diagnosis not present

## 2020-03-27 DIAGNOSIS — F418 Other specified anxiety disorders: Secondary | ICD-10-CM | POA: Diagnosis not present

## 2020-03-27 DIAGNOSIS — I10 Essential (primary) hypertension: Secondary | ICD-10-CM | POA: Diagnosis not present

## 2020-03-27 DIAGNOSIS — D0361 Melanoma in situ of right upper limb, including shoulder: Secondary | ICD-10-CM | POA: Diagnosis not present

## 2020-03-27 DIAGNOSIS — E039 Hypothyroidism, unspecified: Secondary | ICD-10-CM | POA: Diagnosis not present

## 2020-03-27 DIAGNOSIS — Z0000 Encounter for general adult medical examination without abnormal findings: Secondary | ICD-10-CM | POA: Diagnosis not present

## 2020-03-27 DIAGNOSIS — J449 Chronic obstructive pulmonary disease, unspecified: Secondary | ICD-10-CM | POA: Diagnosis not present

## 2020-03-27 DIAGNOSIS — E785 Hyperlipidemia, unspecified: Secondary | ICD-10-CM | POA: Diagnosis not present

## 2020-03-27 DIAGNOSIS — M8580 Other specified disorders of bone density and structure, unspecified site: Secondary | ICD-10-CM | POA: Diagnosis not present

## 2020-03-27 DIAGNOSIS — I495 Sick sinus syndrome: Secondary | ICD-10-CM | POA: Diagnosis not present

## 2020-03-27 DIAGNOSIS — N393 Stress incontinence (female) (male): Secondary | ICD-10-CM | POA: Diagnosis not present

## 2020-03-27 DIAGNOSIS — G459 Transient cerebral ischemic attack, unspecified: Secondary | ICD-10-CM | POA: Diagnosis not present

## 2020-03-27 DIAGNOSIS — R1312 Dysphagia, oropharyngeal phase: Secondary | ICD-10-CM | POA: Diagnosis not present

## 2020-04-04 DIAGNOSIS — M8580 Other specified disorders of bone density and structure, unspecified site: Secondary | ICD-10-CM | POA: Diagnosis not present

## 2020-04-04 DIAGNOSIS — E785 Hyperlipidemia, unspecified: Secondary | ICD-10-CM | POA: Diagnosis not present

## 2020-04-04 DIAGNOSIS — I10 Essential (primary) hypertension: Secondary | ICD-10-CM | POA: Diagnosis not present

## 2020-04-04 DIAGNOSIS — K219 Gastro-esophageal reflux disease without esophagitis: Secondary | ICD-10-CM | POA: Diagnosis not present

## 2020-04-04 DIAGNOSIS — G459 Transient cerebral ischemic attack, unspecified: Secondary | ICD-10-CM | POA: Diagnosis not present

## 2020-04-04 DIAGNOSIS — J449 Chronic obstructive pulmonary disease, unspecified: Secondary | ICD-10-CM | POA: Diagnosis not present

## 2020-04-04 DIAGNOSIS — I4891 Unspecified atrial fibrillation: Secondary | ICD-10-CM | POA: Diagnosis not present

## 2020-04-04 DIAGNOSIS — E039 Hypothyroidism, unspecified: Secondary | ICD-10-CM | POA: Diagnosis not present

## 2020-04-05 ENCOUNTER — Other Ambulatory Visit (HOSPITAL_COMMUNITY): Payer: Self-pay | Admitting: *Deleted

## 2020-04-05 DIAGNOSIS — R1310 Dysphagia, unspecified: Secondary | ICD-10-CM

## 2020-04-16 ENCOUNTER — Other Ambulatory Visit: Payer: Self-pay | Admitting: Pharmacist Clinician (PhC)/ Clinical Pharmacy Specialist

## 2020-04-16 ENCOUNTER — Other Ambulatory Visit: Payer: Self-pay

## 2020-04-16 ENCOUNTER — Ambulatory Visit (INDEPENDENT_AMBULATORY_CARE_PROVIDER_SITE_OTHER): Payer: Medicare Other

## 2020-04-16 DIAGNOSIS — I480 Paroxysmal atrial fibrillation: Secondary | ICD-10-CM

## 2020-04-16 DIAGNOSIS — Z5181 Encounter for therapeutic drug level monitoring: Secondary | ICD-10-CM | POA: Diagnosis not present

## 2020-04-16 DIAGNOSIS — E7800 Pure hypercholesterolemia, unspecified: Secondary | ICD-10-CM

## 2020-04-16 LAB — POCT INR: INR: 2.3 (ref 2.0–3.0)

## 2020-04-16 NOTE — Patient Instructions (Signed)
Continue with 2 tablets every Monday and 1.5 tablets all other days of the week.  Next INR in 6 weeks.  

## 2020-04-17 ENCOUNTER — Ambulatory Visit (HOSPITAL_COMMUNITY)
Admission: RE | Admit: 2020-04-17 | Discharge: 2020-04-17 | Disposition: A | Discharge status: Home / Self Care | Payer: Medicare Other | Source: Ambulatory Visit | Attending: Family Medicine | Admitting: Family Medicine

## 2020-04-17 DIAGNOSIS — R1310 Dysphagia, unspecified: Secondary | ICD-10-CM | POA: Insufficient documentation

## 2020-04-21 DIAGNOSIS — J329 Chronic sinusitis, unspecified: Secondary | ICD-10-CM | POA: Diagnosis not present

## 2020-04-22 DIAGNOSIS — R42 Dizziness and giddiness: Secondary | ICD-10-CM | POA: Diagnosis not present

## 2020-04-22 DIAGNOSIS — R0902 Hypoxemia: Secondary | ICD-10-CM | POA: Diagnosis not present

## 2020-04-22 DIAGNOSIS — R110 Nausea: Secondary | ICD-10-CM | POA: Diagnosis not present

## 2020-04-23 ENCOUNTER — Ambulatory Visit (INDEPENDENT_AMBULATORY_CARE_PROVIDER_SITE_OTHER): Payer: Medicare Other

## 2020-04-23 DIAGNOSIS — I495 Sick sinus syndrome: Secondary | ICD-10-CM | POA: Diagnosis not present

## 2020-04-24 LAB — CUP PACEART REMOTE DEVICE CHECK
Battery Remaining Longevity: 122 mo
Battery Voltage: 3.01 V
Brady Statistic AP VP Percent: 0.31 %
Brady Statistic AP VS Percent: 98.58 %
Brady Statistic AS VP Percent: 0 %
Brady Statistic AS VS Percent: 1.1 %
Brady Statistic RA Percent Paced: 99.03 %
Brady Statistic RV Percent Paced: 0.32 %
Date Time Interrogation Session: 20220116222649
Implantable Lead Implant Date: 20180914
Implantable Lead Implant Date: 20180914
Implantable Lead Location: 753859
Implantable Lead Location: 753860
Implantable Lead Model: 5076
Implantable Lead Model: 5076
Implantable Pulse Generator Implant Date: 20180914
Lead Channel Impedance Value: 323 Ohm
Lead Channel Impedance Value: 399 Ohm
Lead Channel Impedance Value: 399 Ohm
Lead Channel Impedance Value: 494 Ohm
Lead Channel Pacing Threshold Amplitude: 0.625 V
Lead Channel Pacing Threshold Amplitude: 0.875 V
Lead Channel Pacing Threshold Pulse Width: 0.4 ms
Lead Channel Pacing Threshold Pulse Width: 0.4 ms
Lead Channel Sensing Intrinsic Amplitude: 13.25 mV
Lead Channel Sensing Intrinsic Amplitude: 13.25 mV
Lead Channel Sensing Intrinsic Amplitude: 3.25 mV
Lead Channel Sensing Intrinsic Amplitude: 3.25 mV
Lead Channel Setting Pacing Amplitude: 1.5 V
Lead Channel Setting Pacing Amplitude: 2.5 V
Lead Channel Setting Pacing Pulse Width: 0.4 ms
Lead Channel Setting Sensing Sensitivity: 0.9 mV

## 2020-04-25 ENCOUNTER — Other Ambulatory Visit: Payer: Self-pay

## 2020-04-25 ENCOUNTER — Ambulatory Visit (INDEPENDENT_AMBULATORY_CARE_PROVIDER_SITE_OTHER): Payer: Medicare Other

## 2020-04-25 DIAGNOSIS — Z5181 Encounter for therapeutic drug level monitoring: Secondary | ICD-10-CM

## 2020-04-25 DIAGNOSIS — I480 Paroxysmal atrial fibrillation: Secondary | ICD-10-CM

## 2020-04-25 LAB — POCT INR: INR: 2.8 (ref 2.0–3.0)

## 2020-04-25 NOTE — Patient Instructions (Signed)
Continue with 2 tablets every Monday and 1.5 tablets all other days of the week.  Next INR in 4 weeks.

## 2020-04-26 ENCOUNTER — Encounter: Payer: Medicare Other | Admitting: Nurse Practitioner

## 2020-04-30 ENCOUNTER — Other Ambulatory Visit: Payer: Self-pay | Admitting: Interventional Cardiology

## 2020-05-02 ENCOUNTER — Encounter (HOSPITAL_COMMUNITY): Payer: Self-pay

## 2020-05-04 ENCOUNTER — Encounter: Payer: Medicare Other | Admitting: Physician Assistant

## 2020-05-07 NOTE — Progress Notes (Signed)
Remote pacemaker transmission.   

## 2020-05-08 DIAGNOSIS — H02005 Unspecified entropion of left lower eyelid: Secondary | ICD-10-CM | POA: Diagnosis not present

## 2020-05-08 DIAGNOSIS — H02055 Trichiasis without entropian left lower eyelid: Secondary | ICD-10-CM | POA: Diagnosis not present

## 2020-05-27 NOTE — Progress Notes (Signed)
Cardiology Office Note Date:  05/28/2020  Patient ID:  Cathy, Bernard 1945-08-27, MRN [id number] PCP:  Aura Dials, MD  Cardiologist:  Dr. Tamala Julian Electrophysiologist: Dr. Rayann Heman    Chief Complaint:  annual visit  History of Present Illness: [name] is a 75 y.o. female with history of anxiety, COPD, deaf (R ear), stroke, HTN, HLD, hypothyroidism, Afib, tachy-brady w/PPM.  She comes today to be seen for dr. Rayann Heman, last seen by him 2018 >> annual visits with ALynnell Jude since then, last jan 2021, doing well, no changes were made.  More recently saw Dr. Tamala Julian oct 2021, unfortunately increased personal stressors and some increased palpitations that she was using an Prn 1/3 tab of her metoprolol for.  She arrived in AFib though had spontaneous conversion while there. Planned for monitoring to guide meds. (not done)  Via phone notes advised to take metoprolol 12.5mg  daily (seems 2/2 palpitations)   TODAY She feels the daily metoprolol has very well controlled her palpitations. These were frequent but fleeting episodes of fast beats that were on/off all day before she went to daily dosing BB. Since then, has had none.  She denies CP, but does admit to feeling like she is getting unusually winded with exertion. No rest SOB, no nocturnal or positional SOB She of late has been doing quite a lot around the house and is very active, but in the last 74mo feels like she will have to pace herself. Mentions a few months ago her son asked her why she was so SOB. No near syncope or syncope, but has had a couple lightheaded spells upon standing   She has noticed some bruising but again doing more physically of late as well, no bleeding or signs of bleeding otherwise, sees CC today  Device information MDT dual chamber PPM, implanted 2018  AFib Hx diagnosis goes as far back as her Epic chart (2011)  AAD Hx Flecainide started 2018 BID eventually pill in pocket at  last EP visit Jan 2011 pt states taken off her list because she had not used it in years   Past Medical History:  Diagnosis Date  . Acoustic neuroma (HCC)    Hx of right ear acoustic neuroma, removed in 1999. No hearing in right ear.  [name] Anxiety    Hx of, responds to Wellbutrin  . Basal cell carcinoma    s/p MOHS surgery in 03/2011  . Cataract   . Chronic anticoagulation 11/24/2016  . Chronic cholecystitis with calculus 05/13/2013  . Closed fracture of left lateral malleolus 08/10/2017  . COPD (chronic obstructive pulmonary disease) ([place])   . Deafness in right ear   . Embolic stroke ([place]) 0/34/9179  . Family history of adverse reaction to anesthesia    Family hx of PONV  . Fracture    left lateral malleolus fracture  . GERD (gastroesophageal reflux disease)   . Headache(784.0)    MIGRAINES  . Hearing loss    Only in right ear, from an acoustic neuroma. S/P removal in 1999.   [name] History of hiatal hernia   . History of neck pain    Responds to Flexeril  . History of shingles   . Hypercholesteremia   . Hypertension   . Hypothyroidism   . Melanoma ([place]) 2018   right arm-surgery only  . PAF (paroxysmal atrial fibrillation) ([place])    2 brief episodes of Afib recorded on monitor 02/2007  . Paroxysmal atrial fibrillation (HCC)   . PONV (postoperative nausea  and vomiting)   . Presence of permanent cardiac pacemaker 2018   for A-fib  . Sick sinus syndrome ([place]) 12/19/2016  . Sinus bradycardia   . Tachycardia-bradycardia syndrome ([place]) 11/24/2016  . TIA (transient ischemic attack) 2011   On coumadin. Asymptomatic, without recurrence.    Past Surgical History:  Procedure Laterality Date  . ABDOMINAL HYSTERECTOMY    . APPENDECTOMY    . BREAST BIOPSY     X 3  . BREAST CYST ASPIRATION Left 50 yrs ago per pt  . BREAST EXCISIONAL BIOPSY Left 50 yrs ago  . BREAST LUMPECTOMY WITH RADIOACTIVE SEED LOCALIZATION Left 06/21/2019   Procedure: LEFT BREAST LUMPECTOMY WITH RADIOACTIVE SEED  LOCALIZATION;  Surgeon: Donnie Mesa, MD;  Location: [facility];  Service: General;  Laterality: Left;  . CHOLECYSTECTOMY N/A 05/26/2013   Procedure: LAPAROSCOPIC CHOLECYSTECTOMY WITH INTRAOPERATIVE CHOLANGIOGRAM;  Surgeon: Imogene Burn. Georgette Dover, MD;  Location: [facility];  Service: General;  Laterality: N/A;  . CRANIECTOMY FOR EXCISION OF ACOUSTIC NEUROMA Right 1999  . INTRAOCULAR LENS INSERTION     Dr. Talbert Forest  . MOHS SURGERY  03/2011   Basal cell skin cancer  . ORIF ANKLE FRACTURE Left 08/10/2017   Procedure: OPEN REDUCTION INTERNAL FIXATION (ORIF) LEFT ANKLE FRACTURE;  Surgeon: Rod Can, MD;  Location: [facility];  Service: Orthopedics;  Laterality: Left;  . PACEMAKER IMPLANT N/A 12/19/2016   Medtronic Azure XT MRI conditional dual-chamber pacemaker for symptomatic sinus bradycardia by Dr Rayann Heman  . TONSILLECTOMY      Current Outpatient Medications  Medication Sig Dispense Refill  . Cholecalciferol (VITAMIN D3 PO) Take by mouth.    . clonazePAM (KLONOPIN) 0.5 MG tablet Take 0.5 mg by mouth daily.    . fluticasone (FLONASE) 50 MCG/ACT nasal spray Place 1 spray into both nostrils daily.    . JUBLIA 10 % SOLN apply to affected nails qd    . ketoconazole (NIZORAL) 2 % cream apply bid to affected nails and surrounding skin    . levothyroxine (SYNTHROID, LEVOTHROID) 88 MCG tablet Take 88 mcg by mouth daily before breakfast.    . loratadine (CLARITIN) 10 MG tablet Take 10 mg by mouth daily as needed for allergies.    . metoprolol succinate (TOPROL XL) 25 MG 24 hr tablet Take 0.5 tablets (12.5 mg total) by mouth daily. 45 tablet 0  . pantoprazole (PROTONIX) 40 MG tablet Take 40 mg by mouth daily as needed (acid reflux).    [name] REPATHA SURECLICK 638 MG/ML SOAJ INJECT CONTENTS OF 1 PEN SUBCUTANEOUSLY EVERY 14 DAYS. 2 mL 11  . rosuvastatin (CRESTOR) 5 MG tablet Take 1 tablet by mouth as directed 1-3 times per week 12 tablet 3  . triamcinolone (KENALOG) 0.1 % apply bid to affected areas prn  itching    . vitamin B-12 (CYANOCOBALAMIN) 1000 MCG tablet 2 tablets    . warfarin (COUMADIN) 2.5 MG tablet TAKE 1 AND 1/2-2 TABLETS BY MOUTH DAILY AS DIRECTED BY COUMADIN CLINIC. 135 tablet 0   No current facility-administered medications for this visit.    Allergies:   Atorvastatin, Statins, Ezetimibe, Codeine, Latex, and Penicillins   Social History:  The patient  reports that she has never smoked. She has never used smokeless tobacco. She reports current alcohol use. She reports that she does not use drugs.   Family History:  The patient's family history includes Alcoholism in her father; Brain cancer in her maternal aunt; Breast cancer in her maternal aunt; CVA in an other family member; Cancer  in her maternal grandmother; Diabetes in her mother; Healthy in her sister; Heart disease in an other family member; High Cholesterol in her mother; Hyperlipidemia in an other family member; Hypertension in her mother and another family member; Liver cancer in her father; Memory loss in her mother.  ROS:  Please see the history of present illness.    All other systems are reviewed and otherwise negative.   PHYSICAL EXAM:  VS:  BP 100/60   Pulse 88   Ht 5\' 5"  (1.651 m)   Wt 152 lb 6.4 oz (69.1 kg)   SpO2 97%   BMI 25.36 kg/m  BMI: Body mass index is 25.36 kg/m. Well nourished, well developed, in no acute distress HEENT: normocephalic, atraumatic Neck: no JVD, carotid bruits or masses Cardiac:  RRR; no significant murmurs, no rubs, or gallops Lungs:  CTA b/l, no wheezing, rhonchi or rales Abd: soft, nontender MS: no deformity or Atrophy Ext:  no edema Skin: warm and dry, no rash Neuro:  No gross deficits appreciated Psych: euthymic mood, full affect  PPM site is stable, no tethering or discomfort   EKG:  Not done today  Device interrogation done today and reviewed by myself:  Battery and lead measurements are OK AF burden <1% Since jan 2021 26VT, 35fast AV, 3 AT/AF episodes,  all available EGMs are reviewed NONE are VT 2 are true AFlutter, longest 35min All others are 1:1 AT   12/11/2016: stress myoview  Clinically and electrically negative for ischemia  Normal perfusion. No ischemia or scar  This is a low risk study.  LVEF is 62%   12/02/2016: TTE Study Conclusions  - Left ventricle: The cavity size was normal. Wall thickness was  normal. Systolic function was normal. The estimated ejection  fraction was in the range of 55% to 60%. Wall motion was normal;  there were no regional wall motion abnormalities. Features are  consistent with a pseudonormal left ventricular filling pattern,  with concomitant abnormal relaxation and increased filling  pressure (grade 2 diastolic dysfunction).  - Aortic valve: There was no stenosis.  - Mitral valve: Mildly calcified annulus. There was mild  regurgitation.  - Left atrium: The atrium was mildly to moderately dilated.  - Right ventricle: The cavity size was normal. Systolic function  was normal.  - Right atrium: The atrium was mildly dilated.  - Tricuspid valve: Peak RV-RA gradient (S): 27 mm Hg.  - Pulmonary arteries: PA peak pressure: 30 mm Hg (S).  - Inferior vena cava: The vessel was normal in size. The  respirophasic diameter changes were in the normal range (>= 50%),  consistent with normal central venous pressure.   Impressions:  - Normal LV size with EF 55-60%. Moderate diastolic dysfunction.  Normal RV size and systolic function. Mild mitral regurgitation.     Recent Labs: 11/03/2019: ALT 13  11/03/2019: Chol/HDL Ratio 6.2; Cholesterol, Total 316; HDL 51; LDL Chol Calc (NIH) 233; Triglycerides 165   CrCl cannot be calculated (Patient's most recent lab result is older than the maximum 21 days allowed.).   Wt Readings from Last 3 Encounters:  05/28/20 152 lb 6.4 oz (69.1 kg)  01/09/20 147 lb (66.7 kg)  10/06/19 150 lb (68 kg)     Other studies reviewed: Additional  studies/records reviewed today include: summarized above  ASSESSMENT AND PLAN:  1. PPM     Intact function  2. paroxysmal Afib     AT     CHA2DS2Vasc is 6,  On warfarin     <  0.2%     Palpitations are resolved on daily low dose BB  3. Mild orthostatic symptoms     BP is lowish today     She has room for improvement in her oral hydration and she is quite strict about sodium, suggest that she soul probably loosen her sodium restriction some and asked her to hydrate better  4. DOE     Noted in the last 26mo     Has been years for an echo, will update this, she is known to have COPD as well.she does not appear volume OL, infact, suspect probably a little dehydrated.         Disposition: F/u with remotes as usual, in c linic in a year with EP, sooner if needed with cardiology, pending her echo  Current medicines are reviewed at length with the patient today.  The patient did not have any concerns regarding medicines.  Venetia Night, PA-C 05/28/2020 4:50 PM     [place] [address] [place] [address] [phone] (office)  [phone] (fax)

## 2020-05-28 ENCOUNTER — Ambulatory Visit (INDEPENDENT_AMBULATORY_CARE_PROVIDER_SITE_OTHER): Payer: Medicare Other | Admitting: Physician Assistant

## 2020-05-28 ENCOUNTER — Other Ambulatory Visit: Payer: Self-pay

## 2020-05-28 ENCOUNTER — Encounter: Payer: Self-pay | Admitting: Physician Assistant

## 2020-05-28 ENCOUNTER — Ambulatory Visit (INDEPENDENT_AMBULATORY_CARE_PROVIDER_SITE_OTHER): Payer: Medicare Other

## 2020-05-28 VITALS — BP 100/60 | HR 88 | Ht 65.0 in | Wt 152.4 lb

## 2020-05-28 DIAGNOSIS — I495 Sick sinus syndrome: Secondary | ICD-10-CM | POA: Diagnosis not present

## 2020-05-28 DIAGNOSIS — I471 Supraventricular tachycardia: Secondary | ICD-10-CM

## 2020-05-28 DIAGNOSIS — Z5181 Encounter for therapeutic drug level monitoring: Secondary | ICD-10-CM | POA: Diagnosis not present

## 2020-05-28 DIAGNOSIS — R0602 Shortness of breath: Secondary | ICD-10-CM | POA: Diagnosis not present

## 2020-05-28 DIAGNOSIS — Z950 Presence of cardiac pacemaker: Secondary | ICD-10-CM | POA: Diagnosis not present

## 2020-05-28 DIAGNOSIS — I480 Paroxysmal atrial fibrillation: Secondary | ICD-10-CM

## 2020-05-28 LAB — CUP PACEART INCLINIC DEVICE CHECK
Battery Remaining Longevity: 121 mo
Battery Voltage: 3 V
Brady Statistic AP VP Percent: 0.81 %
Brady Statistic AP VS Percent: 96.04 %
Brady Statistic AS VP Percent: 0 %
Brady Statistic AS VS Percent: 3.14 %
Brady Statistic RA Percent Paced: 97.33 %
Brady Statistic RV Percent Paced: 0.82 %
Date Time Interrogation Session: 20220221161713
Implantable Lead Implant Date: 20180914
Implantable Lead Implant Date: 20180914
Implantable Lead Location: 753859
Implantable Lead Location: 753860
Implantable Lead Model: 5076
Implantable Lead Model: 5076
Implantable Pulse Generator Implant Date: 20180914
Lead Channel Impedance Value: 342 Ohm
Lead Channel Impedance Value: 418 Ohm
Lead Channel Impedance Value: 475 Ohm
Lead Channel Impedance Value: 551 Ohm
Lead Channel Pacing Threshold Amplitude: 0.625 V
Lead Channel Pacing Threshold Amplitude: 0.75 V
Lead Channel Pacing Threshold Pulse Width: 0.4 ms
Lead Channel Pacing Threshold Pulse Width: 0.4 ms
Lead Channel Sensing Intrinsic Amplitude: 12.625 mV
Lead Channel Sensing Intrinsic Amplitude: 18 mV
Lead Channel Sensing Intrinsic Amplitude: 2.125 mV
Lead Channel Sensing Intrinsic Amplitude: 2.875 mV
Lead Channel Setting Pacing Amplitude: 1.5 V
Lead Channel Setting Pacing Amplitude: 2.5 V
Lead Channel Setting Pacing Pulse Width: 0.4 ms
Lead Channel Setting Sensing Sensitivity: 0.9 mV

## 2020-05-28 LAB — POCT INR: INR: 2.1 (ref 2.0–3.0)

## 2020-05-28 NOTE — Patient Instructions (Signed)
Medication Instructions:  Your physician recommends that you continue on your current medications as directed. Please refer to the Current Medication list given to you today.  *If you need a refill on your cardiac medications before your next appointment, please call your pharmacy*   Lab Work: [place]   If you have labs (blood work) drawn today and your tests are completely normal, you will receive your results only by: [name] MyChart Message (if you have MyChart) OR . A paper copy in the mail If you have any lab test that is abnormal or we need to change your treatment, we will call you to review the results.   Testing/Procedures:[name].Your physician has requested that you have an echocardiogram. Echocardiography is a painless test that uses sound waves to create images of your heart. It provides your doctor with information about the size and shape of your heart and how well your heart's chambers and valves are working. This procedure takes approximately one hour. There are no restrictions for this procedure.   Follow-Up: At [facility], you and your health needs are our priority.  As part of our continuing mission to provide you with exceptional heart care, we have created designated Provider Care Teams.  These Care Teams include your primary Cardiologist (physician) and Advanced Practice Providers (APPs -  Physician Assistants and Nurse Practitioners) who all work together to provide you with the care you need, when you need it.  We recommend signing up for the patient portal called "MyChart".  Sign up information is provided on this After Visit Summary.  MyChart is used to connect with patients for Virtual Visits (Telemedicine).  Patients are able to view lab/test results, encounter notes, upcoming appointments, etc.  Non-urgent messages can be sent to your provider as well.   To learn more about what you can do with MyChart, go to [email or web address].    Your next appointment:    1 year(s)  The format for your next appointment:   In Person  Provider:   You may see Dr. Rayann Heman  or one of the following Advanced Practice Providers on your designated Care Team:    Chanetta Marshall, NP  Tommye Standard, PA-C  Legrand Como "Jonni Sanger" Chalmers Cater, [place]    Other Instructions  MAKE SURE YOU ARE STAYING  [place]

## 2020-05-28 NOTE — Patient Instructions (Signed)
Description    Continue on same dosage 1.5 tablets daily except 2 tablets on Mondays. Next INR in 4 weeks.

## 2020-06-04 DIAGNOSIS — K219 Gastro-esophageal reflux disease without esophagitis: Secondary | ICD-10-CM | POA: Diagnosis not present

## 2020-06-04 DIAGNOSIS — J449 Chronic obstructive pulmonary disease, unspecified: Secondary | ICD-10-CM | POA: Diagnosis not present

## 2020-06-04 DIAGNOSIS — M8580 Other specified disorders of bone density and structure, unspecified site: Secondary | ICD-10-CM | POA: Diagnosis not present

## 2020-06-04 DIAGNOSIS — G459 Transient cerebral ischemic attack, unspecified: Secondary | ICD-10-CM | POA: Diagnosis not present

## 2020-06-04 DIAGNOSIS — I10 Essential (primary) hypertension: Secondary | ICD-10-CM | POA: Diagnosis not present

## 2020-06-04 DIAGNOSIS — I4891 Unspecified atrial fibrillation: Secondary | ICD-10-CM | POA: Diagnosis not present

## 2020-06-04 DIAGNOSIS — E785 Hyperlipidemia, unspecified: Secondary | ICD-10-CM | POA: Diagnosis not present

## 2020-06-04 DIAGNOSIS — E039 Hypothyroidism, unspecified: Secondary | ICD-10-CM | POA: Diagnosis not present

## 2020-06-25 ENCOUNTER — Ambulatory Visit (INDEPENDENT_AMBULATORY_CARE_PROVIDER_SITE_OTHER): Payer: Medicare Other | Admitting: *Deleted

## 2020-06-25 ENCOUNTER — Other Ambulatory Visit: Payer: Self-pay

## 2020-06-25 ENCOUNTER — Ambulatory Visit (HOSPITAL_COMMUNITY): Payer: Medicare Other | Attending: Cardiovascular Disease

## 2020-06-25 DIAGNOSIS — R0602 Shortness of breath: Secondary | ICD-10-CM | POA: Diagnosis not present

## 2020-06-25 DIAGNOSIS — I480 Paroxysmal atrial fibrillation: Secondary | ICD-10-CM | POA: Diagnosis not present

## 2020-06-25 DIAGNOSIS — Z5181 Encounter for therapeutic drug level monitoring: Secondary | ICD-10-CM | POA: Diagnosis not present

## 2020-06-25 LAB — ECHOCARDIOGRAM COMPLETE
Area-P 1/2: 5.73 cm2
MV M vel: 4.37 m/s
MV Peak grad: 76.4 mmHg
P 1/2 time: 877 msec
Radius: 0.5 cm
S' Lateral: 3 cm

## 2020-06-25 LAB — POCT INR: INR: 2.7 (ref 2.0–3.0)

## 2020-06-25 NOTE — Patient Instructions (Signed)
Description   Continue taking Warfarin 1.5 tablets daily except 2 tablets on Mondays. Next INR in 4 weeks. Coumadin Clinic [phone]

## 2020-06-26 DIAGNOSIS — L820 Inflamed seborrheic keratosis: Secondary | ICD-10-CM | POA: Diagnosis not present

## 2020-06-26 DIAGNOSIS — L570 Actinic keratosis: Secondary | ICD-10-CM | POA: Diagnosis not present

## 2020-06-27 ENCOUNTER — Telehealth: Payer: Self-pay | Admitting: Interventional Cardiology

## 2020-06-27 NOTE — Telephone Encounter (Signed)
Patient states she is returning a call from today. I did not see any notes. 

## 2020-06-29 DIAGNOSIS — R42 Dizziness and giddiness: Secondary | ICD-10-CM | POA: Diagnosis not present

## 2020-06-29 DIAGNOSIS — M7989 Other specified soft tissue disorders: Secondary | ICD-10-CM | POA: Diagnosis not present

## 2020-07-02 ENCOUNTER — Other Ambulatory Visit: Payer: Self-pay | Admitting: Family Medicine

## 2020-07-02 DIAGNOSIS — M799 Soft tissue disorder, unspecified: Secondary | ICD-10-CM

## 2020-07-11 DIAGNOSIS — Z23 Encounter for immunization: Secondary | ICD-10-CM | POA: Diagnosis not present

## 2020-07-18 ENCOUNTER — Ambulatory Visit (INDEPENDENT_AMBULATORY_CARE_PROVIDER_SITE_OTHER): Payer: Medicare Other

## 2020-07-18 ENCOUNTER — Other Ambulatory Visit: Payer: Self-pay

## 2020-07-18 DIAGNOSIS — I480 Paroxysmal atrial fibrillation: Secondary | ICD-10-CM | POA: Diagnosis not present

## 2020-07-18 DIAGNOSIS — Z5181 Encounter for therapeutic drug level monitoring: Secondary | ICD-10-CM | POA: Diagnosis not present

## 2020-07-18 LAB — POCT INR: INR: 3.4 — AB (ref 2.0–3.0)

## 2020-07-18 NOTE — Patient Instructions (Signed)
Take 0.5 tablet tonight only and then Continue taking Warfarin 1.5 tablets daily except 2 tablets on Mondays. Next INR in 4 weeks. Coumadin Clinic [phone]

## 2020-07-20 ENCOUNTER — Ambulatory Visit
Admission: RE | Admit: 2020-07-20 | Discharge: 2020-07-20 | Disposition: A | Discharge status: Home / Self Care | Payer: Medicare Other | Source: Ambulatory Visit | Attending: Family Medicine | Admitting: Family Medicine

## 2020-07-20 DIAGNOSIS — D333 Benign neoplasm of cranial nerves: Secondary | ICD-10-CM | POA: Diagnosis not present

## 2020-07-20 DIAGNOSIS — M799 Soft tissue disorder, unspecified: Secondary | ICD-10-CM

## 2020-07-20 DIAGNOSIS — E049 Nontoxic goiter, unspecified: Secondary | ICD-10-CM | POA: Diagnosis not present

## 2020-07-20 MED ORDER — IOPAMIDOL (ISOVUE-300) INJECTION 61%
75.0000 mL | Freq: Once | INTRAVENOUS | Status: AC | PRN
  Administered 2020-07-20: 75 mL via INTRAVENOUS

## 2020-07-23 ENCOUNTER — Ambulatory Visit (INDEPENDENT_AMBULATORY_CARE_PROVIDER_SITE_OTHER): Payer: Medicare Other

## 2020-07-23 DIAGNOSIS — I495 Sick sinus syndrome: Secondary | ICD-10-CM | POA: Diagnosis not present

## 2020-07-24 LAB — CUP PACEART REMOTE DEVICE CHECK
Battery Remaining Longevity: 119 mo
Battery Voltage: 3 V
Brady Statistic AP VP Percent: 0.6 %
Brady Statistic AP VS Percent: 96.63 %
Brady Statistic AS VP Percent: 0 %
Brady Statistic AS VS Percent: 2.77 %
Brady Statistic RA Percent Paced: 97.53 %
Brady Statistic RV Percent Paced: 0.6 %
Date Time Interrogation Session: 20220418052005
Implantable Lead Implant Date: 20180914
Implantable Lead Implant Date: 20180914
Implantable Lead Location: 753859
Implantable Lead Location: 753860
Implantable Lead Model: 5076
Implantable Lead Model: 5076
Implantable Pulse Generator Implant Date: 20180914
Lead Channel Impedance Value: 323 Ohm
Lead Channel Impedance Value: 399 Ohm
Lead Channel Impedance Value: 399 Ohm
Lead Channel Impedance Value: 494 Ohm
Lead Channel Pacing Threshold Amplitude: 0.5 V
Lead Channel Pacing Threshold Amplitude: 1 V
Lead Channel Pacing Threshold Pulse Width: 0.4 ms
Lead Channel Pacing Threshold Pulse Width: 0.4 ms
Lead Channel Sensing Intrinsic Amplitude: 12.375 mV
Lead Channel Sensing Intrinsic Amplitude: 12.375 mV
Lead Channel Sensing Intrinsic Amplitude: 2.625 mV
Lead Channel Sensing Intrinsic Amplitude: 2.625 mV
Lead Channel Setting Pacing Amplitude: 1.5 V
Lead Channel Setting Pacing Amplitude: 2.5 V
Lead Channel Setting Pacing Pulse Width: 0.4 ms
Lead Channel Setting Sensing Sensitivity: 0.9 mV

## 2020-08-07 DIAGNOSIS — F418 Other specified anxiety disorders: Secondary | ICD-10-CM | POA: Diagnosis not present

## 2020-08-07 DIAGNOSIS — M7989 Other specified soft tissue disorders: Secondary | ICD-10-CM | POA: Diagnosis not present

## 2020-08-08 NOTE — Progress Notes (Signed)
Remote pacemaker transmission.   

## 2020-08-10 DIAGNOSIS — R221 Localized swelling, mass and lump, neck: Secondary | ICD-10-CM | POA: Diagnosis not present

## 2020-08-13 ENCOUNTER — Other Ambulatory Visit: Payer: Self-pay

## 2020-08-13 ENCOUNTER — Ambulatory Visit (INDEPENDENT_AMBULATORY_CARE_PROVIDER_SITE_OTHER): Payer: Medicare Other

## 2020-08-13 DIAGNOSIS — I480 Paroxysmal atrial fibrillation: Secondary | ICD-10-CM | POA: Diagnosis not present

## 2020-08-13 DIAGNOSIS — Z5181 Encounter for therapeutic drug level monitoring: Secondary | ICD-10-CM

## 2020-08-13 LAB — POCT INR: INR: 3.4 — AB (ref 2.0–3.0)

## 2020-08-13 NOTE — Patient Instructions (Signed)
Decrease Warfarin to 1.5 tablets daily. Next INR in 4 weeks. Coumadin Clinic [phone]

## 2020-08-21 DIAGNOSIS — B079 Viral wart, unspecified: Secondary | ICD-10-CM | POA: Diagnosis not present

## 2020-08-21 DIAGNOSIS — Z85820 Personal history of malignant melanoma of skin: Secondary | ICD-10-CM | POA: Diagnosis not present

## 2020-08-21 DIAGNOSIS — L700 Acne vulgaris: Secondary | ICD-10-CM | POA: Diagnosis not present

## 2020-08-21 DIAGNOSIS — B351 Tinea unguium: Secondary | ICD-10-CM | POA: Diagnosis not present

## 2020-08-21 DIAGNOSIS — L738 Other specified follicular disorders: Secondary | ICD-10-CM | POA: Diagnosis not present

## 2020-08-21 DIAGNOSIS — L910 Hypertrophic scar: Secondary | ICD-10-CM | POA: Diagnosis not present

## 2020-08-21 DIAGNOSIS — L821 Other seborrheic keratosis: Secondary | ICD-10-CM | POA: Diagnosis not present

## 2020-08-21 DIAGNOSIS — L905 Scar conditions and fibrosis of skin: Secondary | ICD-10-CM | POA: Diagnosis not present

## 2020-08-21 DIAGNOSIS — D225 Melanocytic nevi of trunk: Secondary | ICD-10-CM | POA: Diagnosis not present

## 2020-08-21 DIAGNOSIS — L820 Inflamed seborrheic keratosis: Secondary | ICD-10-CM | POA: Diagnosis not present

## 2020-08-21 DIAGNOSIS — D485 Neoplasm of uncertain behavior of skin: Secondary | ICD-10-CM | POA: Diagnosis not present

## 2020-08-21 DIAGNOSIS — L814 Other melanin hyperpigmentation: Secondary | ICD-10-CM | POA: Diagnosis not present

## 2020-08-27 DIAGNOSIS — Z8679 Personal history of other diseases of the circulatory system: Secondary | ICD-10-CM | POA: Diagnosis not present

## 2020-08-27 DIAGNOSIS — R221 Localized swelling, mass and lump, neck: Secondary | ICD-10-CM | POA: Diagnosis not present

## 2020-08-27 DIAGNOSIS — R2231 Localized swelling, mass and lump, right upper limb: Secondary | ICD-10-CM | POA: Diagnosis not present

## 2020-08-27 DIAGNOSIS — R2242 Localized swelling, mass and lump, left lower limb: Secondary | ICD-10-CM | POA: Diagnosis not present

## 2020-08-27 DIAGNOSIS — Z7901 Long term (current) use of anticoagulants: Secondary | ICD-10-CM | POA: Diagnosis not present

## 2020-08-27 DIAGNOSIS — Z950 Presence of cardiac pacemaker: Secondary | ICD-10-CM | POA: Diagnosis not present

## 2020-08-27 DIAGNOSIS — I480 Paroxysmal atrial fibrillation: Secondary | ICD-10-CM | POA: Diagnosis not present

## 2020-08-29 ENCOUNTER — Telehealth: Payer: Self-pay | Admitting: Interventional Cardiology

## 2020-08-29 NOTE — Telephone Encounter (Signed)
PT IS CALLING IN REGARDS TO CLEARANCE FROM [facility], APPT [facility], CHECKED CHART TO SEE LAST VISIT

## 2020-09-04 NOTE — Telephone Encounter (Signed)
    [name] DOB:  1945/09/12  MRN:  [id number]   Primary Cardiologist: Sinclair Grooms, MD  Chart reviewed as part of pre-operative protocol coverage. I do not see a formal preop request.  Callback: - Please clarify procedure details with the requesting office and generate a formal request. Thank you!   Abigail Butts, PA-C 09/04/2020, 8:52 AM

## 2020-09-04 NOTE — Telephone Encounter (Signed)
I called the pt and asked the pt specifically who is the doctor and practice who will be performing her surgery/procedure. Pt states Dr. Gwyndolyn Saxon Ward with [facility]. I called [phone] and s/w receptionist who states she will get a message to Dr. Guido Sander nurse to call our office with procedure information. Per receptionist; pt will be having an x-ray, MRI of chest and abdomen, surgery for lipoma, will need to hold coumadin, pt has PPM as well and will need clearance for PPM also. I will await call with complete information before sending to the pre op pool. Surgery/procedure set for 09/20/20.    RIGHT LATERAL NECK/SHOULDER, RIGHT VOLAR FOREARM AND POSSIBLE LEFT CALF MASS RESECTION

## 2020-09-04 NOTE — Telephone Encounter (Signed)
Sam from [facility] is f/u on [employer] Clearance Forms that were faxed 08/28/20 for Dr. Tamala Julian to complete. Please advise Sams's phone# [phone] Sam's fax# is [phone]

## 2020-09-04 NOTE — Telephone Encounter (Signed)
I tried to reach out to [name] with [facility] scheduling again, though was on hold for 20 minutes with no one picking up at [facility].

## 2020-09-04 NOTE — Telephone Encounter (Signed)
I tried to call [name] at [facility] [phone]. No answer, recording came on and ask if you want their office to call you back to enter your phone # I entered my phone # [phone] for a callback from [place] with [place]. Will need to have clearance request faxed to our office [phone].

## 2020-09-05 NOTE — Telephone Encounter (Signed)
Kendall called back from [facility], she is stating that Dr. Leonides Schanz wants pt to be cleared to have MRI's since pt has a PPM.  I advised Lorre Nick that they needed to send over a form for [place] to get over to the [facility].   I also have advised her that if they need a general surgical clearance that they needed to get that information over since the pt's surgery is coming up after the testing, so we can get it completed.  She thanked me for taking her call.

## 2020-09-05 NOTE — Telephone Encounter (Signed)
Dr. Gabriel Carina was returning phone call. Please advise ask for

## 2020-09-05 NOTE — Telephone Encounter (Signed)
Returned call back and left a message for Dr. Guido Sander RN to call back.

## 2020-09-05 NOTE — Telephone Encounter (Signed)
Desiree from [employer] was returning a call regarding this patient's surgical clearance

## 2020-09-05 NOTE — Telephone Encounter (Signed)
Returned call to [employer], [place].  Was on hold for 10 mins and no answer.  Will try again.

## 2020-09-06 ENCOUNTER — Telehealth: Payer: Self-pay | Admitting: Interventional Cardiology

## 2020-09-06 NOTE — Telephone Encounter (Signed)
I did try to reach [facility] with [facility] in regards to clarification if needing cardiac clearance along with device clearance. I have yet to see a clearance request be sent to our office asking for cardiac clearance. See notes from yesterday 6/1.

## 2020-09-06 NOTE — Telephone Encounter (Signed)
Sam from [place] [place] is calling back to see if our office has received clearance form in regards to pt's PPM. I will send this note to our device clinic to review phone note from 08/29/20 and today's phone note.

## 2020-09-06 NOTE — Telephone Encounter (Signed)
New message:     Sam calling from [facility] to check to see if we have received the fax.

## 2020-09-06 NOTE — Telephone Encounter (Signed)
Called and provided Device Clinic fax #  [phone]) to [facility] scheduling she was messaging Sam and the call dropped. Called Sam back directly at [phone] and got voicemail. Provided Device clinic fax # and direct # to device clinic on voicemail.

## 2020-09-06 NOTE — Telephone Encounter (Signed)
I spoke with the Sam from [place] and gave him device clinic fax number. He is faxing the form. I told him once I receive the form I will give it to our triage nurse and have her fax it back.

## 2020-09-10 ENCOUNTER — Telehealth: Payer: Self-pay | Admitting: Emergency Medicine

## 2020-09-10 NOTE — Telephone Encounter (Signed)
Received call requesting MRI Clearance form. Informed that MRI form was faxed to Sam at [facility] on 09/06/20 with a verification of fax. Confirmed fax # is [phone]. Asked to refax form to [phone] and [phone].

## 2020-09-10 NOTE — Telephone Encounter (Signed)
Pt called to check on the status of preop clearance. After reading over previous documentation in this telephone note I advised pt it appears preop clearance has still not been received. I also advised I'm unsure why schedulers have not been entering preop clearance into the system directly due to faxes not being received when the requesting office calls in to avoid playing phone tag. Requested pt have requesting office call in so the questions needed for clearance can be answered over the phone via scheduler who takes call.

## 2020-09-12 ENCOUNTER — Telehealth: Payer: Self-pay

## 2020-09-12 NOTE — Telephone Encounter (Signed)
I spoke to [facility] RN and informed her that we need surgical clearance form  faxed to [place] prior to her 6/16 surgery date.  They will follow through.

## 2020-09-12 NOTE — Telephone Encounter (Signed)
lpmtcb to further discuss upcoming 6/16 surgery.

## 2020-09-13 ENCOUNTER — Telehealth: Payer: Self-pay | Admitting: *Deleted

## 2020-09-13 NOTE — Telephone Encounter (Signed)
Attempted to contact patient as part of the preoperative cardiac protocol.  Left message to call back at [phone] on 09/13/2020.  Patient is call back.

## 2020-09-13 NOTE — Telephone Encounter (Signed)
Patient with diagnosis of afib on warfarin for anticoagulation.    Procedure: Right Lateral neck/shoulder, right volar forearm and possible left calf mass resection Date of procedure: 09/23/20  CHA2DS2-VASc Score = 6  This indicates a 9.7% annual risk of stroke. The patient's score is based upon: CHF History: No HTN History: Yes Diabetes History: No Stroke History: Yes Vascular Disease History: No Age Score: 2 Gender Score: 1   CrCl 34mL/min Platelet count 280K  Per office protocol, patient can hold warfarin for 5 days prior to procedure. Patient will need bridging with Lovenox (enoxaparin) around procedure. INRs are followed at [facility] office Coumadin clinic - Lovenox bridge will be coordinated at pt's next appt on 6/10.

## 2020-09-13 NOTE — Telephone Encounter (Signed)
   [facility] Pre-operative Risk Assessment    Patient Name: [name]  DOB: April 27, 1945  MRN: [id number]   HEARTCARE STAFF: - Please ensure there is not already an duplicate clearance open for this procedure. - Under Visit Info/Reason for Call, type in Other and utilize the format Clearance MM/DD/YY or Clearance TBD. Do not use dashes or single digits. - If request is for dental extraction, please clarify the # of teeth to be extracted. - If the patient is currently at the dentist's office, call Pre-Op APP to address. If the patient is not currently in the dentist office, please route to the Pre-Op pool  Request for surgical clearance:  What type of surgery is being performed? Right Lateral neck/shoulder, right volar forearm and possible left calf mass resection.   When is this surgery scheduled? 09/23/2020   What type of clearance is required (medical clearance vs. Pharmacy clearance to hold med vs. Both)? Both  Are there any medications that need to be held prior to surgery and how long?Warfarin         Practice name and name of physician performing surgery? [facility] is the office phone number? [phone]   7.   What is the office fax number?        [phone]  8.   Anesthesia type (None, local, MAC, general) ? [name] 09/13/2020, 1:52 PM  _________________________________________________________________   (provider comments below)

## 2020-09-13 NOTE — Telephone Encounter (Signed)
[facility] called and stated that will send a surgical clearance by fax to [place] at [phone].

## 2020-09-14 ENCOUNTER — Other Ambulatory Visit: Payer: Self-pay

## 2020-09-14 ENCOUNTER — Ambulatory Visit (INDEPENDENT_AMBULATORY_CARE_PROVIDER_SITE_OTHER): Payer: Medicare Other

## 2020-09-14 DIAGNOSIS — Z5181 Encounter for therapeutic drug level monitoring: Secondary | ICD-10-CM | POA: Diagnosis not present

## 2020-09-14 DIAGNOSIS — I480 Paroxysmal atrial fibrillation: Secondary | ICD-10-CM

## 2020-09-14 LAB — POCT INR: INR: 3.2 — AB (ref 2.0–3.0)

## 2020-09-14 MED ORDER — ENOXAPARIN SODIUM 60 MG/0.6ML IJ SOSY: 60.0000 mg | PREFILLED_SYRINGE | Freq: Two times a day (BID) | INTRAMUSCULAR | 1 refills | Status: DC

## 2020-09-14 NOTE — Patient Instructions (Signed)
Take 1 tablet today only and then Continue taking 1.5 tablets daily. Next INR in 2 weeks. Coumadin Clinic [phone], Legrand Como  6/10: Last dose of warfarin.  6/11: No warfarin or enoxaparin (Lovenox).  6/12: Inject enoxaparin 60 mg in the fatty abdominal tissue at least 2 inches from the belly button twice a day about 12 hours apart, 8am and 8pm rotate sites. No warfarin.  6/13: Inject enoxaparin in the fatty tissue every 12 hours, 8am and 8pm. No warfarin.  6/14: Inject enoxaparin in the fatty tissue every 12 hours, 8am and 8pm. No warfarin.  6/15: Inject enoxaparin in the fatty tissue in the morning at 8 am (No PM dose). No warfarin.  6/16: Procedure Day - No enoxaparin - Resume warfarin in the evening or as directed by doctor (take an extra half tablet with usual dose for 2 days then resume normal dose).  6/17: Resume enoxaparin inject in the fatty tissue every 12 hours and take warfarin  6/18: Inject enoxaparin in the fatty tissue every 12 hours and take warfarin  6/19: Inject enoxaparin in the fatty tissue every 12 hours and take warfarin  6/20: Inject enoxaparin in the fatty tissue every 12 hours and take warfarin  6/21: Inject enoxaparin in the fatty tissue every 12 hours and take warfarin  Continue Warfarin  6/24: warfarin appt to check INR.   IF PROCEDURE CANCELLED:   6/10: Last dose of warfarin.  6/11: No warfarin or enoxaparin (Lovenox).  6/12: Inject enoxaparin 60 mg in the fatty abdominal tissue at least 2 inches from the belly button twice a day about 12 hours apart, 8am and 8pm rotate sites. No warfarin.  6/13: Inject enoxaparin 8 am/pm and warfarin (2 tablets).  6/14: Resume warfarin.

## 2020-09-17 ENCOUNTER — Telehealth: Payer: Self-pay

## 2020-09-17 DIAGNOSIS — R2242 Localized swelling, mass and lump, left lower limb: Secondary | ICD-10-CM | POA: Diagnosis not present

## 2020-09-17 NOTE — Telephone Encounter (Signed)
Lpmtcb regarding procedure 6/16.  Told to call, if questions regarding Lovenox Bridging.

## 2020-09-18 ENCOUNTER — Telehealth: Payer: Self-pay | Admitting: Interventional Cardiology

## 2020-09-18 DIAGNOSIS — R221 Localized swelling, mass and lump, neck: Secondary | ICD-10-CM | POA: Diagnosis not present

## 2020-09-18 NOTE — Telephone Encounter (Signed)
Spoke with pt and scheduled her to come in and see Terie Purser, NP on 6/23.  Pt agreeable to plan.

## 2020-09-18 NOTE — Telephone Encounter (Signed)
Left message to call back  

## 2020-09-18 NOTE — Telephone Encounter (Signed)
Per patient schedule message, patient is requesting an appointment ASAP:  "A CT scan preformed for the removal of a mass on my shoulder, showed a 4.cm ascending thoracic aorta aneurysm.  I would like to schedule an appointment , as soon as possible, to discuss this with you  Thank you"

## 2020-09-19 ENCOUNTER — Telehealth: Payer: Self-pay

## 2020-09-19 NOTE — Telephone Encounter (Signed)
Routing to [employer] as he has been working with this pt to make him aware and to see if it would be possible to make coumadin visit at [facility] same day as when she sees ms. Walker pa-c on 09/27/20

## 2020-09-19 NOTE — Telephone Encounter (Signed)
Received a msg on the [facility] phone regarding procedure and changing appt to match up with the time that she sees caitlin walker. I returned a call to the pt and am awaiting a call back so we can go over the bridging process and possibly changing the appt. Will await a callback .

## 2020-09-19 NOTE — Telephone Encounter (Signed)
I spoke to the patient and rescheduled her INR check post Lovenox Bridge to 6/23 @ 10:45.  She sees Laurann Montana @ 11:15.

## 2020-09-20 DIAGNOSIS — R011 Cardiac murmur, unspecified: Secondary | ICD-10-CM | POA: Diagnosis not present

## 2020-09-20 DIAGNOSIS — Z950 Presence of cardiac pacemaker: Secondary | ICD-10-CM | POA: Diagnosis not present

## 2020-09-20 DIAGNOSIS — Z9841 Cataract extraction status, right eye: Secondary | ICD-10-CM | POA: Diagnosis not present

## 2020-09-20 DIAGNOSIS — Z862 Personal history of diseases of the blood and blood-forming organs and certain disorders involving the immune mechanism: Secondary | ICD-10-CM | POA: Diagnosis not present

## 2020-09-20 DIAGNOSIS — Z961 Presence of intraocular lens: Secondary | ICD-10-CM | POA: Diagnosis not present

## 2020-09-20 DIAGNOSIS — G43909 Migraine, unspecified, not intractable, without status migrainosus: Secondary | ICD-10-CM | POA: Diagnosis not present

## 2020-09-20 DIAGNOSIS — K219 Gastro-esophageal reflux disease without esophagitis: Secondary | ICD-10-CM | POA: Diagnosis not present

## 2020-09-20 DIAGNOSIS — I10 Essential (primary) hypertension: Secondary | ICD-10-CM | POA: Diagnosis not present

## 2020-09-20 DIAGNOSIS — K449 Diaphragmatic hernia without obstruction or gangrene: Secondary | ICD-10-CM | POA: Diagnosis not present

## 2020-09-20 DIAGNOSIS — I495 Sick sinus syndrome: Secondary | ICD-10-CM | POA: Diagnosis not present

## 2020-09-20 DIAGNOSIS — Z79899 Other long term (current) drug therapy: Secondary | ICD-10-CM | POA: Diagnosis not present

## 2020-09-20 DIAGNOSIS — Z90710 Acquired absence of both cervix and uterus: Secondary | ICD-10-CM | POA: Diagnosis not present

## 2020-09-20 DIAGNOSIS — I340 Nonrheumatic mitral (valve) insufficiency: Secondary | ICD-10-CM | POA: Diagnosis not present

## 2020-09-20 DIAGNOSIS — Z8673 Personal history of transient ischemic attack (TIA), and cerebral infarction without residual deficits: Secondary | ICD-10-CM | POA: Diagnosis not present

## 2020-09-20 DIAGNOSIS — D170 Benign lipomatous neoplasm of skin and subcutaneous tissue of head, face and neck: Secondary | ICD-10-CM | POA: Diagnosis not present

## 2020-09-20 DIAGNOSIS — Z7901 Long term (current) use of anticoagulants: Secondary | ICD-10-CM | POA: Diagnosis not present

## 2020-09-20 DIAGNOSIS — R2242 Localized swelling, mass and lump, left lower limb: Secondary | ICD-10-CM | POA: Diagnosis not present

## 2020-09-20 DIAGNOSIS — D1721 Benign lipomatous neoplasm of skin and subcutaneous tissue of right arm: Secondary | ICD-10-CM | POA: Diagnosis not present

## 2020-09-20 DIAGNOSIS — Z9842 Cataract extraction status, left eye: Secondary | ICD-10-CM | POA: Diagnosis not present

## 2020-09-20 DIAGNOSIS — J449 Chronic obstructive pulmonary disease, unspecified: Secondary | ICD-10-CM | POA: Diagnosis not present

## 2020-09-20 DIAGNOSIS — Z85828 Personal history of other malignant neoplasm of skin: Secondary | ICD-10-CM | POA: Diagnosis not present

## 2020-09-20 DIAGNOSIS — E039 Hypothyroidism, unspecified: Secondary | ICD-10-CM | POA: Diagnosis not present

## 2020-09-20 DIAGNOSIS — Z9089 Acquired absence of other organs: Secondary | ICD-10-CM | POA: Diagnosis not present

## 2020-09-20 DIAGNOSIS — Z9049 Acquired absence of other specified parts of digestive tract: Secondary | ICD-10-CM | POA: Diagnosis not present

## 2020-09-20 DIAGNOSIS — R221 Localized swelling, mass and lump, neck: Secondary | ICD-10-CM | POA: Diagnosis not present

## 2020-09-20 DIAGNOSIS — R2231 Localized swelling, mass and lump, right upper limb: Secondary | ICD-10-CM | POA: Diagnosis not present

## 2020-09-20 DIAGNOSIS — Z79890 Hormone replacement therapy (postmenopausal): Secondary | ICD-10-CM | POA: Diagnosis not present

## 2020-09-20 DIAGNOSIS — I4891 Unspecified atrial fibrillation: Secondary | ICD-10-CM | POA: Diagnosis not present

## 2020-09-24 ENCOUNTER — Telehealth: Payer: Self-pay | Admitting: Family

## 2020-09-24 NOTE — Telephone Encounter (Signed)
Left message for patient to call back  

## 2020-09-24 NOTE — Telephone Encounter (Signed)
New message:     Patient would like know if she can get a order also for labs.

## 2020-09-27 ENCOUNTER — Ambulatory Visit (INDEPENDENT_AMBULATORY_CARE_PROVIDER_SITE_OTHER): Payer: Medicare Other | Admitting: *Deleted

## 2020-09-27 ENCOUNTER — Ambulatory Visit (INDEPENDENT_AMBULATORY_CARE_PROVIDER_SITE_OTHER): Payer: Medicare Other | Admitting: Family

## 2020-09-27 ENCOUNTER — Encounter: Payer: Self-pay | Admitting: Family

## 2020-09-27 ENCOUNTER — Other Ambulatory Visit: Payer: Self-pay

## 2020-09-27 VITALS — BP 110/70 | HR 64 | Ht 65.0 in | Wt 149.0 lb

## 2020-09-27 DIAGNOSIS — Z5181 Encounter for therapeutic drug level monitoring: Secondary | ICD-10-CM

## 2020-09-27 DIAGNOSIS — Z950 Presence of cardiac pacemaker: Secondary | ICD-10-CM | POA: Diagnosis not present

## 2020-09-27 DIAGNOSIS — K219 Gastro-esophageal reflux disease without esophagitis: Secondary | ICD-10-CM | POA: Diagnosis not present

## 2020-09-27 DIAGNOSIS — I712 Thoracic aortic aneurysm, without rupture, unspecified: Secondary | ICD-10-CM

## 2020-09-27 DIAGNOSIS — I480 Paroxysmal atrial fibrillation: Secondary | ICD-10-CM | POA: Diagnosis not present

## 2020-09-27 DIAGNOSIS — I10 Essential (primary) hypertension: Secondary | ICD-10-CM

## 2020-09-27 DIAGNOSIS — E785 Hyperlipidemia, unspecified: Secondary | ICD-10-CM

## 2020-09-27 DIAGNOSIS — Z7901 Long term (current) use of anticoagulants: Secondary | ICD-10-CM

## 2020-09-27 DIAGNOSIS — K449 Diaphragmatic hernia without obstruction or gangrene: Secondary | ICD-10-CM | POA: Diagnosis not present

## 2020-09-27 LAB — POCT INR: INR: 1.7 — AB (ref 2.0–3.0)

## 2020-09-27 NOTE — Telephone Encounter (Signed)
Pt has never called back as requested.  She has an appt today with Laurann Montana, NP who will order any lab necessary at that time.

## 2020-09-27 NOTE — Progress Notes (Signed)
Office Visit    Patient Name: Buena Boehm Date of Encounter: 09/27/2020  PCP:  Aura Dials, MD   [facility]  Cardiologist:  Sinclair Grooms, MD  Advanced Practice Provider:  No care team member to display Electrophysiologist:  Thompson Grayer, MD   Chief Complaint    [name] is a 75 y.o. female with a hx of ascending thoracic aortic aneurysm measuring 4 mm by CT 09/2020, anxiety, COPD, deaf in the right ear, stroke, hypertension, hyperlipidemia, hypothyroidism, atrial fibrillation on chronic anticoagulation, tachybradycardia s/p PPM presents today for follow-up with thoracic aortic aneurysm.  Past Medical History    Past Medical History:  Diagnosis Date   Acoustic neuroma ([place])    Hx of right ear acoustic neuroma, removed in 1999. No hearing in right ear.   Anxiety    Hx of, responds to Wellbutrin   Basal cell carcinoma    s/p MOHS surgery in 03/2011   Cataract    Chronic anticoagulation 11/24/2016   Chronic cholecystitis with calculus 05/13/2013   Closed fracture of left lateral malleolus 08/10/2017   COPD (chronic obstructive pulmonary disease) (HCC)    Deafness in right ear    Embolic stroke ([place]) 1/76/1607   Family history of adverse reaction to anesthesia    Family hx of PONV   Fracture    left lateral malleolus fracture   GERD (gastroesophageal reflux disease)    Headache(784.0)    MIGRAINES   Hearing loss    Only in right ear, from an acoustic neuroma. S/P removal in 1999.    History of hiatal hernia    History of neck pain    Responds to Flexeril   History of shingles    Hypercholesteremia    Hypertension    Hypothyroidism    Melanoma ([place]) 2018   right arm-surgery only   PAF (paroxysmal atrial fibrillation) ([place])    2 brief episodes of Afib recorded on monitor 02/2007   Paroxysmal atrial fibrillation (HCC)    PONV (postoperative nausea and vomiting)    Presence of permanent cardiac pacemaker 2018    for A-fib   Sick sinus syndrome ([place]) 12/19/2016   Sinus bradycardia    Tachycardia-bradycardia syndrome ([place]) 11/24/2016   TIA (transient ischemic attack) 2011   On coumadin. Asymptomatic, without recurrence.   Past Surgical History:  Procedure Laterality Date   ABDOMINAL HYSTERECTOMY     APPENDECTOMY     BREAST BIOPSY     X 3   BREAST CYST ASPIRATION Left 50 yrs ago per pt   BREAST EXCISIONAL BIOPSY Left 50 yrs ago   BREAST LUMPECTOMY WITH RADIOACTIVE SEED LOCALIZATION Left 06/21/2019   Procedure: LEFT BREAST LUMPECTOMY WITH RADIOACTIVE SEED LOCALIZATION;  Surgeon: Donnie Mesa, MD;  Location: [place];  Service: General;  Laterality: Left;   CHOLECYSTECTOMY N/A 05/26/2013   Procedure: LAPAROSCOPIC CHOLECYSTECTOMY WITH INTRAOPERATIVE CHOLANGIOGRAM;  Surgeon: Imogene Burn. Georgette Dover, MD;  Location: [place];  Service: General;  Laterality: N/A;   CRANIECTOMY FOR EXCISION OF ACOUSTIC NEUROMA Right 1999   INTRAOCULAR LENS INSERTION     Dr. Talbert Forest   MOHS SURGERY  03/2011   Basal cell skin cancer   ORIF ANKLE FRACTURE Left 08/10/2017   Procedure: OPEN REDUCTION INTERNAL FIXATION (ORIF) LEFT ANKLE FRACTURE;  Surgeon: Rod Can, MD;  Location: [place];  Service: Orthopedics;  Laterality: Left;   PACEMAKER IMPLANT N/A 12/19/2016   Medtronic Azure XT MRI conditional dual-chamber pacemaker for symptomatic sinus  bradycardia by Dr Rayann Heman   TONSILLECTOMY      Allergies  Allergies  Allergen Reactions   Atorvastatin Other (See Comments)    MUSCLE WEAKNESS   Statins Other (See Comments)    MUSCLE WEAKNESS [CLASS EFFECT]   Ezetimibe     Muscle pain, trouble going to the bathroom.    Codeine Nausea Only   Latex Rash   Penicillins Rash and Other (See Comments)    Has patient had a PCN reaction causing immediate rash, facial/tongue/throat swelling, SOB or lightheadedness with hypotension: Rash in hands  Has patient had a PCN reaction causing severe rash involving mucus membranes or  skin necrosis: no Has patient had a PCN reaction that required hospitalization No Has patient had a PCN reaction occurring within the last 10 years: No  If all of the above answers are "NO", then may proceed with Cephalosporin use.     History of Present Illness    [name] is a 75 y.o. female with a hx of ascending thoracic aortic aneurysm measuring 4 mm by CT 09/2020, anxiety, COPD, deaf in the right ear, stroke, hypertension, hyperlipidemia, hypothyroidism, atrial fibrillation on chronic anticoagulation, tachybradycardia s/p PPM (MDT 2018) last seen by Tommye Standard, PA 05/28/2020.  She was seen by Dr. Tamala Julian October 2021 noting increased personal stressors associated with palpitations.  She was in atrial fibrillation on arrival to spontaneously converted during her office visit.  Her metoprolol was transitioned to 12.5 mg daily.  Labs 03/2020 at primary care provider total cholesterol 190, HDL 51, LDL 100, triglycerides 184. Repatha was started 04/30/20.   She was seen in follow-up 05/2020 noting palpitations were well controlled.  She reported no chest pain but noted dyspnea on exertion.  Subsequent echocardiogram 06/26/2019 LVEF 55 to 60%, mild asymmetric LVH of basal and septal segment, indeterminate diastolic parameters, LV global longitudinal strain -17.4, RV SF normal, normal PASP, LA mildly dilated, moderate MR, moderate calcification of aortic valve with mild AI and no aortic stenosis.  Lab work 08/26/2020 ALT 23, AST 23, creatinine 0.94, hemoglobin 13.5  She was evaluated 09/2020 at [facility] due to evaluation of 3 lesions. 5cm lipomatous mass at right neck, small subcutaneous lipoma right forearm, lipomatous lesion left mid calf. She underwent surgical resection as an outpatient procedure. CT 09/18/20 at [facility] showed 62mm aneurysm of ascending thoracic aorta.   She presents today for follow-up with her husband. We reviewed her CT results in detail.  Long discussion  regarding management of thoracic aortic aneurysm, indications for repair, continue monitoring, blood pressure control, lipid control.  Reports no chest pain, pressure, tightness.  Reports no shortness of breath at rest nor dyspnea on exertion.  Reports no edema, orthopnea, PND.  Notes her palpitations have increased in frequency.  She is still taking Toprol 12.5 mg daily.  She attributes her increasing palpitations to increase stress in the setting of recent surgery, worrying about this thoracic aortic aneurysm understandably.  She endorses compliance with her Repatha though does note that the skin will be slightly irritated often until her next dose.  EKGs/Labs/Other Studies Reviewed:   The following studies were reviewed today:  CT Chest 09/17/20  #  LUNGS:  #  Mild dependent atelectasis or edema.  #  No consolidation, effusion, pneumothorax or concerning lung nodules.   #  MEDIASTINUM:  #  4 cm aneurysm of the ascending thoracic aorta measured at the level of the main right pulmonary artery image 60 series 2, series 5.  #  28 mm diameter descending thoracic aorta on the same image. No evidence of acute vascular pathology.  #  Heart size is normal.  #  There are no enlarged thoracic lymph nodes.  #  Patent central airways.   #  ABDOMEN:  #  No acute or relevant pathology in the visible abdominal structures.   #  MSK:  #  No acute or aggressive bone lesions.   IMPRESSION:  1. No evidence of metastatic disease.  2. 4 cm aneurysm of the ascending thoracic aorta.   CT Tibia Fibula Left 09/17/20 The patient returned and a BB was placed at the region of the patient's  palpable abnormality. There is a small curvilinear focus of fat  attenuation within this region within subcutaneous fat directly adjacent  to the anterior compartment musculature. This measures approximately 10 x  3 x 17 mm. No nodular components or thickened septa.   IMPRESSION: Likely small lipoma corresponding to the  patient's lesion.   CT soft tissue neck 09/18/20  FINDINGS:  # Intracranial/skull base: Visualized intracranial structures are unremarkable. Right suboccipital craniotomy with cranioplasty.  #  Orbits: Normal.  #  Paranasal sinuses/mastoid: Paranasal sinuses are clear. Mastoid air cells clear. Middle ear structures normal.   #  Salivary glands: Parotid and submandibular glands are normal.  #  Pharynx/oral cavity: The nasopharynx, oropharynx, and hypopharynx demonstrate no significant abnormality.  #  Larynx: Larynx unremarkable.   #  Thyroid gland: Unremarkable.  #  Lymph nodes: No significant lymphadenopathy. Skin marker overlies right posterior inferior neck and superior shoulder region. There is no mass, fluid collection, or abnormal lymphadenopathy.  #  Vasculature: Vascular structures unremarkable.  #  Bones: No destructive bony lesions. Mild cervical degenerative disc disease.  #  Additional comments: Lung apices are clear.    IMPRESSION:   Skin marker overlies right posterior inferior neck and superior shoulder region. There is no mass, fluid collection, or abnormal lymphadenopathy.   Echo 06/25/20  1. Left ventricular ejection fraction, by estimation, is 55 to 60%. The  left ventricle has normal function. The left ventricle has no regional  wall motion abnormalities. There is mild asymmetric left ventricular  hypertrophy of the basal and septal  segments. Left ventricular diastolic parameters are indeterminate. The  average left ventricular global longitudinal strain is -17.4 %.   2. Pacing wires in RA/RV . Right ventricular systolic function is normal.  The right ventricular size is normal. There is normal pulmonary artery  systolic pressure.   3. Left atrial size was moderately dilated.   4. The mitral valve is normal in structure. Moderate mitral valve  regurgitation. No evidence of mitral stenosis.   5. The aortic valve was not well visualized. There is moderate   calcification of the aortic valve. There is moderate thickening of the  aortic valve. Aortic valve regurgitation is mild. Mild to moderate aortic  valve sclerosis/calcification is present,  without any evidence of aortic stenosis.   6. The inferior vena cava is normal in size with greater than 50%  respiratory variability, suggesting right atrial pressure of 3 mmHg.   12/11/2016: stress myoview Clinically and electrically negative for ischemia Normal perfusion. No ischemia or scar This is a low risk study. LVEF is 62%     12/02/2016: TTE Study Conclusions  - Left ventricle: The cavity size was normal. Wall thickness was    normal. Systolic function was normal. The estimated ejection    fraction was in the range of 55% to  60%. Wall motion was normal;    there were no regional wall motion abnormalities. Features are    consistent with a pseudonormal left ventricular filling pattern,    with concomitant abnormal relaxation and increased filling    pressure (grade 2 diastolic dysfunction).  - Aortic valve: There was no stenosis.  - Mitral valve: Mildly calcified annulus. There was mild    regurgitation.  - Left atrium: The atrium was mildly to moderately dilated.  - Right ventricle: The cavity size was normal. Systolic function    was normal.  - Right atrium: The atrium was mildly dilated.  - Tricuspid valve: Peak RV-RA gradient (S): 27 mm Hg.  - Pulmonary arteries: PA peak pressure: 30 mm Hg (S).  - Inferior vena cava: The vessel was normal in size. The    respirophasic diameter changes were in the normal range (>= 50%),    consistent with normal central venous pressure.   Impressions:  - Normal LV size with EF 55-60%. Moderate diastolic dysfunction.    Normal RV size and systolic function. Mild mitral regurgitation.    EKG:  No EKG today.  Recent Labs: 11/03/2019: ALT 13  Recent Lipid Panel    Component Value Date/Time   CHOL 316 (H) 11/03/2019 1030   TRIG 165 (H)  11/03/2019 1030   HDL 51 11/03/2019 1030   CHOLHDL 6.2 (H) 11/03/2019 1030   CHOLHDL 4 12/26/2013 0824   VLDL 44.8 (H) 12/26/2013 0824   LDLCALC 233 (H) 11/03/2019 1030   LDLDIRECT 106.6 12/26/2013 0824   Home Medications   Current Meds  Medication Sig   Cholecalciferol (VITAMIN D3 PO) Take by mouth.   clonazePAM (KLONOPIN) 0.5 MG tablet Take 0.5 mg by mouth daily.   enoxaparin (LOVENOX) 60 MG/0.6ML injection Inject 0.6 mLs (60 mg total) into the skin every 12 (twelve) hours.   fluticasone (FLONASE) 50 MCG/ACT nasal spray Place 1 spray into both nostrils daily.   JUBLIA 10 % SOLN apply to affected nails qd   levothyroxine (SYNTHROID, LEVOTHROID) 88 MCG tablet Take 88 mcg by mouth daily before breakfast.   loratadine (CLARITIN) 10 MG tablet Take 10 mg by mouth daily as needed for allergies.   metoprolol succinate (TOPROL XL) 25 MG 24 hr tablet Take 0.5 tablets (12.5 mg total) by mouth daily.   pantoprazole (PROTONIX) 40 MG tablet Take 40 mg by mouth daily as needed (acid reflux).   REPATHA SURECLICK 097 MG/ML SOAJ INJECT CONTENTS OF 1 PEN SUBCUTANEOUSLY EVERY 14 DAYS.   rosuvastatin (CRESTOR) 5 MG tablet Take 1 tablet by mouth as directed 1-3 times per week   vitamin B-12 (CYANOCOBALAMIN) 1000 MCG tablet 2 tablets   warfarin (COUMADIN) 2.5 MG tablet TAKE 1 AND 1/2-2 TABLETS BY MOUTH DAILY AS DIRECTED BY COUMADIN CLINIC.     Review of Systems    All other systems reviewed and are otherwise negative except as noted above.  Physical Exam    VS:  BP 110/70 (BP Location: Left Arm, Patient Position: Sitting, Cuff Size: Normal)   Pulse 64   Ht 5\' 5"  (1.651 m)   Wt 149 lb (67.6 kg)   SpO2 96%   BMI 24.79 kg/m  , BMI Body mass index is 24.79 kg/m.  Wt Readings from Last 3 Encounters:  09/27/20 149 lb (67.6 kg)  05/28/20 152 lb 6.4 oz (69.1 kg)  01/09/20 147 lb (66.7 kg)    GEN: Well nourished, well developed, in no acute distress. HEENT: normal. Neck: Supple, no JVD,  carotid  bruits, or masses. Cardiac: RRR, no murmurs, rubs, or gallops. No clubbing, cyanosis, edema.  Radials/PT 2+ and equal bilaterally.  Respiratory:  Respirations regular and unlabored, clear to auscultation bilaterally. GI: Soft, nontender, nondistended. MS: No deformity or atrophy. Skin: Warm and dry, no rash. Neuro:  Strength and sensation are intact. Psych: Normal affect.  Assessment & Plan    HLD, LDL goal <70 - 10/2019 LDL 233. 03/2020 LDL 100. Repatha initiated. LDL goal <70, ideally <55 in setting of thoracic aortic aneurysm. CMP, lipid panel today. Continue Repatha, Rosuvastatin 1-3 times per week. If LDL not at goal of <70, plan to refer to lipid clinic.   Thoracic aortic aneurysm - 4cm by CT 09/2020. Of note, previous CT 2017 with measurement of 3.9cm. Discussed often no indication for repair until 5.5cm. Echocardiogram 06/25/20 LVEF 55-3%, no R WMA, mild asymmetric LVH of the basal and septal segments, indeterminate diastolic parameters, moderate MR, moderate calcification of aortic valve with mild regurgitation and no stenosis.  Consider repeat echocardiogram 06/2021 for monitoring..  Lipid management, as above. Continue optimal BP control. Educated to avoid fluoroquinolones. Reassurance provided. Recent echo 06/2020 with no significant abnormalities. Will refer to cardiothoracic surgery for continued monitoring.   PAF / Chronic anticoagulation -maintaining NSR by auscultation today.  Reports occasional palpitations which have worsened in the setting of recent stressors.  We discussed possibly increasing her dose of metoprolol but she prefers to remain on present 12.5 mg dosing and will contact her office if she feels increased dose as needed.  Continue to follow with Coumadin clinic. Denies bleeding complications.  Hypothyroidism - Continue to follow with PCP.  GERD / Hiatal hernia - Continue to follow with PCP. Continue Protonix 40mg  QD.   HTN - BP well controlled. Continue current  antihypertensive regimen of Toprol 12.5mg  daily.   S/p PPM - Continue to follow with EP.   Disposition: Follow up in 6 month(s) with Dr. Tamala Julian   Signed, Loel Dubonnet, NP 09/27/2020, 1:24 PM [place]

## 2020-09-27 NOTE — Patient Instructions (Addendum)
Description   Continue Lovenox injections (3 injections left). Today and tomorrow take 2 tablets of Warfarin then continue taking 1.5 tablets daily. Next INR in 1 week. Coumadin Clinic [phone]

## 2020-09-27 NOTE — Patient Instructions (Addendum)
Medication Instructions:  Continue your current medications.   *If you need a refill on your cardiac medications before your next appointment, please call your pharmacy*   Lab Work: Your provider recommends that you return for lab work today: CMP, direct LDL  If you have labs (blood work) drawn today and your tests are completely normal, you will receive your results only by: MyChart Message (if you have MyChart) OR A paper copy in the mail If you have any lab test that is abnormal or we need to change your treatment, we will call you to review the results.   Testing/Procedures: None ordered today. Your CT in 2017 showed your thoracic aortic aneurysm measurement 3.9cm. On your CT scan 09/2020 it was 4cm. We have referred you to cardiothoracic surgery for continued monitoring. These do not require surgical intervention until they measure 5.5cm.   Follow-Up: At [facility], you and your health needs are our priority.  As part of our continuing mission to provide you with exceptional heart care, we have created designated Provider Care Teams.  These Care Teams include your primary Cardiologist (physician) and Advanced Practice Providers (APPs -  Physician Assistants and Nurse Practitioners) who all work together to provide you with the care you need, when you need it.  We recommend signing up for the patient portal called "MyChart".  Sign up information is provided on this After Visit Summary.  MyChart is used to connect with patients for Virtual Visits (Telemedicine).  Patients are able to view lab/test results, encounter notes, upcoming appointments, etc.  Non-urgent messages can be sent to your provider as well.   To learn more about what you can do with MyChart, go to [email or web address].    Your next appointment:   6 month(s) 03/15/2021 at 3:00 PM  The format for your next appointment:   In Person  Provider:   You may see Sinclair Grooms, MD or one of the following Advanced  Practice Providers on your designated Care Team:   Kathyrn Drown, NP   Other Instructions  Information About Your Aneurysm  One of your tests has shown an aneurysm of your ascending thoracic aorta. The word "aneurysm" refers to a bulge in an artery (blood vessel). Most people think of them in the context of an emergency, but yours was found incidentally. At this point there is nothing you need to do from a procedure standpoint, but there are some important things to keep in mind for day-to-day life.  Mainstays of therapy for aneurysms include very good blood pressure control, healthy lifestyle, and avoiding tobacco products and street drugs. Research has raised concern that antibiotics in the fluoroquinolone class could be associated with increased risk of having an aneurysm develop or tear. This includes medicines that end in "floxacin," like Cipro or Levaquin. Make sure to discuss this information with other healthcare providers if you require antibiotics.  Since aneurysms can run in families, you should discuss your diagnosis with first degree relatives as they may need to be screened for this. Regular mild-moderate physical exercise is important, but avoid heavy lifting/weight lifting over 30lbs, chopping wood, shoveling snow or digging heavy earth with a shovel. It is best to avoid activities that cause grunting or straining (medically referred to as a "Valsalva maneuver"). This happens when a person bears down against a closed throat to increase the strength of arm or abdominal muscles. There's often a tendency to do this when lifting heavy weights, doing sit-ups, push-ups or chin-ups, etc., but  it may be harmful.  This is a finding I would expect to be monitored periodically by your cardiology team. Most unruptured thoracic aortic aneurysms cause no symptoms, so they are often found during exams for other conditions. Contact a health care provider if you develop any discomfort in your upper  back, neck, abdomen, trouble swallowing, cough or hoarseness, or unexplained weight loss. Get help right away if you develop severe pain in your upper back or abdomen that may move into your chest and arms, or any other concerning symptoms such as shortness of breath or fever.  Heart Healthy Diet Recommendations: A low-salt diet is recommended. Meats should be grilled, baked, or boiled. Avoid fried foods. Focus on lean protein sources like fish or chicken with vegetables and fruits. The American Heart Association is a Microbiologist!  American Heart Association Diet and Lifeystyle Recommendations   Exercise recommendations: The American Heart Association recommends 150 minutes of moderate intensity exercise weekly. Try 30 minutes of moderate intensity exercise 4-5 times per week. This could include walking, jogging, or swimming.

## 2020-09-28 ENCOUNTER — Telehealth: Payer: Self-pay

## 2020-09-28 DIAGNOSIS — E785 Hyperlipidemia, unspecified: Secondary | ICD-10-CM

## 2020-09-28 LAB — COMPREHENSIVE METABOLIC PANEL
ALT: 84 IU/L — ABNORMAL HIGH (ref 0–32)
AST: 56 IU/L — ABNORMAL HIGH (ref 0–40)
Albumin/Globulin Ratio: 2 (ref 1.2–2.2)
Albumin: 4.5 g/dL (ref 3.7–4.7)
Alkaline Phosphatase: 90 IU/L (ref 44–121)
BUN/Creatinine Ratio: 14 (ref 12–28)
BUN: 14 mg/dL (ref 8–27)
Bilirubin Total: 0.4 mg/dL (ref 0.0–1.2)
CO2: 24 mmol/L (ref 20–29)
Calcium: 9.8 mg/dL (ref 8.7–10.3)
Chloride: 103 mmol/L (ref 96–106)
Creatinine, Ser: 0.99 mg/dL (ref 0.57–1.00)
Globulin, Total: 2.2 g/dL (ref 1.5–4.5)
Glucose: 97 mg/dL (ref 65–99)
Potassium: 5.1 mmol/L (ref 3.5–5.2)
Sodium: 142 mmol/L (ref 134–144)
Total Protein: 6.7 g/dL (ref 6.0–8.5)
eGFR: 59 mL/min/{1.73_m2} — ABNORMAL LOW (ref >59)

## 2020-09-28 LAB — LIPID PANEL
Chol/HDL Ratio: 4.5 ratio — ABNORMAL HIGH (ref 0.0–4.4)
Cholesterol, Total: 219 mg/dL — ABNORMAL HIGH (ref 100–199)
HDL: 49 mg/dL (ref >39)
LDL Chol Calc (NIH): 126 mg/dL — ABNORMAL HIGH (ref 0–99)
Triglycerides: 250 mg/dL — ABNORMAL HIGH (ref 0–149)
VLDL Cholesterol Cal: 44 mg/dL — ABNORMAL HIGH (ref 5–40)

## 2020-09-28 NOTE — Telephone Encounter (Signed)
Called pt reviewed results and provider recommendations.  She verbalizes understanding.  Pt c/o abdominal pain that has occurred for 2-3 months.  She has had loose stools that are now soft.  She denies black tarry stools, only a small amount of blood noted.  Per pt she has hemorrhoids.  She also reports that her acid reflux has increased based on what she eats.  She reports she takes reflux med more than she has previously.  I advised her to follow up with her PCP.  She expressed that she would call to schedule an appointment. Pt had no further questions or concerns.

## 2020-09-28 NOTE — Telephone Encounter (Signed)
-----   Message from Loel Dubonnet, NP sent at 09/28/2020  8:27 AM EDT ----- Stable kidney function. Normal electrolytes. Liver enzymes elevated. Recommend holding Rosuvastatin if taking. Lipid panel shows triglycerides elevated, recommend reducing intake of carbohydrates, sweets, sugars. LDL remains above goal. Recommend referral to lipid clinic.

## 2020-10-05 ENCOUNTER — Ambulatory Visit (INDEPENDENT_AMBULATORY_CARE_PROVIDER_SITE_OTHER): Payer: Medicare Other

## 2020-10-05 ENCOUNTER — Other Ambulatory Visit: Payer: Self-pay

## 2020-10-05 DIAGNOSIS — I480 Paroxysmal atrial fibrillation: Secondary | ICD-10-CM

## 2020-10-05 DIAGNOSIS — Z5181 Encounter for therapeutic drug level monitoring: Secondary | ICD-10-CM

## 2020-10-05 LAB — POCT INR: INR: 2.1 (ref 2.0–3.0)

## 2020-10-05 NOTE — Patient Instructions (Signed)
continue taking 1.5 tablets daily. Next INR in 4 weeks. Coumadin Clinic [phone]

## 2020-10-09 DIAGNOSIS — D179 Benign lipomatous neoplasm, unspecified: Secondary | ICD-10-CM | POA: Diagnosis not present

## 2020-10-09 DIAGNOSIS — R221 Localized swelling, mass and lump, neck: Secondary | ICD-10-CM | POA: Diagnosis not present

## 2020-10-09 DIAGNOSIS — R222 Localized swelling, mass and lump, trunk: Secondary | ICD-10-CM | POA: Diagnosis not present

## 2020-10-14 ENCOUNTER — Other Ambulatory Visit: Payer: Self-pay | Admitting: Interventional Cardiology

## 2020-10-14 DIAGNOSIS — I480 Paroxysmal atrial fibrillation: Secondary | ICD-10-CM

## 2020-10-15 ENCOUNTER — Other Ambulatory Visit: Payer: Self-pay

## 2020-10-15 ENCOUNTER — Ambulatory Visit (INDEPENDENT_AMBULATORY_CARE_PROVIDER_SITE_OTHER): Payer: Medicare Other | Admitting: Pharmacist

## 2020-10-15 DIAGNOSIS — E785 Hyperlipidemia, unspecified: Secondary | ICD-10-CM | POA: Diagnosis not present

## 2020-10-15 DIAGNOSIS — E7800 Pure hypercholesterolemia, unspecified: Secondary | ICD-10-CM | POA: Diagnosis not present

## 2020-10-15 NOTE — Progress Notes (Signed)
Patient ID: [name]                 DOB: [birth year]                    MRN: [id number]     HPI: [name] is a 75 y.o. female patient of Dr Tamala Julian referred to lipid clinic by Laurann Montana, NP. 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 She was seen in lipid clinic in 2021 for history of statin intolerance and was started on Repatha due to baseline LDL as high as 269. She was also rechallenged with low dose rosuvastatin a few days a week (myalgias on daily atorvastatin, rosuvastatin, and ezetimibe). Lipids checked June 2022 and showed improvement in LDL to 126, however ALT increased from 13 to 84 and AST increased from 18 to 56. She was advised to hold her rosuvastatin and was referred to lipid clinic.  Pt presents today in good spirits. Had a few lipomas removed recently. Tolerating her Repatha, had been getting for free from [employer], but this just expired and grant is closed for re-enrollment. Had been tolerating rosuvastatin twice weekly. Sees GI tomorrow - had been dealing with abdominal pain for 2-3 months, was also taking a lot of Tylenol. She has been working on decreasing her portions, noted when she ate larger meals this contributed to GI issues as well. Could cut back on carb intake, no alcohol.  Current Medications: Repatha 140mg  Q2W Intolerances: atorvastatin, rosuvastatin 5mg  and 20mg  daily, ezetimibe 10mg  daily - myalgias Risk Factors: stroke, aortic atherosclerosis, baseline LDL 269 LDL goal: 70mg /dL  Diet: Watching portions. Could cut back on carbs, likes pasta, chicken and fish.  Exercise: Walks, nothing formal though  Family History: Mother with first stroke in her 32s and died at 67, maternal grandmother had first stroke in her 59s. 3 children, 2 with known elevated  cholesterol  Social History: Denies tobacco and drug use, rare alcohol use.  Labs: 09/27/20: TC 219, TG 250, HDL 49, LDL 126, AST 56, ALT 84 11/03/19: TC 316, TG 165, HDL 51, LDL 233, AST 18, ALT 13 (no LLT) 02/2019: TC 377, TG 323, HDL 43, LDL 269 (no LLT)  Past Medical History:  Diagnosis Date   Acoustic neuroma ([place])    Hx of right ear acoustic neuroma, removed in 1999. No hearing in right ear.   Anxiety    Hx of, responds to Wellbutrin   Basal cell carcinoma    s/p MOHS surgery in 03/2011   Cataract    Chronic anticoagulation 11/24/2016   Chronic cholecystitis with calculus 05/13/2013   Closed fracture of left lateral malleolus 08/10/2017   COPD (chronic obstructive pulmonary disease) (HCC)    Deafness in right ear    Embolic stroke ([place]) 5/32/9924   Family history of adverse reaction to anesthesia    Family hx of PONV   Fracture    left lateral malleolus fracture   GERD (gastroesophageal reflux disease)    Headache(784.0)    MIGRAINES   Hearing loss    Only in right ear, from an acoustic neuroma. S/P removal in 1999.    History of hiatal hernia    History of neck pain    Responds to Flexeril   History of shingles    Hypercholesteremia    Hypertension  Hypothyroidism    Melanoma ([place]) 2018   right arm-surgery only   PAF (paroxysmal atrial fibrillation) ([place])    2 brief episodes of Afib recorded on monitor 02/2007   Paroxysmal atrial fibrillation (HCC)    PONV (postoperative nausea and vomiting)    Presence of permanent cardiac pacemaker 2018   for A-fib   Sick sinus syndrome ([place]) 12/19/2016   Sinus bradycardia    Tachycardia-bradycardia syndrome ([place]) 11/24/2016   TIA (transient ischemic attack) 2011   On coumadin. Asymptomatic, without recurrence.    Current Outpatient Medications on File Prior to Visit  Medication Sig Dispense Refill   Cholecalciferol (VITAMIN D3 PO) Take by mouth.     clonazePAM (KLONOPIN) 0.5 MG tablet Take 0.5 mg by mouth daily.      enoxaparin (LOVENOX) 60 MG/0.6ML injection Inject 0.6 mLs (60 mg total) into the skin every 12 (twelve) hours. 12 mL 1   fluticasone (FLONASE) 50 MCG/ACT nasal spray Place 1 spray into both nostrils daily.     JUBLIA 10 % SOLN apply to affected nails qd     levothyroxine (SYNTHROID, LEVOTHROID) 88 MCG tablet Take 88 mcg by mouth daily before breakfast.     loratadine (CLARITIN) 10 MG tablet Take 10 mg by mouth daily as needed for allergies.     metoprolol succinate (TOPROL XL) 25 MG 24 hr tablet Take 0.5 tablets (12.5 mg total) by mouth daily. 45 tablet 0   pantoprazole (PROTONIX) 40 MG tablet Take 40 mg by mouth daily as needed (acid reflux).     REPATHA SURECLICK 235 MG/ML SOAJ INJECT CONTENTS OF 1 PEN SUBCUTANEOUSLY EVERY 14 DAYS. 2 mL 11   vitamin B-12 (CYANOCOBALAMIN) 1000 MCG tablet 2 tablets     warfarin (COUMADIN) 2.5 MG tablet TAKE 1-2 TABLETS BY MOUTH DAILY AS DIRECTED BY COUMADIN CLINIC. 120 tablet 2   No current facility-administered medications on file prior to visit.    Allergies  Allergen Reactions   Atorvastatin Other (See Comments)    MUSCLE WEAKNESS   Statins Other (See Comments)    MUSCLE WEAKNESS [CLASS EFFECT]   Ezetimibe     Muscle pain, trouble going to the bathroom.    Codeine Nausea Only   Latex Rash   Penicillins Rash and Other (See Comments)    Has patient had a PCN reaction causing immediate rash, facial/tongue/throat swelling, SOB or lightheadedness with hypotension: Rash in hands  Has patient had a PCN reaction causing severe rash involving mucus membranes or skin necrosis: no Has patient had a PCN reaction that required hospitalization No Has patient had a PCN reaction occurring within the last 10 years: No  If all of the above answers are "NO", then may proceed with Cephalosporin use.     Assessment/Plan:  1. Hyperlipidemia - LDL 126 on Repatha 140mg  Q2W and rosuvastatin 5mg  twice weekly, rosuvastatin has since been discontinued due to elevation in  LFTs. LFT increase likely secondary to onging GI issues that are being evaluated tomorrow as well as frequent Tylenol use. She has stopped using Tylenol since her LFTs were elevated, will recheck today. Will call pt on Wednesday after she has GI appt to discuss restarting low dose/frequency rosuvastatin 5mg  twice weekly (intolerant to daily dosing + 2 other statins) or retrying ezetimibe at lower dose of 5mg  daily for better tolerability. Ideally prefer LDL goal < 70 due to atherosclerosis, HeFH, and stroke history, however with her baseline LDL of ~270 and statin intolerance this may be difficult. [employer]  has closed so her Repatha copay will go up, and addition of Vasepa or Nexletol would currently be cost prohibitive. She will work in increasing walking/decreasing carb intake to help lower TG.  Chick Cousins E. Patsie Mccardle, PharmD, BCACP, [place] [address], [place], [place] [address] Phone: [phone]; Fax: [phone] 10/15/2020 3:12 PM

## 2020-10-15 NOTE — Patient Instructions (Addendum)
We'll check your liver enzymes today, keep your GI appointment tomorrow  Continue Repatha 140mg  injections every 2 weeks  We will likely restart your rosuvastatin (Crestor) 5mg  twice weekly. We could also try a lower dose of ezetimibe (Zetia) 5mg  daily which is typically tolerated better

## 2020-10-16 ENCOUNTER — Other Ambulatory Visit: Payer: Self-pay | Admitting: Physician Assistant

## 2020-10-16 DIAGNOSIS — Z1211 Encounter for screening for malignant neoplasm of colon: Secondary | ICD-10-CM | POA: Diagnosis not present

## 2020-10-16 DIAGNOSIS — R748 Abnormal levels of other serum enzymes: Secondary | ICD-10-CM | POA: Diagnosis not present

## 2020-10-16 DIAGNOSIS — R197 Diarrhea, unspecified: Secondary | ICD-10-CM | POA: Diagnosis not present

## 2020-10-16 LAB — HEPATIC FUNCTION PANEL
ALT: 16 IU/L (ref 0–32)
AST: 17 IU/L (ref 0–40)
Albumin: 4.1 g/dL (ref 3.7–4.7)
Alkaline Phosphatase: 81 IU/L (ref 44–121)
Bilirubin Total: 0.2 mg/dL (ref 0.0–1.2)
Bilirubin, Direct: <0.1 mg/dL (ref 0.00–0.40)
Total Protein: 5.9 g/dL — ABNORMAL LOW (ref 6.0–8.5)

## 2020-10-17 ENCOUNTER — Telehealth: Payer: Self-pay | Admitting: Pharmacist

## 2020-10-17 DIAGNOSIS — E785 Hyperlipidemia, unspecified: Secondary | ICD-10-CM

## 2020-10-17 MED ORDER — ROSUVASTATIN CALCIUM 5 MG PO TABS: ORAL_TABLET | ORAL | 3 refills | Status: DC

## 2020-10-17 NOTE — Telephone Encounter (Signed)
LFTs have normalized. Will plan to rechallenge with low dose rosuvastatin 5mg  2x per week as GI pain and frequent Tylenol use were likely contributing to previously elevated LFTs. Can consider rechallenging with lower dose of ezetimibe 5mg  daily as well. Continue Repatha.  Left message for pt to discuss. Will also see how GI appt went yesterday, notes unavailable in Epic. Will need f/u lipid and LFT scheduled.

## 2020-10-17 NOTE — Telephone Encounter (Signed)
Spoke with pt, she states GI appt went well yesterday. Most of her GI sx have resolved but she is scheduled for Abdominal ultrasound on 7/26.  Will restart rosuvastatin 5mg  3x per week and continue Repatha 140mg  Q2W. Recheck lipids and LFTs in 4 weeks - closer follow up to keep an eye on LFTs.

## 2020-10-22 ENCOUNTER — Ambulatory Visit (INDEPENDENT_AMBULATORY_CARE_PROVIDER_SITE_OTHER): Payer: Medicare Other

## 2020-10-22 DIAGNOSIS — I495 Sick sinus syndrome: Secondary | ICD-10-CM

## 2020-10-23 LAB — CUP PACEART REMOTE DEVICE CHECK
Battery Remaining Longevity: 115 mo
Battery Voltage: 3 V
Brady Statistic AP VP Percent: 0.29 %
Brady Statistic AP VS Percent: 96.85 %
Brady Statistic AS VP Percent: 0 %
Brady Statistic AS VS Percent: 2.86 %
Brady Statistic RA Percent Paced: 97.35 %
Brady Statistic RV Percent Paced: 0.29 %
Date Time Interrogation Session: 20220717211324
Implantable Lead Implant Date: 20180914
Implantable Lead Implant Date: 20180914
Implantable Lead Location: 753859
Implantable Lead Location: 753860
Implantable Lead Model: 5076
Implantable Lead Model: 5076
Implantable Pulse Generator Implant Date: 20180914
Lead Channel Impedance Value: 323 Ohm
Lead Channel Impedance Value: 380 Ohm
Lead Channel Impedance Value: 418 Ohm
Lead Channel Impedance Value: 494 Ohm
Lead Channel Pacing Threshold Amplitude: 0.625 V
Lead Channel Pacing Threshold Amplitude: 0.875 V
Lead Channel Pacing Threshold Pulse Width: 0.4 ms
Lead Channel Pacing Threshold Pulse Width: 0.4 ms
Lead Channel Sensing Intrinsic Amplitude: 12 mV
Lead Channel Sensing Intrinsic Amplitude: 12 mV
Lead Channel Sensing Intrinsic Amplitude: 3.125 mV
Lead Channel Sensing Intrinsic Amplitude: 3.125 mV
Lead Channel Setting Pacing Amplitude: 1.5 V
Lead Channel Setting Pacing Amplitude: 2.5 V
Lead Channel Setting Pacing Pulse Width: 0.4 ms
Lead Channel Setting Sensing Sensitivity: 0.9 mV

## 2020-10-24 ENCOUNTER — Other Ambulatory Visit: Payer: Self-pay | Admitting: Family Medicine

## 2020-10-24 DIAGNOSIS — Z09 Encounter for follow-up examination after completed treatment for conditions other than malignant neoplasm: Secondary | ICD-10-CM

## 2020-10-25 ENCOUNTER — Other Ambulatory Visit: Payer: Self-pay

## 2020-10-25 ENCOUNTER — Institutional Professional Consult (permissible substitution) (INDEPENDENT_AMBULATORY_CARE_PROVIDER_SITE_OTHER): Payer: Medicare Other | Admitting: Cardiothoracic Surgery

## 2020-10-25 VITALS — BP 111/74 | HR 89 | Resp 20 | Ht 65.0 in | Wt 148.0 lb

## 2020-10-25 DIAGNOSIS — I7121 Aneurysm of the ascending aorta, without rupture: Secondary | ICD-10-CM

## 2020-10-25 DIAGNOSIS — I712 Thoracic aortic aneurysm, without rupture: Secondary | ICD-10-CM | POA: Diagnosis not present

## 2020-10-25 NOTE — Progress Notes (Signed)
[address]       [place] [address]             [phone]     CARDIOTHORACIC SURGERY office visitation  Referring Provider is Loel Dubonnet, NP Primary Cardiologist is Sinclair Grooms, MD PCP is Aura Dials, MD  Chief Complaint  Patient presents with   Thoracic Aortic Aneurysm    Surgical consult, Chest CT 09/17/20-[facility]     HPI:  75 year old lady is referred for evaluation of ascending aortic aneurysms.  She was in her usual state of health being monitored for lipomas or other soft tissue defects.  This led to CT of the chest which demonstrated a small ascending aortic aneurysm.  He does have a history of hypertension but this is reasonably well controlled now.  Her past medical history is otherwise notable for atrial fibrillation and is on chronic anticoagulation.  She has been treated for a hiatal hernia medically  Past Medical History:  Diagnosis Date   Acoustic neuroma ([place])    Hx of right ear acoustic neuroma, removed in 1999. No hearing in right ear.   Anxiety    Hx of, responds to Wellbutrin   Basal cell carcinoma    s/p MOHS surgery in 03/2011   Cataract    Chronic anticoagulation 11/24/2016   Chronic cholecystitis with calculus 05/13/2013   Closed fracture of left lateral malleolus 08/10/2017   COPD (chronic obstructive pulmonary disease) (HCC)    Deafness in right ear    Embolic stroke ([place]) 1/96/2229   Family history of adverse reaction to anesthesia    Family hx of PONV   Fracture    left lateral malleolus fracture   GERD (gastroesophageal reflux disease)    Headache(784.0)    MIGRAINES   Hearing loss    Only in right ear, from an acoustic neuroma. S/P removal in 1999.    History of hiatal hernia    History of neck pain    Responds to Flexeril   History of shingles    Hypercholesteremia    Hypertension    Hypothyroidism    Melanoma ([place]) 2018   right arm-surgery only   PAF (paroxysmal atrial fibrillation)  ([place])    2 brief episodes of Afib recorded on monitor 02/2007   Paroxysmal atrial fibrillation (HCC)    PONV (postoperative nausea and vomiting)    Presence of permanent cardiac pacemaker 2018   for A-fib   Sick sinus syndrome ([place]) 12/19/2016   Sinus bradycardia    Tachycardia-bradycardia syndrome ([place]) 11/24/2016   TIA (transient ischemic attack) 2011   On coumadin. Asymptomatic, without recurrence.    Past Surgical History:  Procedure Laterality Date   ABDOMINAL HYSTERECTOMY     APPENDECTOMY     BREAST BIOPSY     X 3   BREAST CYST ASPIRATION Left 50 yrs ago per pt   BREAST EXCISIONAL BIOPSY Left 50 yrs ago   BREAST LUMPECTOMY WITH RADIOACTIVE SEED LOCALIZATION Left 06/21/2019   Procedure: LEFT BREAST LUMPECTOMY WITH RADIOACTIVE SEED LOCALIZATION;  Surgeon: Donnie Mesa, MD;  Location: [facility];  Service: General;  Laterality: Left;   CHOLECYSTECTOMY N/A 05/26/2013   Procedure: LAPAROSCOPIC CHOLECYSTECTOMY WITH INTRAOPERATIVE CHOLANGIOGRAM;  Surgeon: Imogene Burn. Georgette Dover, MD;  Location: [facility];  Service: General;  Laterality: N/A;   CRANIECTOMY FOR EXCISION OF ACOUSTIC NEUROMA Right 1999   INTRAOCULAR LENS INSERTION     Dr. Talbert Forest   MOHS SURGERY  03/2011   Basal cell skin cancer   ORIF ANKLE FRACTURE Left 08/10/2017   Procedure: OPEN REDUCTION INTERNAL FIXATION (ORIF) LEFT ANKLE FRACTURE;  Surgeon: Rod Can, MD;  Location: [facility];  Service: Orthopedics;  Laterality: Left;   PACEMAKER IMPLANT N/A 12/19/2016   Medtronic Azure XT MRI conditional dual-chamber pacemaker for symptomatic sinus bradycardia by Dr Rayann Heman   TONSILLECTOMY      Family History  Problem Relation Age of Onset   Alcoholism Father    Liver cancer Father    Diabetes Mother    High Cholesterol Mother    Hypertension Mother    Memory loss Mother    Healthy Sister    Breast cancer Maternal Aunt    Brain cancer Maternal Aunt    Cancer Maternal Grandmother        Unknown type   Hypertension  Other        paternal & maternal side with HTN, all lived into their 36s   Heart disease Other        paternal & maternal side with heart disease, all lived into their 59s   Hyperlipidemia Other        paternal & maternal side with hyperlipidemia, all lived into their 72s   CVA Other        paternal & maternal side with strokes, all lived into their 82s    Social History   Socioeconomic History   Marital status: Married    Spouse name: [name]   Number of children: 3   Years of education: masters   Highest education level: Not on file  Occupational History   Occupation: retired    Comment: [employer]  Tobacco Use   Smoking status: Never   Smokeless tobacco: Never  Vaping Use   Vaping Use: Never used  Substance and Sexual Activity   Alcohol use: Yes    Comment: Rare   Drug use: No   Sexual activity: Not Currently    Birth control/protection: Surgical  Other Topics Concern   Not on file  Social History Narrative   Pt lives with spouse.    Caffeine Use: 1-2 glasses per week.   Social Determinants of Health   Financial Resource Strain: Not on file  Food Insecurity: Not on file  Transportation Needs: Not on file  Physical Activity: Not on file  Stress: Not on file  Social Connections: Not on file  Intimate Partner Violence: Not on file    Current Outpatient Medications  Medication Sig Dispense Refill   Cholecalciferol (VITAMIN D3 PO) Take by mouth.     clonazePAM (KLONOPIN) 0.5 MG tablet Take 0.5 mg by mouth daily.     enoxaparin (LOVENOX) 60 MG/0.6ML injection Inject 0.6 mLs (60 mg total) into the skin every 12 (twelve) hours. 12 mL 1   fluticasone (FLONASE) 50 MCG/ACT nasal spray Place 1 spray into both nostrils daily.     JUBLIA 10 % SOLN apply to affected nails qd     levothyroxine (SYNTHROID, LEVOTHROID) 88 MCG tablet Take 88 mcg by mouth daily before breakfast.     loratadine (CLARITIN) 10 MG tablet Take 10 mg by mouth daily as needed for allergies.      metoprolol succinate (TOPROL XL) 25 MG 24 hr tablet Take 0.5 tablets (12.5 mg total) by mouth daily. 45 tablet 0   pantoprazole (PROTONIX) 40 MG tablet Take 40 mg by mouth daily as needed (acid reflux).     REPATHA SURECLICK 259 MG/ML SOAJ INJECT CONTENTS OF  1 PEN SUBCUTANEOUSLY EVERY 14 DAYS. 2 mL 11   rosuvastatin (CRESTOR) 5 MG tablet Take 1 tablet 3 times a week as tolerated. 45 tablet 3   vitamin B-12 (CYANOCOBALAMIN) 1000 MCG tablet 2 tablets     warfarin (COUMADIN) 2.5 MG tablet TAKE 1-2 TABLETS BY MOUTH DAILY AS DIRECTED BY COUMADIN CLINIC. 120 tablet 2   No current facility-administered medications for this visit.    Allergies  Allergen Reactions   Atorvastatin Other (See Comments)    MUSCLE WEAKNESS   Statins Other (See Comments)    MUSCLE WEAKNESS [CLASS EFFECT]   Ezetimibe     Muscle pain, trouble going to the bathroom.    Codeine Nausea Only   Latex Rash   Penicillins Rash and Other (See Comments)    Has patient had a PCN reaction causing immediate rash, facial/tongue/throat swelling, SOB or lightheadedness with hypotension: Rash in hands  Has patient had a PCN reaction causing severe rash involving mucus membranes or skin necrosis: no Has patient had a PCN reaction that required hospitalization No Has patient had a PCN reaction occurring within the last 10 years: No  If all of the above answers are "NO", then may proceed with Cephalosporin use.       Review of Systems:   General:  No weight change but decreased appetite  Cardiac:  Per HPI  Respiratory:  Negative for shortness of breath or cough  GI:   Difficulty swallowing and reflux; history of colonoscopy in 2018  GU:   Negative for kidney disease  Vascular:  Endorses foot pain  Neuro:   No history of strokes or TIAs  Musculoskeletal: History of joint swelling  Skin:   Soft tissue defects and skin itching  Psych:   Occasional anxiety requiring medication  Eyes:   Wears glasses  ENT:   Endorses some hearing  loss  Hematologic:  On chronic anticoagulation  Endocrine:  Diabetic     Physical Exam:   BP 111/74   Pulse 89   Resp 20   Ht 5\' 5"  (1.651 m)   Wt 67.1 kg   SpO2 95% Comment: RA  BMI 24.63 kg/m   General:    well-appearing  HEENT:  Unremarkable   Neck:   no JVD, no bruits, no adenopathy   Chest:   clear to auscultation, symmetrical breath sounds, no wheezes, no rhonchi   CV:   RRR, no detectable murmur   Abdomen:  soft, non-tender, no masses   Extremities:  warm, well-perfused, pulses intact throughout, no LE edema  Rectal/GU  Deferred  Neuro:   Grossly non-focal and symmetrical throughout  Skin:   Clean and dry, no rashes, no breakdown   Diagnostic Tests:  I personally reviewed her CT scan which was from the [facility] system and agree with the interpretation is a sub-5 cm ascending aortic aneurysm   Impression:  75 year old lady with incidentally discovered ascending aortic aneurysm.  This poses very low risk to her wellbeing at this point.   Plan:  Continue with blood pressure control Follow-up in 1 year with noncontrast CT scan   I spent in excess of 15 minutes during the conduct of this office consultation and >50% of this time involved direct face-to-face encounter with the patient for counseling and/or coordination of their care.          Level 3 Office Consult = 40 minutes         Level 4 Office Consult = 60 minutes  Level 5 Office Consult = 80 minutes  B.  Murvin Natal, MD 10/25/2020 9:31 AM

## 2020-10-29 DIAGNOSIS — Z1212 Encounter for screening for malignant neoplasm of rectum: Secondary | ICD-10-CM | POA: Diagnosis not present

## 2020-10-29 DIAGNOSIS — Z1211 Encounter for screening for malignant neoplasm of colon: Secondary | ICD-10-CM | POA: Diagnosis not present

## 2020-10-30 ENCOUNTER — Other Ambulatory Visit: Payer: Self-pay

## 2020-10-30 ENCOUNTER — Ambulatory Visit
Admission: RE | Admit: 2020-10-30 | Discharge: 2020-10-30 | Disposition: A | Discharge status: Home / Self Care | Payer: Medicare Other | Source: Ambulatory Visit | Attending: Physician Assistant | Admitting: Physician Assistant

## 2020-10-30 ENCOUNTER — Ambulatory Visit (INDEPENDENT_AMBULATORY_CARE_PROVIDER_SITE_OTHER): Payer: Medicare Other | Admitting: *Deleted

## 2020-10-30 DIAGNOSIS — K760 Fatty (change of) liver, not elsewhere classified: Secondary | ICD-10-CM | POA: Diagnosis not present

## 2020-10-30 DIAGNOSIS — I480 Paroxysmal atrial fibrillation: Secondary | ICD-10-CM

## 2020-10-30 DIAGNOSIS — R945 Abnormal results of liver function studies: Secondary | ICD-10-CM | POA: Diagnosis not present

## 2020-10-30 DIAGNOSIS — Z5181 Encounter for therapeutic drug level monitoring: Secondary | ICD-10-CM | POA: Diagnosis not present

## 2020-10-30 DIAGNOSIS — R748 Abnormal levels of other serum enzymes: Secondary | ICD-10-CM

## 2020-10-30 LAB — POCT INR: INR: 1.8 — AB (ref 2.0–3.0)

## 2020-10-30 NOTE — Patient Instructions (Signed)
Description    Take 2 tablets tonight and then continue taking 1.5 tablets daily. Next INR in 4 weeks. Coumadin Clinic [phone]

## 2020-11-01 DIAGNOSIS — L503 Dermatographic urticaria: Secondary | ICD-10-CM | POA: Diagnosis not present

## 2020-11-01 DIAGNOSIS — L308 Other specified dermatitis: Secondary | ICD-10-CM | POA: Diagnosis not present

## 2020-11-03 LAB — COLOGUARD: COLOGUARD: NEGATIVE

## 2020-11-03 LAB — EXTERNAL GENERIC LAB PROCEDURE: COLOGUARD: NEGATIVE

## 2020-11-05 ENCOUNTER — Other Ambulatory Visit: Payer: Self-pay | Admitting: Family Medicine

## 2020-11-05 DIAGNOSIS — Z9889 Other specified postprocedural states: Secondary | ICD-10-CM

## 2020-11-08 ENCOUNTER — Other Ambulatory Visit: Payer: Self-pay

## 2020-11-08 ENCOUNTER — Ambulatory Visit
Admission: RE | Admit: 2020-11-08 | Discharge: 2020-11-08 | Disposition: A | Discharge status: Home / Self Care | Payer: Medicare Other | Source: Ambulatory Visit | Attending: Family Medicine | Admitting: Family Medicine

## 2020-11-08 DIAGNOSIS — R922 Inconclusive mammogram: Secondary | ICD-10-CM | POA: Diagnosis not present

## 2020-11-08 DIAGNOSIS — Z9889 Other specified postprocedural states: Secondary | ICD-10-CM

## 2020-11-09 DIAGNOSIS — F419 Anxiety disorder, unspecified: Secondary | ICD-10-CM | POA: Diagnosis not present

## 2020-11-09 DIAGNOSIS — I7789 Other specified disorders of arteries and arterioles: Secondary | ICD-10-CM | POA: Diagnosis not present

## 2020-11-09 DIAGNOSIS — K760 Fatty (change of) liver, not elsewhere classified: Secondary | ICD-10-CM | POA: Diagnosis not present

## 2020-11-09 DIAGNOSIS — R1030 Lower abdominal pain, unspecified: Secondary | ICD-10-CM | POA: Diagnosis not present

## 2020-11-09 DIAGNOSIS — R945 Abnormal results of liver function studies: Secondary | ICD-10-CM | POA: Diagnosis not present

## 2020-11-09 DIAGNOSIS — L299 Pruritus, unspecified: Secondary | ICD-10-CM | POA: Diagnosis not present

## 2020-11-09 DIAGNOSIS — F32A Depression, unspecified: Secondary | ICD-10-CM | POA: Diagnosis not present

## 2020-11-12 DIAGNOSIS — D179 Benign lipomatous neoplasm, unspecified: Secondary | ICD-10-CM | POA: Diagnosis not present

## 2020-11-12 DIAGNOSIS — R2231 Localized swelling, mass and lump, right upper limb: Secondary | ICD-10-CM | POA: Diagnosis not present

## 2020-11-12 DIAGNOSIS — R221 Localized swelling, mass and lump, neck: Secondary | ICD-10-CM | POA: Diagnosis not present

## 2020-11-14 NOTE — Progress Notes (Signed)
Remote pacemaker transmission.   

## 2020-11-15 ENCOUNTER — Other Ambulatory Visit: Payer: Medicare Other

## 2020-11-26 ENCOUNTER — Other Ambulatory Visit: Payer: Self-pay

## 2020-11-26 ENCOUNTER — Ambulatory Visit (INDEPENDENT_AMBULATORY_CARE_PROVIDER_SITE_OTHER): Payer: Medicare Other

## 2020-11-26 DIAGNOSIS — Z5181 Encounter for therapeutic drug level monitoring: Secondary | ICD-10-CM

## 2020-11-26 DIAGNOSIS — I480 Paroxysmal atrial fibrillation: Secondary | ICD-10-CM

## 2020-11-26 LAB — POCT INR: INR: 2.1 (ref 2.0–3.0)

## 2020-11-26 NOTE — Patient Instructions (Signed)
Description   Continue taking 1.5 tablets daily. Next INR in 5 weeks. Coumadin Clinic [phone]

## 2020-11-29 ENCOUNTER — Other Ambulatory Visit: Payer: Self-pay | Admitting: Interventional Cardiology

## 2020-11-29 MED ORDER — REPATHA SURECLICK 140 MG/ML ~~LOC~~ SOAJ: SUBCUTANEOUS | 11 refills | Status: DC

## 2020-12-07 ENCOUNTER — Telehealth: Payer: Self-pay | Admitting: Pharmacist

## 2020-12-07 ENCOUNTER — Other Ambulatory Visit: Payer: Medicare Other | Admitting: *Deleted

## 2020-12-07 ENCOUNTER — Other Ambulatory Visit: Payer: Self-pay

## 2020-12-07 DIAGNOSIS — E785 Hyperlipidemia, unspecified: Secondary | ICD-10-CM | POA: Diagnosis not present

## 2020-12-07 LAB — HEPATIC FUNCTION PANEL
ALT: 13 IU/L (ref 0–32)
AST: 18 IU/L (ref 0–40)
Albumin: 4.4 g/dL (ref 3.7–4.7)
Alkaline Phosphatase: 74 IU/L (ref 44–121)
Bilirubin Total: 0.3 mg/dL (ref 0.0–1.2)
Bilirubin, Direct: <0.1 mg/dL (ref 0.00–0.40)
Total Protein: 6.2 g/dL (ref 6.0–8.5)

## 2020-12-07 LAB — LIPID PANEL
Chol/HDL Ratio: 4.3 ratio (ref 0.0–4.4)
Cholesterol, Total: 222 mg/dL — ABNORMAL HIGH (ref 100–199)
HDL: 52 mg/dL (ref >39)
LDL Chol Calc (NIH): 143 mg/dL — ABNORMAL HIGH (ref 0–99)
Triglycerides: 150 mg/dL — ABNORMAL HIGH (ref 0–149)
VLDL Cholesterol Cal: 27 mg/dL (ref 5–40)

## 2020-12-07 NOTE — Telephone Encounter (Signed)
Pt reported muscle aches on rosuvastatin '5mg'$  3x/week so she stopped this 2 weeks ago. Has also missed some doses of Repatha since she is in the donut hole. Came for lab work today to assess lipids but wanted to let us know her #s will not look as good because of this. Med/allergy list updated. Provided pt with 2 Repatha samples as well, she was appreciative for the assistance.

## 2020-12-07 NOTE — Telephone Encounter (Signed)
Discussed lab results with pt, below factors explain mild increase in LDL. She has been working on dietary improvements though which is reflected in improvement of her TG and VLDL. She is already intolerant to atorvastatin and ezetimibe. Nexletol is cost prohibitive as well since pt is already in the donut hole. I did provide her with a few samples of Repatha this AM when she came in for labs. LFTs have normalized. Only option would be rechallenging with low dose of pravastatin or ezetimibe. Spoke with pt about results, she does not wish to retry another statin. She will think about ezetimibe rechallenge (could try lower dose of '5mg'$  daily) and call me back if she wishes to try this.

## 2020-12-14 DIAGNOSIS — Z23 Encounter for immunization: Secondary | ICD-10-CM | POA: Diagnosis not present

## 2020-12-18 DIAGNOSIS — L298 Other pruritus: Secondary | ICD-10-CM | POA: Diagnosis not present

## 2020-12-18 DIAGNOSIS — L538 Other specified erythematous conditions: Secondary | ICD-10-CM | POA: Diagnosis not present

## 2020-12-18 DIAGNOSIS — L570 Actinic keratosis: Secondary | ICD-10-CM | POA: Diagnosis not present

## 2020-12-18 DIAGNOSIS — L270 Generalized skin eruption due to drugs and medicaments taken internally: Secondary | ICD-10-CM | POA: Diagnosis not present

## 2020-12-18 DIAGNOSIS — L820 Inflamed seborrheic keratosis: Secondary | ICD-10-CM | POA: Diagnosis not present

## 2020-12-25 DIAGNOSIS — N939 Abnormal uterine and vaginal bleeding, unspecified: Secondary | ICD-10-CM | POA: Diagnosis not present

## 2020-12-25 DIAGNOSIS — Z23 Encounter for immunization: Secondary | ICD-10-CM | POA: Diagnosis not present

## 2020-12-27 DIAGNOSIS — N952 Postmenopausal atrophic vaginitis: Secondary | ICD-10-CM | POA: Diagnosis not present

## 2020-12-27 DIAGNOSIS — R102 Pelvic and perineal pain: Secondary | ICD-10-CM | POA: Diagnosis not present

## 2020-12-31 ENCOUNTER — Ambulatory Visit (INDEPENDENT_AMBULATORY_CARE_PROVIDER_SITE_OTHER): Payer: Medicare Other

## 2020-12-31 ENCOUNTER — Other Ambulatory Visit: Payer: Self-pay

## 2020-12-31 DIAGNOSIS — I480 Paroxysmal atrial fibrillation: Secondary | ICD-10-CM

## 2020-12-31 DIAGNOSIS — Z5181 Encounter for therapeutic drug level monitoring: Secondary | ICD-10-CM | POA: Diagnosis not present

## 2020-12-31 LAB — POCT INR: INR: 2.5 (ref 2.0–3.0)

## 2020-12-31 NOTE — Patient Instructions (Signed)
Continue taking 1.5 tablets daily. Next INR in 6 weeks. Coumadin Clinic [phone]

## 2021-01-03 ENCOUNTER — Encounter: Payer: Self-pay | Admitting: Pharmacist

## 2021-01-03 DIAGNOSIS — J0110 Acute frontal sinusitis, unspecified: Secondary | ICD-10-CM | POA: Diagnosis not present

## 2021-01-10 DIAGNOSIS — R102 Pelvic and perineal pain: Secondary | ICD-10-CM | POA: Diagnosis not present

## 2021-01-21 ENCOUNTER — Ambulatory Visit (INDEPENDENT_AMBULATORY_CARE_PROVIDER_SITE_OTHER): Payer: Medicare Other

## 2021-01-21 DIAGNOSIS — I495 Sick sinus syndrome: Secondary | ICD-10-CM | POA: Diagnosis not present

## 2021-01-22 LAB — CUP PACEART REMOTE DEVICE CHECK
Battery Remaining Longevity: 113 mo
Battery Voltage: 3 V
Brady Statistic AP VP Percent: 0.63 %
Brady Statistic AP VS Percent: 96.38 %
Brady Statistic AS VP Percent: 0.01 %
Brady Statistic AS VS Percent: 2.98 %
Brady Statistic RA Percent Paced: 97.31 %
Brady Statistic RV Percent Paced: 0.64 %
Date Time Interrogation Session: 20221016220925
Implantable Lead Implant Date: 20180914
Implantable Lead Implant Date: 20180914
Implantable Lead Location: 753859
Implantable Lead Location: 753860
Implantable Lead Model: 5076
Implantable Lead Model: 5076
Implantable Pulse Generator Implant Date: 20180914
Lead Channel Impedance Value: 342 Ohm
Lead Channel Impedance Value: 399 Ohm
Lead Channel Impedance Value: 437 Ohm
Lead Channel Impedance Value: 513 Ohm
Lead Channel Pacing Threshold Amplitude: 0.625 V
Lead Channel Pacing Threshold Amplitude: 0.875 V
Lead Channel Pacing Threshold Pulse Width: 0.4 ms
Lead Channel Pacing Threshold Pulse Width: 0.4 ms
Lead Channel Sensing Intrinsic Amplitude: 12.75 mV
Lead Channel Sensing Intrinsic Amplitude: 12.75 mV
Lead Channel Sensing Intrinsic Amplitude: 3.125 mV
Lead Channel Sensing Intrinsic Amplitude: 3.125 mV
Lead Channel Setting Pacing Amplitude: 1.5 V
Lead Channel Setting Pacing Amplitude: 2.5 V
Lead Channel Setting Pacing Pulse Width: 0.4 ms
Lead Channel Setting Sensing Sensitivity: 0.9 mV

## 2021-01-29 NOTE — Progress Notes (Signed)
Remote pacemaker transmission.   

## 2021-02-06 ENCOUNTER — Telehealth: Payer: Self-pay | Admitting: Internal Medicine

## 2021-02-06 DIAGNOSIS — F418 Other specified anxiety disorders: Secondary | ICD-10-CM | POA: Diagnosis not present

## 2021-02-06 DIAGNOSIS — I10 Essential (primary) hypertension: Secondary | ICD-10-CM | POA: Diagnosis not present

## 2021-02-06 DIAGNOSIS — H9192 Unspecified hearing loss, left ear: Secondary | ICD-10-CM | POA: Diagnosis not present

## 2021-02-06 DIAGNOSIS — I4891 Unspecified atrial fibrillation: Secondary | ICD-10-CM | POA: Diagnosis not present

## 2021-02-06 NOTE — Telephone Encounter (Signed)
Reports of palpitations over the pat several months and increased nausea and lightheadedness over the past week.  Patient is currently asymptomatic.  Transmission received and reviewed.  5 NSVT events logged, 2 fast A&V. Appear (1:1) and longest in duration was 1.5 minutes. Patient is currently taking Toprol-XL 25 mg daily~  MAR is currently at 12.5 mg daily), coumadin (as directed).   Patient is currently out of town and feeling good at the moment. Advised I will forward to Dr. Rayann Heman for review and await recommendations. ED precautions reviewed with patient with verbal understanding.

## 2021-02-06 NOTE — Telephone Encounter (Signed)
Pt is calling to make our office aware that she is having increase in episodes of SVT along with lightheadedness and nausea. Pt is currently out of town right now

## 2021-02-09 NOTE — Telephone Encounter (Signed)
Make sure she is taking toprol 25mg  daily going forward. Continue to follow.

## 2021-02-11 ENCOUNTER — Other Ambulatory Visit: Payer: Self-pay

## 2021-02-11 ENCOUNTER — Ambulatory Visit (INDEPENDENT_AMBULATORY_CARE_PROVIDER_SITE_OTHER): Payer: Medicare Other

## 2021-02-11 DIAGNOSIS — I480 Paroxysmal atrial fibrillation: Secondary | ICD-10-CM

## 2021-02-11 DIAGNOSIS — Z5181 Encounter for therapeutic drug level monitoring: Secondary | ICD-10-CM

## 2021-02-11 LAB — POCT INR: INR: 3.8 — AB (ref 2.0–3.0)

## 2021-02-11 NOTE — Telephone Encounter (Signed)
Patient called and advised of Dr. Bonita Quin recommendations. Verbalized understanding. Advised to call back with any questions or concerns.

## 2021-02-11 NOTE — Patient Instructions (Signed)
Hold tonight and take only 1 tablet Tuesday and then Continue taking 1.5 tablets daily. Next INR in 1 week.  Bactrim started 11/4-11/14 Cou[facility]in Clinic [phone]

## 2021-02-18 DIAGNOSIS — Z08 Encounter for follow-up examination after completed treatment for malignant neoplasm: Secondary | ICD-10-CM | POA: Diagnosis not present

## 2021-02-18 DIAGNOSIS — L249 Irritant contact dermatitis, unspecified cause: Secondary | ICD-10-CM | POA: Diagnosis not present

## 2021-02-18 DIAGNOSIS — L814 Other melanin hyperpigmentation: Secondary | ICD-10-CM | POA: Diagnosis not present

## 2021-02-18 DIAGNOSIS — L570 Actinic keratosis: Secondary | ICD-10-CM | POA: Diagnosis not present

## 2021-02-18 DIAGNOSIS — L821 Other seborrheic keratosis: Secondary | ICD-10-CM | POA: Diagnosis not present

## 2021-02-18 DIAGNOSIS — B351 Tinea unguium: Secondary | ICD-10-CM | POA: Diagnosis not present

## 2021-02-18 DIAGNOSIS — L304 Erythema intertrigo: Secondary | ICD-10-CM | POA: Diagnosis not present

## 2021-02-18 DIAGNOSIS — L538 Other specified erythematous conditions: Secondary | ICD-10-CM | POA: Diagnosis not present

## 2021-02-18 DIAGNOSIS — D225 Melanocytic nevi of trunk: Secondary | ICD-10-CM | POA: Diagnosis not present

## 2021-02-18 DIAGNOSIS — L298 Other pruritus: Secondary | ICD-10-CM | POA: Diagnosis not present

## 2021-02-18 DIAGNOSIS — L820 Inflamed seborrheic keratosis: Secondary | ICD-10-CM | POA: Diagnosis not present

## 2021-02-18 DIAGNOSIS — H53459 Other localized visual field defect, unspecified eye: Secondary | ICD-10-CM | POA: Diagnosis not present

## 2021-02-19 ENCOUNTER — Ambulatory Visit (INDEPENDENT_AMBULATORY_CARE_PROVIDER_SITE_OTHER): Payer: Medicare Other | Admitting: *Deleted

## 2021-02-19 ENCOUNTER — Other Ambulatory Visit: Payer: Self-pay

## 2021-02-19 DIAGNOSIS — I480 Paroxysmal atrial fibrillation: Secondary | ICD-10-CM

## 2021-02-19 DIAGNOSIS — Z5181 Encounter for therapeutic drug level monitoring: Secondary | ICD-10-CM | POA: Diagnosis not present

## 2021-02-19 LAB — POCT INR: INR: 3.2 — AB (ref 2.0–3.0)

## 2021-02-19 NOTE — Patient Instructions (Signed)
Description   Hold warfarin today then continue taking warfarin 1.5 tablets daily. Recheck INR in 3 weeks. Coumadin Clinic [phone]

## 2021-03-15 ENCOUNTER — Ambulatory Visit (INDEPENDENT_AMBULATORY_CARE_PROVIDER_SITE_OTHER): Payer: Medicare Other

## 2021-03-15 ENCOUNTER — Ambulatory Visit: Payer: Medicare Other | Admitting: Interventional Cardiology

## 2021-03-15 ENCOUNTER — Other Ambulatory Visit: Payer: Self-pay

## 2021-03-15 DIAGNOSIS — Z5181 Encounter for therapeutic drug level monitoring: Secondary | ICD-10-CM | POA: Diagnosis not present

## 2021-03-15 DIAGNOSIS — I480 Paroxysmal atrial fibrillation: Secondary | ICD-10-CM | POA: Diagnosis not present

## 2021-03-15 LAB — POCT INR: INR: 2 (ref 2.0–3.0)

## 2021-03-15 NOTE — Patient Instructions (Signed)
continue taking warfarin 1.5 tablets daily. Recheck INR in 6 weeks. Cou[facility]in Clinic [phone]

## 2021-03-22 DIAGNOSIS — I10 Essential (primary) hypertension: Secondary | ICD-10-CM | POA: Diagnosis not present

## 2021-03-22 DIAGNOSIS — E785 Hyperlipidemia, unspecified: Secondary | ICD-10-CM | POA: Diagnosis not present

## 2021-03-22 DIAGNOSIS — M8580 Other specified disorders of bone density and structure, unspecified site: Secondary | ICD-10-CM | POA: Diagnosis not present

## 2021-03-22 DIAGNOSIS — E039 Hypothyroidism, unspecified: Secondary | ICD-10-CM | POA: Diagnosis not present

## 2021-03-22 DIAGNOSIS — F418 Other specified anxiety disorders: Secondary | ICD-10-CM | POA: Diagnosis not present

## 2021-03-22 DIAGNOSIS — I4891 Unspecified atrial fibrillation: Secondary | ICD-10-CM | POA: Diagnosis not present

## 2021-03-22 DIAGNOSIS — Z0000 Encounter for general adult medical examination without abnormal findings: Secondary | ICD-10-CM | POA: Diagnosis not present

## 2021-03-22 DIAGNOSIS — G459 Transient cerebral ischemic attack, unspecified: Secondary | ICD-10-CM | POA: Diagnosis not present

## 2021-03-22 DIAGNOSIS — D0361 Melanoma in situ of right upper limb, including shoulder: Secondary | ICD-10-CM | POA: Diagnosis not present

## 2021-03-22 DIAGNOSIS — Z7901 Long term (current) use of anticoagulants: Secondary | ICD-10-CM | POA: Diagnosis not present

## 2021-03-22 DIAGNOSIS — I495 Sick sinus syndrome: Secondary | ICD-10-CM | POA: Diagnosis not present

## 2021-04-22 ENCOUNTER — Ambulatory Visit (INDEPENDENT_AMBULATORY_CARE_PROVIDER_SITE_OTHER): Payer: Medicare Other

## 2021-04-22 DIAGNOSIS — I495 Sick sinus syndrome: Secondary | ICD-10-CM | POA: Diagnosis not present

## 2021-04-22 LAB — CUP PACEART REMOTE DEVICE CHECK
Battery Remaining Longevity: 110 mo
Battery Voltage: 3 V
Brady Statistic AP VP Percent: 0.19 %
Brady Statistic AP VS Percent: 98.33 %
Brady Statistic AS VP Percent: 0 %
Brady Statistic AS VS Percent: 1.49 %
Brady Statistic RA Percent Paced: 98.53 %
Brady Statistic RV Percent Paced: 0.19 %
Date Time Interrogation Session: 20230116015153
Implantable Lead Implant Date: 20180914
Implantable Lead Implant Date: 20180914
Implantable Lead Location: 753859
Implantable Lead Location: 753860
Implantable Lead Model: 5076
Implantable Lead Model: 5076
Implantable Pulse Generator Implant Date: 20180914
Lead Channel Impedance Value: 323 Ohm
Lead Channel Impedance Value: 399 Ohm
Lead Channel Impedance Value: 437 Ohm
Lead Channel Impedance Value: 513 Ohm
Lead Channel Pacing Threshold Amplitude: 0.5 V
Lead Channel Pacing Threshold Amplitude: 1 V
Lead Channel Pacing Threshold Pulse Width: 0.4 ms
Lead Channel Pacing Threshold Pulse Width: 0.4 ms
Lead Channel Sensing Intrinsic Amplitude: 11.625 mV
Lead Channel Sensing Intrinsic Amplitude: 11.625 mV
Lead Channel Sensing Intrinsic Amplitude: 2.75 mV
Lead Channel Sensing Intrinsic Amplitude: 2.75 mV
Lead Channel Setting Pacing Amplitude: 1.5 V
Lead Channel Setting Pacing Amplitude: 2.5 V
Lead Channel Setting Pacing Pulse Width: 0.4 ms
Lead Channel Setting Sensing Sensitivity: 0.9 mV

## 2021-04-26 ENCOUNTER — Ambulatory Visit (INDEPENDENT_AMBULATORY_CARE_PROVIDER_SITE_OTHER): Payer: Medicare Other

## 2021-04-26 ENCOUNTER — Other Ambulatory Visit: Payer: Self-pay

## 2021-04-26 DIAGNOSIS — I480 Paroxysmal atrial fibrillation: Secondary | ICD-10-CM | POA: Diagnosis not present

## 2021-04-26 DIAGNOSIS — Z5181 Encounter for therapeutic drug level monitoring: Secondary | ICD-10-CM | POA: Diagnosis not present

## 2021-04-26 LAB — POCT INR: INR: 2.1 (ref 2.0–3.0)

## 2021-04-26 NOTE — Patient Instructions (Signed)
continue taking warfarin 1.5 tablets daily. Recheck INR in 6 weeks. Coumadin Clinic [phone]

## 2021-04-28 ENCOUNTER — Other Ambulatory Visit: Payer: Self-pay | Admitting: Internal Medicine

## 2021-04-28 DIAGNOSIS — I480 Paroxysmal atrial fibrillation: Secondary | ICD-10-CM

## 2021-04-29 NOTE — Telephone Encounter (Signed)
Prescription refill request received for warfarin Lov: 05/28/2020, [facility] Next INR check: 3/3 Warfarin tablet strength: 2.5 mg

## 2021-05-02 NOTE — Progress Notes (Signed)
Remote pacemaker transmission.   

## 2021-05-31 ENCOUNTER — Other Ambulatory Visit: Payer: Self-pay

## 2021-05-31 ENCOUNTER — Encounter: Payer: Self-pay | Admitting: Physician Assistant

## 2021-05-31 ENCOUNTER — Ambulatory Visit (INDEPENDENT_AMBULATORY_CARE_PROVIDER_SITE_OTHER): Payer: Medicare Other | Admitting: Physician Assistant

## 2021-05-31 VITALS — BP 106/80 | HR 65 | Ht 65.0 in | Wt 154.0 lb

## 2021-05-31 DIAGNOSIS — Z950 Presence of cardiac pacemaker: Secondary | ICD-10-CM | POA: Diagnosis not present

## 2021-05-31 DIAGNOSIS — I495 Sick sinus syndrome: Secondary | ICD-10-CM | POA: Diagnosis not present

## 2021-05-31 DIAGNOSIS — I480 Paroxysmal atrial fibrillation: Secondary | ICD-10-CM | POA: Diagnosis not present

## 2021-05-31 LAB — CUP PACEART INCLINIC DEVICE CHECK
Battery Remaining Longevity: 110 mo
Battery Voltage: 3 V
Brady Statistic AP VP Percent: 0.38 %
Brady Statistic AP VS Percent: 97.19 %
Brady Statistic AS VP Percent: 0 %
Brady Statistic AS VS Percent: 2.43 %
Brady Statistic RA Percent Paced: 97.73 %
Brady Statistic RV Percent Paced: 0.38 %
Date Time Interrogation Session: 20230224181734
Implantable Lead Implant Date: 20180914
Implantable Lead Implant Date: 20180914
Implantable Lead Location: 753859
Implantable Lead Location: 753860
Implantable Lead Model: 5076
Implantable Lead Model: 5076
Implantable Pulse Generator Implant Date: 20180914
Lead Channel Impedance Value: 342 Ohm
Lead Channel Impedance Value: 418 Ohm
Lead Channel Impedance Value: 475 Ohm
Lead Channel Impedance Value: 570 Ohm
Lead Channel Pacing Threshold Amplitude: 0.625 V
Lead Channel Pacing Threshold Amplitude: 1 V
Lead Channel Pacing Threshold Pulse Width: 0.4 ms
Lead Channel Pacing Threshold Pulse Width: 0.4 ms
Lead Channel Sensing Intrinsic Amplitude: 11.375 mV
Lead Channel Sensing Intrinsic Amplitude: 14.125 mV
Lead Channel Sensing Intrinsic Amplitude: 2.875 mV
Lead Channel Sensing Intrinsic Amplitude: 2.875 mV
Lead Channel Setting Pacing Amplitude: 1.5 V
Lead Channel Setting Pacing Amplitude: 2.5 V
Lead Channel Setting Pacing Pulse Width: 0.4 ms
Lead Channel Setting Sensing Sensitivity: 0.9 mV

## 2021-05-31 NOTE — Progress Notes (Addendum)
Cardiology Office Note Date:  05/31/2021  Patient ID:  [name] [birth year], MRN [id number] PCP:  Aura Dials, MD  Cardiologist:  Dr. Tamala Julian Electrophysiologist: Dr. Rayann Heman    Chief Complaint:  annual visit  History of Present Illness: [name] is a 76 y.o. female with history of anxiety, COPD, deaf (R ear), stroke, HTN, HLD, hypothyroidism, Afib, tachy-brady w/PPM.  She comes today to be seen for Dr. Rayann Heman, last seen by him 2018 >> annual visits with ALynnell Jude since then, last jan 2021, doing well, no changes were made.  She saw Dr. Tamala Julian oct 2021, unfortunately increased personal stressors and some increased palpitations that she was using an Prn 1/3 tab of her metoprolol for.  She arrived in AFib though had spontaneous conversion while there. Planned for monitoring to guide meds. (not done) Via phone notes advised to take metoprolol 12.5mg  daily (seems 2/2 palpitations)   I saw her 05/28/20 She feels the daily metoprolol has very well controlled her palpitations. These were frequent but fleeting episodes of fast beats that were on/off all day before she went to daily dosing BB. Since then, has had none. She denies CP, but does admit to feeling like she is getting unusually winded with exertion. No rest SOB, no nocturnal or positional SOB She of late has been doing quite a lot around the house and is very active, but in the last 33mo feels like she will have to pace herself. Mentions a few months ago her son asked her why she was so SOB. No near syncope or syncope, but has had a couple lightheaded spells upon standing Urged better oral hydration, planned for updated echo Low AF burden, normal device function  TTE with preserved LVEF, 55-60%, mod MR  Saw cardiology, C.Gilford Rile, NP June 2022, minimal palpitations with stress, recent lipoma surgery, planned to refer to CTS for evaluation/monitoring of her thoracic AO aneurysm, update  labs 09/17/20, CT chest with 4cm ascending thoracic Ao aneurysm   TODAY She is doing much better emotionally, on Wellbutrin and Zoloft  No CP, palpitations or cardiac awareness No SOB No near syncope or syncope. No bleeding or signs of bleeding   Device information MDT dual chamber PPM, implanted 2018  AFib Hx diagnosis goes as far back as her Epic chart (2011)  AAD Hx Flecainide started 2018 BID eventually pill in pocket at last EP visit Jan 2011 pt states taken off her list because she had not used it in years   Past Medical History:  Diagnosis Date   Acoustic neuroma ([place])    Hx of right ear acoustic neuroma, removed in 1999. No hearing in right ear.   Anxiety    Hx of, responds to Wellbutrin   Basal cell carcinoma    s/p MOHS surgery in 03/2011   Breast cancer ([place]) 2021   left lumpectomy   Cataract    Chronic anticoagulation 11/24/2016   Chronic cholecystitis with calculus 05/13/2013   Closed fracture of left lateral malleolus 08/10/2017   COPD (chronic obstructive pulmonary disease) ([place])    Deafness in right ear    Embolic stroke ([place]) 40/98/1191   Family history of adverse reaction to anesthesia    Family hx of PONV   Fracture    left lateral malleolus fracture   GERD (gastroesophageal reflux disease)    Headache(784.0)    MIGRAINES   Hearing loss    Only in right ear, from an acoustic neuroma. S/P removal in 1999.  History of hiatal hernia    History of neck pain    Responds to Flexeril   History of shingles    Hypercholesteremia    Hypertension    Hypothyroidism    Melanoma ([place]) 2018   right arm-surgery only   PAF (paroxysmal atrial fibrillation) ([place])    2 brief episodes of Afib recorded on monitor 02/2007   Paroxysmal atrial fibrillation (HCC)    PONV (postoperative nausea and vomiting)    Presence of permanent cardiac pacemaker 2018   for A-fib   Sick sinus syndrome ([place]) 12/19/2016   Sinus bradycardia    Tachycardia-bradycardia  syndrome ([place]) 11/24/2016   TIA (transient ischemic attack) 2011   On coumadin. Asymptomatic, without recurrence.    Past Surgical History:  Procedure Laterality Date   ABDOMINAL HYSTERECTOMY     APPENDECTOMY     BREAST BIOPSY     X 3   BREAST CYST ASPIRATION Left 50 yrs ago per pt   BREAST EXCISIONAL BIOPSY Left 50 yrs ago   BREAST LUMPECTOMY WITH RADIOACTIVE SEED LOCALIZATION Left 06/21/2019   Procedure: LEFT BREAST LUMPECTOMY WITH RADIOACTIVE SEED LOCALIZATION;  Surgeon: Donnie Mesa, MD;  Location: [facility];  Service: General;  Laterality: Left;   CHOLECYSTECTOMY N/A 05/26/2013   Procedure: LAPAROSCOPIC CHOLECYSTECTOMY WITH INTRAOPERATIVE CHOLANGIOGRAM;  Surgeon: Imogene Burn. Georgette Dover, MD;  Location: [facility];  Service: General;  Laterality: N/A;   CRANIECTOMY FOR EXCISION OF ACOUSTIC NEUROMA Right 1999   INTRAOCULAR LENS INSERTION     Dr. Talbert Forest   MOHS SURGERY  03/2011   Basal cell skin cancer   ORIF ANKLE FRACTURE Left 08/10/2017   Procedure: OPEN REDUCTION INTERNAL FIXATION (ORIF) LEFT ANKLE FRACTURE;  Surgeon: Rod Can, MD;  Location: [facility];  Service: Orthopedics;  Laterality: Left;   PACEMAKER IMPLANT N/A 12/19/2016   Medtronic Azure XT MRI conditional dual-chamber pacemaker for symptomatic sinus bradycardia by Dr Rayann Heman   TONSILLECTOMY      Current Outpatient Medications  Medication Sig Dispense Refill   Cholecalciferol (VITAMIN D3 PO) Take by mouth.     clonazePAM (KLONOPIN) 0.5 MG tablet Take 0.5 mg by mouth daily.     enoxaparin (LOVENOX) 60 MG/0.6ML injection Inject 0.6 mLs (60 mg total) into the skin every 12 (twelve) hours. 12 mL 1   Evolocumab (REPATHA SURECLICK) 774 MG/ML SOAJ INJECT CONTENTS OF 1 PEN SUBCUTANEOUSLY EVERY 14 DAYS. 2 mL 11   fluticasone (FLONASE) 50 MCG/ACT nasal spray Place 1 spray into both nostrils daily.     JUBLIA 10 % SOLN apply to affected nails qd     levothyroxine (SYNTHROID, LEVOTHROID) 88 MCG tablet Take 88 mcg by mouth  daily before breakfast.     loratadine (CLARITIN) 10 MG tablet Take 10 mg by mouth daily as needed for allergies.     metoprolol succinate (TOPROL XL) 25 MG 24 hr tablet Take 0.5 tablets (12.5 mg total) by mouth daily. 45 tablet 0   pantoprazole (PROTONIX) 40 MG tablet Take 40 mg by mouth daily as needed (acid reflux).     vitamin B-12 (CYANOCOBALAMIN) 1000 MCG tablet 2 tablets     warfarin (COUMADIN) 2.5 MG tablet TAKE 1-2 TABLETS BY MOUTH DAILY AS DIRECTED BY COUMADIN CLINIC. 170 tablet 0   No current facility-administered medications for this visit.    Allergies:   Atorvastatin, Statins, Ezetimibe, Rosuvastatin, Codeine, Latex, and Penicillins   Social History:  The patient  reports that she has never smoked. She has never used smokeless tobacco.  She reports current alcohol use. She reports that she does not use drugs.   Family History:  The patient's family history includes Alcoholism in her father; Brain cancer in her maternal aunt; Breast cancer in her maternal aunt; CVA in an other family member; Cancer in her maternal grandmother; Diabetes in her mother; Healthy in her sister; Heart disease in an other family member; High Cholesterol in her mother; Hyperlipidemia in an other family member; Hypertension in her mother and another family member; Liver cancer in her father; Memory loss in her mother.  ROS:  Please see the history of present illness.    All other systems are reviewed and otherwise negative.   PHYSICAL EXAM:  VS:  There were no vitals taken for this visit. BMI: There is no height or weight on file to calculate BMI. Well nourished, well developed, in no acute distress HEENT: normocephalic, atraumatic Neck: no JVD, carotid bruits or masses Cardiac:  RRR; no significant murmurs, no rubs, or gallops Lungs:  CTA b/l, no wheezing, rhonchi or rales Abd: soft, nontender MS: no deformity or Atrophy Ext: no edema Skin: warm and dry, no rash Neuro:  No gross deficits  appreciated Psych: euthymic mood, full affect  PPM site is stable, no tethering or discomfort   EKG:  done today and reviewed by myself AP/VS 72bpm  Device interrogation done today and reviewed by myself:  Battery and lead measurements are good VT, fast AV events reviewed All available EGM have the same morphology, longest 25min 32sec All are brief SVTs One AFib episode 64min 32sec  06/25/20; TTE 1. Left ventricular ejection fraction, by estimation, is 55 to 60%. The  left ventricle has normal function. The left ventricle has no regional  wall motion abnormalities. There is mild asymmetric left ventricular  hypertrophy of the basal and septal  segments. Left ventricular diastolic parameters are indeterminate. The  average left ventricular global longitudinal strain is -17.4 %.   2. Pacing wires in RA/RV . Right ventricular systolic function is normal.  The right ventricular size is normal. There is normal pulmonary artery  systolic pressure.   3. Left atrial size was moderately dilated.   4. The mitral valve is normal in structure. Moderate mitral valve  regurgitation. No evidence of mitral stenosis.   5. The aortic valve was not well visualized. There is moderate  calcification of the aortic valve. There is moderate thickening of the  aortic valve. Aortic valve regurgitation is mild. Mild to moderate aortic  valve sclerosis/calcification is present,  without any evidence of aortic stenosis.   6. The inferior vena cava is normal in size with greater than 50%  respiratory variability, suggesting right atrial pressure of 3 mmHg.   12/11/2016: stress myoview Clinically and electrically negative for ischemia Normal perfusion. No ischemia or scar This is a low risk study. LVEF is 62%   12/02/2016: TTE Study Conclusions  - Left ventricle: The cavity size was normal. Wall thickness was    normal. Systolic function was normal. The estimated ejection    fraction was in the range of  55% to 60%. Wall motion was normal;    there were no regional wall motion abnormalities. Features are    consistent with a pseudonormal left ventricular filling pattern,    with concomitant abnormal relaxation and increased filling    pressure (grade 2 diastolic dysfunction).  - Aortic valve: There was no stenosis.  - Mitral valve: Mildly calcified annulus. There was mild    regurgitation.  -  Left atrium: The atrium was mildly to moderately dilated.  - Right ventricle: The cavity size was normal. Systolic function    was normal.  - Right atrium: The atrium was mildly dilated.  - Tricuspid valve: Peak RV-RA gradient (S): 27 mm Hg.  - Pulmonary arteries: PA peak pressure: 30 mm Hg (S).  - Inferior vena cava: The vessel was normal in size. The    respirophasic diameter changes were in the normal range (>= 50%),    consistent with normal central venous pressure.   Impressions:  - Normal LV size with EF 55-60%. Moderate diastolic dysfunction.    Normal RV size and systolic function. Mild mitral regurgitation.     Recent Labs: 09/27/2020: BUN 14; Creatinine, Ser 0.99; Potassium 5.1; Sodium 142 12/07/2020: ALT 13  12/07/2020: Chol/HDL Ratio 4.3; Cholesterol, Total 222; HDL 52; LDL Chol Calc (NIH) 143; Triglycerides 150   CrCl cannot be calculated (Patient's most recent lab result is older than the maximum 21 days allowed.).   Wt Readings from Last 3 Encounters:  10/25/20 148 lb (67.1 kg)  09/27/20 149 lb (67.6 kg)  05/28/20 152 lb 6.4 oz (69.1 kg)     Other studies reviewed: Additional studies/records reviewed today include: summarized above  ASSESSMENT AND PLAN:  1. PPM     Intact function     No programming changes made  2. paroxysmal Afib     AT     CHA2DS2Vasc is 6,  On warfarin     <0.1%            Disposition: remotes as usual, in clinic with EP in a year, sooner if needed.  She will transition to Dr. Curt Bears   Current medicines are reviewed at length with the  patient today.  The patient did not have any concerns regarding medicines.  Venetia Night, PA-C 05/31/2021 6:00 AM     [facility] [address] [place] [address] [phone] (office)  [phone] (fax)

## 2021-05-31 NOTE — Patient Instructions (Signed)

## 2021-06-07 ENCOUNTER — Ambulatory Visit (INDEPENDENT_AMBULATORY_CARE_PROVIDER_SITE_OTHER): Payer: Medicare Other

## 2021-06-07 ENCOUNTER — Other Ambulatory Visit: Payer: Self-pay

## 2021-06-07 DIAGNOSIS — I480 Paroxysmal atrial fibrillation: Secondary | ICD-10-CM | POA: Diagnosis not present

## 2021-06-07 DIAGNOSIS — Z5181 Encounter for therapeutic drug level monitoring: Secondary | ICD-10-CM | POA: Diagnosis not present

## 2021-06-07 LAB — POCT INR: INR: 2.3 (ref 2.0–3.0)

## 2021-06-07 NOTE — Patient Instructions (Signed)
continue taking warfarin 1.5 tablets daily. Recheck INR in 6 weeks. Coumadin Clinic [phone] ?

## 2021-06-27 ENCOUNTER — Other Ambulatory Visit: Payer: Self-pay | Admitting: Interventional Cardiology

## 2021-07-03 DIAGNOSIS — J0100 Acute maxillary sinusitis, unspecified: Secondary | ICD-10-CM | POA: Diagnosis not present

## 2021-07-04 ENCOUNTER — Telehealth: Payer: Self-pay | Admitting: Pharmacist Clinician (PhC)/ Clinical Pharmacy Specialist

## 2021-07-04 NOTE — Telephone Encounter (Signed)
Patient called, concerned because she forgot her dose of warfarin last night.  Had started Bactrim DS for sinus infection yesterday afternoon as well.  Advised not to make up dose.  Normal dose 3.75 mg daily.  Advised to take normal dose today (Thursday), the n2.5 mg Friday and Saturday, followed by normal dose Sunday.  Will see Legrand Como in [place] Monday at 1:45 ? ?

## 2021-07-08 ENCOUNTER — Ambulatory Visit (INDEPENDENT_AMBULATORY_CARE_PROVIDER_SITE_OTHER): Payer: Medicare Other

## 2021-07-08 DIAGNOSIS — I480 Paroxysmal atrial fibrillation: Secondary | ICD-10-CM

## 2021-07-08 DIAGNOSIS — Z5181 Encounter for therapeutic drug level monitoring: Secondary | ICD-10-CM

## 2021-07-08 LAB — POCT INR: INR: 3.6 — AB (ref 2.0–3.0)

## 2021-07-08 NOTE — Patient Instructions (Signed)
HOLD TONIGHT ONLY and then take 1 tablet daily until Saturday. continue taking warfarin 1.5 tablets daily. Recheck INR in 1 week. Coumadin Clinic [phone]; Bactrim finished 4/8. ?

## 2021-07-17 DIAGNOSIS — Z20822 Contact with and (suspected) exposure to covid-19: Secondary | ICD-10-CM | POA: Diagnosis not present

## 2021-07-19 ENCOUNTER — Ambulatory Visit (INDEPENDENT_AMBULATORY_CARE_PROVIDER_SITE_OTHER): Payer: Medicare Other

## 2021-07-19 DIAGNOSIS — I480 Paroxysmal atrial fibrillation: Secondary | ICD-10-CM | POA: Diagnosis not present

## 2021-07-19 DIAGNOSIS — Z5181 Encounter for therapeutic drug level monitoring: Secondary | ICD-10-CM | POA: Diagnosis not present

## 2021-07-19 LAB — POCT INR: INR: 2.9 (ref 2.0–3.0)

## 2021-07-19 NOTE — Patient Instructions (Signed)
continue taking warfarin 1.5 tablets daily. Recheck INR in 4 weeks. Coumadin Clinic [phone]; ?

## 2021-07-22 ENCOUNTER — Ambulatory Visit (INDEPENDENT_AMBULATORY_CARE_PROVIDER_SITE_OTHER): Payer: Medicare Other

## 2021-07-22 DIAGNOSIS — I495 Sick sinus syndrome: Secondary | ICD-10-CM

## 2021-07-23 LAB — CUP PACEART REMOTE DEVICE CHECK
Battery Remaining Longevity: 108 mo
Battery Voltage: 2.99 V
Brady Statistic AP VP Percent: 0.31 %
Brady Statistic AP VS Percent: 96.56 %
Brady Statistic AS VP Percent: 0 %
Brady Statistic AS VS Percent: 3.13 %
Brady Statistic RA Percent Paced: 97.54 %
Brady Statistic RV Percent Paced: 0.32 %
Date Time Interrogation Session: 20230416223335
Implantable Lead Implant Date: 20180914
Implantable Lead Implant Date: 20180914
Implantable Lead Location: 753859
Implantable Lead Location: 753860
Implantable Lead Model: 5076
Implantable Lead Model: 5076
Implantable Pulse Generator Implant Date: 20180914
Lead Channel Impedance Value: 323 Ohm
Lead Channel Impedance Value: 399 Ohm
Lead Channel Impedance Value: 399 Ohm
Lead Channel Impedance Value: 494 Ohm
Lead Channel Pacing Threshold Amplitude: 0.625 V
Lead Channel Pacing Threshold Amplitude: 1 V
Lead Channel Pacing Threshold Pulse Width: 0.4 ms
Lead Channel Pacing Threshold Pulse Width: 0.4 ms
Lead Channel Sensing Intrinsic Amplitude: 12 mV
Lead Channel Sensing Intrinsic Amplitude: 12 mV
Lead Channel Sensing Intrinsic Amplitude: 2 mV
Lead Channel Sensing Intrinsic Amplitude: 2 mV
Lead Channel Setting Pacing Amplitude: 1.5 V
Lead Channel Setting Pacing Amplitude: 2.5 V
Lead Channel Setting Pacing Pulse Width: 0.4 ms
Lead Channel Setting Sensing Sensitivity: 0.9 mV

## 2021-08-07 NOTE — Progress Notes (Signed)
Remote pacemaker transmission.   

## 2021-08-11 NOTE — Progress Notes (Deleted)
Cardiology Office Note:    Date:  08/11/2021   ID:  [name], DOB 1945-07-10, MRN [id number]  PCP:  Aura Dials, MD  Cardiologist:  Sinclair Grooms, MD   Referring MD: Aura Dials, MD   No chief complaint on file.   History of Present Illness:    [name] is a 76 y.o. female with a hx of paroxysmal atrial fibrillation, embolic CVA which led to the diagnosis of paroxysmal atrial fibrillation, chronic Coumadin anticoagulation, subsequent development of tachycardia bradycardia syndrome, ultimately treated with DDD pacemaker 12/20/2016.   ***  Past Medical History:  Diagnosis Date   Acoustic neuroma ([place])    Hx of right ear acoustic neuroma, removed in 1999. No hearing in right ear.   Anxiety    Hx of, responds to Wellbutrin   Basal cell carcinoma    s/p MOHS surgery in 03/2011   Breast cancer ([place]) 2021   left lumpectomy   Cataract    Chronic anticoagulation 11/24/2016   Chronic cholecystitis with calculus 05/13/2013   Closed fracture of left lateral malleolus 08/10/2017   COPD (chronic obstructive pulmonary disease) ([place])    Deafness in right ear    Embolic stroke ([place]) 35/45/6256   Family history of adverse reaction to anesthesia    Family hx of PONV   Fracture    left lateral malleolus fracture   GERD (gastroesophageal reflux disease)    Headache(784.0)    MIGRAINES   Hearing loss    Only in right ear, from an acoustic neuroma. S/P removal in 1999.    History of hiatal hernia    History of neck pain    Responds to Flexeril   History of shingles    Hypercholesteremia    Hypertension    Hypothyroidism    Melanoma ([place]) 2018   right arm-surgery only   PAF (paroxysmal atrial fibrillation) ([place])    2 brief episodes of Afib recorded on monitor 02/2007   Paroxysmal atrial fibrillation (H[facility])    PONV (postoperative nausea and vomiting)    Presence of permanent cardiac pacemaker 2018   for A-fib   Sick sinus syndrome ([place])  12/19/2016   Sinus bradycardia    Tachycardia-bradycardia syndrome ([place]) 11/24/2016   TIA (transient ischemic attack) 2011   On coumadin. Asymptomatic, without recurrence.    Past Surgical History:  Procedure Laterality Date   ABDOMINAL HYSTERECTOMY     APPENDECTOMY     BREAST BIOPSY     X 3   BREAST CYST ASPIRATION Left 50 yrs ago per pt   BREAST EXCISIONAL BIOPSY Left 50 yrs ago   BREAST LUMPECTOMY WITH RADIOACTIVE SEED LOCALIZATION Left 06/21/2019   Procedure: LEFT BREAST LUMPECTOMY WITH RADIOACTIVE SEED LOCALIZATION;  Surgeon: Donnie Mesa, MD;  Location: [facility];  Service: General;  Laterality: Left;   CHOLECYSTECTOMY N/A 05/26/2013   Procedure: LAPAROSCOPIC CHOLECYSTECTOMY WITH INTRAOPERATIVE CHOLANGIOGRAM;  Surgeon: Imogene Burn. Georgette Dover, MD;  Location: [facility];  Service: General;  Laterality: N/A;   CRANIECTOMY FOR EXCISION OF ACOUSTIC NEUROMA Right 1999   INTRAOCULAR LENS INSERTION     Dr. Talbert Forest   MOHS SURGERY  03/2011   Basal cell skin cancer   ORIF ANKLE FRACTURE Left 08/10/2017   Procedure: OPEN REDUCTION INTERNAL FIXATION (ORIF) LEFT ANKLE FRACTURE;  Surgeon: Rod Can, MD;  Location: [facility];  Service: Orthopedics;  Laterality: Left;   PACEMAKER IMPLANT N/A 12/19/2016   Medtronic Azure XT MRI conditional dual-chamber pacemaker for symptomatic sinus bradycardia by  Dr Rayann Heman   TONSILLECTOMY      Current Medications: No outpatient medications have been marked as taking for the 08/15/21 encounter (Appointment) with Belva Crome, MD.     Allergies:   Atorvastatin, Statins, Codeine, Ezetimibe, Latex, Penicillins, and Rosuvastatin   Social History   Socioeconomic History   Marital status: Married    Spouse name: [name]   Number of children: 3   Years of education: masters   Highest education level: Not on file  Occupational History   Occupation: retired    Comment: [employer]  Tobacco Use   Smoking status: Never   Smokeless tobacco: Never  Vaping  Use   Vaping Use: Never used  Substance and Sexual Activity   Alcohol use: Yes    Comment: Rare   Drug use: No   Sexual activity: Not Currently    Birth control/protection: Surgical  Other Topics Concern   Not on file  Social History Narrative   Pt lives with spouse.    Caffeine Use: 1-2 glasses per week.   Social Determinants of Health   Financial Resource Strain: Not on file  Food Insecurity: Not on file  Transportation Needs: Not on file  Physical Activity: Not on file  Stress: Not on file  Social Connections: Not on file     Family History: The patient's family history includes Alcoholism in her father; Brain cancer in her maternal aunt; Breast cancer in her maternal aunt; CVA in an other family member; Cancer in her maternal grandmother; Diabetes in her mother; Healthy in her sister; Heart disease in an other family member; High Cholesterol in her mother; Hyperlipidemia in an other family member; Hypertension in her mother and another family member; Liver cancer in her father; Memory loss in her mother.  ROS:   Please see the history of present illness.    *** All other systems reviewed and are negative.  EKGs/Labs/Other Studies Reviewed:    The following studies were reviewed today:  ECHOCARDIOGRAM 2022 IMPRESSIONS   1. Left ventricular ejection fraction, by estimation, is 55 to 60%. The  left ventricle has normal function. The left ventricle has no regional  wall motion abnormalities. There is mild asymmetric left ventricular  hypertrophy of the basal and septal  segments. Left ventricular diastolic parameters are indeterminate. The  average left ventricular global longitudinal strain is -17.4 %.   2. Pacing wires in RA/RV . Right ventricular systolic function is normal.  The right ventricular size is normal. There is normal pulmonary artery  systolic pressure.   3. Left atrial size was moderately dilated.   4. The mitral valve is normal in structure. Moderate  mitral valve  regurgitation. No evidence of mitral stenosis.   5. The aortic valve was not well visualized. There is moderate  calcification of the aortic valve. There is moderate thickening of the  aortic valve. Aortic valve regurgitation is mild. Mild to moderate aortic  valve sclerosis/calcification is present,  without any evidence of aortic stenosis.   6. The inferior vena cava is normal in size with greater than 50%  respiratory variability, suggesting right atrial pressure of 3 mmHg.    EKG:  EKG ***  Recent Labs: 09/27/2020: BUN 14; Creatinine, Ser 0.99; Potassium 5.1; Sodium 142 12/07/2020: ALT 13  Recent Lipid Panel    Component Value Date/Time   CHOL 222 (H) 12/07/2020 0855   TRIG 150 (H) 12/07/2020 0855   HDL 52 12/07/2020 0855   CHOLHDL 4.3 12/07/2020 0855  CHOLHDL 4 12/26/2013 0824   VLDL 44.8 (H) 12/26/2013 0824   LDLCALC 143 (H) 12/07/2020 0855   LDLDIRECT 106.6 12/26/2013 0824    Physical Exam:    VS:  There were no vitals taken for this visit.    Wt Readings from Last 3 Encounters:  05/31/21 154 lb (69.9 kg)  10/25/20 148 lb (67.1 kg)  09/27/20 149 lb (67.6 kg)     GEN: ***. No acute distress HEENT: Normal NECK: No JVD. LYMPHATICS: No lymphadenopathy CARDIAC: *** murmur. RRR *** gallop, or edema. VASCULAR: *** Normal Pulses. No bruits. RESPIRATORY:  Clear to auscultation without rales, wheezing or rhonchi  ABDOMEN: Soft, non-tender, non-distended, No pulsatile mass, MUSCULOSKELETAL: No deformity  SKIN: Warm and dry NEUROLOGIC:  Alert and oriented x 3 PSYCHIATRIC:  Normal affect   ASSESSMENT:    1. Tachycardia-bradycardia syndrome ([place])   2. Paroxysmal atrial fibrillation ([place])   3. Hyperlipidemia LDL goal <70   4. Cardiac pacemaker in situ   5. Aneurysm of descending thoracic aorta without rupture (H[facility])   6. Chronic anticoagulation   7. Thoracic aortic aneurysm without rupture, unspecified part ([place])    PLAN:    In order of problems  listed above:  ***   Medication Adjustments/Labs and Tests Ordered: Current medicines are reviewed at length with the patient today.  Concerns regarding medicines are outlined above.  No orders of the defined types were placed in this encounter.  No orders of the defined types were placed in this encounter.   There are no Patient Instructions on file for this visit.   Signed, Sinclair Grooms, MD  08/11/2021 2:07 PM    [place]

## 2021-08-15 ENCOUNTER — Ambulatory Visit: Payer: Medicare Other | Admitting: Interventional Cardiology

## 2021-08-15 DIAGNOSIS — I7123 Aneurysm of the descending thoracic aorta, without rupture: Secondary | ICD-10-CM

## 2021-08-15 DIAGNOSIS — E785 Hyperlipidemia, unspecified: Secondary | ICD-10-CM

## 2021-08-15 DIAGNOSIS — I495 Sick sinus syndrome: Secondary | ICD-10-CM

## 2021-08-15 DIAGNOSIS — I480 Paroxysmal atrial fibrillation: Secondary | ICD-10-CM

## 2021-08-15 DIAGNOSIS — Z7901 Long term (current) use of anticoagulants: Secondary | ICD-10-CM

## 2021-08-15 DIAGNOSIS — I712 Thoracic aortic aneurysm, without rupture, unspecified: Secondary | ICD-10-CM

## 2021-08-15 DIAGNOSIS — Z950 Presence of cardiac pacemaker: Secondary | ICD-10-CM

## 2021-08-16 ENCOUNTER — Ambulatory Visit (INDEPENDENT_AMBULATORY_CARE_PROVIDER_SITE_OTHER): Payer: Medicare Other

## 2021-08-16 DIAGNOSIS — I480 Paroxysmal atrial fibrillation: Secondary | ICD-10-CM | POA: Diagnosis not present

## 2021-08-16 DIAGNOSIS — Z5181 Encounter for therapeutic drug level monitoring: Secondary | ICD-10-CM

## 2021-08-16 LAB — POCT INR: INR: 2.6 (ref 2.0–3.0)

## 2021-08-16 NOTE — Patient Instructions (Signed)
continue taking warfarin 1.5 tablets daily. Recheck INR in 6 weeks. Coumadin Clinic [phone]; ?

## 2021-08-18 DIAGNOSIS — S91012A Laceration without foreign body, left ankle, initial encounter: Secondary | ICD-10-CM | POA: Diagnosis not present

## 2021-08-27 ENCOUNTER — Emergency Department (HOSPITAL_COMMUNITY)
Admission: EM | Admit: 2021-08-27 | Discharge: 2021-08-27 | Disposition: A | Discharge status: Home / Self Care | Payer: Medicare Other | Attending: Emergency Medicine | Admitting: Emergency Medicine

## 2021-08-27 ENCOUNTER — Emergency Department (HOSPITAL_COMMUNITY): Payer: Medicare Other

## 2021-08-27 ENCOUNTER — Encounter (HOSPITAL_COMMUNITY): Payer: Self-pay | Admitting: Emergency Medicine

## 2021-08-27 ENCOUNTER — Other Ambulatory Visit: Payer: Self-pay

## 2021-08-27 DIAGNOSIS — Z91040 Latex allergy status: Secondary | ICD-10-CM | POA: Insufficient documentation

## 2021-08-27 DIAGNOSIS — S92902A Unspecified fracture of left foot, initial encounter for closed fracture: Secondary | ICD-10-CM | POA: Insufficient documentation

## 2021-08-27 DIAGNOSIS — S92302A Fracture of unspecified metatarsal bone(s), left foot, initial encounter for closed fracture: Secondary | ICD-10-CM | POA: Diagnosis not present

## 2021-08-27 DIAGNOSIS — S92309A Fracture of unspecified metatarsal bone(s), unspecified foot, initial encounter for closed fracture: Secondary | ICD-10-CM

## 2021-08-27 DIAGNOSIS — S92325A Nondisplaced fracture of second metatarsal bone, left foot, initial encounter for closed fracture: Secondary | ICD-10-CM | POA: Diagnosis not present

## 2021-08-27 DIAGNOSIS — M79672 Pain in left foot: Secondary | ICD-10-CM | POA: Insufficient documentation

## 2021-08-27 DIAGNOSIS — S92345A Nondisplaced fracture of fourth metatarsal bone, left foot, initial encounter for closed fracture: Secondary | ICD-10-CM | POA: Diagnosis not present

## 2021-08-27 DIAGNOSIS — W010XXA Fall on same level from slipping, tripping and stumbling without subsequent striking against object, initial encounter: Secondary | ICD-10-CM | POA: Insufficient documentation

## 2021-08-27 DIAGNOSIS — S92335A Nondisplaced fracture of third metatarsal bone, left foot, initial encounter for closed fracture: Secondary | ICD-10-CM | POA: Diagnosis not present

## 2021-08-27 DIAGNOSIS — S99922A Unspecified injury of left foot, initial encounter: Secondary | ICD-10-CM | POA: Diagnosis present

## 2021-08-27 DIAGNOSIS — M25572 Pain in left ankle and joints of left foot: Secondary | ICD-10-CM | POA: Diagnosis not present

## 2021-08-27 DIAGNOSIS — R609 Edema, unspecified: Secondary | ICD-10-CM | POA: Diagnosis not present

## 2021-08-27 DIAGNOSIS — S92352A Displaced fracture of fifth metatarsal bone, left foot, initial encounter for closed fracture: Secondary | ICD-10-CM | POA: Diagnosis not present

## 2021-08-27 DIAGNOSIS — R110 Nausea: Secondary | ICD-10-CM | POA: Diagnosis not present

## 2021-08-27 DIAGNOSIS — W19XXXA Unspecified fall, initial encounter: Secondary | ICD-10-CM | POA: Diagnosis not present

## 2021-08-27 MED ORDER — HYDROCODONE-ACETAMINOPHEN 5-325 MG PO TABS
1.0000 | ORAL_TABLET | Freq: Once | ORAL | Status: AC
  Administered 2021-08-27: 1 via ORAL
  Filled 2021-08-27: qty 1

## 2021-08-27 MED ORDER — HYDROCODONE-ACETAMINOPHEN 5-325 MG PO TABS: 1.0000 | ORAL_TABLET | Freq: Four times a day (QID) | ORAL | 0 refills | Status: DC | PRN

## 2021-08-27 MED ORDER — SODIUM CHLORIDE 0.9 % IV BOLUS
500.0000 mL | Freq: Once | INTRAVENOUS | Status: AC
  Administered 2021-08-27: 500 mL via INTRAVENOUS

## 2021-08-27 NOTE — ED Provider Notes (Signed)
[facility] DEPT Provider Note   CSN: [id number] Arrival date & time: 08/27/21  1650     History  Chief Complaint  Patient presents with   Foot Pain    Tayja Manzer is a 76 y.o. female who presents today for evaluation of a left foot and ankle injury. She states that she was outside trimming trees when she had a mechanical nonsyncopal slip and fall.  She denies striking her head.  She reports her only injury is her left foot and ankle.  She states that she was very nauseous after this.  She was given IV fluids and Zofran by EMS. She denies any pain in her chest, back, abdomen pelvis.  He does report that she previously injured this ankle in a similar fashion.  HPI     Home Medications Prior to Admission medications   Medication Sig Start Date End Date Taking? Authorizing Provider  HYDROcodone-acetaminophen (NORCO/VICODIN) 5-325 MG tablet Take 1 tablet by mouth every 6 (six) hours as needed for severe pain. 08/27/21  Yes Lorin Glass, PA-C  buPROPion (WELLBUTRIN XL) 150 MG 24 hr tablet Take 1 tablet by mouth daily. 05/13/21   [provider]  Cholecalciferol (VITAMIN D3 PO) Take by mouth.    [provider]  clonazePAM (KLONOPIN) 0.5 MG tablet Take 0.5 mg by mouth daily. 08/11/12   [provider]  fluticasone (FLONASE) 50 MCG/ACT nasal spray Place 1 spray into both nostrils daily. 07/06/19   [provider]  JUBLIA 10 % SOLN apply to affected nails qd 03/12/20   [provider]  levothyroxine (SYNTHROID, LEVOTHROID) 88 MCG tablet Take 88 mcg by mouth daily before breakfast.    [provider]  loratadine (CLARITIN) 10 MG tablet Take 10 mg by mouth daily as needed for allergies.    [provider]  metoprolol succinate (TOPROL XL) 25 MG 24 hr tablet Take 0.5 tablets (12.5 mg total) by mouth daily. 01/24/20   Belva Crome, MD  pantoprazole (PROTONIX) 40 MG tablet Take 40 mg  by mouth daily as needed (acid reflux).    [provider]  REPATHA SURECLICK 160 MG/ML SOAJ INJECT CONTENTS OF 1 PEN SUBCUTANEOUSLY EVERY 14 DAYS. 06/27/21   Belva Crome, MD  sertraline (ZOLOFT) 50 MG tablet Take 1 tablet by mouth daily. 05/23/21   [provider]  vitamin B-12 (CYANOCOBALAMIN) 1000 MCG tablet 2 tablets    [provider]  warfarin (COUMADIN) 2.5 MG tablet TAKE 1-2 TABLETS BY MOUTH DAILY AS DIRECTED BY COUMADIN CLINIC. 04/29/21   Allred, Jeneen Rinks, MD      Allergies    Atorvastatin, Statins, Codeine, Ezetimibe, Latex, Penicillins, and Rosuvastatin    Review of Systems   Review of Systems See above Physical Exam Updated Vital Signs BP 95/76   Pulse (!) 55   Temp 97.8 F (36.6 C) (Oral)   Resp 18   SpO2 98%  Physical Exam Vitals and nursing note reviewed.  Constitutional:      General: She is not in acute distress.    Appearance: She is not diaphoretic.  HENT:     Head: Normocephalic and atraumatic.  Eyes:     General: No scleral icterus.       Right eye: No discharge.        Left eye: No discharge.     Conjunctiva/sclera: Conjunctivae normal.  Cardiovascular:     Rate and Rhythm: Normal rate and regular rhythm.     Comments:  2+ left DP/PT pulses. Pulmonary:     Effort: Pulmonary effort is normal. No respiratory distress.     Breath sounds: No stridor.  Abdominal:     General: There is no distension.  Musculoskeletal:        General: No deformity.     Cervical back: Normal range of motion.     Comments: There is diffuse tenderness to palpation over the left flank.  Tenderness is mildly extended into the ankle.  There is no tenderness to palpation over the left proximal lower leg.  There is no obvious crepitus or deformity over left lower extremity.  Compartments in left lower leg are soft and easily compressible.  Skin:    General: Skin is warm and dry.  Neurological:     Mental Status: She is alert.     Sensory: No sensory  deficit.     Motor: No abnormal muscle tone.  Psychiatric:        Behavior: Behavior normal.    ED Results / Procedures / Treatments   Labs (all labs ordered are listed, but only abnormal results are displayed) Labs Reviewed - No data to display  EKG None  Radiology DG Ankle Complete Left  Result Date: 08/27/2021 CLINICAL DATA:  Fall, pain EXAM: LEFT ANKLE COMPLETE - 3+ VIEW COMPARISON:  None Available. FINDINGS: No fracture or dislocation is seen in the ankle. Compression plate and screw fixation of the distal fibula, without hardware fracture or loosening. Associated syndesmotic fixation. The ankle mortise is intact. Additional pertinent findings described on dedicated foot radiographs. IMPRESSION: No fracture or dislocation is seen in the ankle. Additional pertinent findings described on dedicated foot radiographs. Electronically Signed   By: Julian Hy M.D.   On: 08/27/2021 17:52   CT Foot Left Wo Contrast  Result Date: 08/27/2021 CLINICAL DATA:  Proximal metatarsal fractures, fall. EXAM: CT OF THE LEFT FOOT WITHOUT CONTRAST TECHNIQUE: Multidetector CT imaging of the left foot was performed according to the standard protocol. Multiplanar CT image reconstructions were also generated. RADIATION DOSE REDUCTION: This exam was performed according to the departmental dose-optimization program which includes automated exposure control, adjustment of the mA and/or kV according to patient size and/or use of iterative reconstruction technique. COMPARISON:  Radiographs 08/27/2021 FINDINGS: Bones/Joint/Cartilage Old plate and screw fixator of the distal fibula, track along the distal tibia transversely extending to a small metal button type device. No acute fracture involving the hindfoot. Subtle fracture of the distal lateral margin of the lateral cuneiform on image 67 series 8, also shown on image 73 series 7. Mildly comminuted but nondisplaced fracture of the base of the second metatarsal with  a transverse component through the metaphysis but also with extension of fracture planes to the articular surface, suspected lesser fragment involving the attachment site of the Lisfranc ligament. Nondisplaced fracture involving the metaphysis and proximal articular surface of the third metatarsal as on image 59 series 8. Transverse fracture of the proximal fourth metatarsal metaphysis on image 65 series 8. Small avulsion fracture from the plantar fifth metatarsal base, image 73 series 7. Ligaments Suboptimally assessed by CT. Muscles and Tendons Mild atrophy of the musculature of the foot. Soft tissues Unremarkable IMPRESSION: 1. Lisfranc joint injury fractures of the bases of the second, third, fourth, and fifth metatarsals along with a fracture the lateral margin of the lateral cuneiform. The comminuted nondisplaced fractures at the base of the second metatarsal may predispose to Lisfranc joint instability although there is no current substantial malalignment between the  midfoot and metatarsal bases. Electronically Signed   By: Van Clines M.D.   On: 08/27/2021 19:27   DG Foot Complete Left  Result Date: 08/27/2021 CLINICAL DATA:  Fall, pain EXAM: LEFT FOOT - COMPLETE 3+ VIEW COMPARISON:  None Available. FINDINGS: Mildly displaced fracture involving the proximal 4th metatarsal shaft. Nondisplaced fracture involving the proximal 3rd metatarsal, best visualized on the oblique ankle radiograph. Suspected fracture involving the base of the 2nd metatarsal, poorly visualized. No widening at the Lisfranc joint. Additional midfoot fractures are difficult to exclude. Specifically, the medial cuneiform is mildly irregular, although this may reflect overlapping structures. IMPRESSION: Suspected fractures involving the proximal 2nd, 3rd, and 4th metatarsals. No definite widening at the Lisfranc joint. Additional midfoot fractures are difficult to exclude. Consider CT for further evaluation as clinically warranted.  Electronically Signed   By: Julian Hy M.D.   On: 08/27/2021 17:55    Procedures Procedures    Medications Ordered in ED Medications  sodium chloride 0.9 % bolus 500 mL (0 mLs Intravenous Stopped 08/27/21 2139)  HYDROcodone-acetaminophen (NORCO/VICODIN) 5-325 MG per tablet 1 tablet (1 tablet Oral Given 08/27/21 1905)    ED Course/ Medical Decision Making/ A&P Clinical Course as of 08/27/21 2206  Tue Aug 27, 2021  2009 I spoke with Dr. Doran Durand on-call for [facility].  He recommends short leg splint with side stirrups, nonweightbearing and to call the office. [EH]    Clinical Course User Index [EH] Lorin Glass, PA-C                           Medical Decision Making Patient is a 76 year old woman who presents today for evaluation of a isolated injury to her left foot and ankle. This occurred when she was outside and had a mechanical nonsyncopal slip and fall.  She denies striking her head or any other injuries. Her blood pressures were in the 90s here, patient states she usually runs 100-1 10.  She denies any changes to her medications, is not symptomatic with this, and as she did not have any prodromal symptoms this appears to be unrelated. She does not have any wounds that would cause blood loss contributing to any hypotension. X-rays of the left foot and ankle were obtained showing no fractures or dislocations in the ankle with suspected fractures of the multiple metatarsals with recommendation for CT scan. CT scan is obtained showing injuries to the bases of the left second, third, fourth, and fifth metatarsals along with a lateral cuneiform fracture.   I spoke with Dr. Doran Durand on-call for [facility] as patient is established with Dr. Lyla Glassing at [facility], he recommends short leg splint, nonweightbearing and outpatient follow-up. Patient tells me that she still has a walker, scooter and multiple mobility assistance devices at home from when she previously injured the  ankle doing similar activities. She is given a prescription for on paper for a walker. She is instructed to be nonweightbearing.  [place] PMP is consulted and she is given a prescription for short course of narcotics. She was given a dose of this while in the emergency room which she tolerated well without any significant changes in her blood pressure.  Return precautions were discussed with patient who states their understanding.  At the time of discharge patient denied any unaddressed complaints or concerns.  Patient is agreeable for discharge home.  Note: Portions of this report may have been transcribed using voice recognition software. Every effort was made to  ensure accuracy; however, inadvertent computerized transcription errors may be present    Problems Addressed: Closed fracture of left foot, initial encounter: acute illness or injury Closed fracture of multiple metatarsal bones: acute illness or injury  Amount and/or Complexity of Data Reviewed Independent Historian: spouse Radiology: ordered and independent interpretation performed.    Details: Details doccumented in mdm Discussion of management or test interpretation with external provider(s): Orthopedics  Risk OTC drugs. Prescription drug management. Decision regarding hospitalization.           Final Clinical Impression(s) / ED Diagnoses Final diagnoses:  Closed fracture of multiple metatarsal bones  Closed fracture of left foot, initial encounter    Rx / DC Orders ED Discharge Orders          Ordered    HYDROcodone-acetaminophen (NORCO/VICODIN) 5-325 MG tablet  Every 6 hours PRN        08/27/21 2105              Lorin Glass, PA-C 08/27/21 2206    Horton, Alvin Critchley, DO 08/27/21 2313

## 2021-08-27 NOTE — Progress Notes (Signed)
Orthopedic Tech Progress Note Patient Details:  [name] 1946/01/03 [id number]  Ortho Devices Type of Ortho Device: Stirrup splint, Short leg splint Ortho Device/Splint Location: LLE Ortho Device/Splint Interventions: Ordered, Application, Adjustment   Post Interventions Patient Tolerated: Well Instructions Provided: Adjustment of device, Care of device Splint applied, I extended it to her toes in order to provide more support for the metatarsals that were broken. [name] 08/27/2021, 8:47 PM

## 2021-08-27 NOTE — ED Triage Notes (Signed)
Patient presents from home post fall while trimming tree branches. She rolled her left ankle and now complains of 8/10 pain, swelling to the left ankle and nausea. EMS administered IV fluids and 4 mg of Zofran.      EMS vitals: 90/65 BP 67 HR

## 2021-08-27 NOTE — Discharge Instructions (Addendum)
As we discussed today you have multiple fractures/broken bones in your foot. As this is the second time you have had a significant injury from trimming the trees/landscaping activities I would recommend that you consider alternatives.   Today you received medications that may make you sleepy or impair your ability to make decisions.  For the next 24 hours please do not drive, operate heavy machinery, care for a small child with out another adult present, or perform any activities that may cause harm to you or someone else if you were to fall asleep or be impaired.  Please do not take the perscription pain medication at the same time as any sedating medications such as benzodiazepines/clonazepam or sleep aids.  You are being prescribed a medication which may make you sleepy. Please follow up of listed precautions for at least 24 hours after taking one dose.  As we discussed today your prescription pain medication contains 325 mg of Tylenol.

## 2021-08-30 DIAGNOSIS — S92335A Nondisplaced fracture of third metatarsal bone, left foot, initial encounter for closed fracture: Secondary | ICD-10-CM | POA: Diagnosis not present

## 2021-08-30 DIAGNOSIS — S92345A Nondisplaced fracture of fourth metatarsal bone, left foot, initial encounter for closed fracture: Secondary | ICD-10-CM | POA: Diagnosis not present

## 2021-08-30 DIAGNOSIS — S92325A Nondisplaced fracture of second metatarsal bone, left foot, initial encounter for closed fracture: Secondary | ICD-10-CM | POA: Diagnosis not present

## 2021-09-03 ENCOUNTER — Other Ambulatory Visit: Payer: Self-pay | Admitting: Family Medicine

## 2021-09-03 DIAGNOSIS — M8580 Other specified disorders of bone density and structure, unspecified site: Secondary | ICD-10-CM

## 2021-09-06 DIAGNOSIS — S92345A Nondisplaced fracture of fourth metatarsal bone, left foot, initial encounter for closed fracture: Secondary | ICD-10-CM | POA: Diagnosis not present

## 2021-09-06 DIAGNOSIS — S92325A Nondisplaced fracture of second metatarsal bone, left foot, initial encounter for closed fracture: Secondary | ICD-10-CM | POA: Diagnosis not present

## 2021-09-06 DIAGNOSIS — I4891 Unspecified atrial fibrillation: Secondary | ICD-10-CM | POA: Diagnosis not present

## 2021-09-06 DIAGNOSIS — S92335A Nondisplaced fracture of third metatarsal bone, left foot, initial encounter for closed fracture: Secondary | ICD-10-CM | POA: Diagnosis not present

## 2021-09-07 ENCOUNTER — Other Ambulatory Visit: Payer: Self-pay | Admitting: Internal Medicine

## 2021-09-07 DIAGNOSIS — I480 Paroxysmal atrial fibrillation: Secondary | ICD-10-CM

## 2021-09-09 ENCOUNTER — Other Ambulatory Visit: Payer: Self-pay | Admitting: Cardiothoracic Surgery

## 2021-09-09 DIAGNOSIS — I7121 Aneurysm of the ascending aorta, without rupture: Secondary | ICD-10-CM

## 2021-09-09 NOTE — Telephone Encounter (Signed)
Received request for warfarin refill:  Last INR was 2.6 on 08/16/21 Next INR due on 09/27/21 LOV 05/31/21  R Ursuy PA-C  Refill approved.

## 2021-09-13 DIAGNOSIS — S92335A Nondisplaced fracture of third metatarsal bone, left foot, initial encounter for closed fracture: Secondary | ICD-10-CM | POA: Diagnosis not present

## 2021-09-13 DIAGNOSIS — S92325A Nondisplaced fracture of second metatarsal bone, left foot, initial encounter for closed fracture: Secondary | ICD-10-CM | POA: Diagnosis not present

## 2021-09-13 DIAGNOSIS — S92345A Nondisplaced fracture of fourth metatarsal bone, left foot, initial encounter for closed fracture: Secondary | ICD-10-CM | POA: Diagnosis not present

## 2021-09-27 ENCOUNTER — Ambulatory Visit (INDEPENDENT_AMBULATORY_CARE_PROVIDER_SITE_OTHER): Payer: Medicare Other

## 2021-09-27 DIAGNOSIS — I480 Paroxysmal atrial fibrillation: Secondary | ICD-10-CM | POA: Diagnosis not present

## 2021-09-27 DIAGNOSIS — S92325A Nondisplaced fracture of second metatarsal bone, left foot, initial encounter for closed fracture: Secondary | ICD-10-CM | POA: Diagnosis not present

## 2021-09-27 DIAGNOSIS — S92335A Nondisplaced fracture of third metatarsal bone, left foot, initial encounter for closed fracture: Secondary | ICD-10-CM | POA: Diagnosis not present

## 2021-09-27 DIAGNOSIS — S92345A Nondisplaced fracture of fourth metatarsal bone, left foot, initial encounter for closed fracture: Secondary | ICD-10-CM | POA: Diagnosis not present

## 2021-09-27 DIAGNOSIS — Z5181 Encounter for therapeutic drug level monitoring: Secondary | ICD-10-CM

## 2021-09-27 LAB — POCT INR: INR: 1.5 — AB (ref 2.0–3.0)

## 2021-10-18 ENCOUNTER — Ambulatory Visit (INDEPENDENT_AMBULATORY_CARE_PROVIDER_SITE_OTHER): Payer: Medicare Other | Admitting: *Deleted

## 2021-10-18 DIAGNOSIS — Z5181 Encounter for therapeutic drug level monitoring: Secondary | ICD-10-CM

## 2021-10-18 DIAGNOSIS — I480 Paroxysmal atrial fibrillation: Secondary | ICD-10-CM | POA: Diagnosis not present

## 2021-10-18 DIAGNOSIS — S92345A Nondisplaced fracture of fourth metatarsal bone, left foot, initial encounter for closed fracture: Secondary | ICD-10-CM | POA: Diagnosis not present

## 2021-10-18 DIAGNOSIS — S92335A Nondisplaced fracture of third metatarsal bone, left foot, initial encounter for closed fracture: Secondary | ICD-10-CM | POA: Diagnosis not present

## 2021-10-18 DIAGNOSIS — S92325A Nondisplaced fracture of second metatarsal bone, left foot, initial encounter for closed fracture: Secondary | ICD-10-CM | POA: Diagnosis not present

## 2021-10-18 LAB — POCT INR: INR: 2.7 (ref 2.0–3.0)

## 2021-10-18 NOTE — Patient Instructions (Signed)
Description   Continue taking warfarin 1.5 tablets daily. Recheck INR in 4 weeks. Coumadin Clinic [phone]     

## 2021-10-21 ENCOUNTER — Ambulatory Visit (INDEPENDENT_AMBULATORY_CARE_PROVIDER_SITE_OTHER): Payer: Medicare Other

## 2021-10-21 DIAGNOSIS — I495 Sick sinus syndrome: Secondary | ICD-10-CM | POA: Diagnosis not present

## 2021-10-22 DIAGNOSIS — E039 Hypothyroidism, unspecified: Secondary | ICD-10-CM | POA: Diagnosis not present

## 2021-10-22 DIAGNOSIS — Z0000 Encounter for general adult medical examination without abnormal findings: Secondary | ICD-10-CM | POA: Diagnosis not present

## 2021-10-22 DIAGNOSIS — R1032 Left lower quadrant pain: Secondary | ICD-10-CM | POA: Diagnosis not present

## 2021-10-22 DIAGNOSIS — I4891 Unspecified atrial fibrillation: Secondary | ICD-10-CM | POA: Diagnosis not present

## 2021-10-22 DIAGNOSIS — K219 Gastro-esophageal reflux disease without esophagitis: Secondary | ICD-10-CM | POA: Diagnosis not present

## 2021-10-22 DIAGNOSIS — E785 Hyperlipidemia, unspecified: Secondary | ICD-10-CM | POA: Diagnosis not present

## 2021-10-22 DIAGNOSIS — I7789 Other specified disorders of arteries and arterioles: Secondary | ICD-10-CM | POA: Diagnosis not present

## 2021-10-22 DIAGNOSIS — I495 Sick sinus syndrome: Secondary | ICD-10-CM | POA: Diagnosis not present

## 2021-10-22 DIAGNOSIS — Z7901 Long term (current) use of anticoagulants: Secondary | ICD-10-CM | POA: Diagnosis not present

## 2021-10-22 DIAGNOSIS — I10 Essential (primary) hypertension: Secondary | ICD-10-CM | POA: Diagnosis not present

## 2021-10-22 DIAGNOSIS — F418 Other specified anxiety disorders: Secondary | ICD-10-CM | POA: Diagnosis not present

## 2021-10-22 DIAGNOSIS — D0361 Melanoma in situ of right upper limb, including shoulder: Secondary | ICD-10-CM | POA: Diagnosis not present

## 2021-10-22 LAB — CUP PACEART REMOTE DEVICE CHECK
Battery Remaining Longevity: 107 mo
Battery Voltage: 2.99 V
Brady Statistic AP VP Percent: 0.39 %
Brady Statistic AP VS Percent: 93.97 %
Brady Statistic AS VP Percent: 0.03 %
Brady Statistic AS VS Percent: 5.61 %
Brady Statistic RA Percent Paced: 94.73 %
Brady Statistic RV Percent Paced: 0.42 %
Date Time Interrogation Session: 20230717014741
Implantable Lead Implant Date: 20180914
Implantable Lead Implant Date: 20180914
Implantable Lead Location: 753859
Implantable Lead Location: 753860
Implantable Lead Model: 5076
Implantable Lead Model: 5076
Implantable Pulse Generator Implant Date: 20180914
Lead Channel Impedance Value: 323 Ohm
Lead Channel Impedance Value: 418 Ohm
Lead Channel Impedance Value: 456 Ohm
Lead Channel Impedance Value: 513 Ohm
Lead Channel Pacing Threshold Amplitude: 0.625 V
Lead Channel Pacing Threshold Amplitude: 0.875 V
Lead Channel Pacing Threshold Pulse Width: 0.4 ms
Lead Channel Pacing Threshold Pulse Width: 0.4 ms
Lead Channel Sensing Intrinsic Amplitude: 12.5 mV
Lead Channel Sensing Intrinsic Amplitude: 12.5 mV
Lead Channel Sensing Intrinsic Amplitude: 4.125 mV
Lead Channel Sensing Intrinsic Amplitude: 4.125 mV
Lead Channel Setting Pacing Amplitude: 1.5 V
Lead Channel Setting Pacing Amplitude: 2.5 V
Lead Channel Setting Pacing Pulse Width: 0.4 ms
Lead Channel Setting Sensing Sensitivity: 0.9 mV

## 2021-10-25 ENCOUNTER — Telehealth: Payer: Self-pay | Admitting: *Deleted

## 2021-10-25 DIAGNOSIS — Z7901 Long term (current) use of anticoagulants: Secondary | ICD-10-CM | POA: Diagnosis not present

## 2021-10-25 DIAGNOSIS — Z1211 Encounter for screening for malignant neoplasm of colon: Secondary | ICD-10-CM | POA: Diagnosis not present

## 2021-10-25 DIAGNOSIS — R1319 Other dysphagia: Secondary | ICD-10-CM | POA: Diagnosis not present

## 2021-10-25 DIAGNOSIS — I700 Atherosclerosis of aorta: Secondary | ICD-10-CM

## 2021-10-25 DIAGNOSIS — K219 Gastro-esophageal reflux disease without esophagitis: Secondary | ICD-10-CM | POA: Diagnosis not present

## 2021-10-25 NOTE — Telephone Encounter (Signed)
   Pre-operative Risk Assessment    Patient Name: [name]  DOB: 10/22/1945 MRN: [id number]      Request for Surgical Clearance    Procedure:   COLONOSCOPY/ENDOSCOPY  Date of Surgery:  Clearance 01/14/22                                 Surgeon:  DR. [facility] Group or Practice Name:  [facility] Phone number:  [phone] Fax number:  [phone]   Type of Clearance Requested:   - Medical  - Pharmacy:  Hold Warfarin (Coumadin) NEEDS LOVENOX BRIDGING PER THE CLEARANCE REQUEST FORM   Type of Anesthesia:   PROPOFOL   Additional requests/questions:    Jiles Prows   10/25/2021, 12:03 PM

## 2021-10-28 ENCOUNTER — Ambulatory Visit
Admission: RE | Admit: 2021-10-28 | Discharge: 2021-10-28 | Disposition: A | Discharge status: Home / Self Care | Payer: Medicare Other | Source: Ambulatory Visit | Attending: Cardiothoracic Surgery | Admitting: Cardiothoracic Surgery

## 2021-10-28 ENCOUNTER — Ambulatory Visit (INDEPENDENT_AMBULATORY_CARE_PROVIDER_SITE_OTHER): Payer: Medicare Other | Admitting: Physician Assistant

## 2021-10-28 VITALS — BP 101/70 | HR 102 | Resp 20 | Ht 65.0 in | Wt 150.0 lb

## 2021-10-28 DIAGNOSIS — I7121 Aneurysm of the ascending aorta, without rupture: Secondary | ICD-10-CM

## 2021-10-28 DIAGNOSIS — I700 Atherosclerosis of aorta: Secondary | ICD-10-CM | POA: Diagnosis not present

## 2021-10-28 NOTE — Patient Instructions (Signed)
Patient is counseled regarding the importance of long term risk factor modification as they pertain to the presence of ischemic heart disease including avoiding the use of all tobacco products, dietary modifications and medical therapy for diabetes, cholesterol and lipid management, and regular exercise.    AVOID CIPROFLOXACIN as this increases the risk of aortic dissection

## 2021-10-28 NOTE — Progress Notes (Signed)
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  She was last seen in our office about a year ago by Dr. Orvan Seen at which time her aneurysm measured 4 cm and was incidentally found.  She presents today for 1 year surveillance follow up.  Overall the patient is doing okay.  She remains in a boot from multiple fractures she suffered in May.  She states they are finally healing and she can hopefully get her boot off in May.  She is experiencing some shortness of breath and chest discomfort.  She states the chest pain mainly occurs when she is lying flat.  The patient attributes these symptoms to her hiatal hernia and states she is scheduled for Endoscopy in October.   Current Outpatient Medications on File Prior to Visit  Medication Sig Dispense Refill   buPROPion (WELLBUTRIN XL) 150 MG 24 hr tablet Take 1 tablet by mouth daily.     Cholecalciferol (VITAMIN D3 PO) Take by mouth.     clonazePAM (KLONOPIN) 0.5 MG tablet Take 0.5 mg by mouth daily.     fluticasone (FLONASE) 50 MCG/ACT nasal spray Place 1 spray into both nostrils daily.     JUBLIA 10 % SOLN apply to affected nails qd     levothyroxine (SYNTHROID, LEVOTHROID) 88 MCG tablet Take 100 mcg by mouth daily before breakfast.     loratadine (CLARITIN) 10 MG tablet Take 10 mg by mouth daily as needed for allergies.     metoprolol succinate (TOPROL XL) 25 MG 24 hr tablet Take 0.5 tablets (12.5 mg total) by mouth daily. 45 tablet 0   pantoprazole (PROTONIX) 40 MG tablet Take 40 mg by mouth 2 (two) times daily.     REPATHA SURECLICK 673 MG/ML SOAJ INJECT CONTENTS OF 1 PEN SUBCUTANEOUSLY EVERY 14 DAYS. 2 mL 11   sertraline  (ZOLOFT) 50 MG tablet Take 100 tablets by mouth daily.     vitamin B-12 (CYANOCOBALAMIN) 1000 MCG tablet 2 tablets     warfarin (COUMADIN) 2.5 MG tablet TAKE 1-2 TABLETS BY MOUTH DAILY AS DIRECTED BY COUMADIN CLINIC. 170 tablet 1   HYDROcodone-acetaminophen (NORCO/VICODIN) 5-325 MG tablet Take 1 tablet by mouth every 6 (six) hours as needed for severe pain. 10 tablet 0   No current facility-administered medications on file prior to visit.     BP 101/70 (BP Location: Left Arm, Patient Position: Sitting)   Pulse (!) 102   Resp 20   Ht '5\' 5"'$  (1.651 m)   Wt 150 lb (68 kg)   SpO2 96% Comment: RA  BMI 24.96 kg/m   Physical Exam  Gen: NAD Heart: IRRR Lungs: CTA bilaterally Abd: soft non tender, non-distended Ext: no edema Neck: no carotid bruit Neuro: grossly intact  CTA Results:  FINDINGS: Cardiovascular: Aortic and mild mitral valve calcifications noted along left anterior descending coronary artery and aortic atherosclerotic calcifications.   Ascending thoracic aortic aneurysm 4.1 cm in diameter on image 61 series 2, previously 4.0 cm in diameter on 09/05/2015.   Adipose density along the wall of the left ventricular apex, cannot exclude remote infarct.  Mediastinum/Nodes: Small type 1 hiatal hernia.   Lungs/Pleura: Scarring in the posterior basal segment right lower lobe with chronic regional volume loss. Mild scarring in the lingula and atelectasis or scarring medially in the left lower lobe with some associated airway plugging.   Upper Abdomen: Cholecystectomy.   Musculoskeletal: Unremarkable   IMPRESSION: 1. Ascending thoracic aortic aneurysm 4.1 cm in diameter. This previously measured 4.0 cm in diameter on 09/05/2015. Recommend annual imaging followup by CTA or MRA. This recommendation follows 2010  CCF/AHA/AATS/ACR/ASA/SCA/SCAI/SIR/STS/SVM Guidelines for the Diagnosis and Management of Patients with Thoracic Aortic Disease. Circulation. 2010; 121:  Y850-Y774. Aortic aneurysm NOS (ICD10-I71.9) 2. Adipose tissue in the wall of the left ventricular apex, query remote infarct. 3. Small type 1 hiatal hernia. 4. Bibasilar scarring and/or atelectasis. 5. Aortic and mitral valve calcifications. 6. Aortic Atherosclerosis (ICD10-I70.0). Left anterior descending coronary atherosclerosis.    Electronically Signed   By: Van Clines M.D.   On: 10/28/2021 14:32    A/P:  Ascending Aortic Aneurysm- measuring 4.1 cm which is minimally changed from previous CT Scan LAD Calcification/Atherosclerosis- patiet is having some chest pain/shortness of breath.  She is scheduled to see Dr. Tamala Julian on 8/2, which is of benefit incase her symptoms are actually anginal and not related to her hiatal hernia A. Fib/Flutter per EP HTN HLD on Repatha Hypothyroidism Hiatal Hernia- per GI, for Endoscopy in October RTC in 1 year with repeat CTA chest  Risk Modification:  Statin:  No, intolerance, is on Repatha  Smoking cessation instruction/counseling given:   Patient not a smoker  Patient was counseled on importance of Blood Pressure Control.  Despite Medical intervention if the patient notices persistently elevated blood pressure readings.  They are instructed to contact their Primary Care Physician  Please avoid use of Fluoroquinolones as this can potentially increase your risk of Aortic Rupture and/or Dissection  Patient educated on signs and symptoms of Aortic Dissection, handout also provided in AVS  Terre Hanneman, PA-C 10/28/21

## 2021-10-29 DIAGNOSIS — I700 Atherosclerosis of aorta: Secondary | ICD-10-CM | POA: Insufficient documentation

## 2021-10-29 NOTE — Telephone Encounter (Addendum)
   Name: [name]  DOB: 1946/04/05  MRN: [id number]  Primary Cardiologist: Sinclair Grooms, MD  Chart reviewed as part of pre-operative protocol coverage. The patient has an upcoming visit scheduled with Dr. Tamala Julian on 11/06/2021 at which time clearance can be addressed in case there are any issues that would impact surgical recommendations.  EGD/colonoscopy is not scheduled until 01/14/2022 as below. I added preop FYI to appointment note so that provider is aware to address at time of outpatient visit.  Per office protocol the cardiology provider should for their finalize clearance decision and recommendations regarding antiplatelet therapy to the requesting party below.    Patient with diagnosis of afib on warfarin for anticoagulation.     Procedure: COLONOSCOPY/ENDOSCOPY Date of procedure: 01/14/22   CHA2DS2-VASc Score = 7  This indicates a 11.2% annual risk of stroke. The patient's score is based upon: CHF History: 0 HTN History: 1 Diabetes History: 0 Stroke History: 2 Vascular Disease History: 1 Age Score: 2 Gender Score: 1   Aortic atherosclerosis noted on chest CT 10/2021, PMH updated.   CrCl 10m/min Platelet count 289K - last checked 08/2020. Recommend rechecking to ensure platelets are stable.   Per office protocol, patient can hold warfarin for 5 days prior to procedure. Patient will need bridging with Lovenox (enoxaparin) around procedure. [facility].  I will route this message as FYI to requesting party and remove this message from the preop box as separate preop APP input not needed at this time.   Please call with any questions.  ELenna Sciara NP  10/29/2021, 10:03 AM

## 2021-10-29 NOTE — Telephone Encounter (Signed)
Patient with diagnosis of afib on warfarin for anticoagulation.    Procedure: COLONOSCOPY/ENDOSCOPY Date of procedure: 01/14/22  CHA2DS2-VASc Score = 7  This indicates a 11.2% annual risk of stroke. The patient's score is based upon: CHF History: 0 HTN History: 1 Diabetes History: 0 Stroke History: 2 Vascular Disease History: 1 Age Score: 2 Gender Score: 1  Aortic atherosclerosis noted on chest CT 10/2021, PMH updated.  CrCl 32m/min Platelet count 289K - last checked 08/2020. Recommend rechecking to ensure platelets are stable.  Per office protocol, patient can hold warfarin for 5 days prior to procedure. Patient will need bridging with Lovenox (enoxaparin) around procedure. [facility].  **This guidance is not considered finalized until pre-operative APP has relayed final recommendations.**

## 2021-10-31 DIAGNOSIS — Z872 Personal history of diseases of the skin and subcutaneous tissue: Secondary | ICD-10-CM | POA: Diagnosis not present

## 2021-10-31 DIAGNOSIS — D485 Neoplasm of uncertain behavior of skin: Secondary | ICD-10-CM | POA: Diagnosis not present

## 2021-10-31 DIAGNOSIS — Z08 Encounter for follow-up examination after completed treatment for malignant neoplasm: Secondary | ICD-10-CM | POA: Diagnosis not present

## 2021-10-31 DIAGNOSIS — L570 Actinic keratosis: Secondary | ICD-10-CM | POA: Diagnosis not present

## 2021-10-31 DIAGNOSIS — L814 Other melanin hyperpigmentation: Secondary | ICD-10-CM | POA: Diagnosis not present

## 2021-10-31 DIAGNOSIS — D225 Melanocytic nevi of trunk: Secondary | ICD-10-CM | POA: Diagnosis not present

## 2021-10-31 DIAGNOSIS — L821 Other seborrheic keratosis: Secondary | ICD-10-CM | POA: Diagnosis not present

## 2021-10-31 DIAGNOSIS — Z09 Encounter for follow-up examination after completed treatment for conditions other than malignant neoplasm: Secondary | ICD-10-CM | POA: Diagnosis not present

## 2021-10-31 DIAGNOSIS — Z85820 Personal history of malignant melanoma of skin: Secondary | ICD-10-CM | POA: Diagnosis not present

## 2021-11-05 NOTE — Progress Notes (Unsigned)
Cardiology Office Note:    Date:  11/06/2021   ID:  [name], DOB 12-Jan-1946, MRN [id number]  PCP:  Aura Dials, MD  Cardiologist:  Sinclair Grooms, MD   Referring MD: Aura Dials, MD   Chief Complaint  Patient presents with   Coronary Artery Disease   Chest Pain   Atrial Fibrillation   Thoracic Aortic Aneurysm    History of Present Illness:    [name] is a 76 y.o. female with a hx of anxiety, COPD, deaf (R ear), stroke, HTN, HLD, hypothyroidism, ascending aortic aneurysm (4.1 cm 2023), Afib, and tachy-brady w/PPM.    Needs Pre-op clearance for colonoscopy in October 2023.  Since her right foot fracture, she has been sedentary.  Not having dyspnea on exertion with mild to moderate activity.  Also having vague chest discomfort that is precordial.  Recent CT scan done to follow a small ascending aortic aneurysm (4.1 cm)  Past Medical History:  Diagnosis Date   Acoustic neuroma ([place])    Hx of right ear acoustic neuroma, removed in 1999. No hearing in right ear.   Anxiety    Hx of, responds to Wellbutrin   Basal cell carcinoma    s/p MOHS surgery in 03/2011   Breast cancer ([place]) 2021   left lumpectomy   Cataract    Chronic anticoagulation 11/24/2016   Chronic cholecystitis with calculus 05/13/2013   Closed fracture of left lateral malleolus 08/10/2017   COPD (chronic obstructive pulmonary disease) ([place])    Deafness in right ear    Embolic stroke ([place]) 48/54/6270   Family history of adverse reaction to anesthesia    Family hx of PONV   Fracture    left lateral malleolus fracture   GERD (gastroesophageal reflux disease)    Headache(784.0)    MIGRAINES   Hearing loss    Only in right ear, from an acoustic neuroma. S/P removal in 1999.    History of hiatal hernia    History of neck pain    Responds to Flexeril   History of shingles    Hypercholesteremia    Hypertension    Hypothyroidism    Melanoma ([place]) 2018   right  arm-surgery only   PAF (paroxysmal atrial fibrillation) ([place])    2 brief episodes of Afib recorded on monitor 02/2007   Paroxysmal atrial fibrillation ([facility])    PONV (postoperative nausea and vomiting)    Presence of permanent cardiac pacemaker 2018   for A-fib   Sick sinus syndrome ([place]) 12/19/2016   Sinus bradycardia    Tachycardia-bradycardia syndrome ([place]) 11/24/2016   TIA (transient ischemic attack) 2011   On coumadin. Asymptomatic, without recurrence.    Past Surgical History:  Procedure Laterality Date   ABDOMINAL HYSTERECTOMY     APPENDECTOMY     BREAST BIOPSY     X 3   BREAST CYST ASPIRATION Left 50 yrs ago per pt   BREAST EXCISIONAL BIOPSY Left 50 yrs ago   BREAST LUMPECTOMY WITH RADIOACTIVE SEED LOCALIZATION Left 06/21/2019   Procedure: LEFT BREAST LUMPECTOMY WITH RADIOACTIVE SEED LOCALIZATION;  Surgeon: Donnie Mesa, MD;  Location: [facility];  Service: General;  Laterality: Left;   CHOLECYSTECTOMY N/A 05/26/2013   Procedure: LAPAROSCOPIC CHOLECYSTECTOMY WITH INTRAOPERATIVE CHOLANGIOGRAM;  Surgeon: Imogene Burn. Georgette Dover, MD;  Location: [facility];  Service: General;  Laterality: N/A;   CRANIECTOMY FOR EXCISION OF ACOUSTIC NEUROMA Right 1999   INTRAOCULAR LENS INSERTION     Dr. Talbert Forest   MOHS  SURGERY  03/2011   Basal cell skin cancer   ORIF ANKLE FRACTURE Left 08/10/2017   Procedure: OPEN REDUCTION INTERNAL FIXATION (ORIF) LEFT ANKLE FRACTURE;  Surgeon: Rod Can, MD;  Location: [facility];  Service: Orthopedics;  Laterality: Left;   PACEMAKER IMPLANT N/A 12/19/2016   Medtronic Azure XT MRI conditional dual-chamber pacemaker for symptomatic sinus bradycardia by Dr Rayann Heman   TONSILLECTOMY      Current Medications: Current Meds  Medication Sig   buPROPion (WELLBUTRIN XL) 150 MG 24 hr tablet Take 1 tablet by mouth daily.   Cholecalciferol (VITAMIN D3 PO) Take by mouth.   clonazePAM (KLONOPIN) 0.5 MG tablet Take 0.5 mg by mouth daily.   Docusate Sodium (DSS) 100 MG  CAPS Take 1 tablet by mouth as needed.   fluticasone (CUTIVATE) 0.05 % cream as needed.   fluticasone (FLONASE) 50 MCG/ACT nasal spray Place 1 spray into both nostrils daily.   HYDROcodone-acetaminophen (NORCO/VICODIN) 5-325 MG tablet Take 1 tablet by mouth every 6 (six) hours as needed for severe pain.   JUBLIA 10 % SOLN apply to affected nails qd   levocetirizine (XYZAL) 5 MG tablet Take 1 tablet by mouth every evening.   levothyroxine (SYNTHROID) 100 MCG tablet Take 100 mcg by mouth every morning.   loratadine (CLARITIN) 10 MG tablet Take 10 mg by mouth daily as needed for allergies.   metoprolol succinate (TOPROL XL) 25 MG 24 hr tablet Take 0.5 tablets (12.5 mg total) by mouth daily.   pantoprazole (PROTONIX) 40 MG tablet Take 40 mg by mouth 2 (two) times daily.   REPATHA SURECLICK 169 MG/ML SOAJ INJECT CONTENTS OF 1 PEN SUBCUTANEOUSLY EVERY 14 DAYS.   sertraline (ZOLOFT) 100 MG tablet Take 100 mg by mouth daily. Pt takes 1 tablet once a day.   vitamin B-12 (CYANOCOBALAMIN) 1000 MCG tablet 2 tablets   warfarin (COUMADIN) 2.5 MG tablet TAKE 1-2 TABLETS BY MOUTH DAILY AS DIRECTED BY COUMADIN CLINIC.   [DISCONTINUED] levothyroxine (SYNTHROID, LEVOTHROID) 88 MCG tablet Take 100 mcg by mouth daily before breakfast.     Allergies:   Atorvastatin, Statins, Codeine, Ezetimibe, Latex, Penicillins, and Rosuvastatin   Social History   Socioeconomic History   Marital status: Married    Spouse name: [name]   Number of children: 3   Years of education: masters   Highest education level: Not on file  Occupational History   Occupation: retired    Comment: [employer]  Tobacco Use   Smoking status: Never   Smokeless tobacco: Never  Vaping Use   Vaping Use: Never used  Substance and Sexual Activity   Alcohol use: Yes    Comment: Rare   Drug use: No   Sexual activity: Not Currently    Birth control/protection: Surgical  Other Topics Concern   Not on file  Social History Narrative   Pt  lives with spouse.    Caffeine Use: 1-2 glasses per week.   Social Determinants of Health   Financial Resource Strain: Not on file  Food Insecurity: Not on file  Transportation Needs: Not on file  Physical Activity: Not on file  Stress: Not on file  Social Connections: Not on file     Family History: The patient's family history includes Alcoholism in her father; Brain cancer in her maternal aunt; Breast cancer in her maternal aunt; CVA in an other family member; Cancer in her maternal grandmother; Diabetes in her mother; Healthy in her sister; Heart disease in an other family member; High Cholesterol in  her mother; Hyperlipidemia in an other family member; Hypertension in her mother and another family member; Liver cancer in her father; Memory loss in her mother.  ROS:   Please see the history of present illness.    She is frightened after her appointment with [facility] last week where it was implied that the LAD calcification meant that she had a severe blockage.  She is having increasing difficulty with reflux.  Frustrated by the limitations due to the foot fracture.  Has upcoming upper and lower GI endoscopy scheduled.  All other systems reviewed and are negative.  EKGs/Labs/Other Studies Reviewed:    The following studies were reviewed today:  2 D Doppler ECHOCARDIOGRAM 2022: IMPRESSIONS   1. Left ventricular ejection fraction, by estimation, is 55 to 60%. The  left ventricle has normal function. The left ventricle has no regional  wall motion abnormalities. There is mild asymmetric left ventricular  hypertrophy of the basal and septal  segments. Left ventricular diastolic parameters are indeterminate. The  average left ventricular global longitudinal strain is -17.4 %.   2. Pacing wires in RA/RV . Right ventricular systolic function is normal.  The right ventricular size is normal. There is normal pulmonary artery  systolic pressure.   3. Left atrial size was moderately dilated.    4. The mitral valve is normal in structure. Moderate mitral valve  regurgitation. No evidence of mitral stenosis.   5. The aortic valve was not well visualized. There is moderate  calcification of the aortic valve. There is moderate thickening of the  aortic valve. Aortic valve regurgitation is mild. Mild to moderate aortic  valve sclerosis/calcification is present,  without any evidence of aortic stenosis.   6. The inferior vena cava is normal in size with greater than 50%  respiratory variability, suggesting right atrial pressure of 3 mmHg.   Chest CT without contrast 10/28/2021: IMPRESSION: 1. Ascending thoracic aortic aneurysm 4.1 cm in diameter. This previously measured 4.0 cm in diameter on 09/05/2015. Recommend annual imaging followup by CTA or MRA. This recommendation follows 2010 ACCF/AHA/AATS/ACR/ASA/SCA/SCAI/SIR/STS/SVM Guidelines for the Diagnosis and Management of Patients with Thoracic Aortic Disease. Circulation. 2010; 121: E423-N361. Aortic aneurysm NOS (ICD10-I71.9) 2. Adipose tissue in the wall of the left ventricular apex, query remote infarct. 3. Small type 1 hiatal hernia. 4. Bibasilar scarring and/or atelectasis. 5. Aortic and mitral valve calcifications. 6. Aortic Atherosclerosis (ICD10-I70.0). Left anterior descending coronary atherosclerosis.    EKG:  EKG low voltage, sinus tachycardia, with nonspecific T wave flattening.  Compared to 05/31/2021, the heart rate is faster and atrial pacing is not present on the current tracing.  Recent Labs: 12/07/2020: ALT 13  Recent Lipid Panel    Component Value Date/Time   CHOL 222 (H) 12/07/2020 0855   TRIG 150 (H) 12/07/2020 0855   HDL 52 12/07/2020 0855   CHOLHDL 4.3 12/07/2020 0855   CHOLHDL 4 12/26/2013 0824   VLDL 44.8 (H) 12/26/2013 0824   LDLCALC 143 (H) 12/07/2020 0855   LDLDIRECT 106.6 12/26/2013 0824    Physical Exam:    VS:  BP 102/75   Pulse (!) 118   Ht '5\' 5"'$  (1.651 m)   Wt 150 lb (68 kg)    SpO2 96%   BMI 24.96 kg/m     Wt Readings from Last 3 Encounters:  11/06/21 150 lb (68 kg)  10/28/21 150 lb (68 kg)  05/31/21 154 lb (69.9 kg)    Heart rate on check-in was 118 bpm but on auscultation during  exam heart rate is 70. GEN: Compatible with age. No acute distress HEENT: Normal NECK: No JVD. LYMPHATICS: No lymphadenopathy CARDIAC: No murmur. RRR no gallop, or edema. VASCULAR:  Normal Pulses. No bruits. RESPIRATORY:  Clear to auscultation without rales, wheezing or rhonchi  ABDOMEN: Soft, non-tender, non-distended, No pulsatile mass, MUSCULOSKELETAL: No deformity  SKIN: Warm and dry NEUROLOGIC:  Alert and oriented x 3 PSYCHIATRIC:  Normal affect   ASSESSMENT:    1. Paroxysmal atrial fibrillation (H[facility])   2. Tachycardia-bradycardia syndrome ([place])   3. Cardiac pacemaker in situ   4. Chronic anticoagulation   5. Hyperlipidemia LDL goal <70   6. Aortic atherosclerosis ([employer] [facility])   7. Pre-op evaluation   8. Chest discomfort    PLAN:    In order of problems listed above:  Currently in sinus tachycardia initially after walking in from the parking lot.  Heart rate is 70 bpm now which is the pacemaker rate.  Prior EKGs demonstrated atrial pacing at 70 bpm. Medication therapy plus permanent pacemaker.  The meds for tachycardia include metoprolol succinate 25 mg/day. Normal function Coumadin therapy without complications [place] with recent LDL of 199.  She needs to go back to the lipid clinic to determine if this is accurate and whether or not there is additional add-on therapy to help achieve a better overall lowering of LDL. Improved lipid control will help with aortic atherosclerosis and decrease progression of the mild aortic aneurysm. Nuclear stress test is being done and will help serve as a means of clearance for upcoming upper and lower GI endoscopy.   Overall education and awareness concerning secondary risk prevention was discussed in detail: LDL less than 70,  hemoglobin A1c less than 7, blood pressure target less than 130/80 mmHg, >150 minutes of moderate aerobic activity per week, avoidance of smoking, weight control (via diet and exercise), and continued surveillance/management of/for obstructive sleep apnea.    Medication Adjustments/Labs and Tests Ordered: Current medicines are reviewed at length with the patient today.  Concerns regarding medicines are outlined above.  Orders Placed This Encounter  Procedures   MYOCARDIAL PERFUSION IMAGING   EKG 12-Lead   No orders of the defined types were placed in this encounter.   Patient Instructions  Medication Instructions:  Your physician recommends that you continue on your current medications as directed. Please refer to the Current Medication list given to you today.  *If you need a refill on your cardiac medications before your next appointment, please call your pharmacy*  Lab Work: NONE  Testing/Procedures: Your physician has requested that you have a lexiscan myoview. For further information please visit [email or web address]. Please follow instruction sheet, as given.   Follow-Up: At [facility], you and your health needs are our priority.  As part of our continuing mission to provide you with exceptional heart care, we have created designated Provider Care Teams.  These Care Teams include your primary Cardiologist (physician) and Advanced Practice Providers (APPs -  Physician Assistants and Nurse Practitioners) who all work together to provide you with the care you need, when you need it.  Your next appointment:   1 year(s)  The format for your next appointment:   In Person  Provider:   Sinclair Grooms, MD {   Important Information About Sugar         Signed, Sinclair Grooms, MD  11/06/2021 10:46 AM    [place]

## 2021-11-06 ENCOUNTER — Ambulatory Visit (INDEPENDENT_AMBULATORY_CARE_PROVIDER_SITE_OTHER): Payer: Medicare Other | Admitting: Interventional Cardiology

## 2021-11-06 ENCOUNTER — Encounter: Payer: Self-pay | Admitting: Interventional Cardiology

## 2021-11-06 VITALS — BP 102/75 | HR 118 | Ht 65.0 in | Wt 150.0 lb

## 2021-11-06 DIAGNOSIS — Z01818 Encounter for other preprocedural examination: Secondary | ICD-10-CM | POA: Diagnosis not present

## 2021-11-06 DIAGNOSIS — Z7901 Long term (current) use of anticoagulants: Secondary | ICD-10-CM | POA: Diagnosis not present

## 2021-11-06 DIAGNOSIS — Z950 Presence of cardiac pacemaker: Secondary | ICD-10-CM

## 2021-11-06 DIAGNOSIS — E785 Hyperlipidemia, unspecified: Secondary | ICD-10-CM | POA: Diagnosis not present

## 2021-11-06 DIAGNOSIS — R0789 Other chest pain: Secondary | ICD-10-CM

## 2021-11-06 DIAGNOSIS — I480 Paroxysmal atrial fibrillation: Secondary | ICD-10-CM | POA: Diagnosis not present

## 2021-11-06 DIAGNOSIS — I700 Atherosclerosis of aorta: Secondary | ICD-10-CM

## 2021-11-06 DIAGNOSIS — I495 Sick sinus syndrome: Secondary | ICD-10-CM

## 2021-11-06 NOTE — Patient Instructions (Addendum)
Medication Instructions:  Your physician recommends that you continue on your current medications as directed. Please refer to the Current Medication list given to you today.  *If you need a refill on your cardiac medications before your next appointment, please call your pharmacy*  Lab Work: NONE  Testing/Procedures: Your physician has requested that you have a lexiscan myoview. For further information please visit [email or web address]. Please follow instruction sheet, as given.   Follow-Up: At [facility], you and your health needs are our priority.  As part of our continuing mission to provide you with exceptional heart care, we have created designated Provider Care Teams.  These Care Teams include your primary Cardiologist (physician) and Advanced Practice Providers (APPs -  Physician Assistants and Nurse Practitioners) who all work together to provide you with the care you need, when you need it.  Your next appointment:   1 year(s)  The format for your next appointment:   In Person  Provider:   Sinclair Grooms, MD {   Important Information About Sugar

## 2021-11-08 ENCOUNTER — Telehealth (HOSPITAL_COMMUNITY): Payer: Self-pay | Admitting: Radiology

## 2021-11-08 NOTE — Telephone Encounter (Signed)
Left message on voicemail per DPR in reference to upcoming appointment scheduled on 8/9 at 7:45 with detailed instructions given per Myocardial Perfusion Study Information Sheet for the test. LM to arrive 15 minutes early, and that it is imperative to arrive on time for appointment to keep from having the test rescheduled. If you need to cancel or reschedule your appointment, please call the office within 24 hours of your appointment. Failure to do so may result in a cancellation of your appointment, and a $50 no show fee. Phone number given for call back for any questions.

## 2021-11-13 ENCOUNTER — Ambulatory Visit (HOSPITAL_COMMUNITY): Payer: Medicare Other | Attending: Interventional Cardiology

## 2021-11-13 DIAGNOSIS — R0789 Other chest pain: Secondary | ICD-10-CM | POA: Insufficient documentation

## 2021-11-13 LAB — MYOCARDIAL PERFUSION IMAGING
LV dias vol: 45 mL (ref 46–106)
LV sys vol: 11 mL
Nuc Stress EF: 75 %
Peak HR: 63 {beats}/min
Rest HR: 65 {beats}/min
Rest Nuclear Isotope Dose: 10.5 mCi
SDS: 0
SRS: 4
SSS: 4
ST Depression (mm): 0 mm
Stress Nuclear Isotope Dose: 31.5 mCi
TID: 1.02

## 2021-11-13 MED ORDER — REGADENOSON 0.4 MG/5ML IV SOLN
0.4000 mg | Freq: Once | INTRAVENOUS | Status: AC
  Administered 2021-11-13: 0.4 mg via INTRAVENOUS

## 2021-11-13 MED ORDER — TECHNETIUM TC 99M TETROFOSMIN IV KIT
31.5000 | PACK | Freq: Once | INTRAVENOUS | Status: AC | PRN
  Administered 2021-11-13: 31.5 via INTRAVENOUS

## 2021-11-13 MED ORDER — TECHNETIUM TC 99M TETROFOSMIN IV KIT
10.5000 | PACK | Freq: Once | INTRAVENOUS | Status: AC | PRN
  Administered 2021-11-13: 10.5 via INTRAVENOUS

## 2021-11-14 ENCOUNTER — Encounter: Payer: Self-pay | Admitting: Interventional Cardiology

## 2021-11-15 ENCOUNTER — Ambulatory Visit (INDEPENDENT_AMBULATORY_CARE_PROVIDER_SITE_OTHER): Payer: Medicare Other

## 2021-11-15 DIAGNOSIS — S92335A Nondisplaced fracture of third metatarsal bone, left foot, initial encounter for closed fracture: Secondary | ICD-10-CM | POA: Diagnosis not present

## 2021-11-15 DIAGNOSIS — I480 Paroxysmal atrial fibrillation: Secondary | ICD-10-CM | POA: Diagnosis not present

## 2021-11-15 DIAGNOSIS — Z5181 Encounter for therapeutic drug level monitoring: Secondary | ICD-10-CM

## 2021-11-15 DIAGNOSIS — S92345A Nondisplaced fracture of fourth metatarsal bone, left foot, initial encounter for closed fracture: Secondary | ICD-10-CM | POA: Diagnosis not present

## 2021-11-15 DIAGNOSIS — S92325A Nondisplaced fracture of second metatarsal bone, left foot, initial encounter for closed fracture: Secondary | ICD-10-CM | POA: Diagnosis not present

## 2021-11-15 LAB — POCT INR: INR: 3.1 — AB (ref 2.0–3.0)

## 2021-11-15 NOTE — Patient Instructions (Signed)
Continue taking warfarin 1.5 tablets daily. EAT GREENS TONIGHT. Recheck INR in 6 weeks.  Coumadin Clinic [phone];

## 2021-11-21 DIAGNOSIS — L03116 Cellulitis of left lower limb: Secondary | ICD-10-CM | POA: Diagnosis not present

## 2021-11-22 ENCOUNTER — Telehealth: Payer: Self-pay | Admitting: *Deleted

## 2021-11-22 NOTE — Telephone Encounter (Signed)
Pt called and stated that she was started on a 10 day course of bactrim ( 800-'160mg'$ ) on 11/22/2021. Pt stated that she does not like any greens. Instructed pt to take warfarin 1 tablet (2.'5mg'$ )  on 8/19, 8/20, and 8/21, instead of her normal dose of Warfarin, which is 1&1/2 tablets (3.'75mg'$ ). Scheduled pt to come into the office on 11/26/2021 to have INR checked. Pt verbalized understanding.

## 2021-11-22 NOTE — Progress Notes (Signed)
Remote pacemaker transmission.   

## 2021-11-27 ENCOUNTER — Emergency Department (HOSPITAL_COMMUNITY)
Admission: EM | Admit: 2021-11-27 | Discharge: 2021-11-27 | Discharge status: Against Medical Advice | Payer: Medicare Other | Attending: Emergency Medicine | Admitting: Emergency Medicine

## 2021-11-27 ENCOUNTER — Ambulatory Visit (INDEPENDENT_AMBULATORY_CARE_PROVIDER_SITE_OTHER): Payer: Medicare Other

## 2021-11-27 ENCOUNTER — Other Ambulatory Visit: Payer: Self-pay

## 2021-11-27 ENCOUNTER — Encounter (HOSPITAL_COMMUNITY): Payer: Self-pay

## 2021-11-27 DIAGNOSIS — Z5181 Encounter for therapeutic drug level monitoring: Secondary | ICD-10-CM

## 2021-11-27 DIAGNOSIS — I480 Paroxysmal atrial fibrillation: Secondary | ICD-10-CM | POA: Diagnosis not present

## 2021-11-27 DIAGNOSIS — M79642 Pain in left hand: Secondary | ICD-10-CM | POA: Diagnosis not present

## 2021-11-27 DIAGNOSIS — Z5321 Procedure and treatment not carried out due to patient leaving prior to being seen by health care provider: Secondary | ICD-10-CM | POA: Diagnosis not present

## 2021-11-27 DIAGNOSIS — R197 Diarrhea, unspecified: Secondary | ICD-10-CM | POA: Diagnosis not present

## 2021-11-27 DIAGNOSIS — R1110 Vomiting, unspecified: Secondary | ICD-10-CM | POA: Insufficient documentation

## 2021-11-27 LAB — COMPREHENSIVE METABOLIC PANEL
ALT: 47 U/L — ABNORMAL HIGH (ref 0–44)
AST: 48 U/L — ABNORMAL HIGH (ref 15–41)
Albumin: 3.8 g/dL (ref 3.5–5.0)
Alkaline Phosphatase: 78 U/L (ref 38–126)
Anion gap: 12 (ref 5–15)
BUN: 18 mg/dL (ref 8–23)
CO2: 24 mmol/L (ref 22–32)
Calcium: 9.9 mg/dL (ref 8.9–10.3)
Chloride: 105 mmol/L (ref 98–111)
Creatinine, Ser: 1.15 mg/dL — ABNORMAL HIGH (ref 0.44–1.00)
GFR, Estimated: 49 mL/min — ABNORMAL LOW (ref >60)
Glucose, Bld: 115 mg/dL — ABNORMAL HIGH (ref 70–99)
Potassium: 4.8 mmol/L (ref 3.5–5.1)
Sodium: 141 mmol/L (ref 135–145)
Total Bilirubin: 0.3 mg/dL (ref 0.3–1.2)
Total Protein: 7.1 g/dL (ref 6.5–8.1)

## 2021-11-27 LAB — CBC
HCT: 43.1 % (ref 36.0–46.0)
Hemoglobin: 13.9 g/dL (ref 12.0–15.0)
MCH: 27.4 pg (ref 26.0–34.0)
MCHC: 32.3 g/dL (ref 30.0–36.0)
MCV: 84.8 fL (ref 80.0–100.0)
Platelets: 325 10*3/uL (ref 150–400)
RBC: 5.08 MIL/uL (ref 3.87–5.11)
RDW: 15.1 % (ref 11.5–15.5)
WBC: 5.5 10*3/uL (ref 4.0–10.5)
nRBC: 0 % (ref 0.0–0.2)

## 2021-11-27 LAB — LIPASE, BLOOD: Lipase: 24 U/L (ref 11–51)

## 2021-11-27 LAB — POCT INR: INR: 5.8 — AB (ref 2.0–3.0)

## 2021-11-27 LAB — PROTIME-INR
INR: 3.8 — ABNORMAL HIGH (ref 0.8–1.2)
Prothrombin Time: 37.1 seconds — ABNORMAL HIGH (ref 11.4–15.2)

## 2021-11-27 NOTE — ED Provider Triage Note (Signed)
Emergency Medicine Provider Triage Evaluation Note  [name] , a 76 y.o. female  was evaluated in triage.  Pt complains of diarrhea since Thursday.  Patient states that she went to her primary care this morning who recommended she come to the emergency department for IV fluids and possible stool culture.  Patient states she has had no blood in her stool but did notice some bright red blood this morning thought to be from hemorrhoids due to irritation.  She also states that her Coumadin level was abnormally high this morning and was over 5.  She states that she was nauseated at some points throughout the past week but that she took medication which helped.  She states she vomited 2 times.  She denies abdominal pain at this time  Review of Systems  Positive: Diarrhea Negative: Abdominal pain, shortness of breath  Physical Exam  BP 138/82   Pulse 96   Temp (!) 97.5 F (36.4 C) (Oral)   Resp 18   Ht '5\' 5"'$  (1.651 m)   Wt 68 kg   SpO2 97%   BMI 24.96 kg/m  Gen:   Awake, no distress   Resp:  Normal effort  MSK:   Moves extremities without difficulty  Other:    Medical Decision Making  Medically screening exam initiated at 12:08 PM.  Appropriate orders placed.  Debbrah Sampedro was informed that the remainder of the evaluation will be completed by another provider, this initial triage assessment does not replace that evaluation, and the importance of remaining in the ED until their evaluation is complete.     Dorothyann Peng, PA-C 11/27/21 1213

## 2021-11-27 NOTE — ED Notes (Signed)
Pt turned in labels and stated she was leaving

## 2021-11-27 NOTE — ED Triage Notes (Signed)
Pt reports diarrhea since Thursday and was sent here to receive fluids for dehydration. Denies N/V and abdominal pain. Pt endorses some abdominal cramping with diarrhea.

## 2021-11-27 NOTE — ED Notes (Signed)
Pt cannot provide urine sample at this time. Pt educated on need for sample.

## 2021-11-27 NOTE — Patient Instructions (Signed)
Description   Hold today's dose and tomorrow's dose and then continue taking warfarin 1.5 tablets daily.  Recheck INR in 1 week.  Coumadin Clinic [phone]

## 2021-11-29 ENCOUNTER — Ambulatory Visit: Payer: Medicare Other

## 2021-12-04 ENCOUNTER — Ambulatory Visit: Payer: Medicare Other | Attending: Interventional Cardiology | Admitting: *Deleted

## 2021-12-04 DIAGNOSIS — Z5181 Encounter for therapeutic drug level monitoring: Secondary | ICD-10-CM | POA: Insufficient documentation

## 2021-12-04 DIAGNOSIS — I480 Paroxysmal atrial fibrillation: Secondary | ICD-10-CM | POA: Diagnosis not present

## 2021-12-04 LAB — POCT INR: INR: 2 (ref 2.0–3.0)

## 2021-12-04 NOTE — Patient Instructions (Signed)
Description   Continue taking warfarin 1.5 tablets daily. Recheck INR in 3 weeks. Coumadin Clinic [phone]

## 2021-12-12 DIAGNOSIS — M79642 Pain in left hand: Secondary | ICD-10-CM | POA: Diagnosis not present

## 2021-12-23 DIAGNOSIS — D485 Neoplasm of uncertain behavior of skin: Secondary | ICD-10-CM | POA: Diagnosis not present

## 2021-12-23 DIAGNOSIS — L570 Actinic keratosis: Secondary | ICD-10-CM | POA: Diagnosis not present

## 2021-12-25 DIAGNOSIS — Z23 Encounter for immunization: Secondary | ICD-10-CM | POA: Diagnosis not present

## 2021-12-27 ENCOUNTER — Ambulatory Visit: Payer: Medicare Other

## 2022-01-02 ENCOUNTER — Ambulatory Visit: Payer: Medicare Other | Attending: Interventional Cardiology

## 2022-01-02 DIAGNOSIS — Z5181 Encounter for therapeutic drug level monitoring: Secondary | ICD-10-CM

## 2022-01-02 DIAGNOSIS — I480 Paroxysmal atrial fibrillation: Secondary | ICD-10-CM | POA: Diagnosis not present

## 2022-01-02 LAB — POCT INR: INR: 1.8 — AB (ref 2.0–3.0)

## 2022-01-02 MED ORDER — ENOXAPARIN SODIUM 60 MG/0.6ML IJ SOSY: 60.0000 mg | PREFILLED_SYRINGE | Freq: Two times a day (BID) | INTRAMUSCULAR | 1 refills | Status: DC

## 2022-01-02 NOTE — Patient Instructions (Signed)
TAKE 2 TABLETS TODAY ONLY and then continue 1.5 tablets daily. Recheck INR in 3 weeks. Coumadin Clinic [phone];  Colonoscopy/ Endoscopy 10/10;  Lovenox Bridging  10/4: Last dose of warfarin.  10/5: No warfarin or enoxaparin (Lovenox).  10/6: Inject enoxaparin '60mg'$  in the fatty abdominal tissue at least 2 inches from the belly button twice a day about 12 hours apart, 8am and 8pm rotate sites. No warfarin.  10/7: Inject enoxaparin in the fatty tissue every 12 hours, 8am and 8pm. No warfarin.  10/8: Inject enoxaparin in the fatty tissue every 12 hours, 8am and 8pm. No warfarin.  10/9: Inject enoxaparin in the fatty tissue in the morning at 8 am (No PM dose). No warfarin.  10/10: Procedure Day - No enoxaparin - Resume warfarin in the evening or as directed by doctor (take an extra half tablet with usual dose for 2 days then resume normal dose).  10/11: Resume enoxaparin inject in the fatty tissue every 12 hours and take warfarin  10/12: Inject enoxaparin in the fatty tissue every 12 hours and take warfarin  10/13: Inject enoxaparin in the fatty tissue every 12 hours and take warfarin  10/14: Inject enoxaparin in the fatty tissue every 12 hours and take warfarin  10/15: Inject enoxaparin in the fatty tissue every 12 hours and take warfarin  10/16: warfarin appt to check INR.

## 2022-01-03 DIAGNOSIS — S92335A Nondisplaced fracture of third metatarsal bone, left foot, initial encounter for closed fracture: Secondary | ICD-10-CM | POA: Diagnosis not present

## 2022-01-03 DIAGNOSIS — S92325A Nondisplaced fracture of second metatarsal bone, left foot, initial encounter for closed fracture: Secondary | ICD-10-CM | POA: Diagnosis not present

## 2022-01-03 DIAGNOSIS — S92345A Nondisplaced fracture of fourth metatarsal bone, left foot, initial encounter for closed fracture: Secondary | ICD-10-CM | POA: Diagnosis not present

## 2022-01-09 ENCOUNTER — Other Ambulatory Visit: Payer: Self-pay | Admitting: Family Medicine

## 2022-01-09 DIAGNOSIS — Z1231 Encounter for screening mammogram for malignant neoplasm of breast: Secondary | ICD-10-CM

## 2022-01-14 DIAGNOSIS — K5730 Diverticulosis of large intestine without perforation or abscess without bleeding: Secondary | ICD-10-CM | POA: Diagnosis not present

## 2022-01-14 DIAGNOSIS — D122 Benign neoplasm of ascending colon: Secondary | ICD-10-CM | POA: Diagnosis not present

## 2022-01-14 DIAGNOSIS — K648 Other hemorrhoids: Secondary | ICD-10-CM | POA: Diagnosis not present

## 2022-01-14 DIAGNOSIS — K449 Diaphragmatic hernia without obstruction or gangrene: Secondary | ICD-10-CM | POA: Diagnosis not present

## 2022-01-14 DIAGNOSIS — K2289 Other specified disease of esophagus: Secondary | ICD-10-CM | POA: Diagnosis not present

## 2022-01-14 DIAGNOSIS — K2930 Chronic superficial gastritis without bleeding: Secondary | ICD-10-CM | POA: Diagnosis not present

## 2022-01-14 DIAGNOSIS — R1310 Dysphagia, unspecified: Secondary | ICD-10-CM | POA: Diagnosis not present

## 2022-01-14 DIAGNOSIS — K3189 Other diseases of stomach and duodenum: Secondary | ICD-10-CM | POA: Diagnosis not present

## 2022-01-14 DIAGNOSIS — R6881 Early satiety: Secondary | ICD-10-CM | POA: Diagnosis not present

## 2022-01-14 DIAGNOSIS — R140 Abdominal distension (gaseous): Secondary | ICD-10-CM | POA: Diagnosis not present

## 2022-01-14 DIAGNOSIS — Z1211 Encounter for screening for malignant neoplasm of colon: Secondary | ICD-10-CM | POA: Diagnosis not present

## 2022-01-14 DIAGNOSIS — K644 Residual hemorrhoidal skin tags: Secondary | ICD-10-CM | POA: Diagnosis not present

## 2022-01-16 DIAGNOSIS — K2289 Other specified disease of esophagus: Secondary | ICD-10-CM | POA: Diagnosis not present

## 2022-01-16 DIAGNOSIS — D122 Benign neoplasm of ascending colon: Secondary | ICD-10-CM | POA: Diagnosis not present

## 2022-01-16 DIAGNOSIS — K2930 Chronic superficial gastritis without bleeding: Secondary | ICD-10-CM | POA: Diagnosis not present

## 2022-01-20 ENCOUNTER — Ambulatory Visit (INDEPENDENT_AMBULATORY_CARE_PROVIDER_SITE_OTHER): Payer: Medicare Other

## 2022-01-20 ENCOUNTER — Ambulatory Visit: Payer: Medicare Other | Attending: Interventional Cardiology | Admitting: *Deleted

## 2022-01-20 DIAGNOSIS — I480 Paroxysmal atrial fibrillation: Secondary | ICD-10-CM

## 2022-01-20 DIAGNOSIS — Z5181 Encounter for therapeutic drug level monitoring: Secondary | ICD-10-CM

## 2022-01-20 DIAGNOSIS — I495 Sick sinus syndrome: Secondary | ICD-10-CM

## 2022-01-20 LAB — POCT INR: INR: 1.6 — AB (ref 2.0–3.0)

## 2022-01-20 NOTE — Patient Instructions (Signed)
Description   TAKE 2 TABLETS TODAY AND 2 TABLETS TOMORROW ONLY and then continue 1.5 tablets daily. Recheck INR in 1 WEEK. Coumadin Clinic [phone]

## 2022-01-21 LAB — CUP PACEART REMOTE DEVICE CHECK
Battery Remaining Longevity: 102 mo
Battery Voltage: 2.99 V
Brady Statistic AP VP Percent: 0.35 %
Brady Statistic AP VS Percent: 89.86 %
Brady Statistic AS VP Percent: 0 %
Brady Statistic AS VS Percent: 9.79 %
Brady Statistic RA Percent Paced: 90.13 %
Brady Statistic RV Percent Paced: 0.35 %
Date Time Interrogation Session: 20231016074347
Implantable Lead Implant Date: 20180914
Implantable Lead Implant Date: 20180914
Implantable Lead Location: 753859
Implantable Lead Location: 753860
Implantable Lead Model: 5076
Implantable Lead Model: 5076
Implantable Pulse Generator Implant Date: 20180914
Lead Channel Impedance Value: 323 Ohm
Lead Channel Impedance Value: 380 Ohm
Lead Channel Impedance Value: 418 Ohm
Lead Channel Impedance Value: 494 Ohm
Lead Channel Pacing Threshold Amplitude: 0.625 V
Lead Channel Pacing Threshold Amplitude: 1 V
Lead Channel Pacing Threshold Pulse Width: 0.4 ms
Lead Channel Pacing Threshold Pulse Width: 0.4 ms
Lead Channel Sensing Intrinsic Amplitude: 12.25 mV
Lead Channel Sensing Intrinsic Amplitude: 12.25 mV
Lead Channel Sensing Intrinsic Amplitude: 2.75 mV
Lead Channel Sensing Intrinsic Amplitude: 2.75 mV
Lead Channel Setting Pacing Amplitude: 1.5 V
Lead Channel Setting Pacing Amplitude: 2.5 V
Lead Channel Setting Pacing Pulse Width: 0.4 ms
Lead Channel Setting Sensing Sensitivity: 0.9 mV

## 2022-01-22 ENCOUNTER — Ambulatory Visit: Payer: Medicare Other

## 2022-01-29 ENCOUNTER — Ambulatory Visit: Payer: Medicare Other | Attending: Interventional Cardiology | Admitting: *Deleted

## 2022-01-29 DIAGNOSIS — I480 Paroxysmal atrial fibrillation: Secondary | ICD-10-CM

## 2022-01-29 DIAGNOSIS — Z5181 Encounter for therapeutic drug level monitoring: Secondary | ICD-10-CM

## 2022-01-29 LAB — POCT INR: INR: 2.2 (ref 2.0–3.0)

## 2022-01-29 NOTE — Patient Instructions (Signed)
Description   Continue taking warfarin 1.5 tablets daily. Recheck INR in 3 weeks. Coumadin Clinic [phone]

## 2022-02-06 NOTE — Progress Notes (Signed)
Remote pacemaker transmission.   

## 2022-02-11 DIAGNOSIS — Z23 Encounter for immunization: Secondary | ICD-10-CM | POA: Diagnosis not present

## 2022-02-12 ENCOUNTER — Other Ambulatory Visit: Payer: Self-pay | Admitting: Interventional Cardiology

## 2022-02-12 DIAGNOSIS — I480 Paroxysmal atrial fibrillation: Secondary | ICD-10-CM

## 2022-02-12 NOTE — Telephone Encounter (Signed)
Last INR 01/29/2022 Last OV 11/06/2021

## 2022-02-14 ENCOUNTER — Encounter: Payer: Self-pay | Admitting: Pharmacist

## 2022-02-14 ENCOUNTER — Telehealth: Payer: Self-pay | Admitting: Pharmacist

## 2022-02-14 NOTE — Telephone Encounter (Signed)
Patient called. [employer] grant expired.  Renewed with patient on phone and sent information via myChart

## 2022-02-17 ENCOUNTER — Ambulatory Visit: Payer: Medicare Other | Attending: Cardiology | Admitting: *Deleted

## 2022-02-17 DIAGNOSIS — Z5181 Encounter for therapeutic drug level monitoring: Secondary | ICD-10-CM | POA: Diagnosis not present

## 2022-02-17 DIAGNOSIS — I480 Paroxysmal atrial fibrillation: Secondary | ICD-10-CM

## 2022-02-17 LAB — POCT INR: INR: 2.9 (ref 2.0–3.0)

## 2022-02-17 NOTE — Patient Instructions (Signed)
Description   Continue taking warfarin 1.5 tablets daily. Recheck INR in 4 weeks. Coumadin Clinic [phone]

## 2022-02-18 DIAGNOSIS — R944 Abnormal results of kidney function studies: Secondary | ICD-10-CM | POA: Diagnosis not present

## 2022-02-18 DIAGNOSIS — E039 Hypothyroidism, unspecified: Secondary | ICD-10-CM | POA: Diagnosis not present

## 2022-03-02 DIAGNOSIS — Z03818 Encounter for observation for suspected exposure to other biological agents ruled out: Secondary | ICD-10-CM | POA: Diagnosis not present

## 2022-03-02 DIAGNOSIS — B349 Viral infection, unspecified: Secondary | ICD-10-CM | POA: Diagnosis not present

## 2022-03-02 DIAGNOSIS — R0981 Nasal congestion: Secondary | ICD-10-CM | POA: Diagnosis not present

## 2022-03-02 DIAGNOSIS — R051 Acute cough: Secondary | ICD-10-CM | POA: Diagnosis not present

## 2022-03-02 DIAGNOSIS — R52 Pain, unspecified: Secondary | ICD-10-CM | POA: Diagnosis not present

## 2022-03-02 DIAGNOSIS — R112 Nausea with vomiting, unspecified: Secondary | ICD-10-CM | POA: Diagnosis not present

## 2022-03-05 DIAGNOSIS — J069 Acute upper respiratory infection, unspecified: Secondary | ICD-10-CM | POA: Diagnosis not present

## 2022-03-06 ENCOUNTER — Ambulatory Visit: Payer: Medicare Other

## 2022-03-10 ENCOUNTER — Telehealth: Payer: Self-pay

## 2022-03-10 NOTE — Telephone Encounter (Signed)
I spoke to patient who called to say that she started Doxycycline last Wednesday and needs to have her INR checked.  I scheduled her for 12/5.

## 2022-03-11 ENCOUNTER — Ambulatory Visit: Payer: Medicare Other | Attending: Cardiology

## 2022-03-11 DIAGNOSIS — Z5181 Encounter for therapeutic drug level monitoring: Secondary | ICD-10-CM | POA: Diagnosis not present

## 2022-03-11 DIAGNOSIS — I480 Paroxysmal atrial fibrillation: Secondary | ICD-10-CM

## 2022-03-11 LAB — POCT INR: INR: 5.1 — AB (ref 2.0–3.0)

## 2022-03-11 NOTE — Patient Instructions (Signed)
HOLD TODAY AND WEDNESDAY ONLY THEN Continue taking warfarin 1.5 tablets daily. Recheck INR in 1 week. Coumadin Clinic [phone];  Thursday and Saturday eat greens.

## 2022-03-19 ENCOUNTER — Ambulatory Visit: Payer: Medicare Other | Attending: Interventional Cardiology

## 2022-03-19 DIAGNOSIS — I480 Paroxysmal atrial fibrillation: Secondary | ICD-10-CM

## 2022-03-19 DIAGNOSIS — Z5181 Encounter for therapeutic drug level monitoring: Secondary | ICD-10-CM

## 2022-03-19 LAB — POCT INR: INR: 3.2 — AB (ref 2.0–3.0)

## 2022-03-19 NOTE — Patient Instructions (Signed)
Description   START taking warfarin 1.5 tablets daily EXCEPT 1 tablet on Sundays and Wednesdays.  Recheck INR in 1 week.  Stay consistent with greens (2 per week)  Coumadin Clinic [phone]

## 2022-03-27 ENCOUNTER — Ambulatory Visit: Payer: Medicare Other | Attending: Internal Medicine | Admitting: *Deleted

## 2022-03-27 DIAGNOSIS — I480 Paroxysmal atrial fibrillation: Secondary | ICD-10-CM | POA: Diagnosis not present

## 2022-03-27 DIAGNOSIS — Z5181 Encounter for therapeutic drug level monitoring: Secondary | ICD-10-CM

## 2022-03-27 LAB — POCT INR: INR: 2.3 (ref 2.0–3.0)

## 2022-03-27 NOTE — Patient Instructions (Signed)
Description   Continue taking warfarin 1.5 tablets daily EXCEPT 1 tablet on Sundays and Wednesdays.  Recheck INR in 2 weeks. Stay consistent with greens (2 per week).  Coumadin Clinic [phone]

## 2022-04-11 ENCOUNTER — Ambulatory Visit: Payer: Medicare Other | Attending: Cardiology

## 2022-04-11 DIAGNOSIS — Z5181 Encounter for therapeutic drug level monitoring: Secondary | ICD-10-CM

## 2022-04-11 DIAGNOSIS — I480 Paroxysmal atrial fibrillation: Secondary | ICD-10-CM

## 2022-04-11 LAB — POCT INR: INR: 2.7 (ref 2.0–3.0)

## 2022-04-11 NOTE — Patient Instructions (Signed)
Continue taking warfarin 1.5 tablets daily EXCEPT 1 tablet on Sundays and Wednesdays.  Recheck INR in 4 weeks. Stay consistent with greens (2 per week).  Coumadin Clinic [phone]

## 2022-04-21 ENCOUNTER — Ambulatory Visit (INDEPENDENT_AMBULATORY_CARE_PROVIDER_SITE_OTHER): Payer: Medicare Other

## 2022-04-21 DIAGNOSIS — I495 Sick sinus syndrome: Secondary | ICD-10-CM

## 2022-04-22 LAB — CUP PACEART REMOTE DEVICE CHECK
Battery Remaining Longevity: 100 mo
Battery Voltage: 2.99 V
Brady Statistic AP VP Percent: 0.31 %
Brady Statistic AP VS Percent: 94.96 %
Brady Statistic AS VP Percent: 0 %
Brady Statistic AS VS Percent: 4.72 %
Brady Statistic RA Percent Paced: 95.68 %
Brady Statistic RV Percent Paced: 0.32 %
Date Time Interrogation Session: 20240114211236
Implantable Lead Connection Status: 753985
Implantable Lead Connection Status: 753985
Implantable Lead Implant Date: 20180914
Implantable Lead Implant Date: 20180914
Implantable Lead Location: 753859
Implantable Lead Location: 753860
Implantable Lead Model: 5076
Implantable Lead Model: 5076
Implantable Pulse Generator Implant Date: 20180914
Lead Channel Impedance Value: 323 Ohm
Lead Channel Impedance Value: 380 Ohm
Lead Channel Impedance Value: 399 Ohm
Lead Channel Impedance Value: 475 Ohm
Lead Channel Pacing Threshold Amplitude: 0.625 V
Lead Channel Pacing Threshold Amplitude: 0.75 V
Lead Channel Pacing Threshold Pulse Width: 0.4 ms
Lead Channel Pacing Threshold Pulse Width: 0.4 ms
Lead Channel Sensing Intrinsic Amplitude: 13.25 mV
Lead Channel Sensing Intrinsic Amplitude: 13.25 mV
Lead Channel Sensing Intrinsic Amplitude: 2.5 mV
Lead Channel Sensing Intrinsic Amplitude: 2.5 mV
Lead Channel Setting Pacing Amplitude: 1.5 V
Lead Channel Setting Pacing Amplitude: 2.5 V
Lead Channel Setting Pacing Pulse Width: 0.4 ms
Lead Channel Setting Sensing Sensitivity: 0.9 mV
Zone Setting Status: 755011
Zone Setting Status: 755011

## 2022-04-24 ENCOUNTER — Other Ambulatory Visit (HOSPITAL_COMMUNITY): Payer: Self-pay

## 2022-04-30 ENCOUNTER — Ambulatory Visit
Admission: RE | Admit: 2022-04-30 | Discharge: 2022-04-30 | Disposition: A | Discharge status: Home / Self Care | Payer: Medicare Other | Source: Ambulatory Visit | Attending: Family Medicine | Admitting: Family Medicine

## 2022-04-30 DIAGNOSIS — Z1231 Encounter for screening mammogram for malignant neoplasm of breast: Secondary | ICD-10-CM

## 2022-05-01 DIAGNOSIS — Z85820 Personal history of malignant melanoma of skin: Secondary | ICD-10-CM | POA: Diagnosis not present

## 2022-05-01 DIAGNOSIS — D225 Melanocytic nevi of trunk: Secondary | ICD-10-CM | POA: Diagnosis not present

## 2022-05-01 DIAGNOSIS — Z08 Encounter for follow-up examination after completed treatment for malignant neoplasm: Secondary | ICD-10-CM | POA: Diagnosis not present

## 2022-05-01 DIAGNOSIS — L538 Other specified erythematous conditions: Secondary | ICD-10-CM | POA: Diagnosis not present

## 2022-05-01 DIAGNOSIS — Z872 Personal history of diseases of the skin and subcutaneous tissue: Secondary | ICD-10-CM | POA: Diagnosis not present

## 2022-05-01 DIAGNOSIS — Z86006 Personal history of melanoma in-situ: Secondary | ICD-10-CM | POA: Diagnosis not present

## 2022-05-01 DIAGNOSIS — Z09 Encounter for follow-up examination after completed treatment for conditions other than malignant neoplasm: Secondary | ICD-10-CM | POA: Diagnosis not present

## 2022-05-01 DIAGNOSIS — D485 Neoplasm of uncertain behavior of skin: Secondary | ICD-10-CM | POA: Diagnosis not present

## 2022-05-01 DIAGNOSIS — B351 Tinea unguium: Secondary | ICD-10-CM | POA: Diagnosis not present

## 2022-05-01 DIAGNOSIS — L814 Other melanin hyperpigmentation: Secondary | ICD-10-CM | POA: Diagnosis not present

## 2022-05-01 DIAGNOSIS — L820 Inflamed seborrheic keratosis: Secondary | ICD-10-CM | POA: Diagnosis not present

## 2022-05-01 DIAGNOSIS — L821 Other seborrheic keratosis: Secondary | ICD-10-CM | POA: Diagnosis not present

## 2022-05-02 IMAGING — US US ABDOMEN LIMITED
1 series · 14 of 25 positions shown · non-contrast
Comparison: 05/04/2013

CLINICAL DATA: Elevated liver enzymes

EXAM:
ULTRASOUND ABDOMEN LIMITED RIGHT UPPER QUADRANT

[Series 1: us abdomen limited · 0.25mm/px · 14 of 64 slices shown]
[im 1/64]
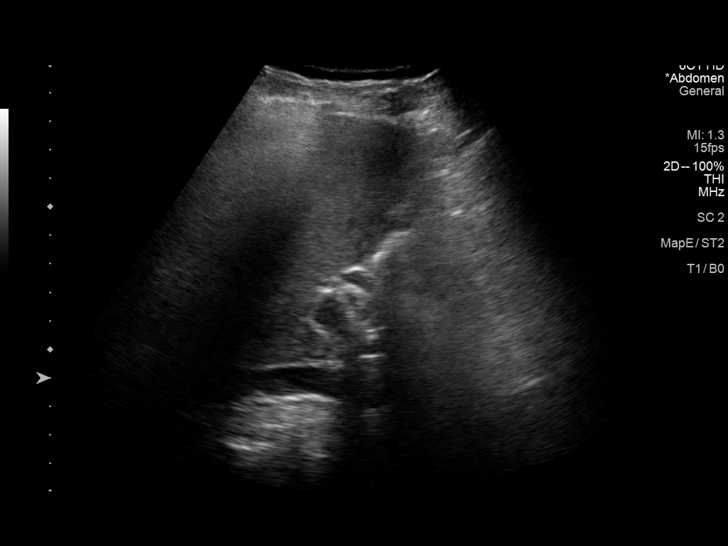
[im 6/64]
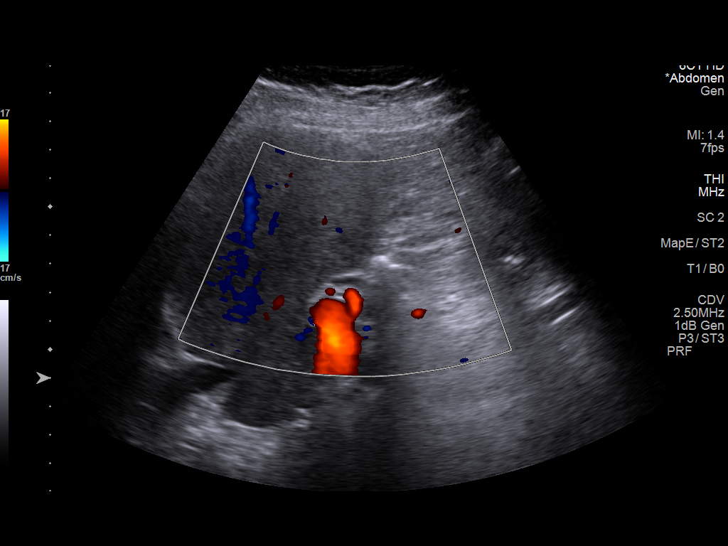
[im 11/64]
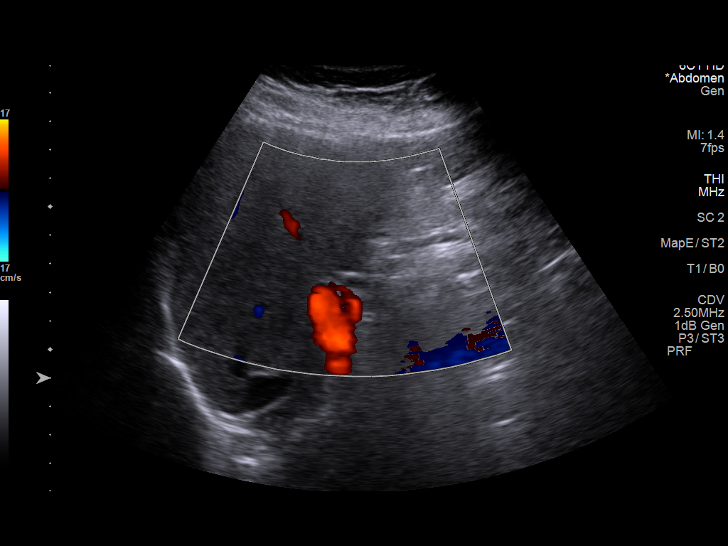
[im 16/64]
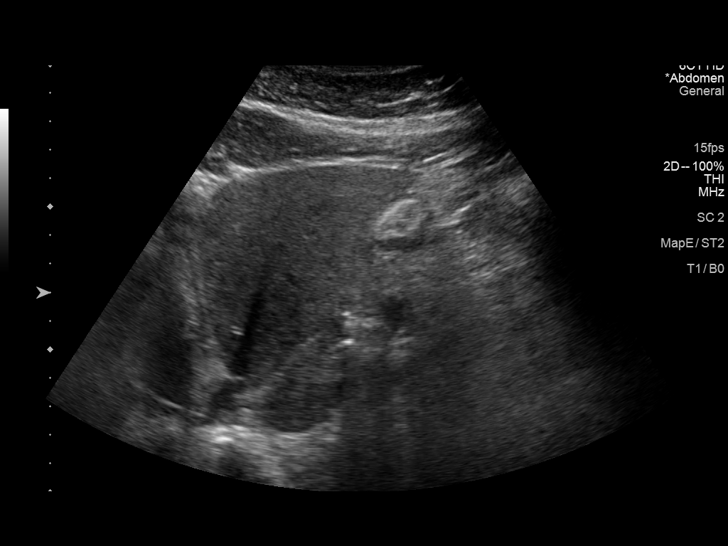
[im 22/64]
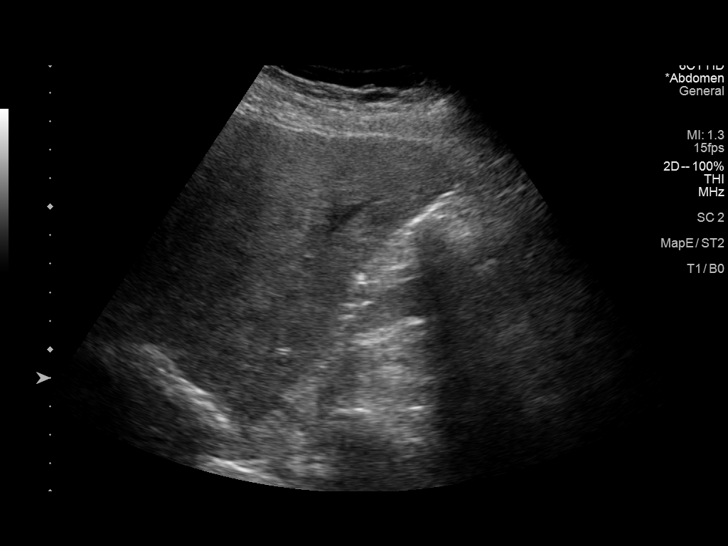
[im 24/64]
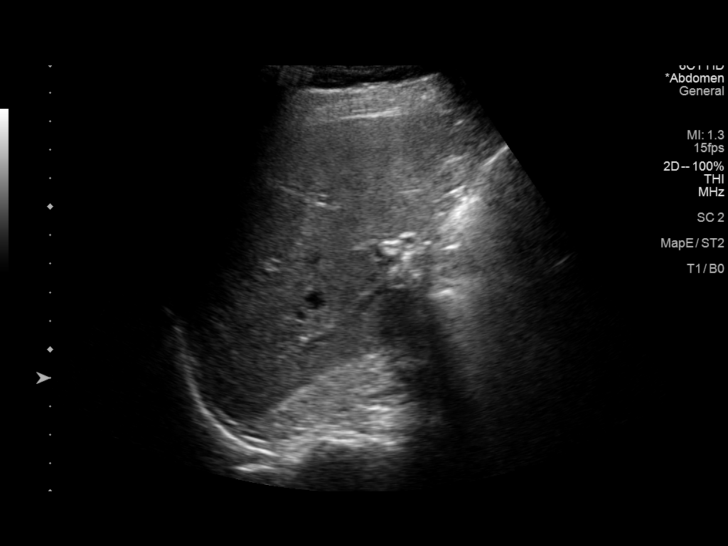
[im 29/64]
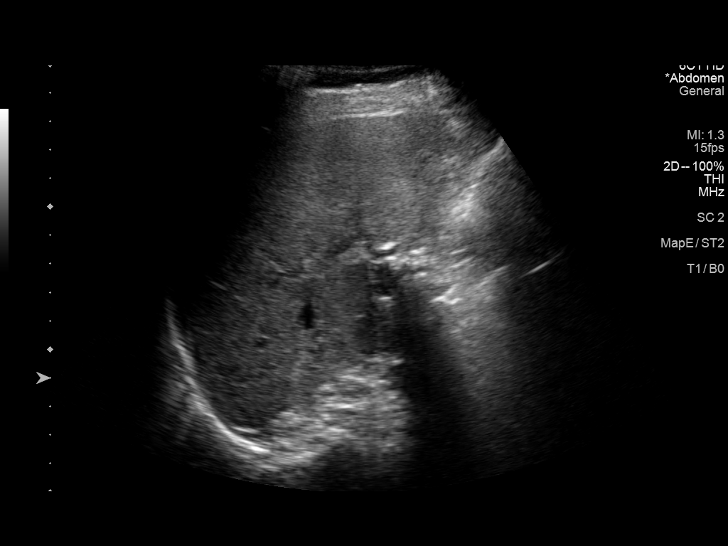
[im 35/64]
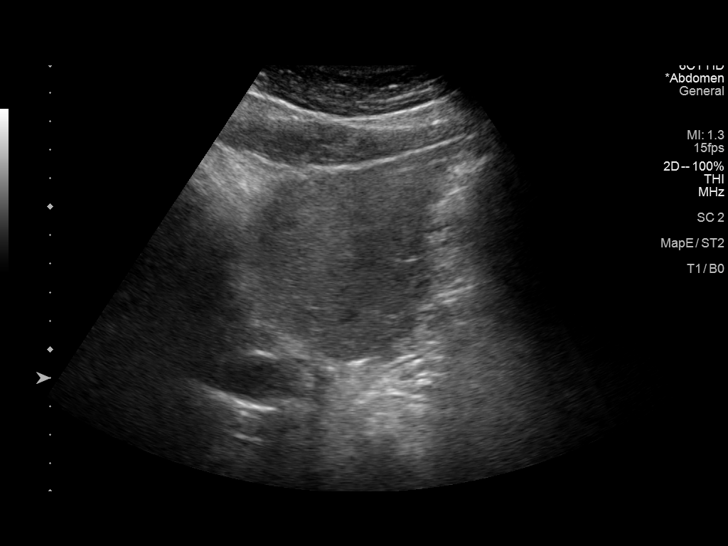
[im 40/64]
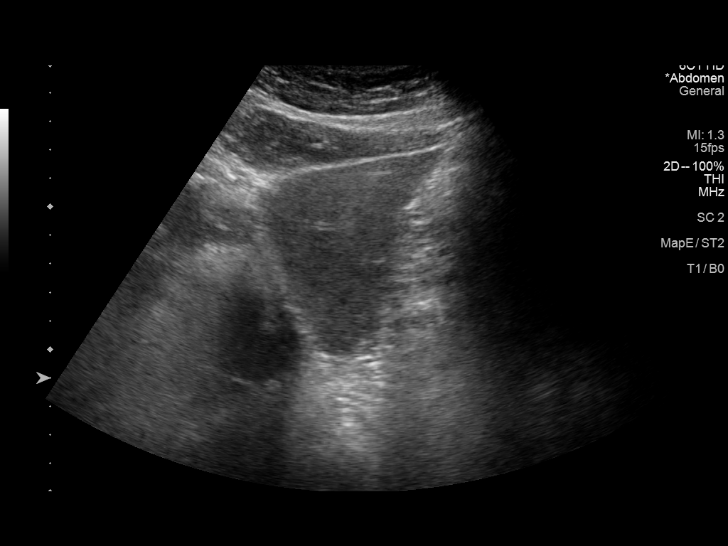
[im 43/64]
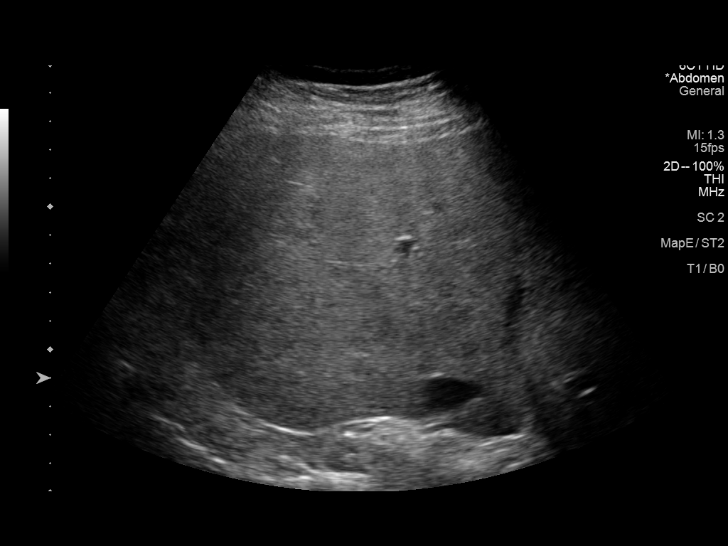
[im 48/64]
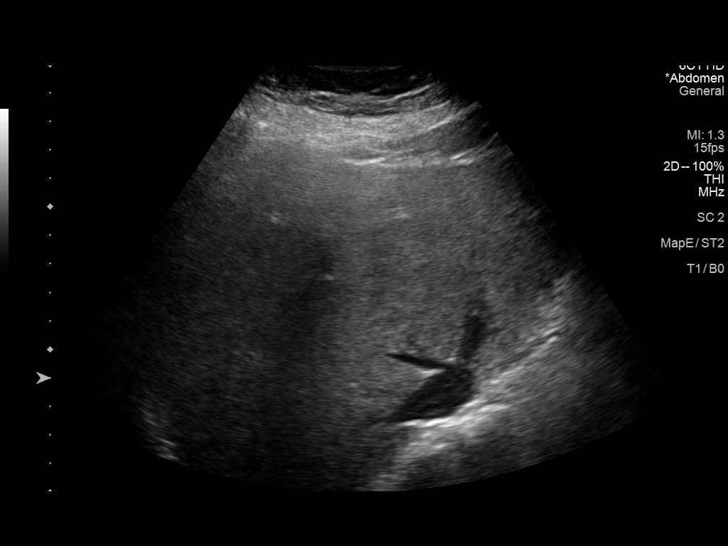
[im 53/64]
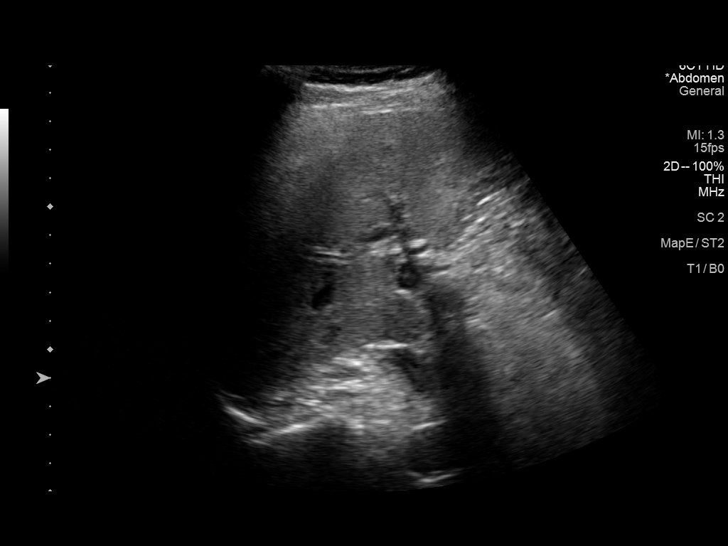
[im 58/64]
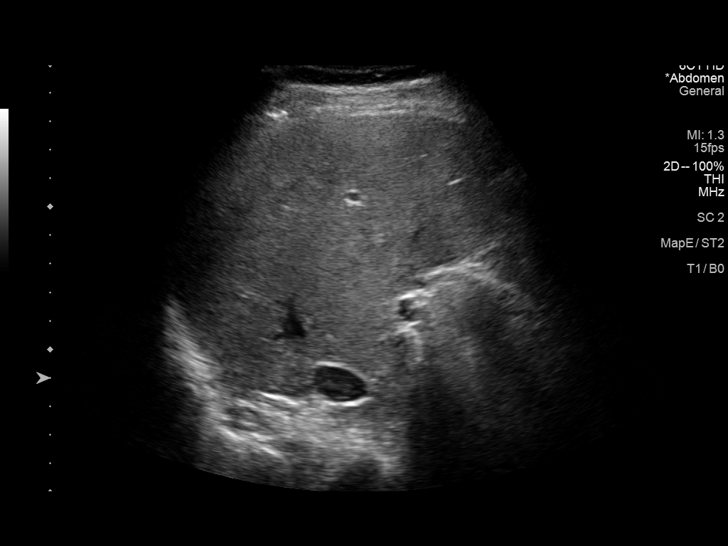
[im 64/64]
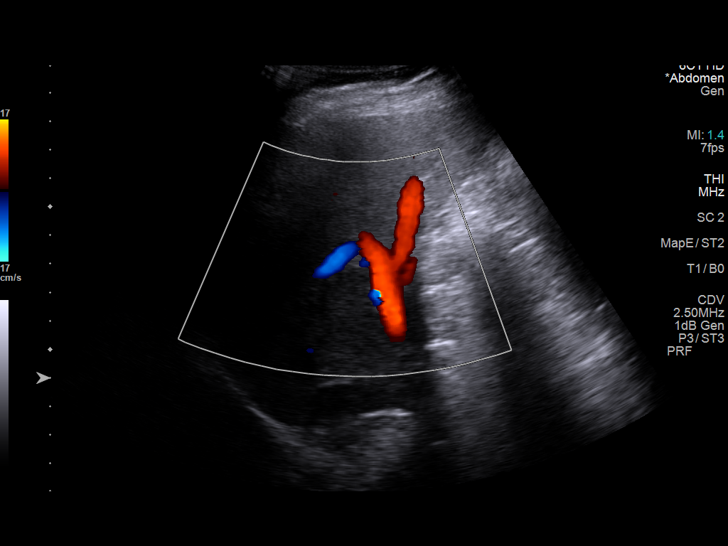

[14 of 25 positions shown; findings below may reference images not displayed]

FINDINGS: Gallbladder:

Surgically absent

Common bile duct:

Diameter: 6 mm

Liver:

No focal lesion.

Diffusely increased parenchymal echogenicity.

Portal vein is patent on color Doppler imaging with normal direction
of blood flow towards the liver.

Other: None.
IMPRESSION: Diffuse increased echogenicity of the hepatic parenchyma is a
nonspecific indicator of hepatocellular dysfunction, most commonly
steatosis.

## 2022-05-06 DIAGNOSIS — E039 Hypothyroidism, unspecified: Secondary | ICD-10-CM | POA: Diagnosis not present

## 2022-05-06 DIAGNOSIS — E785 Hyperlipidemia, unspecified: Secondary | ICD-10-CM | POA: Diagnosis not present

## 2022-05-06 DIAGNOSIS — I10 Essential (primary) hypertension: Secondary | ICD-10-CM | POA: Diagnosis not present

## 2022-05-06 DIAGNOSIS — N1831 Chronic kidney disease, stage 3a: Secondary | ICD-10-CM | POA: Diagnosis not present

## 2022-05-06 DIAGNOSIS — F419 Anxiety disorder, unspecified: Secondary | ICD-10-CM | POA: Diagnosis not present

## 2022-05-09 ENCOUNTER — Ambulatory Visit: Payer: Medicare Other | Attending: Cardiovascular Disease

## 2022-05-09 DIAGNOSIS — Z5181 Encounter for therapeutic drug level monitoring: Secondary | ICD-10-CM | POA: Insufficient documentation

## 2022-05-09 DIAGNOSIS — I480 Paroxysmal atrial fibrillation: Secondary | ICD-10-CM | POA: Diagnosis not present

## 2022-05-09 DIAGNOSIS — S92325A Nondisplaced fracture of second metatarsal bone, left foot, initial encounter for closed fracture: Secondary | ICD-10-CM | POA: Diagnosis not present

## 2022-05-09 DIAGNOSIS — S92345A Nondisplaced fracture of fourth metatarsal bone, left foot, initial encounter for closed fracture: Secondary | ICD-10-CM | POA: Diagnosis not present

## 2022-05-09 DIAGNOSIS — S92335A Nondisplaced fracture of third metatarsal bone, left foot, initial encounter for closed fracture: Secondary | ICD-10-CM | POA: Diagnosis not present

## 2022-05-09 LAB — POCT INR: INR: 2.1 (ref 2.0–3.0)

## 2022-05-09 NOTE — Patient Instructions (Signed)
Continue taking warfarin 1.5 tablets daily EXCEPT 1 tablet on Sundays and Wednesdays.  Recheck INR in 2 weeks. Stay consistent with greens (2 per week).  Coumadin Clinic [phone]

## 2022-05-13 ENCOUNTER — Other Ambulatory Visit: Payer: Self-pay | Admitting: Family Medicine

## 2022-05-13 DIAGNOSIS — M8580 Other specified disorders of bone density and structure, unspecified site: Secondary | ICD-10-CM

## 2022-05-15 ENCOUNTER — Inpatient Hospital Stay: Admission: RE | Admit: 2022-05-15 | Payer: Medicare Other | Source: Ambulatory Visit

## 2022-05-15 ENCOUNTER — Ambulatory Visit
Admission: RE | Admit: 2022-05-15 | Discharge: 2022-05-15 | Disposition: A | Discharge status: Home / Self Care | Payer: Medicare Other | Source: Ambulatory Visit | Attending: Family Medicine | Admitting: Family Medicine

## 2022-05-15 DIAGNOSIS — M810 Age-related osteoporosis without current pathological fracture: Secondary | ICD-10-CM | POA: Diagnosis not present

## 2022-05-15 DIAGNOSIS — Z780 Asymptomatic menopausal state: Secondary | ICD-10-CM | POA: Diagnosis not present

## 2022-05-15 DIAGNOSIS — M8580 Other specified disorders of bone density and structure, unspecified site: Secondary | ICD-10-CM

## 2022-05-15 DIAGNOSIS — M8589 Other specified disorders of bone density and structure, multiple sites: Secondary | ICD-10-CM | POA: Diagnosis not present

## 2022-05-23 ENCOUNTER — Ambulatory Visit: Payer: Medicare Other | Attending: Internal Medicine | Admitting: *Deleted

## 2022-05-23 ENCOUNTER — Ambulatory Visit: Payer: Medicare Other

## 2022-05-23 DIAGNOSIS — I480 Paroxysmal atrial fibrillation: Secondary | ICD-10-CM | POA: Insufficient documentation

## 2022-05-23 DIAGNOSIS — Z5181 Encounter for therapeutic drug level monitoring: Secondary | ICD-10-CM | POA: Insufficient documentation

## 2022-05-23 LAB — POCT INR: POC INR: 1.8

## 2022-05-23 NOTE — Patient Instructions (Signed)
Description   Take 2 tablets of warfarin today and then continue taking warfarin 1.5 tablets daily EXCEPT 1 tablet on Sundays and Wednesdays.  Recheck INR in 2 weeks. Stay consistent with greens (2 per week).  Coumadin Clinic [phone]

## 2022-06-02 NOTE — Progress Notes (Signed)
Remote pacemaker transmission.   

## 2022-06-06 ENCOUNTER — Ambulatory Visit: Payer: Medicare Other | Attending: Cardiology | Admitting: *Deleted

## 2022-06-06 DIAGNOSIS — Z5181 Encounter for therapeutic drug level monitoring: Secondary | ICD-10-CM | POA: Diagnosis not present

## 2022-06-06 DIAGNOSIS — I480 Paroxysmal atrial fibrillation: Secondary | ICD-10-CM | POA: Insufficient documentation

## 2022-06-06 LAB — POCT INR: POC INR: 2

## 2022-06-06 NOTE — Patient Instructions (Signed)
Description   START taking warfarin 1.5 tablets daily except for 1 tablets on Wednesday.  Recheck INR in 2 weeks. Stay consistent with greens (2 per week).  C[facility]madin Clinic [phone]

## 2022-06-20 ENCOUNTER — Ambulatory Visit: Payer: Medicare Other | Attending: Cardiovascular Disease | Admitting: *Deleted

## 2022-06-20 DIAGNOSIS — Z5181 Encounter for therapeutic drug level monitoring: Secondary | ICD-10-CM | POA: Diagnosis not present

## 2022-06-20 DIAGNOSIS — I480 Paroxysmal atrial fibrillation: Secondary | ICD-10-CM | POA: Diagnosis not present

## 2022-06-20 LAB — POCT INR: INR: 1.3 — AB (ref 2.0–3.0)

## 2022-06-20 NOTE — Patient Instructions (Signed)
Description   Today and tomorrow take 2 tablets of warfarin then START taking warfarin 1.5 tablets daily except for 2 tablets on Mondays and Fridays. Recheck INR in 1 week. Stay consistent with greens (2 per week). Coumadin Clinic [phone]

## 2022-06-23 DIAGNOSIS — J340 Abscess, furuncle and carbuncle of nose: Secondary | ICD-10-CM | POA: Diagnosis not present

## 2022-06-23 DIAGNOSIS — N289 Disorder of kidney and ureter, unspecified: Secondary | ICD-10-CM | POA: Diagnosis not present

## 2022-06-27 ENCOUNTER — Ambulatory Visit: Payer: Medicare Other | Attending: Cardiology | Admitting: Pharmacist

## 2022-06-27 DIAGNOSIS — I480 Paroxysmal atrial fibrillation: Secondary | ICD-10-CM

## 2022-06-27 DIAGNOSIS — Z5181 Encounter for therapeutic drug level monitoring: Secondary | ICD-10-CM

## 2022-06-27 LAB — POCT INR: INR: 2.8 (ref 2.0–3.0)

## 2022-06-27 NOTE — Patient Instructions (Addendum)
Description   Continue  taking warfarin 1.5 tablets daily except for 2 tablets Fridays. Recheck INR in 1 week. Stay consistent with greens (2 per week). Coumadin Clinic [phone]

## 2022-07-01 ENCOUNTER — Ambulatory Visit: Payer: Medicare Other | Attending: Cardiology | Admitting: Cardiology

## 2022-07-01 ENCOUNTER — Encounter: Payer: Self-pay | Admitting: Cardiology

## 2022-07-01 VITALS — BP 110/80 | HR 66 | Ht 65.0 in | Wt 149.0 lb

## 2022-07-01 DIAGNOSIS — I480 Paroxysmal atrial fibrillation: Secondary | ICD-10-CM | POA: Diagnosis not present

## 2022-07-01 DIAGNOSIS — I495 Sick sinus syndrome: Secondary | ICD-10-CM

## 2022-07-01 DIAGNOSIS — D6869 Other thrombophilia: Secondary | ICD-10-CM

## 2022-07-01 MED ORDER — METOPROLOL SUCCINATE ER 50 MG PO TB24: 50.0000 mg | ORAL_TABLET | Freq: Every day | ORAL | 3 refills | Status: DC

## 2022-07-01 NOTE — Patient Instructions (Signed)
Medication Instructions:  INCREASE Metoprolol Succinate (Toprol XL) to 50mg  once daily  *If you need a refill on your cardiac medications before your next appointment, please call your pharmacy*   Follow-Up: At [facility], you and your health needs are our priority.  As part of our continuing mission to provide you with exceptional heart care, we have created designated Provider Care Teams.  These Care Teams include your primary Cardiologist (physician) and Advanced Practice Providers (APPs -  Physician Assistants and Nurse Practitioners) who all work together to provide you with the care you need, when you need it.  We recommend signing up for the patient portal called "MyChart".  Sign up information is provided on this After Visit Summary.  MyChart is used to connect with patients for Virtual Visits (Telemedicine).  Patients are able to view lab/test results, encounter notes, upcoming appointments, etc.  Non-urgent messages can be sent to your provider as well.   To learn more about what you can do with MyChart, go to [email or web address].    Your next appointment:   1 year(s)  Provider:   You will see one of the following Advanced Practice Providers on your designated Care Team:   Tommye Standard, [place]

## 2022-07-01 NOTE — Progress Notes (Signed)
Electrophysiology Office Note   Date:  07/01/2022   ID:  Cathy, Bernard [birth year], MRN [id number]  PCP:  Aura Dials, MD  Cardiologist:   Primary Electrophysiologist:  Rawley Harju Meredith Leeds, MD    Chief Complaint: pacemaker   History of Present Illness: [name] is a 77 y.o. female who is being seen today for the evaluation of pacemaker at the request of Aura Dials, MD. Presenting today for electrophysiology evaluation.  For COPD, CVA, hypertension, hyperlipidemia, atrial fibrillation, tachybradycardia syndrome status post Medtronic dual-chamber pacemaker implanted in 2018.  Today, she denies symptoms of chest pain, shortness of breath, orthopnea, PND, lower extremity edema, claudication, dizziness, presyncope, syncope, bleeding, or neurologic sequela. The patient is tolerating medications without difficulties.  The past few weeks she has been having more palpitations.  She does not feel that this is due to atrial fibrillation.  They occur most often when she is playing with her dogs.  Aside from that she has no acute complaints.   Past Medical History:  Diagnosis Date   Acoustic neuroma ([place])    Hx of right ear acoustic neuroma, removed in 1999. No hearing in right ear.   Anxiety    Hx of, responds to Wellbutrin   Basal cell carcinoma    s/p MOHS surgery in 03/2011   Breast cancer ([place]) 2021   left lumpectomy   Cataract    Chronic anticoagulation 11/24/2016   Chronic cholecystitis with calculus 05/13/2013   Closed fracture of left lateral malleolus 08/10/2017   COPD (chronic obstructive pulmonary disease) ([place])    Deafness in right ear    Embolic stroke ([place]) [id number]   Family history of adverse reaction to anesthesia    Family hx of PONV   Fracture    left lateral malleolus fracture   GERD (gastroesophageal reflux disease)    Headache(784.0)    MIGRAINES   Hearing loss    Only in right ear, from an acoustic neuroma. S/P  removal in 1999.    History of hiatal hernia    History of neck pain    Responds to Flexeril   History of shingles    Hypercholesteremia    Hypertension    Hypothyroidism    Melanoma ([place]) 2018   right arm-surgery only   PAF (paroxysmal atrial fibrillation) ([place])    2 brief episodes of Afib recorded on monitor 02/2007   Paroxysmal atrial fibrillation ([facility])    PONV (postoperative nausea and vomiting)    Presence of permanent cardiac pacemaker 2018   for A-fib   Sick sinus syndrome ([place]) 12/19/2016   Sinus bradycardia    Tachycardia-bradycardia syndrome ([place]) 11/24/2016   TIA (transient ischemic attack) 2011   On coumadin. Asymptomatic, without recurrence.   Past Surgical History:  Procedure Laterality Date   ABDOMINAL HYSTERECTOMY     APPENDECTOMY     BREAST BIOPSY     X 3   BREAST CYST ASPIRATION Left 50 yrs ago per pt   BREAST EXCISIONAL BIOPSY Left 50 yrs ago   BREAST LUMPECTOMY WITH RADIOACTIVE SEED LOCALIZATION Left 06/21/2019   Procedure: LEFT BREAST LUMPECTOMY WITH RADIOACTIVE SEED LOCALIZATION;  Surgeon: Donnie Mesa, MD;  Location: [facility];  Service: General;  Laterality: Left;   CHOLECYSTECTOMY N/A 05/26/2013   Procedure: LAPAROSCOPIC CHOLECYSTECTOMY WITH INTRAOPERATIVE CHOLANGIOGRAM;  Surgeon: Imogene Burn. Georgette Dover, MD;  Location: [facility];  Service: General;  Laterality: N/A;   CRANIECTOMY FOR EXCISION OF ACOUSTIC NEUROMA Right 1999   INTRAOCULAR LENS INSERTION  Dr. Talbert Forest   MOHS SURGERY  03/2011   Basal cell skin cancer   ORIF ANKLE FRACTURE Left 08/10/2017   Procedure: OPEN REDUCTION INTERNAL FIXATION (ORIF) LEFT ANKLE FRACTURE;  Surgeon: Rod Can, MD;  Location: [facility];  Service: Orthopedics;  Laterality: Left;   PACEMAKER IMPLANT N/A 12/19/2016   Medtronic Azure XT MRI conditional dual-chamber pacemaker for symptomatic sinus bradycardia by Dr Rayann Heman   TONSILLECTOMY       Current Outpatient Medications  Medication Sig Dispense Refill    buPROPion (WELLBUTRIN XL) 150 MG 24 hr tablet Take 1 tablet by mouth daily.     Cholecalciferol (VITAMIN D3 PO) Take by mouth.     clonazePAM (KLONOPIN) 0.5 MG tablet Take 0.5 mg by mouth daily.     Docusate Sodium (DSS) 100 MG CAPS Take 1 tablet by mouth as needed.     enoxaparin (LOVENOX) 60 MG/0.6ML injection Inject 0.6 mLs (60 mg total) into the skin every 12 (twelve) hours. 12 mL 1   FLUoxetine (PROZAC) 10 MG capsule Take 10 mg by mouth daily.     fluticasone (CUTIVATE) 0.05 % cream as needed.     fluticasone (FLONASE) 50 MCG/ACT nasal spray Place 1 spray into both nostrils daily.     HYDROcodone-acetaminophen (NORCO/VICODIN) 5-325 MG tablet Take 1 tablet by mouth every 6 (six) hours as needed for severe pain. 10 tablet 0   JUBLIA 10 % SOLN apply to affected nails qd     levocetirizine (XYZAL) 5 MG tablet Take 1 tablet by mouth every evening.     levothyroxine (SYNTHROID) 100 MCG tablet Take 100 mcg by mouth every morning.     loratadine (CLARITIN) 10 MG tablet Take 10 mg by mouth daily as needed for allergies.     metoprolol succinate (TOPROL XL) 25 MG 24 hr tablet Take 0.5 tablets (12.5 mg total) by mouth daily. 45 tablet 0   pantoprazole (PROTONIX) 40 MG tablet Take 40 mg by mouth 2 (two) times daily.     REPATHA SURECLICK [id number] MG/ML SOAJ INJECT CONTENTS OF 1 PEN SUBCUTANEOUSLY EVERY 14 DAYS. 2 mL 11   sertraline (ZOLOFT) 100 MG tablet Take 100 mg by mouth daily. Pt takes 1 tablet once a day.     sucralfate (CARAFATE) 1 g tablet TAKE 1 TABLET BY MOUTH TWICE A DAY ON EMPTY STOMACH     vitamin B-12 (CYANOCOBALAMIN) 1000 MCG tablet 2 tablets     warfarin (COUMADIN) 2.5 MG tablet TAKE 1 AND 1/2 (ONE AND ONE HALF) TABLETS BY MOUTH DAILY AS DIRECTED BY COUMADIN CLINIC. 150 tablet 1   No current facility-administered medications for this visit.    Allergies:   Atorvastatin, Statins, Codeine, Ezetimibe, Latex, Penicillins, and Rosuvastatin   Social History:  The patient  reports that she has  never smoked. She has never used smokeless tobacco. She reports current alcohol use. She reports that she does not use drugs.   Family History:  The patient's family history includes Alcoholism in her father; Brain cancer in her maternal aunt; Breast cancer in her maternal aunt; CVA in an other family member; Cancer in her maternal grandmother; Diabetes in her mother; Healthy in her sister; Heart disease in an other family member; High Cholesterol in her mother; Hyperlipidemia in an other family member; Hypertension in her mother and another family member; Liver cancer in her father; Memory loss in her mother.    ROS:  Please see the history of present illness.   Otherwise, review of systems is  positive for none.   All other systems are reviewed and negative.    PHYSICAL EXAM: VS:  BP 110/80   Pulse 66   Ht 5\' 5"  (1.651 m)   Wt 149 lb (67.6 kg)   SpO2 96%   BMI 24.79 kg/m  , BMI Body mass index is 24.79 kg/m. GEN: Well nourished, well developed, in no acute distress  HEENT: normal  Neck: no JVD, carotid bruits, or masses Cardiac: RRR; no murmurs, rubs, or gallops,no edema  Respiratory:  clear to auscultation bilaterally, normal work of breathing GI: soft, nontender, nondistended, + BS MS: no deformity or atrophy  Skin: warm and dry, device pocket is well healed Neuro:  Strength and sensation are intact Psych: euthymic mood, full affect  EKG:  EKG is ordered today. Personal review of the ekg ordered shows atrial paced  Device interrogation is reviewed today in detail.  See PaceArt for details.   Recent Labs: 11/27/2021: ALT 47; BUN 18; Creatinine, Ser 1.15; Hemoglobin 13.9; Platelets 325; Potassium 4.8; Sodium 141    Lipid Panel     Component Value Date/Time   CHOL 222 (H) 12/07/2020 0855   TRIG 150 (H) 12/07/2020 0855   HDL 52 12/07/2020 0855   CHOLHDL 4.3 12/07/2020 0855   CHOLHDL 4 12/26/2013 0824   VLDL 44.8 (H) 12/26/2013 0824   LDLCALC 143 (H) 12/07/2020 0855    LDLDIRECT 106.6 12/26/2013 0824     Wt Readings from Last 3 Encounters:  07/01/22 149 lb (67.6 kg)  11/27/21 150 lb (68 kg)  11/13/21 150 lb (68 kg)      Other studies Reviewed: Additional studies/ records that were reviewed today include: Myoview 11/13/21  Review of the above records today demonstrates:    The study is normal. The study is low risk.   No ST deviation was noted.   LV perfusion is normal. There is no evidence of ischemia. There is no evidence of infarction.   Left ventricular function is normal. Nuclear stress EF: 75 %. The left ventricular ejection fraction is hyperdynamic (>65%). End diastolic cavity size is normal. End systolic cavity size is normal.   Prior study available for comparison from 12/11/2016.  TTE 05/28/20  1. Left ventricular ejection fraction, by estimation, is 55 to 60%. The  left ventricle has normal function. The left ventricle has no regional  wall motion abnormalities. There is mild asymmetric left ventricular  hypertrophy of the basal and septal  segments. Left ventricular diastolic parameters are indeterminate. The  average left ventricular global longitudinal strain is -17.4 %.   2. Pacing wires in RA/RV . Right ventricular systolic function is normal.  The right ventricular size is normal. There is normal pulmonary artery  systolic pressure.   3. Left atrial size was moderately dilated.   4. The mitral valve is normal in structure. Moderate mitral valve  regurgitation. No evidence of mitral stenosis.   5. The aortic valve was not well visualized. There is moderate  calcification of the aortic valve. There is moderate thickening of the  aortic valve. Aortic valve regurgitation is mild. Mild to moderate aortic  valve sclerosis/calcification is present,  without any evidence of aortic stenosis.   6. The inferior vena cava is normal in size with greater than 50%  respiratory variability, suggesting right atrial pressure of 3 mmHg.    ASSESSMENT AND PLAN:  1.  Tachy/ bradycardia syndrome: Status post Medtronic dual-chamber pacemaker.  Device functioning appropriately.  Thresholds, sensing, impedance within normal limits.  No changes.  2.  Paroxysmal atrial fibrillation: CHA2DS2-VASc of 6.  Currently on warfarin.  Low burden on device interrogation.  Is having palpitations.  Desiraye Rolfson increase Toprol-XL to 50 mg.  3.  Secondary hypercoagulable state: Currently on warfarin for atrial fibrillation    Current medicines are reviewed at length with the patient today.   The patient does not have concerns regarding her medicines.  The following changes were made today: Increase metoprolol  Labs/ tests ordered today include:  Orders Placed This Encounter  Procedures   EKG 12-Lead     Disposition:   FU 1 year  Signed, Jametta Moorehead Meredith Leeds, MD  07/01/2022 3:48 PM     [facility] [address] [place] [address] [phone] (office) [phone] (fax)

## 2022-07-02 LAB — CUP PACEART INCLINIC DEVICE CHECK
Date Time Interrogation Session: 20240326163351
Implantable Lead Connection Status: 753985
Implantable Lead Connection Status: 753985
Implantable Lead Implant Date: 20180914
Implantable Lead Implant Date: 20180914
Implantable Lead Location: 753859
Implantable Lead Location: 753860
Implantable Lead Model: 5076
Implantable Lead Model: 5076
Implantable Pulse Generator Implant Date: 20180914

## 2022-07-03 ENCOUNTER — Ambulatory Visit: Payer: Medicare Other | Attending: Cardiology

## 2022-07-03 DIAGNOSIS — Z5181 Encounter for therapeutic drug level monitoring: Secondary | ICD-10-CM

## 2022-07-03 DIAGNOSIS — I480 Paroxysmal atrial fibrillation: Secondary | ICD-10-CM | POA: Diagnosis not present

## 2022-07-03 LAB — POCT INR: INR: 2.2 (ref 2.0–3.0)

## 2022-07-03 NOTE — Patient Instructions (Signed)
Description   Take 2 tablets today and then continue  taking warfarin 1.5 tablets daily except for 2 tablets Fridays.  Recheck INR in 2 weeks.  Stay consistent with greens (2 per week).  [facility] [phone]

## 2022-07-08 DIAGNOSIS — Z23 Encounter for immunization: Secondary | ICD-10-CM | POA: Diagnosis not present

## 2022-07-08 DIAGNOSIS — S40812A Abrasion of left upper arm, initial encounter: Secondary | ICD-10-CM | POA: Diagnosis not present

## 2022-07-08 DIAGNOSIS — M25552 Pain in left hip: Secondary | ICD-10-CM | POA: Diagnosis not present

## 2022-07-08 DIAGNOSIS — M25572 Pain in left ankle and joints of left foot: Secondary | ICD-10-CM | POA: Diagnosis not present

## 2022-07-08 DIAGNOSIS — M25852 Other specified joint disorders, left hip: Secondary | ICD-10-CM | POA: Diagnosis not present

## 2022-07-08 DIAGNOSIS — M19072 Primary osteoarthritis, left ankle and foot: Secondary | ICD-10-CM | POA: Diagnosis not present

## 2022-07-09 DIAGNOSIS — H35373 Puckering of macula, bilateral: Secondary | ICD-10-CM | POA: Diagnosis not present

## 2022-07-10 DIAGNOSIS — S338XXA Sprain of other parts of lumbar spine and pelvis, initial encounter: Secondary | ICD-10-CM | POA: Diagnosis not present

## 2022-07-10 DIAGNOSIS — T148XXA Other injury of unspecified body region, initial encounter: Secondary | ICD-10-CM | POA: Diagnosis not present

## 2022-07-11 ENCOUNTER — Ambulatory Visit: Payer: Medicare Other | Attending: Cardiology | Admitting: Pharmacist

## 2022-07-11 ENCOUNTER — Other Ambulatory Visit: Payer: Self-pay | Admitting: Pharmacist

## 2022-07-11 DIAGNOSIS — I480 Paroxysmal atrial fibrillation: Secondary | ICD-10-CM

## 2022-07-11 DIAGNOSIS — Z5181 Encounter for therapeutic drug level monitoring: Secondary | ICD-10-CM | POA: Insufficient documentation

## 2022-07-11 LAB — POCT INR: INR: 3.7 — AB (ref 2.0–3.0)

## 2022-07-11 MED ORDER — REPATHA SURECLICK 140 MG/ML ~~LOC~~ SOAJ: 1.0000 mL | SUBCUTANEOUS | 11 refills | Status: DC

## 2022-07-11 NOTE — Patient Instructions (Signed)
Description   Hold dose today and then continue taking warfarin 1.5 tablets daily except for 2 tablets Fridays.  Recheck INR in 2 weeks.  Stay consistent with greens (2 per week).  [facility] Clinic [phone]

## 2022-07-18 ENCOUNTER — Ambulatory Visit: Payer: Medicare Other

## 2022-07-21 ENCOUNTER — Ambulatory Visit (INDEPENDENT_AMBULATORY_CARE_PROVIDER_SITE_OTHER): Payer: Medicare Other

## 2022-07-21 DIAGNOSIS — I495 Sick sinus syndrome: Secondary | ICD-10-CM

## 2022-07-22 LAB — CUP PACEART REMOTE DEVICE CHECK
Battery Remaining Longevity: 97 mo
Battery Voltage: 2.99 V
Brady Statistic AP VP Percent: 0.23 %
Brady Statistic AP VS Percent: 96.31 %
Brady Statistic AS VP Percent: 0.01 %
Brady Statistic AS VS Percent: 3.44 %
Brady Statistic RA Percent Paced: 96.52 %
Brady Statistic RV Percent Paced: 0.24 %
Date Time Interrogation Session: 20240414200730
Implantable Lead Connection Status: 753985
Implantable Lead Connection Status: 753985
Implantable Lead Implant Date: 20180914
Implantable Lead Implant Date: 20180914
Implantable Lead Location: 753859
Implantable Lead Location: 753860
Implantable Lead Model: 5076
Implantable Lead Model: 5076
Implantable Pulse Generator Implant Date: 20180914
Lead Channel Impedance Value: 323 Ohm
Lead Channel Impedance Value: 380 Ohm
Lead Channel Impedance Value: 437 Ohm
Lead Channel Impedance Value: 513 Ohm
Lead Channel Pacing Threshold Amplitude: 0.5 V
Lead Channel Pacing Threshold Amplitude: 0.75 V
Lead Channel Pacing Threshold Pulse Width: 0.4 ms
Lead Channel Pacing Threshold Pulse Width: 0.4 ms
Lead Channel Sensing Intrinsic Amplitude: 1.75 mV
Lead Channel Sensing Intrinsic Amplitude: 1.75 mV
Lead Channel Sensing Intrinsic Amplitude: 14.625 mV
Lead Channel Sensing Intrinsic Amplitude: 14.625 mV
Lead Channel Setting Pacing Amplitude: 1.5 V
Lead Channel Setting Pacing Amplitude: 2.5 V
Lead Channel Setting Pacing Pulse Width: 0.4 ms
Lead Channel Setting Sensing Sensitivity: 0.9 mV
Zone Setting Status: 755011
Zone Setting Status: 755011

## 2022-07-25 ENCOUNTER — Ambulatory Visit: Payer: Medicare Other | Attending: Cardiology | Admitting: *Deleted

## 2022-07-25 DIAGNOSIS — I480 Paroxysmal atrial fibrillation: Secondary | ICD-10-CM | POA: Insufficient documentation

## 2022-07-25 DIAGNOSIS — Z5181 Encounter for therapeutic drug level monitoring: Secondary | ICD-10-CM | POA: Insufficient documentation

## 2022-07-25 LAB — POCT INR: POC INR: 3.9

## 2022-07-25 NOTE — Patient Instructions (Signed)
Description   Hold dose today and then continue taking warfarin 1.5 tablets daily except for 2 tablets Fridays.  Recheck INR in 1 week.  Stay consistent with greens (2 per week).  [facility] [phone]

## 2022-08-01 ENCOUNTER — Ambulatory Visit: Payer: Medicare Other | Attending: Cardiology | Admitting: *Deleted

## 2022-08-01 DIAGNOSIS — Z5181 Encounter for therapeutic drug level monitoring: Secondary | ICD-10-CM | POA: Diagnosis not present

## 2022-08-01 DIAGNOSIS — I480 Paroxysmal atrial fibrillation: Secondary | ICD-10-CM

## 2022-08-01 LAB — POCT INR: POC INR: 7.5

## 2022-08-01 LAB — PROTIME-INR
INR: 6.6 (ref 0.9–1.2)
Prothrombin Time: 60.7 s — ABNORMAL HIGH (ref 9.1–12.0)

## 2022-08-01 NOTE — Patient Instructions (Signed)
Description   Hold warfarin 4/26, 4/27 and 4/28 and then START taking warfarin 1.5 tablets daily. Recheck INR in 1 week.  Stay consistent with greens (2 per week).  [facility] [phone]

## 2022-08-07 ENCOUNTER — Ambulatory Visit (INDEPENDENT_AMBULATORY_CARE_PROVIDER_SITE_OTHER): Payer: Medicare Other

## 2022-08-07 DIAGNOSIS — I480 Paroxysmal atrial fibrillation: Secondary | ICD-10-CM

## 2022-08-07 DIAGNOSIS — Z5181 Encounter for therapeutic drug level monitoring: Secondary | ICD-10-CM | POA: Diagnosis not present

## 2022-08-07 LAB — PROTIME-INR: INR: 2.3 — AB (ref 0.80–1.20)

## 2022-08-07 NOTE — Patient Instructions (Signed)
Description   Lab drawn on cruise - Spoke with pt and instructed to continue taking warfarin 1.5 tablets daily. Recheck INR in 1 week.  Stay consistent with greens (2 per week).  [facility] [phone]

## 2022-08-13 ENCOUNTER — Ambulatory Visit: Payer: Medicare Other | Attending: Cardiology | Admitting: *Deleted

## 2022-08-13 DIAGNOSIS — I480 Paroxysmal atrial fibrillation: Secondary | ICD-10-CM | POA: Diagnosis not present

## 2022-08-13 DIAGNOSIS — Z5181 Encounter for therapeutic drug level monitoring: Secondary | ICD-10-CM | POA: Diagnosis not present

## 2022-08-13 LAB — POCT INR: INR: 3.5 — AB (ref 2.0–3.0)

## 2022-08-13 NOTE — Patient Instructions (Signed)
Description   Do not take any warfarin today then  START taking warfarin 1.5 tablets daily except 1 tablet on Sunday and Wednesdays. Recheck INR in 1 week.  Stay consistent with greens (2 per week).  [facility] [phone]

## 2022-08-14 DIAGNOSIS — U071 COVID-19: Secondary | ICD-10-CM | POA: Diagnosis not present

## 2022-08-15 ENCOUNTER — Telehealth: Payer: Self-pay | Admitting: *Deleted

## 2022-08-15 NOTE — Telephone Encounter (Signed)
Pt called and stated that she has Covid. Pt stated that she is taking a nasal spray, an albuterol inhaler and is taking Lagevrio 200 mg capsule ( 4 capsules every 12 hours) for 5 days. Pt is coming in on 08/22/2022 to have INR checked.

## 2022-08-22 ENCOUNTER — Ambulatory Visit: Payer: Medicare Other | Attending: Cardiology

## 2022-08-22 DIAGNOSIS — Z5181 Encounter for therapeutic drug level monitoring: Secondary | ICD-10-CM | POA: Diagnosis not present

## 2022-08-22 DIAGNOSIS — I480 Paroxysmal atrial fibrillation: Secondary | ICD-10-CM | POA: Diagnosis not present

## 2022-08-22 LAB — POCT INR: INR: 5.4 — AB (ref 2.0–3.0)

## 2022-08-22 NOTE — Patient Instructions (Signed)
HOLD TODAY AND SATURDAY then continue taking warfarin 1.5 tablets daily except 1 tablet on Sunday and Wednesdays. Recheck INR in 1 week.  Stay consistent with greens (2 per week).  [facility] [phone]

## 2022-08-25 NOTE — Progress Notes (Signed)
Remote pacemaker transmission.   

## 2022-08-29 ENCOUNTER — Ambulatory Visit: Payer: Medicare Other | Attending: Cardiology

## 2022-08-29 DIAGNOSIS — Z5181 Encounter for therapeutic drug level monitoring: Secondary | ICD-10-CM | POA: Diagnosis not present

## 2022-08-29 DIAGNOSIS — I480 Paroxysmal atrial fibrillation: Secondary | ICD-10-CM | POA: Insufficient documentation

## 2022-08-29 LAB — POCT INR: INR: 3 (ref 2.0–3.0)

## 2022-08-29 NOTE — Patient Instructions (Signed)
continue taking warfarin 1.5 tablets daily except 1 tablet on Sunday and Wednesdays. Recheck INR in 1 week.  Stay consistent with greens (2 per week).  [facility] [phone]

## 2022-09-04 DIAGNOSIS — J069 Acute upper respiratory infection, unspecified: Secondary | ICD-10-CM | POA: Diagnosis not present

## 2022-09-09 ENCOUNTER — Ambulatory Visit: Payer: Medicare Other | Attending: Cardiology | Admitting: *Deleted

## 2022-09-09 DIAGNOSIS — Z5181 Encounter for therapeutic drug level monitoring: Secondary | ICD-10-CM

## 2022-09-09 DIAGNOSIS — I480 Paroxysmal atrial fibrillation: Secondary | ICD-10-CM

## 2022-09-09 LAB — POCT INR: POC INR: 3.3

## 2022-09-09 NOTE — Patient Instructions (Addendum)
Description    Hold warfarin today then continue taking warfarin 1.5 tablets daily except 1 tablet on Sunday and Wednesdays. Recheck INR in 1 week.  Stay consistent with greens (2 per week).  [facility] [phone]

## 2022-09-12 ENCOUNTER — Other Ambulatory Visit: Payer: Self-pay | Admitting: Cardiothoracic Surgery

## 2022-09-12 DIAGNOSIS — I7121 Aneurysm of the ascending aorta, without rupture: Secondary | ICD-10-CM

## 2022-09-17 ENCOUNTER — Ambulatory Visit: Payer: Medicare Other | Attending: Cardiology | Admitting: *Deleted

## 2022-09-17 DIAGNOSIS — Z5181 Encounter for therapeutic drug level monitoring: Secondary | ICD-10-CM | POA: Diagnosis not present

## 2022-09-17 DIAGNOSIS — I480 Paroxysmal atrial fibrillation: Secondary | ICD-10-CM | POA: Diagnosis not present

## 2022-09-17 LAB — POCT INR: INR: 2 (ref 2.0–3.0)

## 2022-09-17 NOTE — Patient Instructions (Signed)
Description   Continue taking warfarin 1.5 tablets daily except 1 tablet on Sunday and Wednesdays. Recheck INR in 2 weeks. Stay consistent with greens (2 per week).  [facility] [phone]

## 2022-09-18 ENCOUNTER — Ambulatory Visit: Payer: Medicare Other

## 2022-10-01 ENCOUNTER — Ambulatory Visit: Payer: Medicare Other | Attending: Cardiology

## 2022-10-01 DIAGNOSIS — Z5181 Encounter for therapeutic drug level monitoring: Secondary | ICD-10-CM | POA: Insufficient documentation

## 2022-10-01 DIAGNOSIS — I480 Paroxysmal atrial fibrillation: Secondary | ICD-10-CM | POA: Insufficient documentation

## 2022-10-01 LAB — POCT INR: INR: 1.6 — AB (ref 2.0–3.0)

## 2022-10-01 NOTE — Patient Instructions (Signed)
Description   Take 2 tablets today and then START taking 1.5 tablets daily. Recheck INR in 2 weeks. Stay consistent with greens (2 per week).  [facility] [phone]

## 2022-10-15 ENCOUNTER — Ambulatory Visit: Payer: Medicare Other | Attending: Cardiology

## 2022-10-15 DIAGNOSIS — I480 Paroxysmal atrial fibrillation: Secondary | ICD-10-CM | POA: Diagnosis not present

## 2022-10-15 DIAGNOSIS — Z5181 Encounter for therapeutic drug level monitoring: Secondary | ICD-10-CM | POA: Insufficient documentation

## 2022-10-15 LAB — POCT INR: INR: 2.4 (ref 2.0–3.0)

## 2022-10-15 NOTE — Patient Instructions (Signed)
Description   Continue taking 1.5 tablets daily. Recheck INR in 3 weeks. Stay consistent with greens (2 per week).  [facility] [phone]

## 2022-10-17 DIAGNOSIS — S92325A Nondisplaced fracture of second metatarsal bone, left foot, initial encounter for closed fracture: Secondary | ICD-10-CM | POA: Diagnosis not present

## 2022-10-17 DIAGNOSIS — S92335A Nondisplaced fracture of third metatarsal bone, left foot, initial encounter for closed fracture: Secondary | ICD-10-CM | POA: Diagnosis not present

## 2022-10-17 DIAGNOSIS — S92345A Nondisplaced fracture of fourth metatarsal bone, left foot, initial encounter for closed fracture: Secondary | ICD-10-CM | POA: Diagnosis not present

## 2022-10-18 DIAGNOSIS — Z91038 Other insect allergy status: Secondary | ICD-10-CM | POA: Diagnosis not present

## 2022-10-20 ENCOUNTER — Ambulatory Visit (INDEPENDENT_AMBULATORY_CARE_PROVIDER_SITE_OTHER): Payer: Medicare Other

## 2022-10-20 DIAGNOSIS — I495 Sick sinus syndrome: Secondary | ICD-10-CM

## 2022-10-20 LAB — CUP PACEART REMOTE DEVICE CHECK
Battery Remaining Longevity: 93 mo
Battery Voltage: 2.98 V
Brady Statistic AP VP Percent: 0.2 %
Brady Statistic AP VS Percent: 95.78 %
Brady Statistic AS VP Percent: 0 %
Brady Statistic AS VS Percent: 4.02 %
Brady Statistic RA Percent Paced: 95.27 %
Brady Statistic RV Percent Paced: 0.21 %
Date Time Interrogation Session: 20240714224616
Implantable Lead Connection Status: 753985
Implantable Lead Connection Status: 753985
Implantable Lead Implant Date: 20180914
Implantable Lead Implant Date: 20180914
Implantable Lead Location: 753859
Implantable Lead Location: 753860
Implantable Lead Model: 5076
Implantable Lead Model: 5076
Implantable Pulse Generator Implant Date: 20180914
Lead Channel Impedance Value: 323 Ohm
Lead Channel Impedance Value: 380 Ohm
Lead Channel Impedance Value: 399 Ohm
Lead Channel Impedance Value: 494 Ohm
Lead Channel Pacing Threshold Amplitude: 0.625 V
Lead Channel Pacing Threshold Amplitude: 0.875 V
Lead Channel Pacing Threshold Pulse Width: 0.4 ms
Lead Channel Pacing Threshold Pulse Width: 0.4 ms
Lead Channel Sensing Intrinsic Amplitude: 12.625 mV
Lead Channel Sensing Intrinsic Amplitude: 12.625 mV
Lead Channel Sensing Intrinsic Amplitude: 2.875 mV
Lead Channel Sensing Intrinsic Amplitude: 2.875 mV
Lead Channel Setting Pacing Amplitude: 1.5 V
Lead Channel Setting Pacing Amplitude: 2.5 V
Lead Channel Setting Pacing Pulse Width: 0.4 ms
Lead Channel Setting Sensing Sensitivity: 0.9 mV
Zone Setting Status: 755011
Zone Setting Status: 755011

## 2022-10-30 ENCOUNTER — Ambulatory Visit (INDEPENDENT_AMBULATORY_CARE_PROVIDER_SITE_OTHER): Payer: Medicare Other | Admitting: Physician Assistant

## 2022-10-30 ENCOUNTER — Encounter: Payer: Self-pay | Admitting: Physician Assistant

## 2022-10-30 ENCOUNTER — Ambulatory Visit
Admission: RE | Admit: 2022-10-30 | Discharge: 2022-10-30 | Disposition: A | Discharge status: Home / Self Care | Payer: Medicare Other | Source: Ambulatory Visit | Attending: Cardiothoracic Surgery | Admitting: Cardiothoracic Surgery

## 2022-10-30 VITALS — BP 105/67 | HR 88 | Resp 20 | Ht 65.0 in | Wt 140.0 lb

## 2022-10-30 DIAGNOSIS — I7121 Aneurysm of the ascending aorta, without rupture: Secondary | ICD-10-CM

## 2022-10-30 NOTE — Progress Notes (Signed)
[address]       [place] [address]             [phone]       HPI: [name] is a 77 yo female with known history of HTN, HLD, COPD, Hypothyroidism, Embolic Stroke, PAF on anticoagulation, Tachy/Brady Syndrome with PPM in place, and Ascending Aortic Aneurysm. She was initially seen in our office about a 2 years ago by Dr. Vickey Sages at which time her aneurysm measured 4 cm. A prior CT in 2017 showed a 3.9cm ascending aorta.  Follow up CTA last year showed a relatively stable thoracic aortic aneurysm at 4.1cm.   She returns today for annual surveillance. She reports having a few very brief episodes of chest pain in the past few months. She said she plans to discuss this at her upcoming cardiology appointment nest week.  She otherwise had no new concerns.   Current Outpatient Medications  Medication Sig Dispense Refill   buPROPion (WELLBUTRIN XL) 150 MG 24 hr tablet Take 1 tablet by mouth daily.     Cholecalciferol (VITAMIN D3 PO) Take by mouth.     clonazePAM (KLONOPIN) 0.5 MG tablet Take 0.5 mg by mouth daily.     Docusate Sodium (DSS) 100 MG CAPS Take 1 tablet by mouth as needed.     enoxaparin (LOVENOX) 60 MG/0.6ML injection Inject 0.6 mLs (60 mg total) into the skin every 12 (twelve) hours. 12 mL 1   Evolocumab (REPATHA SURECLICK) 140 MG/ML SOAJ Inject 140 mg into the skin every 14 (fourteen) days. 2 mL 11   FLUoxetine (PROZAC) 10 MG capsule Take 10 mg by mouth daily.     fluticasone (CUTIVATE) 0.05 % cream as needed.     fluticasone (FLONASE) 50 MCG/ACT nasal spray Place 1 spray into both nostrils daily.     HYDROcodone-acetaminophen (NORCO/VICODIN) 5-325 MG tablet Take 1 tablet by mouth every 6 (six) hours as needed for severe pain. 10 tablet 0   JUBLIA 10 % SOLN apply to affected nails qd     levocetirizine (XYZAL) 5 MG tablet Take 1 tablet by mouth every evening.     levothyroxine (SYNTHROID) 100 MCG tablet Take 100 mcg by mouth every morning.      loratadine (CLARITIN) 10 MG tablet Take 10 mg by mouth daily as needed for allergies.     metoprolol succinate (TOPROL-XL) 50 MG 24 hr tablet Take 1 tablet (50 mg total) by mouth daily. Take with or immediately following a meal. 90 tablet 3   pantoprazole (PROTONIX) 40 MG tablet Take 40 mg by mouth 2 (two) times daily.     sertraline (ZOLOFT) 100 MG tablet Take 100 mg by mouth daily. Pt takes 1 tablet once a day.     sucralfate (CARAFATE) 1 g tablet TAKE 1 TABLET BY MOUTH TWICE A DAY ON EMPTY STOMACH     vitamin B-12 (CYANOCOBALAMIN) 1000 MCG tablet 2 tablets     warfarin (COUMADIN) 2.5 MG tablet TAKE 1 AND 1/2 (ONE AND ONE HALF) TABLETS BY MOUTH DAILY AS DIRECTED BY COUMADIN CLINIC. 150 tablet 1   No current facility-administered medications for this visit.    Physical Exam: Vital Signs BP 105/67 HR  88 RR 20 SpO2 95%  General: no distress Heart: regular rhythm Chest: normal work of breathing at rest. Exts: no edema  Diagnostic Tests: EXAM: CT CHEST WITHOUT CONTRAST  TECHNIQUE: Multidetector CT imaging of the chest was performed following the standard protocol without  IV contrast.  RADIATION DOSE REDUCTION: This exam was performed according to the departmental dose-optimization program which includes automated exposure control, adjustment of the mA and/or kV according to patient size and/or use of iterative reconstruction technique.  COMPARISON: Chest CT dated October 28, 2021  FINDINGS: Cardiovascular: Normal heart size. No pericardial effusion. Mildly dilated ascending thoracic aorta measuring up to 4.1 cm, unchanged when compared with the prior exam. Mild calcified plaque of the ascending thoracic aorta. Mild coronary artery calcifications. Mild mitral annular calcifications. Left chest wall dual lead pacer with leads in the right atrium and right ventricle.  Mediastinum/Nodes: Esophagus and thyroid are unremarkable. No enlarged lymph nodes seen in the  chest.  Lungs/Pleura: Central airways are patent. Right-greater-than-left lower lobe interstitial thickening and ground-glass opacities, likely due to scarring or atelectasis. No consolidation, pleural effusion or pneumothorax.  Upper Abdomen: Prior cholecystectomy. No acute abnormality.  Musculoskeletal: No chest wall mass or suspicious bone lesions identified.  IMPRESSION: 1. Mildly dilated ascending thoracic aorta measuring up to 4.1 cm, unchanged when compared with the prior exam. Recommend annual imaging followup by CTA or MRA. This recommendation follows 2010 ACCF/AHA/AATS/ACR/ASA/SCA/SCAI/SIR/STS/SVM Guidelines for the Diagnosis and Management of Patients with Thoracic Aortic Disease. Circulation. 2010; 121: Z610-R604. Aortic aneurysm NOS (ICD10-I71.9) 2. Coronary artery calcifications aortic Atherosclerosis (ICD10-I70.0).   Electronically Signed By: Allegra Lai M.D. On: 10/30/2022 12:29   Impression / Plan: Stable asymptomatic thoracic aortic aneurysm found incidentally 2 years ago although she had a 3.9cm ascending aortic dimension as far back as 2017. BP is well controlled by today's measurement.  We discussed again the importance of annual surveillance, avoidance of strenuous activities, avoidance of fluoroquinolone antibiotics, and careful BP and lipid management.   Follow up 1 year with CTA chest.   Leary Roca, PA-C [facility] [phone]

## 2022-10-30 NOTE — Patient Instructions (Signed)
Risk Modification in those with ascending thoracic aortic aneurysm:  Continue good control of blood pressure (prefer SBP 130/80 or less)  2. Avoid fluoroquinolone antibiotics (I.e Ciprofloxacin, Avelox, Levofloxacin, Ofloxacin)  3.  Good control of cholesterol (to decrease cardiovascular risk)  4.  Exercise and activity limitations is individualized, but in general, contact sports are to be avoided and one should avoid heavy lifting (defined as half of ideal body weight) and exercises involving sustained Valsalva maneuver.  5. Annual follow up with DT of chest

## 2022-10-30 NOTE — Progress Notes (Deleted)
      [address]       [place] [address]             [phone]       

## 2022-11-01 NOTE — Progress Notes (Unsigned)
Cardiology Office Note:   Date:  11/03/2022  ID:  [name], DOB Mar 20, 1946, MRN [id number]  History of Present Illness:   Trechelle Bernard is a 77 y.o. female with history of COPD, CVA, HTN, HLD, tachy-brady syndrome s/p PPM placement, Afib, mild ascending aortic dilation who was previously followed by Dr. Katrinka Blazing who now returns to clinic for follow-up.  Was seen as pre-op visit prior to foot surgery. Myoview 11/2021 normal and low risk. Last TTE 06/2020 with LVEF 55-60%, normal RV, moderate LAE, moderate MR, mild AR.   Today, the patient overall feels well. Had COVID in Spring and since that time, she has had intermittent episodes of chest pain. Pain feels like a pressure in her chest. Symptoms are not exertional in nature. Episodes last seconds before abating. Also has occasional episodes of palpitations/tachycardia with slightly increased duration of Aflutter with longest episode of 4 hours.   Otherwise, no orthopnea, PND, LE edema, dizziness or syncope. No exertional chest pain. Compliant with medications.   Past Medical History:  Diagnosis Date   Acoustic neuroma (HCC)    Hx of right ear acoustic neuroma, removed in 1999. No hearing in right ear.   Anxiety    Hx of, responds to Wellbutrin   Basal cell carcinoma    s/p MOHS surgery in 03/2011   Breast cancer (HCC) 2021   left lumpectomy   Cataract    Chronic anticoagulation 11/24/2016   Chronic cholecystitis with calculus 05/13/2013   Closed fracture of left lateral malleolus 08/10/2017   COPD (chronic obstructive pulmonary disease) (HCC)    Deafness in right ear    Embolic stroke (HCC) 11/24/2016   Family history of adverse reaction to anesthesia    Family hx of PONV   Fracture    left lateral malleolus fracture   GERD (gastroesophageal reflux disease)    Headache(784.0)    MIGRAINES   Hearing loss    Only in right ear, from an acoustic neuroma. S/P removal in 1999.    History of hiatal hernia     History of neck pain    Responds to Flexeril   History of shingles    Hypercholesteremia    Hypertension    Hypothyroidism    Melanoma (HCC) 2018   right arm-surgery only   PAF (paroxysmal atrial fibrillation) (HCC)    2 brief episodes of Afib recorded on monitor 02/2007   Paroxysmal atrial fibrillation (HCC)    PONV (postoperative nausea and vomiting)    Presence of permanent cardiac pacemaker 2018   for A-fib   Sick sinus syndrome (HCC) 12/19/2016   Sinus bradycardia    Tachycardia-bradycardia syndrome (HCC) 11/24/2016   TIA (transient ischemic attack) 2011   On coumadin. Asymptomatic, without recurrence.     ROS: As per HPI  Studies Reviewed:    EKG:  No new tracing today  Cardiac Studies & Procedures     STRESS TESTS  MYOCARDIAL PERFUSION IMAGING 11/13/2021  Narrative   The study is normal. The study is low risk.   No ST deviation was noted.   LV perfusion is normal. There is no evidence of ischemia. There is no evidence of infarction.   Left ventricular function is normal. Nuclear stress EF: 75 %. The left ventricular ejection fraction is hyperdynamic (>65%). End diastolic cavity size is normal. End systolic cavity size is normal.   Prior study available for comparison from 12/11/2016.   ECHOCARDIOGRAM  ECHOCARDIOGRAM COMPLETE 06/25/2020  Narrative ECHOCARDIOGRAM REPORT    Patient  Name:   [name] Date of Exam: 06/25/2020 Medical Rec #:  [id number]          Height:       65.0 in Accession #:    [id number]         Weight:       152.4 lb Date of Birth:  10/31/1945          BSA:          1.762 m Patient Age:    75 years           BP:           122/87 mmHg Patient Gender: F                  HR:           69 bpm. Exam Location:  [address]  Procedure: 2D Echo, Cardiac Doppler, Color Doppler and Strain Analysis  Indications:    R06.00 SOB  History:        Patient has prior history of Echocardiogram examinations, most recent 12/02/2016. TIA, Stroke and  COPD, Arrythmias:Atrial Fibrillation; Risk Factors:Hypertension.  Sonographer:    Clearence Ped RCS Referring Phys: [id number] RENEE LYNN URSUY  IMPRESSIONS   1. Left ventricular ejection fraction, by estimation, is 55 to 60%. The left ventricle has normal function. The left ventricle has no regional wall motion abnormalities. There is mild asymmetric left ventricular hypertrophy of the basal and septal segments. Left ventricular diastolic parameters are indeterminate. The average left ventricular global longitudinal strain is -17.4 %. 2. Pacing wires in RA/RV . Right ventricular systolic function is normal. The right ventricular size is normal. There is normal pulmonary artery systolic pressure. 3. Left atrial size was moderately dilated. 4. The mitral valve is normal in structure. Moderate mitral valve regurgitation. No evidence of mitral stenosis. 5. The aortic valve was not well visualized. There is moderate calcification of the aortic valve. There is moderate thickening of the aortic valve. Aortic valve regurgitation is mild. Mild to moderate aortic valve sclerosis/calcification is present, without any evidence of aortic stenosis. 6. The inferior vena cava is normal in size with greater than 50% respiratory variability, suggesting right atrial pressure of 3 mmHg.  FINDINGS Left Ventricle: Left ventricular ejection fraction, by estimation, is 55 to 60%. The left ventricle has normal function. The left ventricle has no regional wall motion abnormalities. The average left ventricular global longitudinal strain is -17.4 %. 3D left ventricular ejection fraction analysis performed but not reported based on interpreter judgement due to suboptimal quality. The left ventricular internal cavity size was normal in size. There is mild asymmetric left ventricular hypertrophy of the basal and septal segments. Left ventricular diastolic parameters are indeterminate.  Right Ventricle: Pacing wires in RA/RV.  The right ventricular size is normal. No increase in right ventricular wall thickness. Right ventricular systolic function is normal. There is normal pulmonary artery systolic pressure. The tricuspid regurgitant velocity is 2.04 m/s, and with an assumed right atrial pressure of 3 mmHg, the estimated right ventricular systolic pressure is 19.6 mmHg.  Left Atrium: Left atrial size was moderately dilated.  Right Atrium: Right atrial size was normal in size.  Pericardium: There is no evidence of pericardial effusion.  Mitral Valve: The mitral valve is normal in structure. Moderate mitral valve regurgitation. No evidence of mitral valve stenosis.  Tricuspid Valve: The tricuspid valve is normal in structure. Tricuspid valve regurgitation is mild . No evidence of tricuspid stenosis.  Aortic  Valve: The aortic valve was not well visualized. There is moderate calcification of the aortic valve. There is moderate thickening of the aortic valve. Aortic valve regurgitation is mild. Aortic regurgitation PHT measures 877 msec. Mild to moderate aortic valve sclerosis/calcification is present, without any evidence of aortic stenosis.  Pulmonic Valve: The pulmonic valve was normal in structure. Pulmonic valve regurgitation is not visualized. No evidence of pulmonic stenosis.  Aorta: The aortic root is normal in size and structure.  Venous: The inferior vena cava is normal in size with greater than 50% respiratory variability, suggesting right atrial pressure of 3 mmHg.  IAS/Shunts: No atrial level shunt detected by color flow Doppler.   LEFT VENTRICLE PLAX 2D LVIDd:         4.40 cm  Diastology LVIDs:         3.00 cm  LV e' medial:    6.20 cm/s LV PW:         0.80 cm  LV E/e' medial:  17.2 LV IVS:        1.00 cm  LV e' lateral:   13.60 cm/s LVOT diam:     2.00 cm  LV E/e' lateral: 7.8 LV SV:         54 LV SV Index:   31       2D Longitudinal Strain LVOT Area:     3.14 cm 2D Strain GLS Avg:     -17.4  %   RIGHT VENTRICLE RV Basal diam:  3.50 cm RV S prime:     8.49 cm/s RVSP:           19.6 mmHg  LEFT ATRIUM             Index       RIGHT ATRIUM           Index LA diam:        4.30 cm 2.44 cm/m  RA Pressure: 3.00 mmHg LA Vol (A2C):   68.9 ml 39.10 ml/m RA Area:     11.60 cm LA Vol (A4C):   27.7 ml 15.72 ml/m RA Volume:   27.30 ml  15.49 ml/m LA Biplane Vol: 45.8 ml 25.99 ml/m AORTIC VALVE LVOT Vmax:   80.90 cm/s LVOT Vmean:  51.000 cm/s LVOT VTI:    0.172 m AI PHT:      877 msec  AORTA Ao Root diam: 3.20 cm Ao Asc diam:  3.30 cm  MITRAL VALVE                 TRICUSPID VALVE MV Area (PHT):               TR Peak grad:   16.6 mmHg MV Decel Time:               TR Vmax:        204.00 cm/s MR Peak grad:    76.4 mmHg   Estimated RAP:  3.00 mmHg MR Mean grad:    55.0 mmHg   RVSP:           19.6 mmHg MR Vmax:         437.00 cm/s MR Vmean:        354.0 cm/s  SHUNTS MR PISA:         1.57 cm    Systemic VTI:  0.17 m MR PISA Eff ROA: 11 mm      Systemic Diam: 2.00 cm MR PISA Radius:  0.50 cm MV E velocity: 106.50 cm/s  Charlton Haws MD Electronically signed by Charlton Haws MD Signature Date/Time: 06/25/2020/3:01:35 PM    Final    MONITORS  CARDIAC EVENT MONITOR 08/08/2014            Risk Assessment/Calculations:    CHA2DS2-VASc Score = 6  This indicates a 9.7% annual risk of stroke. The patient's score is based upon: CHF History: 0 HTN History: 0 Diabetes History: 0 Stroke History: 2 Vascular Disease History: 1 Age Score: 2 Gender Score: 1           Physical Exam:   VS:  BP 102/82   Pulse 65   Ht 5\' 5"  (1.651 m)   Wt 143 lb (64.9 kg)   SpO2 94%   BMI 23.80 kg/m    Wt Readings from Last 3 Encounters:  11/03/22 143 lb (64.9 kg)  10/30/22 140 lb (63.5 kg)  07/01/22 149 lb (67.6 kg)     GEN: Well nourished, well developed in no acute distress NECK: No JVD; No carotid bruits CARDIAC: RRR, no murmurs, rubs, gallops RESPIRATORY:  Clear to  auscultation without rales, wheezing or rhonchi  ABDOMEN: Soft, non-tender, non-distended EXTREMITIES:  No edema; No deformity   ASSESSMENT AND PLAN:   #Paroxsymal Afib: -CHADs-vasc 6 -Continues to have episodes of palpitations which are likely not related to Afib/flutter based on most recent device interrogation given daily symptoms  -Continue metop 50mg  XL daily -Start metoprolol 12.5mg  BID prn for palpitations -Continue warfrin for [facility]  #Tachy-Brady Syndrome: -S/p PPM placement -Follows with Dr. Elberta Fortis  #HLD: #Aortic Atherosclerosis: -Continue repatha  -Lipids per PCP  #Mild Ascending Aortic Dilation: -Measures 4.1cm on CT -Continue serial monitoring        Signed, Meriam Sprague, MD

## 2022-11-03 ENCOUNTER — Ambulatory Visit: Payer: Medicare Other | Attending: Cardiology | Admitting: Cardiology

## 2022-11-03 ENCOUNTER — Encounter: Payer: Self-pay | Admitting: Cardiology

## 2022-11-03 VITALS — BP 102/82 | HR 65 | Ht 65.0 in | Wt 143.0 lb

## 2022-11-03 DIAGNOSIS — I700 Atherosclerosis of aorta: Secondary | ICD-10-CM | POA: Insufficient documentation

## 2022-11-03 DIAGNOSIS — Z7901 Long term (current) use of anticoagulants: Secondary | ICD-10-CM | POA: Diagnosis not present

## 2022-11-03 DIAGNOSIS — I495 Sick sinus syndrome: Secondary | ICD-10-CM | POA: Insufficient documentation

## 2022-11-03 DIAGNOSIS — D6869 Other thrombophilia: Secondary | ICD-10-CM | POA: Diagnosis not present

## 2022-11-03 DIAGNOSIS — I480 Paroxysmal atrial fibrillation: Secondary | ICD-10-CM | POA: Diagnosis not present

## 2022-11-03 MED ORDER — METOPROLOL TARTRATE 25 MG PO TABS: 12.5000 mg | ORAL_TABLET | Freq: Two times a day (BID) | ORAL | 5 refills | Status: DC | PRN

## 2022-11-03 NOTE — Patient Instructions (Signed)
Medication Instructions:   START TAKING METOPROLOL TARTRATE 12.5 MG BY MOUTH TWICE DAILY AS NEEDED FOR PALPITATIONS  *If you need a refill on your cardiac medications before your next appointment, please call your pharmacy*   Follow-Up: At [facility], you and your health needs are our priority.  As part of our continuing mission to provide you with exceptional heart care, we have created designated Provider Care Teams.  These Care Teams include your primary Cardiologist (physician) and Advanced Practice Providers (APPs -  Physician Assistants and Nurse Practitioners) who all work together to provide you with the care you need, when you need it.  We recommend signing up for the patient portal called "MyChart".  Sign up information is provided on this After Visit Summary.  MyChart is used to connect with patients for Virtual Visits (Telemedicine).  Patients are able to view lab/test results, encounter notes, upcoming appointments, etc.  Non-urgent messages can be sent to your provider as well.   To learn more about what you can do with MyChart, go to [email or web address].    Your next appointment:   6 month(s)  Provider:   DR. Epifanio Lesches

## 2022-11-03 NOTE — Progress Notes (Signed)
Remote pacemaker transmission.   

## 2022-11-04 ENCOUNTER — Telehealth: Payer: Self-pay

## 2022-11-04 NOTE — Telephone Encounter (Signed)
Lpm informing her that we have nothing available today or early Wednesday for INR check.

## 2022-11-05 ENCOUNTER — Ambulatory Visit: Payer: Medicare Other | Attending: Cardiology

## 2022-11-05 DIAGNOSIS — I480 Paroxysmal atrial fibrillation: Secondary | ICD-10-CM | POA: Diagnosis not present

## 2022-11-05 DIAGNOSIS — Z5181 Encounter for therapeutic drug level monitoring: Secondary | ICD-10-CM | POA: Insufficient documentation

## 2022-11-05 LAB — POCT INR: INR: 2.8 (ref 2.0–3.0)

## 2022-11-05 NOTE — Patient Instructions (Signed)
Description   Continue taking 1.5 tablets daily. Recheck INR in 4 weeks. Stay consistent with greens (2 per week).  [facility] [phone]

## 2022-11-20 DIAGNOSIS — Z872 Personal history of diseases of the skin and subcutaneous tissue: Secondary | ICD-10-CM | POA: Diagnosis not present

## 2022-11-20 DIAGNOSIS — Z09 Encounter for follow-up examination after completed treatment for conditions other than malignant neoplasm: Secondary | ICD-10-CM | POA: Diagnosis not present

## 2022-11-20 DIAGNOSIS — L538 Other specified erythematous conditions: Secondary | ICD-10-CM | POA: Diagnosis not present

## 2022-11-20 DIAGNOSIS — D485 Neoplasm of uncertain behavior of skin: Secondary | ICD-10-CM | POA: Diagnosis not present

## 2022-11-20 DIAGNOSIS — L820 Inflamed seborrheic keratosis: Secondary | ICD-10-CM | POA: Diagnosis not present

## 2022-11-20 DIAGNOSIS — D225 Melanocytic nevi of trunk: Secondary | ICD-10-CM | POA: Diagnosis not present

## 2022-11-20 DIAGNOSIS — L814 Other melanin hyperpigmentation: Secondary | ICD-10-CM | POA: Diagnosis not present

## 2022-11-20 DIAGNOSIS — I872 Venous insufficiency (chronic) (peripheral): Secondary | ICD-10-CM | POA: Diagnosis not present

## 2022-11-20 DIAGNOSIS — L988 Other specified disorders of the skin and subcutaneous tissue: Secondary | ICD-10-CM | POA: Diagnosis not present

## 2022-11-20 DIAGNOSIS — Z08 Encounter for follow-up examination after completed treatment for malignant neoplasm: Secondary | ICD-10-CM | POA: Diagnosis not present

## 2022-11-20 DIAGNOSIS — L821 Other seborrheic keratosis: Secondary | ICD-10-CM | POA: Diagnosis not present

## 2022-11-20 DIAGNOSIS — Z86006 Personal history of melanoma in-situ: Secondary | ICD-10-CM | POA: Diagnosis not present

## 2022-11-24 ENCOUNTER — Other Ambulatory Visit: Payer: Self-pay | Admitting: *Deleted

## 2022-11-24 DIAGNOSIS — I480 Paroxysmal atrial fibrillation: Secondary | ICD-10-CM

## 2022-11-24 MED ORDER — WARFARIN SODIUM 2.5 MG PO TABS: ORAL_TABLET | ORAL | 1 refills | Status: DC

## 2022-11-24 NOTE — Telephone Encounter (Signed)
Warfarin 2.5mg  refill Afib Last INR 11/05/22 Last OV 11/03/22 with Dr. Shari Prows & will f/u with Dr. Bjorn Pippin per note.

## 2022-11-28 ENCOUNTER — Ambulatory Visit: Payer: Medicare Other

## 2022-11-28 DIAGNOSIS — Z5181 Encounter for therapeutic drug level monitoring: Secondary | ICD-10-CM | POA: Insufficient documentation

## 2022-11-28 DIAGNOSIS — I480 Paroxysmal atrial fibrillation: Secondary | ICD-10-CM | POA: Diagnosis not present

## 2022-11-28 LAB — POCT INR: INR: 3.3 — AB (ref 2.0–3.0)

## 2022-11-28 NOTE — Patient Instructions (Signed)
Continue taking 1.5 tablets daily. EAT GREENS TONIGHT. Recheck INR in 4 weeks. Stay consistent with greens (2 per week).  [facility] [phone]

## 2022-12-26 ENCOUNTER — Telehealth: Payer: Self-pay

## 2022-12-26 ENCOUNTER — Ambulatory Visit: Payer: Medicare Other | Attending: Cardiovascular Disease | Admitting: *Deleted

## 2022-12-26 DIAGNOSIS — I480 Paroxysmal atrial fibrillation: Secondary | ICD-10-CM | POA: Insufficient documentation

## 2022-12-26 DIAGNOSIS — Z5181 Encounter for therapeutic drug level monitoring: Secondary | ICD-10-CM | POA: Insufficient documentation

## 2022-12-26 LAB — POCT INR: POC INR: 2.9

## 2022-12-26 NOTE — Telephone Encounter (Signed)
Pt called in stating that she was having chest pounding and felt irregular yesterday 9/19. Pt feels better today but has sent in a transmission and would like a call back to make sure everything is ok

## 2022-12-26 NOTE — Patient Instructions (Signed)
Description   Continue taking 1.5 tablets daily. Recheck INR in 4 weeks. Stay consistent with greens (2 per week).  [facility] [phone]

## 2022-12-26 NOTE — Telephone Encounter (Signed)
Returned call to Pt.  Per Pt she felt her "heart pounding in her chest" last night and she wanted to make sure it wasn't just "in her head".  Advised that she had an extended episode of heart racing.  Unable to pull up EGM's at this time, but based on Max BPM A/V appears to be atrial flutter with RVR.  Pt states she has been taking Toprol as prescribed and in addition took her metoprolol tartrate around 6:00 pm.  Appears episode lasted about 4.5 hours.  This episode is long for her.  Advised Pt that would have her follow up with EP APP to see if there would be any other strategies if she had another long episode.  Pt in agreement.  All questions answered.

## 2022-12-30 DIAGNOSIS — M2550 Pain in unspecified joint: Secondary | ICD-10-CM | POA: Diagnosis not present

## 2022-12-30 DIAGNOSIS — E785 Hyperlipidemia, unspecified: Secondary | ICD-10-CM | POA: Diagnosis not present

## 2022-12-30 DIAGNOSIS — E039 Hypothyroidism, unspecified: Secondary | ICD-10-CM | POA: Diagnosis not present

## 2022-12-30 DIAGNOSIS — I10 Essential (primary) hypertension: Secondary | ICD-10-CM | POA: Diagnosis not present

## 2022-12-30 DIAGNOSIS — I4891 Unspecified atrial fibrillation: Secondary | ICD-10-CM | POA: Diagnosis not present

## 2022-12-30 DIAGNOSIS — Z23 Encounter for immunization: Secondary | ICD-10-CM | POA: Diagnosis not present

## 2022-12-30 DIAGNOSIS — Z7901 Long term (current) use of anticoagulants: Secondary | ICD-10-CM | POA: Diagnosis not present

## 2022-12-30 DIAGNOSIS — I679 Cerebrovascular disease, unspecified: Secondary | ICD-10-CM | POA: Diagnosis not present

## 2022-12-30 DIAGNOSIS — Z0000 Encounter for general adult medical examination without abnormal findings: Secondary | ICD-10-CM | POA: Diagnosis not present

## 2022-12-30 DIAGNOSIS — M8589 Other specified disorders of bone density and structure, multiple sites: Secondary | ICD-10-CM | POA: Diagnosis not present

## 2022-12-30 DIAGNOSIS — E538 Deficiency of other specified B group vitamins: Secondary | ICD-10-CM | POA: Diagnosis not present

## 2022-12-30 DIAGNOSIS — E559 Vitamin D deficiency, unspecified: Secondary | ICD-10-CM | POA: Diagnosis not present

## 2022-12-30 LAB — COMPREHENSIVE METABOLIC PANEL: EGFR: 58

## 2023-01-13 DIAGNOSIS — Z23 Encounter for immunization: Secondary | ICD-10-CM | POA: Diagnosis not present

## 2023-01-19 ENCOUNTER — Ambulatory Visit (INDEPENDENT_AMBULATORY_CARE_PROVIDER_SITE_OTHER): Payer: Medicare Other

## 2023-01-19 DIAGNOSIS — I495 Sick sinus syndrome: Secondary | ICD-10-CM | POA: Diagnosis not present

## 2023-01-20 LAB — CUP PACEART REMOTE DEVICE CHECK
Battery Remaining Longevity: 89 mo
Battery Voltage: 2.98 V
Brady Statistic AP VP Percent: 0.15 %
Brady Statistic AP VS Percent: 97.32 %
Brady Statistic AS VP Percent: 0.02 %
Brady Statistic AS VS Percent: 2.51 %
Brady Statistic RA Percent Paced: 97.41 %
Brady Statistic RV Percent Paced: 0.17 %
Date Time Interrogation Session: 20241013234002
Implantable Lead Connection Status: 753985
Implantable Lead Connection Status: 753985
Implantable Lead Implant Date: 20180914
Implantable Lead Implant Date: 20180914
Implantable Lead Location: 753859
Implantable Lead Location: 753860
Implantable Lead Model: 5076
Implantable Lead Model: 5076
Implantable Pulse Generator Implant Date: 20180914
Lead Channel Impedance Value: 304 Ohm
Lead Channel Impedance Value: 380 Ohm
Lead Channel Impedance Value: 399 Ohm
Lead Channel Impedance Value: 494 Ohm
Lead Channel Pacing Threshold Amplitude: 0.625 V
Lead Channel Pacing Threshold Amplitude: 1 V
Lead Channel Pacing Threshold Pulse Width: 0.4 ms
Lead Channel Pacing Threshold Pulse Width: 0.4 ms
Lead Channel Sensing Intrinsic Amplitude: 14.125 mV
Lead Channel Sensing Intrinsic Amplitude: 14.125 mV
Lead Channel Sensing Intrinsic Amplitude: 2.625 mV
Lead Channel Sensing Intrinsic Amplitude: 2.625 mV
Lead Channel Setting Pacing Amplitude: 1.5 V
Lead Channel Setting Pacing Amplitude: 2.5 V
Lead Channel Setting Pacing Pulse Width: 0.4 ms
Lead Channel Setting Sensing Sensitivity: 0.9 mV
Zone Setting Status: 755011
Zone Setting Status: 755011

## 2023-01-23 ENCOUNTER — Ambulatory Visit: Payer: Medicare Other | Attending: Internal Medicine

## 2023-01-23 DIAGNOSIS — I480 Paroxysmal atrial fibrillation: Secondary | ICD-10-CM | POA: Insufficient documentation

## 2023-01-23 DIAGNOSIS — Z5181 Encounter for therapeutic drug level monitoring: Secondary | ICD-10-CM | POA: Diagnosis not present

## 2023-01-23 LAB — POCT INR: INR: 3.1 — AB (ref 2.0–3.0)

## 2023-01-23 NOTE — Patient Instructions (Signed)
Continue taking 1.5 tablets daily. Eat greens tonight. Recheck INR in 5 weeks. Stay consistent with greens (2 per week).  [facility] [phone]

## 2023-02-03 NOTE — Progress Notes (Signed)
Remote pacemaker transmission.   

## 2023-02-08 NOTE — Progress Notes (Unsigned)
Cardiology Office Note Date:  02/08/2023  Patient ID:  [name], [name] 1946/03/09, MRN [id number] PCP:  Henrine Screws, MD  Cardiologist:  Dr. Katrinka Blazing (retired) > Dr. Shari Prows (inactive) Electrophysiologist: Dr. Johney Frame (inactive) >> Dr. Elberta Fortis    Chief Complaint:  pt requested visit, discuss AF management options  History of Present Illness: [name] is a 77 y.o. female with history of anxiety, COPD, deaf (R ear), stroke, HTN, HLD, hypothyroidism, Afib, tachy-brady w/PPM.  I saw her 05/31/21 She is doing much better emotionally, on Wellbutrin and Zoloft  No CP, palpitations or cardiac awareness No SOB No near syncope or syncope. No bleeding or signs of bleeding Low arrhythmia burden No changes were made  She saw Dr. Elberta Fortis 07/01/22, reported recent increase in palpitations, not like her AFib, typically when active Low AF burden, Toprol increased  Saw Dr. Shari Prows last, 729/24, reported a 4 hour episode of Afib, palpitation burden not likely FAib given remote noting low AF burden, given PRN lopressor   Sept the pt reached out to office with increased palpitations, remote and d/w device clinic RN, suspected AFlutter w/RVR, 4.5 hours in duration, planned to come in for discussion  TODAY She feels like in the last few months she is having more and more fast HRs. They do not feel exactly like her Afib felt, not as irregular, but quite fast. Uncomfortable feeling, no CP, but make her feel tired, SOB No clear triggers No dizzy spells near syncope or syncope.  She feels like she has days that it is on/off over and over, though short lasting Others last several minutes When the RN told her was a 4 hour episode, she was surprised to hear that  She has some orthopedic pains that limit her to some degree but of late she has slowed down because she is worried she will set her heart racing off  Warfarin levels are steady, no bleeding or signs of  bleeding   Device information MDT dual chamber PPM, implanted 2018  AFib Hx diagnosis goes as far back as her Epic chart (2011)  AAD Hx Flecainide started 2018 BID eventually pill in pocket at an EP visit Jan 2021 pt states then taken off her list because she had not used it in years   Past Medical History:  Diagnosis Date   Acoustic neuroma (HCC)    Hx of right ear acoustic neuroma, removed in 1999. No hearing in right ear.   Anxiety    Hx of, responds to Wellbutrin   Basal cell carcinoma    s/p MOHS surgery in 03/2011   Breast cancer (HCC) 2021   left lumpectomy   Cataract    Chronic anticoagulation 11/24/2016   Chronic cholecystitis with calculus 05/13/2013   Closed fracture of left lateral malleolus 08/10/2017   COPD (chronic obstructive pulmonary disease) (HCC)    Deafness in right ear    Embolic stroke (HCC) 11/24/2016   Family history of adverse reaction to anesthesia    Family hx of PONV   Fracture    left lateral malleolus fracture   GERD (gastroesophageal reflux disease)    Headache(784.0)    MIGRAINES   Hearing loss    Only in right ear, from an acoustic neuroma. S/P removal in 1999.    History of hiatal hernia    History of neck pain    Responds to Flexeril   History of shingles    Hypercholesteremia    Hypertension    Hypothyroidism  Melanoma (HCC) 2018   right arm-surgery only   PAF (paroxysmal atrial fibrillation) (HCC)    2 brief episodes of Afib recorded on monitor 02/2007   Paroxysmal atrial fibrillation (HCC)    PONV (postoperative nausea and vomiting)    Presence of permanent cardiac pacemaker 2018   for A-fib   Sick sinus syndrome (HCC) 12/19/2016   Sinus bradycardia    Tachycardia-bradycardia syndrome (HCC) 11/24/2016   TIA (transient ischemic attack) 2011   On coumadin. Asymptomatic, without recurrence.    Past Surgical History:  Procedure Laterality Date   ABDOMINAL HYSTERECTOMY     APPENDECTOMY     BREAST BIOPSY     X 3    BREAST CYST ASPIRATION Left 50 yrs ago per pt   BREAST EXCISIONAL BIOPSY Left 50 yrs ago   BREAST LUMPECTOMY WITH RADIOACTIVE SEED LOCALIZATION Left 06/21/2019   Procedure: LEFT BREAST LUMPECTOMY WITH RADIOACTIVE SEED LOCALIZATION;  Surgeon: Manus Rudd, MD;  Location: [facility];  Service: General;  Laterality: Left;   CHOLECYSTECTOMY N/A 05/26/2013   Procedure: LAPAROSCOPIC CHOLECYSTECTOMY WITH INTRAOPERATIVE CHOLANGIOGRAM;  Surgeon: Wilmon Arms. Corliss Skains, MD;  Location: [facility] OR;  Service: General;  Laterality: N/A;   CRANIECTOMY FOR EXCISION OF ACOUSTIC NEUROMA Right 1999   INTRAOCULAR LENS INSERTION     Dr. Vonna Kotyk   MOHS SURGERY  03/2011   Basal cell skin cancer   ORIF ANKLE FRACTURE Left 08/10/2017   Procedure: OPEN REDUCTION INTERNAL FIXATION (ORIF) LEFT ANKLE FRACTURE;  Surgeon: Samson Frederic, MD;  Location: [facility] OR;  Service: Orthopedics;  Laterality: Left;   PACEMAKER IMPLANT N/A 12/19/2016   Medtronic Azure XT MRI conditional dual-chamber pacemaker for symptomatic sinus bradycardia by Dr Johney Frame   TONSILLECTOMY      Current Outpatient Medications  Medication Sig Dispense Refill   Cholecalciferol (VITAMIN D3 PO) Take by mouth.     clonazePAM (KLONOPIN) 0.5 MG tablet Take 0.5 mg by mouth daily.     Docusate Sodium (DSS) 100 MG CAPS Take 1 tablet by mouth as needed.     enoxaparin (LOVENOX) 60 MG/0.6ML injection Inject 0.6 mLs (60 mg total) into the skin every 12 (twelve) hours. 12 mL 1   Evolocumab (REPATHA SURECLICK) 140 MG/ML SOAJ Inject 140 mg into the skin every 14 (fourteen) days. 2 mL 11   FLUoxetine (PROZAC) 10 MG capsule Take 10 mg by mouth daily.     fluticasone (CUTIVATE) 0.05 % cream as needed.     fluticasone (FLONASE) 50 MCG/ACT nasal spray Place 1 spray into both nostrils daily.     JUBLIA 10 % SOLN apply to affected nails qd     levothyroxine (SYNTHROID) 100 MCG tablet Take 100 mcg by mouth every morning.     loratadine (CLARITIN) 10 MG tablet Take 10 mg by  mouth daily as needed for allergies.     metoprolol succinate (TOPROL-XL) 50 MG 24 hr tablet Take 1 tablet (50 mg total) by mouth daily. Take with or immediately following a meal. 90 tablet 3   metoprolol tartrate (LOPRESSOR) 25 MG tablet Take 0.5 tablets (12.5 mg total) by mouth 2 (two) times daily as needed (palpitations). 30 tablet 5   pantoprazole (PROTONIX) 40 MG tablet Take 40 mg by mouth 2 (two) times daily.     sucralfate (CARAFATE) 1 g tablet TAKE 1 TABLET BY MOUTH TWICE A DAY ON EMPTY STOMACH     vitamin B-12 (CYANOCOBALAMIN) 1000 MCG tablet 2 tablets     warfarin (COUMADIN) 2.5 MG tablet TAKE  1 AND 1/2 (ONE AND ONE HALF) TABLETS BY MOUTH DAILY AS DIRECTED BY COUMADIN CLINIC. 150 tablet 1   No current facility-administered medications for this visit.    Allergies:   Atorvastatin, Statins, Codeine, Ezetimibe, Latex, Penicillins, and Rosuvastatin   Social History:  The patient  reports that she has never smoked. She has never used smokeless tobacco. She reports current alcohol use. She reports that she does not use drugs.   Family History:  The patient's family history includes Alcoholism in her father; Brain cancer in her maternal aunt; Breast cancer in her maternal aunt; CVA in an other family member; Cancer in her maternal grandmother; Diabetes in her mother; Healthy in her sister; Heart disease in an other family member; High Cholesterol in her mother; Hyperlipidemia in an other family member; Hypertension in her mother and another family member; Liver cancer in her father; Memory loss in her mother.  ROS:  Please see the history of present illness.    All other systems are reviewed and otherwise negative.   PHYSICAL EXAM:  VS:  There were no vitals taken for this visit. BMI: There is no height or weight on file to calculate BMI. Well nourished, well developed, in no acute distress HEENT: normocephalic, atraumatic Neck: no JVD, carotid bruits or masses Cardiac: RRR; no  significant murmurs, no rubs, or gallops Lungs:  CTA b/l, no wheezing, rhonchi or rales Abd: soft, nontender MS: no deformity or Atrophy Ext: no edema Skin: warm and dry, no rash Neuro:  No gross deficits appreciated Psych: euthymic mood, full affect  PPM site is stable, no tethering or discomfort   EKG:  done today and reviewed by myself Narrow tachycardia, appears long RP either ST/AT 115bpm #2 is AP/V sensed  Device interrogation done today and reviewed by myself:  Battery and lead measurements are good  1:1 tachycardia, 130's-150's  Afib and flutter w/FVR/RVR AP 95.4% VP 0.2%  Myoview 11/13/21  Review of the above records today demonstrates:    The study is normal. The study is low risk.   No ST deviation was noted.   LV perfusion is normal. There is no evidence of ischemia. There is no evidence of infarction.   Left ventricular function is normal. Nuclear stress EF: 75 %. The left ventricular ejection fraction is hyperdynamic (>65%). End diastolic cavity size is normal. End systolic cavity size is normal.   Prior study available for comparison from 12/11/2016.    06/25/20; TTE 1. Left ventricular ejection fraction, by estimation, is 55 to 60%. The  left ventricle has normal function. The left ventricle has no regional  wall motion abnormalities. There is mild asymmetric left ventricular  hypertrophy of the basal and septal  segments. Left ventricular diastolic parameters are indeterminate. The  average left ventricular global longitudinal strain is -17.4 %.   2. Pacing wires in RA/RV . Right ventricular systolic function is normal.  The right ventricular size is normal. There is normal pulmonary artery  systolic pressure.   3. Left atrial size was moderately dilated.   4. The mitral valve is normal in structure. Moderate mitral valve  regurgitation. No evidence of mitral stenosis.   5. The aortic valve was not well visualized. There is moderate  calcification of the  aortic valve. There is moderate thickening of the  aortic valve. Aortic valve regurgitation is mild. Mild to moderate aortic  valve sclerosis/calcification is present,  without any evidence of aortic stenosis.   6. The inferior vena cava is normal  in size with greater than 50%  respiratory variability, suggesting right atrial pressure of 3 mmHg.   12/11/2016: stress myoview Clinically and electrically negative for ischemia Normal perfusion. No ischemia or scar This is a low risk study. LVEF is 62%   12/02/2016: TTE Study Conclusions  - Left ventricle: The cavity size was normal. Wall thickness was    normal. Systolic function was normal. The estimated ejection    fraction was in the range of 55% to 60%. Wall motion was normal;    there were no regional wall motion abnormalities. Features are    consistent with a pseudonormal left ventricular filling pattern,    with concomitant abnormal relaxation and increased filling    pressure (grade 2 diastolic dysfunction).  - Aortic valve: There was no stenosis.  - Mitral valve: Mildly calcified annulus. There was mild    regurgitation.  - Left atrium: The atrium was mildly to moderately dilated.  - Right ventricle: The cavity size was normal. Systolic function    was normal.  - Right atrium: The atrium was mildly dilated.  - Tricuspid valve: Peak RV-RA gradient (S): 27 mm Hg.  - Pulmonary arteries: PA peak pressure: 30 mm Hg (S).  - Inferior vena cava: The vessel was normal in size. The    respirophasic diameter changes were in the normal range (>= 50%),    consistent with normal central venous pressure.   Impressions:  - Normal LV size with EF 55-60%. Moderate diastolic dysfunction.    Normal RV size and systolic function. Mild mitral regurgitation.     Recent Labs: No results found for requested labs within last 365 days.  No results found for requested labs within last 365 days.   CrCl cannot be calculated (Patient's most recent  lab result is older than the maximum 21 days allowed.).   Wt Readings from Last 3 Encounters:  11/03/22 143 lb (64.9 kg)  10/30/22 140 lb (63.5 kg)  07/01/22 149 lb (67.6 kg)     Other studies reviewed: Additional studies/records reviewed today include: summarized above  ASSESSMENT AND PLAN:  1. PPM     Intact function     No programming changes made  2. paroxysmal Afib, flutter     AT     CHA2DS2Vasc is 6,  On warfarin     0.2%      Several episodes mostly fairly brief, some lasting hours She had a negative stress test last year No symptoms to suggest change Restart flecainide 50mg  BID EKG in 7-10 days Ill see her back in 3 weks or so, will plan ETT if no dose change needed at that visit       Disposition: as above    Current medicines are reviewed at length with the patient today.  The patient did not have any concerns regarding medicines.  Norma Fredrickson, PA-C 02/08/2023 2:37 PM     [facility] [address] [place] [address] [phone] (office)  [phone] (fax)

## 2023-02-11 ENCOUNTER — Ambulatory Visit: Payer: Medicare Other | Attending: Physician Assistant | Admitting: Physician Assistant

## 2023-02-11 ENCOUNTER — Encounter: Payer: Self-pay | Admitting: Physician Assistant

## 2023-02-11 VITALS — BP 104/82 | HR 115 | Ht 65.0 in | Wt 140.0 lb

## 2023-02-11 DIAGNOSIS — I480 Paroxysmal atrial fibrillation: Secondary | ICD-10-CM | POA: Diagnosis not present

## 2023-02-11 DIAGNOSIS — Z950 Presence of cardiac pacemaker: Secondary | ICD-10-CM | POA: Insufficient documentation

## 2023-02-11 DIAGNOSIS — I4719 Other supraventricular tachycardia: Secondary | ICD-10-CM | POA: Diagnosis not present

## 2023-02-11 LAB — CUP PACEART INCLINIC DEVICE CHECK
Battery Remaining Longevity: 89 mo
Battery Voltage: 2.98 V
Brady Statistic AP VP Percent: 0.18 %
Brady Statistic AP VS Percent: 95.54 %
Brady Statistic AS VP Percent: 0.02 %
Brady Statistic AS VS Percent: 4.27 %
Brady Statistic RA Percent Paced: 95.37 %
Brady Statistic RV Percent Paced: 0.2 %
Date Time Interrogation Session: 20241106175300
Implantable Lead Connection Status: 753985
Implantable Lead Connection Status: 753985
Implantable Lead Implant Date: 20180914
Implantable Lead Implant Date: 20180914
Implantable Lead Location: 753859
Implantable Lead Location: 753860
Implantable Lead Model: 5076
Implantable Lead Model: 5076
Implantable Pulse Generator Implant Date: 20180914
Lead Channel Impedance Value: 304 Ohm
Lead Channel Impedance Value: 380 Ohm
Lead Channel Impedance Value: 456 Ohm
Lead Channel Impedance Value: 551 Ohm
Lead Channel Pacing Threshold Amplitude: 0.625 V
Lead Channel Pacing Threshold Amplitude: 0.875 V
Lead Channel Pacing Threshold Pulse Width: 0.4 ms
Lead Channel Pacing Threshold Pulse Width: 0.4 ms
Lead Channel Sensing Intrinsic Amplitude: 14.25 mV
Lead Channel Sensing Intrinsic Amplitude: 16.625 mV
Lead Channel Sensing Intrinsic Amplitude: 2.75 mV
Lead Channel Sensing Intrinsic Amplitude: 2.75 mV
Lead Channel Setting Pacing Amplitude: 1.5 V
Lead Channel Setting Pacing Amplitude: 2.5 V
Lead Channel Setting Pacing Pulse Width: 0.4 ms
Lead Channel Setting Sensing Sensitivity: 0.9 mV
Zone Setting Status: 755011
Zone Setting Status: 755011

## 2023-02-11 MED ORDER — FLECAINIDE ACETATE 50 MG PO TABS: 50.0000 mg | ORAL_TABLET | Freq: Two times a day (BID) | ORAL | 3 refills | Status: DC

## 2023-02-11 NOTE — Patient Instructions (Addendum)
Medication Instructions:  START Flecainide 50 mg twice daily  *If you need a refill on your cardiac medications before your next appointment, please call your pharmacy*   Testing/Procedures: Repeat EKG scheduled on Thursday, 02/19/23 at 2 pm   Follow-Up: At [facility], you and your health needs are our priority.  As part of our continuing mission to provide you with exceptional heart care, we have created designated Provider Care Teams.  These Care Teams include your primary Cardiologist (physician) and Advanced Practice Providers (APPs -  Physician Assistants and Nurse Practitioners) who all work together to provide you with the care you need, when you need it.  We recommend signing up for the patient portal called "MyChart".  Sign up information is provided on this After Visit Summary.  MyChart is used to connect with patients for Virtual Visits (Telemedicine).  Patients are able to view lab/test results, encounter notes, upcoming appointments, etc.  Non-urgent messages can be sent to your provider as well.   To learn more about what you can do with MyChart, go to [email or web address].    Your next appointment:   On Tuesday, 03/10/23 at 10:55 am  Provider:   Francis Dowse, PA-C

## 2023-02-19 ENCOUNTER — Ambulatory Visit: Payer: Medicare Other | Attending: Cardiovascular Disease

## 2023-02-19 VITALS — Ht 65.0 in | Wt 142.3 lb

## 2023-02-19 DIAGNOSIS — I480 Paroxysmal atrial fibrillation: Secondary | ICD-10-CM | POA: Insufficient documentation

## 2023-02-19 DIAGNOSIS — I4719 Other supraventricular tachycardia: Secondary | ICD-10-CM | POA: Diagnosis not present

## 2023-02-19 NOTE — Progress Notes (Signed)
   Nurse Visit   Date of Encounter: 02/19/2023 ID: Mckynzi Lionetti, DOB 02-Jun-1945, MRN [id number]  PCP:  Henrine Screws, MD   [facility] Providers Cardiologist:  None      Visit Details   VS:  Ht 5\' 5"  (1.651 m)   Wt 142 lb 4.8 oz (64.5 kg)   BMI 23.68 kg/m  , BMI Body mass index is 23.68 kg/m.  Wt Readings from Last 3 Encounters:  02/19/23 142 lb 4.8 oz (64.5 kg)  02/11/23 140 lb (63.5 kg)  11/03/22 143 lb (64.9 kg)     Reason for visit: EKG Performed today: Vitals, EKG, Provider consulted:Renee Stafford PA, , and Education Changes (medications, testing, etc.) : None Length of Visit: 10 minutes    Medications Adjustments/Labs and Tests Ordered: Orders Placed This Encounter  Procedures   EKG 12-Lead   EKG 12-Lead   EKG 12-Lead   Patient is aware to continue current medications. Verbalized understanding   Signed, Judene Companion, LPN  32/35/5732 2:37 PM

## 2023-02-27 ENCOUNTER — Ambulatory Visit: Payer: Medicare Other | Attending: Cardiovascular Disease

## 2023-02-27 DIAGNOSIS — I480 Paroxysmal atrial fibrillation: Secondary | ICD-10-CM | POA: Diagnosis not present

## 2023-02-27 DIAGNOSIS — Z5181 Encounter for therapeutic drug level monitoring: Secondary | ICD-10-CM | POA: Diagnosis not present

## 2023-02-27 LAB — POCT INR: INR: 4 — AB (ref 2.0–3.0)

## 2023-02-27 IMAGING — CT CT FOOT*L* W/O CM
3 series · 12 of 35 positions shown, 14 images · non-contrast
Comparison: Radiographs 08/27/2021

CLINICAL DATA: Proximal metatarsal fractures, fall.

EXAM:
CT OF THE LEFT FOOT WITHOUT CONTRAST
TECHNIQUE: Multidetector CT imaging of the left foot was performed according to
the standard protocol. Multiplanar CT image reconstructions were
also generated.
RADIATION DOSE REDUCTION: This exam was performed according to the
departmental dose-optimization program which includes automated
exposure control, adjustment of the mA and/or kV according to
patient size and/or use of iterative reconstruction technique.

[Series 4: axial st · axial · 0.50mm/px · z∈[-1442,-1284]mm · 4 of 113 slices shown, 5 images]
[im 18/113  soft-tissue]
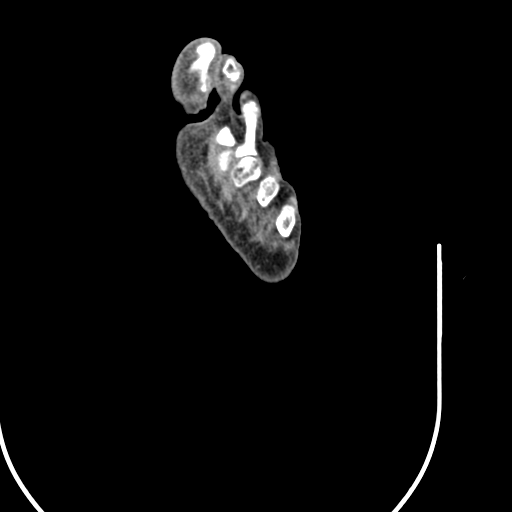
[im 18/113  bone]
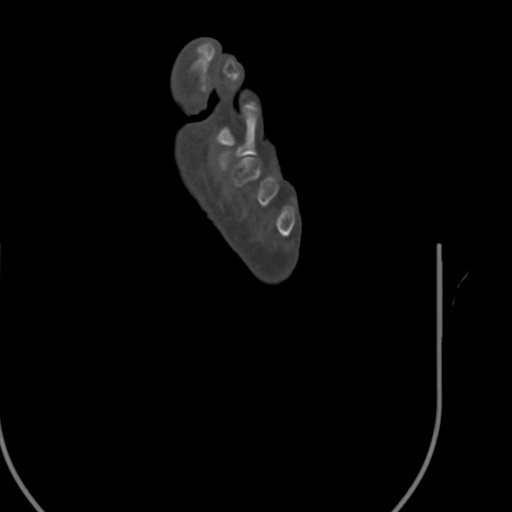
[im 44/113  bone]
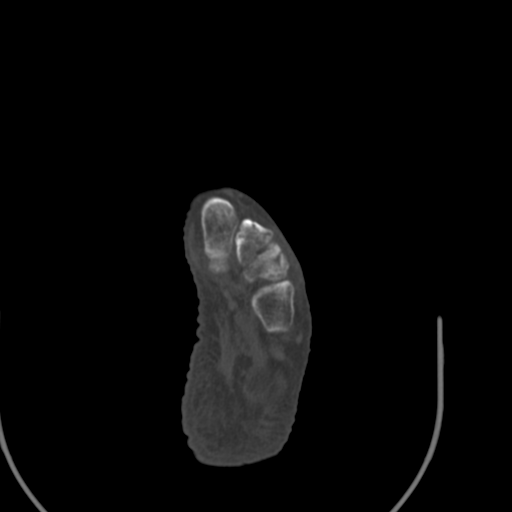
[im 69/113  bone]
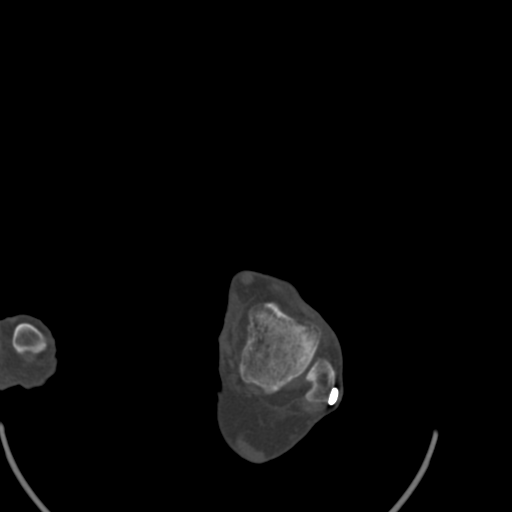
[im 95/113  bone]
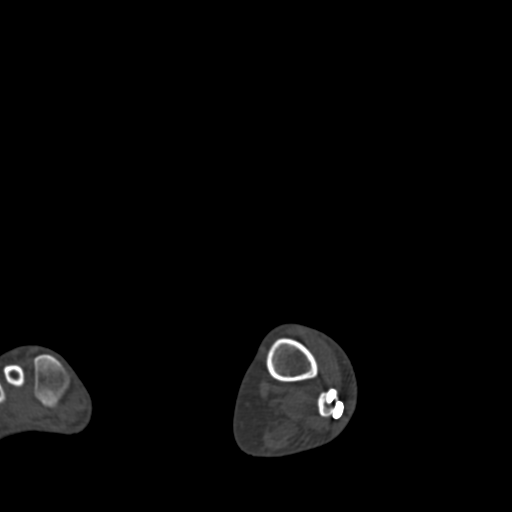

[Series 9: coronal st · coronal · 0.39mm/px · 3 of 161 slices shown]
[im 64/161  bone]
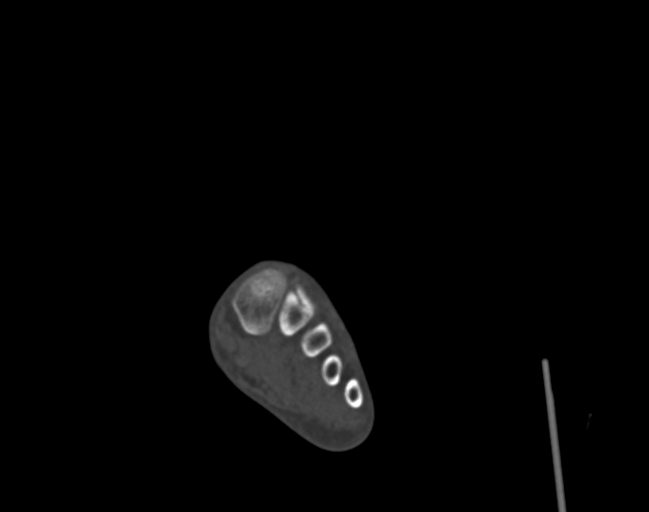
[im 75/161  bone]
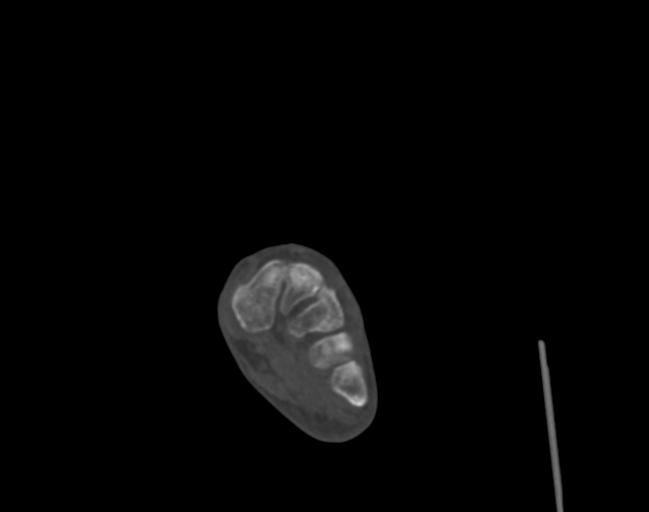
[im 86/161  bone]
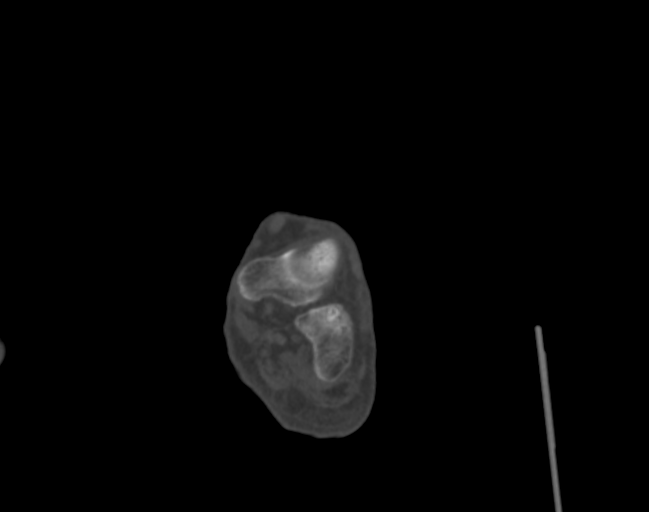

[Series 10: sagittal st · sagittal · 0.39mm/px · 5 of 127 slices shown, 6 images]
[im 43/127  bone]
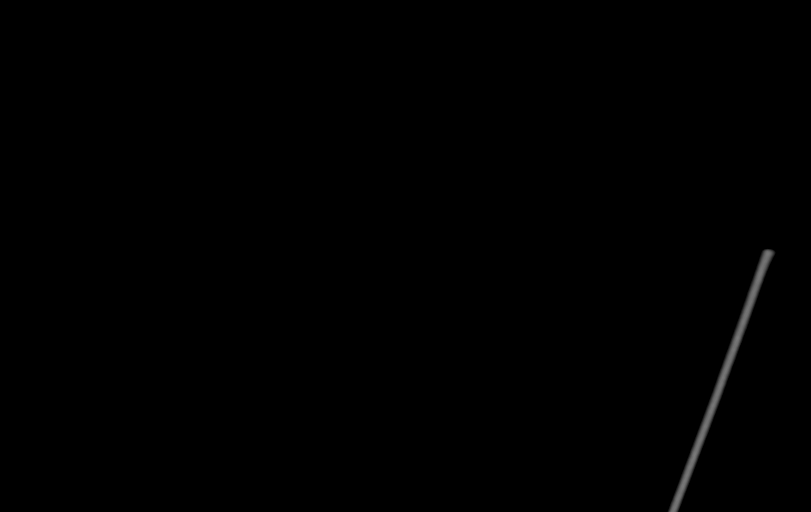
[im 53/127  bone]
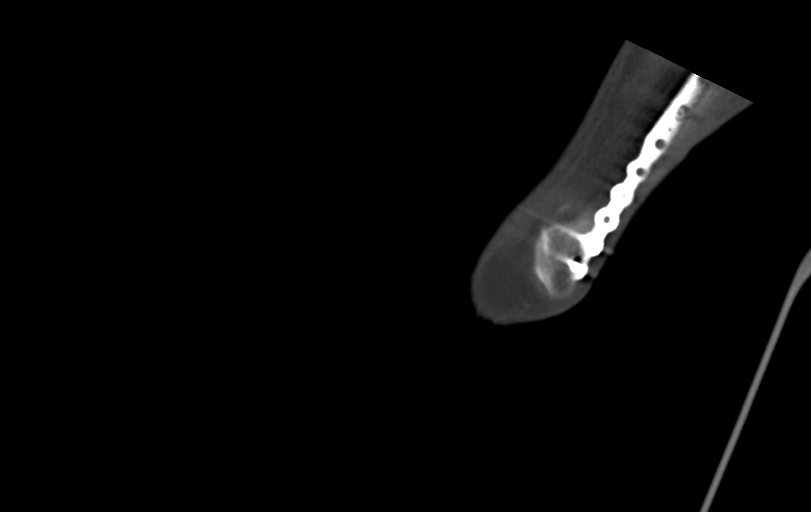
[im 64/127  soft-tissue]
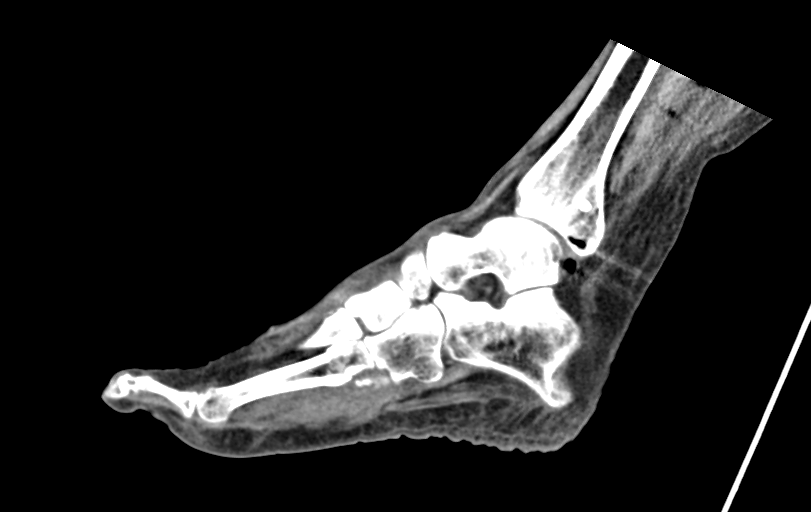
[im 64/127  bone]
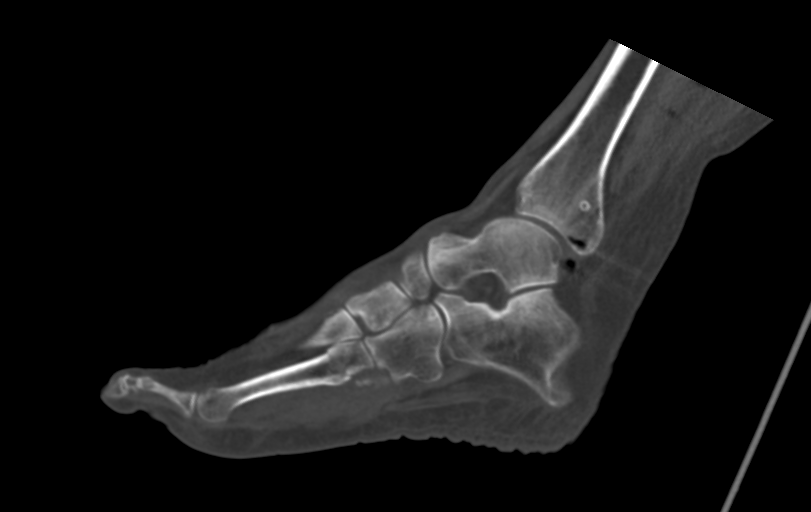
[im 74/127  bone]
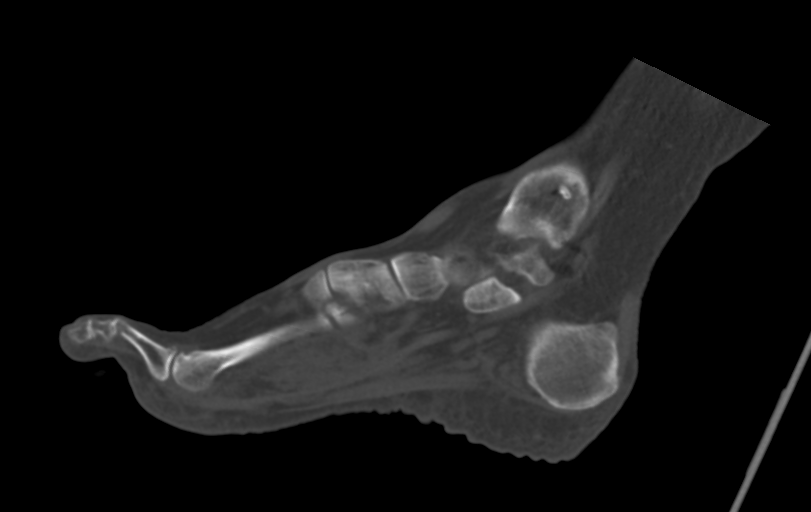
[im 85/127  bone]
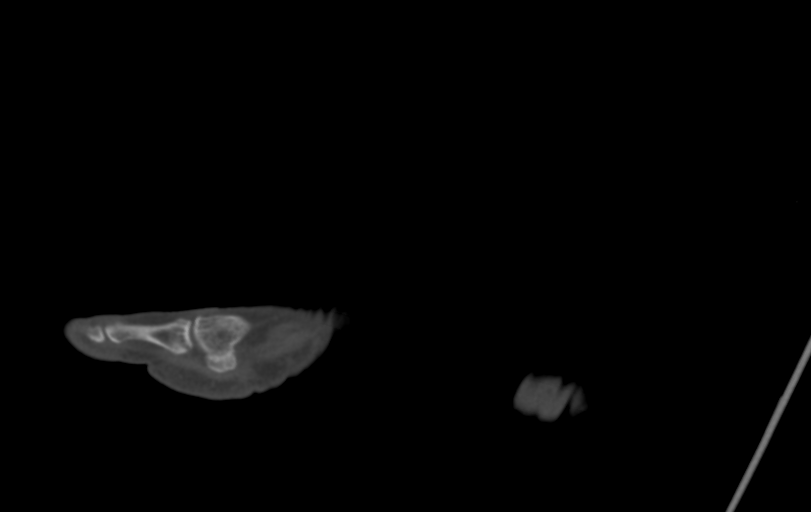

[12 of 35 positions shown; findings below may reference images not displayed]

FINDINGS: Bones/Joint/Cartilage

Old plate and screw fixator of the distal fibula, track along the
distal tibia transversely extending to a small metal button type
device. No acute fracture involving the hindfoot.

Subtle fracture of the distal lateral margin of the lateral
cuneiform on image 67 series 8, also shown on image 73 series 7.

Mildly comminuted but nondisplaced fracture of the base of the
second metatarsal with a transverse component through the metaphysis
but also with extension of fracture planes to the articular surface,
suspected lesser fragment involving the attachment site of the
Lisfranc ligament.

Nondisplaced fracture involving the metaphysis and proximal
articular surface of the third metatarsal as on image 59 series 8.

Transverse fracture of the proximal fourth metatarsal metaphysis on
image 65 series 8.

Small avulsion fracture from the plantar fifth metatarsal base,
image 73 series 7.

Ligaments

Suboptimally assessed by CT.

Muscles and Tendons

Mild atrophy of the musculature of the foot.

Soft tissues

Unremarkable
IMPRESSION: 1. Lisfranc joint injury fractures of the bases of the second,
third, fourth, and fifth metatarsals along with a fracture the
lateral margin of the lateral cuneiform. The comminuted nondisplaced
fractures at the base of the second metatarsal may predispose to
Lisfranc joint instability although there is no current substantial
malalignment between the midfoot and metatarsal bases.

## 2023-02-27 NOTE — Patient Instructions (Signed)
Description   Skip today's dosage of Warfarin, then take 1 tablet tomorrow, then resume same dosage of Warfarin 1.5 tablets daily.  Recheck INR in 2 weeks. Stay consistent with greens (2 per week).  Coumadin Clinic [phone]

## 2023-03-04 NOTE — Progress Notes (Unsigned)
Cardiology Office Note Date:  03/04/2023  Patient ID:  [name], [name] 14-Oct-1945, MRN [id number] PCP:  Henrine Screws, MD  Cardiologist:  Dr. Katrinka Blazing (retired) > Dr. Shari Prows (inactive) Electrophysiologist: Dr. Johney Frame (inactive) >> Dr. Elberta Fortis      Chief Complaint:  *** f/u on flecainide re-start   History of Present Illness: [name] is a 77 y.o. female with history of anxiety, COPD, deaf (R ear), stroke, HTN, HLD, hypothyroidism, Afib, tachy-brady w/PPM.  I saw her 05/31/21 She is doing much better emotionally, on Wellbutrin and Zoloft  No CP, palpitations or cardiac awareness No SOB No near syncope or syncope. No bleeding or signs of bleeding Low arrhythmia burden No changes were made  She saw Dr. Elberta Fortis 07/01/22, reported recent increase in palpitations, not like her AFib, typically when active Low AF burden, Toprol increased  Saw Dr. Shari Prows last, 729/24, reported a 4 hour episode of Afib, palpitation burden not likely FAib given remote noting low AF burden, given PRN lopressor   Sept the pt reached out to office with increased palpitations, remote and d/w device clinic RN, suspected AFlutter w/RVR, 4.5 hours in duration, planned to come in for discussion  I saw her 02/11/23 She feels like in the last few months she is having more and more fast HRs. They do not feel exactly like her Afib felt, not as irregular, but quite fast. Uncomfortable feeling, no CP, but make her feel tired, SOB No clear triggers No dizzy spells near syncope or syncope. She feels like she has days that it is on/off over and over, though short lasting Others last several minutes When the RN told her was a 4 hour episode, she was surprised to hear that She has some orthopedic pains that limit her to some degree but of late she has slowed down because she is worried she will set her heart racing off Warfarin levels are steady, no bleeding or signs of bleeding She  arrives in a 1:1 tachycardia 115bpm via device rates 130's-150's with AFib/flutter episodes as well Resumed flecainide 50mg  BID with her metoprolol  *** flecainide EKG *** burden *** ETT  Device information MDT dual chamber PPM, implanted 2018  AFib Hx diagnosis goes as far back as her Epic chart (2011)  AAD Hx Flecainide started 2018 BID eventually pill in pocket at an EP visit Jan 2021 pt states then taken off her list because she had not used it in years Flecainide restarted Nov 2024 with recurrent AT/AF   Past Medical History:  Diagnosis Date   Acoustic neuroma (HCC)    Hx of right ear acoustic neuroma, removed in 1999. No hearing in right ear.   Anxiety    Hx of, responds to Wellbutrin   Basal cell carcinoma    s/p MOHS surgery in 03/2011   Breast cancer (HCC) 2021   left lumpectomy   Cataract    Chronic anticoagulation 11/24/2016   Chronic cholecystitis with calculus 05/13/2013   Closed fracture of left lateral malleolus 08/10/2017   COPD (chronic obstructive pulmonary disease) (HCC)    Deafness in right ear    Embolic stroke (HCC) 11/24/2016   Family history of adverse reaction to anesthesia    Family hx of PONV   Fracture    left lateral malleolus fracture   GERD (gastroesophageal reflux disease)    Headache(784.0)    MIGRAINES   Hearing loss    Only in right ear, from an acoustic neuroma. S/P removal in 1999.  History of hiatal hernia    History of neck pain    Responds to Flexeril   History of shingles    Hypercholesteremia    Hypertension    Hypothyroidism    Melanoma (HCC) 2018   right arm-surgery only   PAF (paroxysmal atrial fibrillation) (HCC)    2 brief episodes of Afib recorded on monitor 02/2007   Paroxysmal atrial fibrillation (HCC)    PONV (postoperative nausea and vomiting)    Presence of permanent cardiac pacemaker 2018   for A-fib   Sick sinus syndrome (HCC) 12/19/2016   Sinus bradycardia    Tachycardia-bradycardia syndrome (HCC)  11/24/2016   TIA (transient ischemic attack) 2011   On coumadin. Asymptomatic, without recurrence.    Past Surgical History:  Procedure Laterality Date   ABDOMINAL HYSTERECTOMY     APPENDECTOMY     BREAST BIOPSY     X 3   BREAST CYST ASPIRATION Left 50 yrs ago per pt   BREAST EXCISIONAL BIOPSY Left 50 yrs ago   BREAST LUMPECTOMY WITH RADIOACTIVE SEED LOCALIZATION Left 06/21/2019   Procedure: LEFT BREAST LUMPECTOMY WITH RADIOACTIVE SEED LOCALIZATION;  Surgeon: Manus Rudd, MD;  Location: [facility];  Service: General;  Laterality: Left;   CHOLECYSTECTOMY N/A 05/26/2013   Procedure: LAPAROSCOPIC CHOLECYSTECTOMY WITH INTRAOPERATIVE CHOLANGIOGRAM;  Surgeon: Wilmon Arms. Corliss Skains, MD;  Location: [facility] OR;  Service: General;  Laterality: N/A;   CRANIECTOMY FOR EXCISION OF ACOUSTIC NEUROMA Right 1999   INTRAOCULAR LENS INSERTION     Dr. Vonna Kotyk   MOHS SURGERY  03/2011   Basal cell skin cancer   ORIF ANKLE FRACTURE Left 08/10/2017   Procedure: OPEN REDUCTION INTERNAL FIXATION (ORIF) LEFT ANKLE FRACTURE;  Surgeon: Samson Frederic, MD;  Location: [facility] OR;  Service: Orthopedics;  Laterality: Left;   PACEMAKER IMPLANT N/A 12/19/2016   Medtronic Azure XT MRI conditional dual-chamber pacemaker for symptomatic sinus bradycardia by Dr Johney Frame   TONSILLECTOMY      Current Outpatient Medications  Medication Sig Dispense Refill   Cholecalciferol (VITAMIN D3 PO) Take by mouth.     clonazePAM (KLONOPIN) 0.5 MG tablet Take 0.5 mg by mouth daily.     Docusate Sodium (DSS) 100 MG CAPS Take 1 tablet by mouth as needed. (Patient not taking: Reported on 02/19/2023)     enoxaparin (LOVENOX) 60 MG/0.6ML injection Inject 0.6 mLs (60 mg total) into the skin every 12 (twelve) hours. (Patient not taking: Reported on 02/11/2023) 12 mL 1   Evolocumab (REPATHA SURECLICK) 140 MG/ML SOAJ Inject 140 mg into the skin every 14 (fourteen) days. 2 mL 11   flecainide (TAMBOCOR) 50 MG tablet Take 1 tablet (50 mg total) by  mouth 2 (two) times daily. 180 tablet 3   FLUoxetine (PROZAC) 10 MG capsule Take 10 mg by mouth daily.     fluticasone (CUTIVATE) 0.05 % cream as needed. (Patient not taking: Reported on 02/19/2023)     fluticasone (FLONASE) 50 MCG/ACT nasal spray Place 1 spray into both nostrils daily.     JUBLIA 10 % SOLN apply to affected nails qd     levothyroxine (SYNTHROID) 100 MCG tablet Take 100 mcg by mouth every morning.     loratadine (CLARITIN) 10 MG tablet Take 10 mg by mouth daily as needed for allergies.     metoprolol succinate (TOPROL-XL) 50 MG 24 hr tablet Take 1 tablet (50 mg total) by mouth daily. Take with or immediately following a meal. 90 tablet 3   metoprolol tartrate (LOPRESSOR) 25 MG  tablet Take 0.5 tablets (12.5 mg total) by mouth 2 (two) times daily as needed (palpitations). 30 tablet 5   pantoprazole (PROTONIX) 40 MG tablet Take 40 mg by mouth 2 (two) times daily.     sucralfate (CARAFATE) 1 g tablet TAKE 1 TABLET BY MOUTH TWICE A DAY ON EMPTY STOMACH     vitamin B-12 (CYANOCOBALAMIN) 1000 MCG tablet 2 tablets     warfarin (COUMADIN) 2.5 MG tablet TAKE 1 AND 1/2 (ONE AND ONE HALF) TABLETS BY MOUTH DAILY AS DIRECTED BY COUMADIN CLINIC. 150 tablet 1   No current facility-administered medications for this visit.    Allergies:   Atorvastatin, Statins, Codeine, Ezetimibe, Latex, Penicillins, and Rosuvastatin   Social History:  The patient  reports that she has never smoked. She has never used smokeless tobacco. She reports current alcohol use. She reports that she does not use drugs.   Family History:  The patient's family history includes Alcoholism in her father; Brain cancer in her maternal aunt; Breast cancer in her maternal aunt; CVA in an other family member; Cancer in her maternal grandmother; Diabetes in her mother; Healthy in her sister; Heart disease in an other family member; High Cholesterol in her mother; Hyperlipidemia in an other family member; Hypertension in her mother  and another family member; Liver cancer in her father; Memory loss in her mother.  ROS:  Please see the history of present illness.    All other systems are reviewed and otherwise negative.   PHYSICAL EXAM:  VS:  There were no vitals taken for this visit. BMI: There is no height or weight on file to calculate BMI. Well nourished, well developed, in no acute distress HEENT: normocephalic, atraumatic Neck: no JVD, carotid bruits or masses Cardiac: *** RRR; no significant murmurs, no rubs, or gallops Lungs:  *** CTA b/l, no wheezing, rhonchi or rales Abd: soft, nontender MS: no deformity or Atrophy Ext: *** no edema Skin: warm and dry, no rash Neuro:  No gross deficits appreciated Psych: euthymic mood, full affect  *** PPM site is stable, no tethering or discomfort   EKG:  done today and reviewed by myself ***  Device interrogation done today and reviewed by myself:  Battery and lead measurements are good  ***  Myoview 11/13/21  Review of the above records today demonstrates:    The study is normal. The study is low risk.   No ST deviation was noted.   LV perfusion is normal. There is no evidence of ischemia. There is no evidence of infarction.   Left ventricular function is normal. Nuclear stress EF: 75 %. The left ventricular ejection fraction is hyperdynamic (>65%). End diastolic cavity size is normal. End systolic cavity size is normal.   Prior study available for comparison from 12/11/2016.    06/25/20; TTE 1. Left ventricular ejection fraction, by estimation, is 55 to 60%. The  left ventricle has normal function. The left ventricle has no regional  wall motion abnormalities. There is mild asymmetric left ventricular  hypertrophy of the basal and septal  segments. Left ventricular diastolic parameters are indeterminate. The  average left ventricular global longitudinal strain is -17.4 %.   2. Pacing wires in RA/RV . Right ventricular systolic function is normal.  The right  ventricular size is normal. There is normal pulmonary artery  systolic pressure.   3. Left atrial size was moderately dilated.   4. The mitral valve is normal in structure. Moderate mitral valve  regurgitation. No evidence of mitral  stenosis.   5. The aortic valve was not well visualized. There is moderate  calcification of the aortic valve. There is moderate thickening of the  aortic valve. Aortic valve regurgitation is mild. Mild to moderate aortic  valve sclerosis/calcification is present,  without any evidence of aortic stenosis.   6. The inferior vena cava is normal in size with greater than 50%  respiratory variability, suggesting right atrial pressure of 3 mmHg.   12/11/2016: stress myoview Clinically and electrically negative for ischemia Normal perfusion. No ischemia or scar This is a low risk study. LVEF is 62%   12/02/2016: TTE Study Conclusions  - Left ventricle: The cavity size was normal. Wall thickness was    normal. Systolic function was normal. The estimated ejection    fraction was in the range of 55% to 60%. Wall motion was normal;    there were no regional wall motion abnormalities. Features are    consistent with a pseudonormal left ventricular filling pattern,    with concomitant abnormal relaxation and increased filling    pressure (grade 2 diastolic dysfunction).  - Aortic valve: There was no stenosis.  - Mitral valve: Mildly calcified annulus. There was mild    regurgitation.  - Left atrium: The atrium was mildly to moderately dilated.  - Right ventricle: The cavity size was normal. Systolic function    was normal.  - Right atrium: The atrium was mildly dilated.  - Tricuspid valve: Peak RV-RA gradient (S): 27 mm Hg.  - Pulmonary arteries: PA peak pressure: 30 mm Hg (S).  - Inferior vena cava: The vessel was normal in size. The    respirophasic diameter changes were in the normal range (>= 50%),    consistent with normal central venous pressure.    Impressions:  - Normal LV size with EF 55-60%. Moderate diastolic dysfunction.    Normal RV size and systolic function. Mild mitral regurgitation.     Recent Labs: No results found for requested labs within last 365 days.  No results found for requested labs within last 365 days.   CrCl cannot be calculated (Patient's most recent lab result is older than the maximum 21 days allowed.).   Wt Readings from Last 3 Encounters:  02/19/23 142 lb 4.8 oz (64.5 kg)  02/11/23 140 lb (63.5 kg)  11/03/22 143 lb (64.9 kg)     Other studies reviewed: Additional studies/records reviewed today include: summarized above  ASSESSMENT AND PLAN:  1. PPM     *** Intact function     *** No programming changes made  2. paroxysmal Afib, flutter     AT     CHA2DS2Vasc is 6,  On warfarin     *** %     Flecainide and *** w/*** intervals            Disposition: ***     Current medicines are reviewed at length with the patient today.  The patient did not have any concerns regarding medicines.  Norma Fredrickson, PA-C 03/04/2023 12:49 PM     [facility] [address] [place] [address] [phone] (office)  [phone] (fax)

## 2023-03-10 ENCOUNTER — Ambulatory Visit: Payer: Medicare Other | Admitting: Physician Assistant

## 2023-03-11 NOTE — Progress Notes (Signed)
Cardiology Office Note Date:  03/11/2023  Patient ID:  [name], [name] May 07, 1945, MRN [id number] PCP:  Henrine Screws, MD  Cardiologist:  Dr. Katrinka Blazing (retired) > Dr. Shari Prows (inactive) Electrophysiologist: Dr. Johney Frame (inactive) >> Dr. Elberta Fortis      Chief Complaint:   f/u on flecainide re-start   History of Present Illness: [name] is a 77 y.o. female with history of anxiety, COPD, deaf (R ear), stroke, HTN, HLD, hypothyroidism, Afib, tachy-brady w/PPM.  I saw her 05/31/21 She is doing much better emotionally, on Wellbutrin and Zoloft  No CP, palpitations or cardiac awareness No SOB No near syncope or syncope. No bleeding or signs of bleeding Low arrhythmia burden No changes were made  She saw Dr. Elberta Fortis 07/01/22, reported recent increase in palpitations, not like her AFib, typically when active Low AF burden, Toprol increased  Saw Dr. Shari Prows last, 729/24, reported a 4 hour episode of Afib, palpitation burden not likely FAib given remote noting low AF burden, given PRN lopressor   Sept the pt reached out to office with increased palpitations, remote and d/w device clinic RN, suspected AFlutter w/RVR, 4.5 hours in duration, planned to come in for discussion  I saw her 02/11/23 She feels like in the last few months she is having more and more fast HRs. They do not feel exactly like her Afib felt, not as irregular, but quite fast. Uncomfortable feeling, no CP, but make her feel tired, SOB No clear triggers No dizzy spells near syncope or syncope. She feels like she has days that it is on/off over and over, though short lasting Others last several minutes When the RN told her was a 4 hour episode, she was surprised to hear that She has some orthopedic pains that limit her to some degree but of late she has slowed down because she is worried she will set her heart racing off Warfarin levels are steady, no bleeding or signs of bleeding She  arrives in a 1:1 tachycardia 115bpm via device rates 130's-150's with AFib/flutter episodes as well Resumed flecainide 50mg  BID with her metoprolol  TODAY She is doing well, feels better, more energy She has used the PRN metoprolol on a couple occassions feeling some palpitations now and then. No CP, SOB No near syncope or syncope No bleeding or signs of bleeding  She is achy all over, known to have OA, thought it may be the repatha but stopped it now a couple months and did not note any improvement.  Pending f/u with her PMD    Device information MDT dual chamber PPM, implanted 2018  AFib Hx diagnosis goes as far back as her Epic chart (2011)  AAD Hx Flecainide started 2018 BID eventually pill in pocket at an EP visit Jan 2021 pt states then taken off her list because she had not used it in years Flecainide restarted Nov 2024 with recurrent AT/AF   Past Medical History:  Diagnosis Date   Acoustic neuroma (HCC)    Hx of right ear acoustic neuroma, removed in 1999. No hearing in right ear.   Anxiety    Hx of, responds to Wellbutrin   Basal cell carcinoma    s/p MOHS surgery in 03/2011   Breast cancer (HCC) 2021   left lumpectomy   Cataract    Chronic anticoagulation 11/24/2016   Chronic cholecystitis with calculus 05/13/2013   Closed fracture of left lateral malleolus 08/10/2017   COPD (chronic obstructive pulmonary disease) (HCC)    Deafness in  right ear    Embolic stroke (HCC) 11/24/2016   Family history of adverse reaction to anesthesia    Family hx of PONV   Fracture    left lateral malleolus fracture   GERD (gastroesophageal reflux disease)    Headache(784.0)    MIGRAINES   Hearing loss    Only in right ear, from an acoustic neuroma. S/P removal in 1999.    History of hiatal hernia    History of neck pain    Responds to Flexeril   History of shingles    Hypercholesteremia    Hypertension    Hypothyroidism    Melanoma (HCC) 2018   right arm-surgery only    PAF (paroxysmal atrial fibrillation) (HCC)    2 brief episodes of Afib recorded on monitor 02/2007   Paroxysmal atrial fibrillation (HCC)    PONV (postoperative nausea and vomiting)    Presence of permanent cardiac pacemaker 2018   for A-fib   Sick sinus syndrome (HCC) 12/19/2016   Sinus bradycardia    Tachycardia-bradycardia syndrome (HCC) 11/24/2016   TIA (transient ischemic attack) 2011   On coumadin. Asymptomatic, without recurrence.    Past Surgical History:  Procedure Laterality Date   ABDOMINAL HYSTERECTOMY     APPENDECTOMY     BREAST BIOPSY     X 3   BREAST CYST ASPIRATION Left 50 yrs ago per pt   BREAST EXCISIONAL BIOPSY Left 50 yrs ago   BREAST LUMPECTOMY WITH RADIOACTIVE SEED LOCALIZATION Left 06/21/2019   Procedure: LEFT BREAST LUMPECTOMY WITH RADIOACTIVE SEED LOCALIZATION;  Surgeon: Manus Rudd, MD;  Location: [facility];  Service: General;  Laterality: Left;   CHOLECYSTECTOMY N/A 05/26/2013   Procedure: LAPAROSCOPIC CHOLECYSTECTOMY WITH INTRAOPERATIVE CHOLANGIOGRAM;  Surgeon: Wilmon Arms. Corliss Skains, MD;  Location: [facility] OR;  Service: General;  Laterality: N/A;   CRANIECTOMY FOR EXCISION OF ACOUSTIC NEUROMA Right 1999   INTRAOCULAR LENS INSERTION     Dr. Vonna Kotyk   MOHS SURGERY  03/2011   Basal cell skin cancer   ORIF ANKLE FRACTURE Left 08/10/2017   Procedure: OPEN REDUCTION INTERNAL FIXATION (ORIF) LEFT ANKLE FRACTURE;  Surgeon: Samson Frederic, MD;  Location: [facility] OR;  Service: Orthopedics;  Laterality: Left;   PACEMAKER IMPLANT N/A 12/19/2016   Medtronic Azure XT MRI conditional dual-chamber pacemaker for symptomatic sinus bradycardia by Dr Johney Frame   TONSILLECTOMY      Current Outpatient Medications  Medication Sig Dispense Refill   Cholecalciferol (VITAMIN D3 PO) Take by mouth.     clonazePAM (KLONOPIN) 0.5 MG tablet Take 0.5 mg by mouth daily.     Docusate Sodium (DSS) 100 MG CAPS Take 1 tablet by mouth as needed. (Patient not taking: Reported on  02/19/2023)     enoxaparin (LOVENOX) 60 MG/0.6ML injection Inject 0.6 mLs (60 mg total) into the skin every 12 (twelve) hours. (Patient not taking: Reported on 02/11/2023) 12 mL 1   Evolocumab (REPATHA SURECLICK) 140 MG/ML SOAJ Inject 140 mg into the skin every 14 (fourteen) days. 2 mL 11   flecainide (TAMBOCOR) 50 MG tablet Take 1 tablet (50 mg total) by mouth 2 (two) times daily. 180 tablet 3   FLUoxetine (PROZAC) 10 MG capsule Take 10 mg by mouth daily.     fluticasone (CUTIVATE) 0.05 % cream as needed. (Patient not taking: Reported on 02/19/2023)     fluticasone (FLONASE) 50 MCG/ACT nasal spray Place 1 spray into both nostrils daily.     JUBLIA 10 % SOLN apply to affected nails qd  levothyroxine (SYNTHROID) 100 MCG tablet Take 100 mcg by mouth every morning.     loratadine (CLARITIN) 10 MG tablet Take 10 mg by mouth daily as needed for allergies.     metoprolol succinate (TOPROL-XL) 50 MG 24 hr tablet Take 1 tablet (50 mg total) by mouth daily. Take with or immediately following a meal. 90 tablet 3   metoprolol tartrate (LOPRESSOR) 25 MG tablet Take 0.5 tablets (12.5 mg total) by mouth 2 (two) times daily as needed (palpitations). 30 tablet 5   pantoprazole (PROTONIX) 40 MG tablet Take 40 mg by mouth 2 (two) times daily.     sucralfate (CARAFATE) 1 g tablet TAKE 1 TABLET BY MOUTH TWICE A DAY ON EMPTY STOMACH     vitamin B-12 (CYANOCOBALAMIN) 1000 MCG tablet 2 tablets     warfarin (COUMADIN) 2.5 MG tablet TAKE 1 AND 1/2 (ONE AND ONE HALF) TABLETS BY MOUTH DAILY AS DIRECTED BY COUMADIN CLINIC. 150 tablet 1   No current facility-administered medications for this visit.    Allergies:   Atorvastatin, Statins, Codeine, Ezetimibe, Latex, Penicillins, and Rosuvastatin   Social History:  The patient  reports that she has never smoked. She has never used smokeless tobacco. She reports current alcohol use. She reports that she does not use drugs.   Family History:  The patient's family history  includes Alcoholism in her father; Brain cancer in her maternal aunt; Breast cancer in her maternal aunt; CVA in an other family member; Cancer in her maternal grandmother; Diabetes in her mother; Healthy in her sister; Heart disease in an other family member; High Cholesterol in her mother; Hyperlipidemia in an other family member; Hypertension in her mother and another family member; Liver cancer in her father; Memory loss in her mother.  ROS:  Please see the history of present illness.    All other systems are reviewed and otherwise negative.   PHYSICAL EXAM:  VS:  There were no vitals taken for this visit. BMI: There is no height or weight on file to calculate BMI. Well nourished, well developed, in no acute distress HEENT: normocephalic, atraumatic Neck: no JVD, carotid bruits or masses Cardiac: RRR; no significant murmurs, no rubs, or gallops Lungs:  CTA b/l, no wheezing, rhonchi or rales Abd: soft, nontender MS: no deformity or Atrophy Ext: no edema Skin: warm and dry, no rash Neuro:  No gross deficits appreciated Psych: euthymic mood, full affect  PPM site is stable, no tethering or discomfort   EKG:  done today and reviewed by myself AP/VS 65bpm, QRS 86ms, QTc  Device interrogation done today and reviewed by myself:  Battery and lead measurements are good No AF or HVR episodes   Myoview 11/13/21  Review of the above records today demonstrates:    The study is normal. The study is low risk.   No ST deviation was noted.   LV perfusion is normal. There is no evidence of ischemia. There is no evidence of infarction.   Left ventricular function is normal. Nuclear stress EF: 75 %. The left ventricular ejection fraction is hyperdynamic (>65%). End diastolic cavity size is normal. End systolic cavity size is normal.   Prior study available for comparison from 12/11/2016.    06/25/20; TTE 1. Left ventricular ejection fraction, by estimation, is 55 to 60%. The  left ventricle  has normal function. The left ventricle has no regional  wall motion abnormalities. There is mild asymmetric left ventricular  hypertrophy of the basal and septal  segments.  Left ventricular diastolic parameters are indeterminate. The  average left ventricular global longitudinal strain is -17.4 %.   2. Pacing wires in RA/RV . Right ventricular systolic function is normal.  The right ventricular size is normal. There is normal pulmonary artery  systolic pressure.   3. Left atrial size was moderately dilated.   4. The mitral valve is normal in structure. Moderate mitral valve  regurgitation. No evidence of mitral stenosis.   5. The aortic valve was not well visualized. There is moderate  calcification of the aortic valve. There is moderate thickening of the  aortic valve. Aortic valve regurgitation is mild. Mild to moderate aortic  valve sclerosis/calcification is present,  without any evidence of aortic stenosis.   6. The inferior vena cava is normal in size with greater than 50%  respiratory variability, suggesting right atrial pressure of 3 mmHg.   12/11/2016: stress myoview Clinically and electrically negative for ischemia Normal perfusion. No ischemia or scar This is a low risk study. LVEF is 62%   12/02/2016: TTE Study Conclusions  - Left ventricle: The cavity size was normal. Wall thickness was    normal. Systolic function was normal. The estimated ejection    fraction was in the range of 55% to 60%. Wall motion was normal;    there were no regional wall motion abnormalities. Features are    consistent with a pseudonormal left ventricular filling pattern,    with concomitant abnormal relaxation and increased filling    pressure (grade 2 diastolic dysfunction).  - Aortic valve: There was no stenosis.  - Mitral valve: Mildly calcified annulus. There was mild    regurgitation.  - Left atrium: The atrium was mildly to moderately dilated.  - Right ventricle: The cavity size was  normal. Systolic function    was normal.  - Right atrium: The atrium was mildly dilated.  - Tricuspid valve: Peak RV-RA gradient (S): 27 mm Hg.  - Pulmonary arteries: PA peak pressure: 30 mm Hg (S).  - Inferior vena cava: The vessel was normal in size. The    respirophasic diameter changes were in the normal range (>= 50%),    consistent with normal central venous pressure.   Impressions:  - Normal LV size with EF 55-60%. Moderate diastolic dysfunction.    Normal RV size and systolic function. Mild mitral regurgitation.     Recent Labs: No results found for requested labs within last 365 days.  No results found for requested labs within last 365 days.   CrCl cannot be calculated (Patient's most recent lab result is older than the maximum 21 days allowed.).   Wt Readings from Last 3 Encounters:  02/19/23 142 lb 4.8 oz (64.5 kg)  02/11/23 140 lb (63.5 kg)  11/03/22 143 lb (64.9 kg)     Other studies reviewed: Additional studies/records reviewed today include: summarized above  ASSESSMENT AND PLAN:  1. PPM     Intact function     No programming changes made  2. paroxysmal Afib, flutter     AT     CHA2DS2Vasc is 6,  On warfarin     zero %     Flecainide and metoprolol w/stable intervals  3. Secondary hypercoagulable state            Disposition: back in 42mo, sooner if needed     Current medicines are reviewed at length with the patient today.  The patient did not have any concerns regarding medicines.  Norma Fredrickson, PA-C 03/11/2023 7:07  AM     [facility] [address] [place] [address] [phone] (office)  [phone] (fax)

## 2023-03-13 ENCOUNTER — Encounter: Payer: Self-pay | Admitting: Physician Assistant

## 2023-03-13 ENCOUNTER — Ambulatory Visit: Payer: Medicare Other | Attending: Physician Assistant | Admitting: Physician Assistant

## 2023-03-13 ENCOUNTER — Other Ambulatory Visit: Payer: Self-pay

## 2023-03-13 VITALS — BP 108/80 | HR 65 | Ht 65.0 in | Wt 146.2 lb

## 2023-03-13 DIAGNOSIS — Z950 Presence of cardiac pacemaker: Secondary | ICD-10-CM | POA: Diagnosis not present

## 2023-03-13 DIAGNOSIS — Z79899 Other long term (current) drug therapy: Secondary | ICD-10-CM | POA: Insufficient documentation

## 2023-03-13 DIAGNOSIS — Z5181 Encounter for therapeutic drug level monitoring: Secondary | ICD-10-CM | POA: Insufficient documentation

## 2023-03-13 DIAGNOSIS — I480 Paroxysmal atrial fibrillation: Secondary | ICD-10-CM | POA: Diagnosis not present

## 2023-03-13 DIAGNOSIS — D6869 Other thrombophilia: Secondary | ICD-10-CM | POA: Insufficient documentation

## 2023-03-13 LAB — CUP PACEART INCLINIC DEVICE CHECK
Battery Remaining Longevity: 87 mo
Battery Voltage: 2.98 V
Brady Statistic AP VP Percent: 0.05 %
Brady Statistic AP VS Percent: 99.27 %
Brady Statistic AS VP Percent: 0 %
Brady Statistic AS VS Percent: 0.68 %
Brady Statistic RA Percent Paced: 99.38 %
Brady Statistic RV Percent Paced: 0.05 %
Date Time Interrogation Session: 20241206094145
Implantable Lead Connection Status: 753985
Implantable Lead Connection Status: 753985
Implantable Lead Implant Date: 20180914
Implantable Lead Implant Date: 20180914
Implantable Lead Location: 753859
Implantable Lead Location: 753860
Implantable Lead Model: 5076
Implantable Lead Model: 5076
Implantable Pulse Generator Implant Date: 20180914
Lead Channel Impedance Value: 304 Ohm
Lead Channel Impedance Value: 380 Ohm
Lead Channel Impedance Value: 494 Ohm
Lead Channel Impedance Value: 589 Ohm
Lead Channel Pacing Threshold Amplitude: 0.75 V
Lead Channel Pacing Threshold Amplitude: 0.875 V
Lead Channel Pacing Threshold Pulse Width: 0.4 ms
Lead Channel Pacing Threshold Pulse Width: 0.4 ms
Lead Channel Sensing Intrinsic Amplitude: 13.625 mV
Lead Channel Sensing Intrinsic Amplitude: 16.5 mV
Lead Channel Sensing Intrinsic Amplitude: 2.625 mV
Lead Channel Sensing Intrinsic Amplitude: 2.625 mV
Lead Channel Setting Pacing Amplitude: 1.5 V
Lead Channel Setting Pacing Amplitude: 2.5 V
Lead Channel Setting Pacing Pulse Width: 0.4 ms
Lead Channel Setting Sensing Sensitivity: 0.9 mV
Zone Setting Status: 755011
Zone Setting Status: 755011

## 2023-03-13 NOTE — Patient Instructions (Addendum)
Medication Instructions:   Your physician recommends that you continue on your current medications as directed. Please refer to the Current Medication list given to you today.  *If you need a refill on your cardiac medications before your next appointment, please call your pharmacy*   Lab Work: [place]   If you have labs (blood work) drawn today and your tests are completely normal, you will receive your results only by: [place] (if you have MyChart) OR A paper copy in the mail If you have any lab test that is abnormal or we need to change your treatment, we will call you to review the results.   Testing/Procedures: NONE ORDERED  TODAY     Follow-Up: At [facility], you and your health needs are our priority.  As part of our continuing mission to provide you with exceptional heart care, we have created designated Provider Care Teams.  These Care Teams include your primary Cardiologist (physician) and Advanced Practice Providers (APPs -  Physician Assistants and Nurse Practitioners) who all work together to provide you with the care you need, when you need it.  We recommend signing up for the patient portal called "MyChart".  Sign up information is provided on this After Visit Summary.  MyChart is used to connect with patients for Virtual Visits (Telemedicine).  Patients are able to view lab/test results, encounter notes, upcoming appointments, etc.  Non-urgent messages can be sent to your provider as well.   To learn more about what you can do with MyChart, go to [email or web address].    Your next appointment:   4 month(s)  Provider:   You may see   Tommye Standard, PA-C   Other Instructions

## 2023-03-16 ENCOUNTER — Ambulatory Visit: Payer: Medicare Other

## 2023-03-19 ENCOUNTER — Ambulatory Visit: Payer: Medicare Other | Attending: Cardiovascular Disease | Admitting: *Deleted

## 2023-03-19 DIAGNOSIS — Z5181 Encounter for therapeutic drug level monitoring: Secondary | ICD-10-CM | POA: Diagnosis not present

## 2023-03-19 DIAGNOSIS — I480 Paroxysmal atrial fibrillation: Secondary | ICD-10-CM | POA: Diagnosis not present

## 2023-03-19 LAB — POCT INR: INR: 3.7 — AB (ref 2.0–3.0)

## 2023-03-19 NOTE — Patient Instructions (Signed)
Description   Do not take any Warfarin today, then START taking Warfarin 1.5 tablets daily except 1 tablet on Mondays. Recheck INR in 3 weeks. Stay consistent with greens (2 per week).  Coumadin Clinic [phone]

## 2023-04-09 ENCOUNTER — Ambulatory Visit: Payer: Medicare Other | Attending: Internal Medicine | Admitting: *Deleted

## 2023-04-09 DIAGNOSIS — I480 Paroxysmal atrial fibrillation: Secondary | ICD-10-CM | POA: Insufficient documentation

## 2023-04-09 DIAGNOSIS — Z5181 Encounter for therapeutic drug level monitoring: Secondary | ICD-10-CM | POA: Insufficient documentation

## 2023-04-09 LAB — POCT INR: INR: 3.4 — AB (ref 2.0–3.0)

## 2023-04-09 NOTE — Patient Instructions (Signed)
 Hold warfarin tonight then decrease dose to 1 1/2 tablets daily except 1 tablet on Mondays, Wednesdays and Fridays. Recheck INR in 3 weeks. Stay consistent with greens (2 per week).  Coumadin Clinic [phone]

## 2023-04-20 ENCOUNTER — Ambulatory Visit (INDEPENDENT_AMBULATORY_CARE_PROVIDER_SITE_OTHER): Payer: Medicare Other

## 2023-04-20 DIAGNOSIS — I495 Sick sinus syndrome: Secondary | ICD-10-CM

## 2023-04-20 LAB — CUP PACEART REMOTE DEVICE CHECK
Battery Remaining Longevity: 85 mo
Battery Voltage: 2.98 V
Brady Statistic AP VP Percent: 0.05 %
Brady Statistic AP VS Percent: 99.23 %
Brady Statistic AS VP Percent: 0 %
Brady Statistic AS VS Percent: 0.72 %
Brady Statistic RA Percent Paced: 99.28 %
Brady Statistic RV Percent Paced: 0.05 %
Date Time Interrogation Session: 20250112205527
Implantable Lead Connection Status: 753985
Implantable Lead Connection Status: 753985
Implantable Lead Implant Date: 20180914
Implantable Lead Implant Date: 20180914
Implantable Lead Location: 753859
Implantable Lead Location: 753860
Implantable Lead Model: 5076
Implantable Lead Model: 5076
Implantable Pulse Generator Implant Date: 20180914
Lead Channel Impedance Value: 304 Ohm
Lead Channel Impedance Value: 361 Ohm
Lead Channel Impedance Value: 418 Ohm
Lead Channel Impedance Value: 494 Ohm
Lead Channel Pacing Threshold Amplitude: 0.75 V
Lead Channel Pacing Threshold Amplitude: 0.75 V
Lead Channel Pacing Threshold Pulse Width: 0.4 ms
Lead Channel Pacing Threshold Pulse Width: 0.4 ms
Lead Channel Sensing Intrinsic Amplitude: 11.75 mV
Lead Channel Sensing Intrinsic Amplitude: 11.75 mV
Lead Channel Sensing Intrinsic Amplitude: 2.375 mV
Lead Channel Sensing Intrinsic Amplitude: 2.375 mV
Lead Channel Setting Pacing Amplitude: 1.5 V
Lead Channel Setting Pacing Amplitude: 2.5 V
Lead Channel Setting Pacing Pulse Width: 0.4 ms
Lead Channel Setting Sensing Sensitivity: 0.9 mV
Zone Setting Status: 755011
Zone Setting Status: 755011

## 2023-04-30 ENCOUNTER — Ambulatory Visit: Payer: Medicare Other | Attending: Internal Medicine | Admitting: *Deleted

## 2023-04-30 DIAGNOSIS — Z5181 Encounter for therapeutic drug level monitoring: Secondary | ICD-10-CM | POA: Diagnosis not present

## 2023-04-30 DIAGNOSIS — I480 Paroxysmal atrial fibrillation: Secondary | ICD-10-CM | POA: Diagnosis not present

## 2023-04-30 LAB — POCT INR: INR: 2.4 (ref 2.0–3.0)

## 2023-04-30 NOTE — Patient Instructions (Addendum)
Description   Continue taking warfarin 1 1/2 tablets daily except 1 tablet on Mondays, Wednesdays and Fridays. Recheck INR in 5 weeks with MD Appt. Stay consistent with greens (2 per week).  Coumadin Clinic [phone]

## 2023-05-13 ENCOUNTER — Other Ambulatory Visit: Payer: Self-pay | Admitting: Family Medicine

## 2023-05-13 DIAGNOSIS — Z1231 Encounter for screening mammogram for malignant neoplasm of breast: Secondary | ICD-10-CM

## 2023-05-31 NOTE — Progress Notes (Unsigned)
 Cardiology Office Note:   Date:  06/03/2023  ID:  [name], DOB 07/04/1945, MRN [id number]  History of Present Illness:   [name] is a 78 y.o. female with history of COPD, CVA, HTN, HLD, tachy-brady syndrome s/p PPM placement, Afib, mild ascending aortic dilation who presents for follow-up.  She previously followed with Dr. Shari Prows, last seen 11/03/2022.    Myoview 11/2021 normal and low risk. Last TTE 06/2020 with LVEF 55-60%, normal RV, moderate LAE, moderate MR, mild AR.   Since last clinic visit, she reports her palpitations have significantly improved since starting flecainide.  Does report she has been getting chest pain, describes as left-sided aching pain that occurs a couple times per week.  States that exertion brings on chest pain as well as shortness of breath.  She was having myalgias and stopped her Repatha for the last several months but reports she continues to have myalgias.  Past Medical History:  Diagnosis Date   Acoustic neuroma (HCC)    Hx of right ear acoustic neuroma, removed in 1999. No hearing in right ear.   Anxiety    Hx of, responds to Wellbutrin   Basal cell carcinoma    s/p MOHS surgery in 03/2011   Breast cancer (HCC) 2021   left lumpectomy   Cataract    Chronic anticoagulation 11/24/2016   Chronic cholecystitis with calculus 05/13/2013   Closed fracture of left lateral malleolus 08/10/2017   COPD (chronic obstructive pulmonary disease) (HCC)    Deafness in right ear    Embolic stroke (HCC) 11/24/2016   Family history of adverse reaction to anesthesia    Family hx of PONV   Fracture    left lateral malleolus fracture   GERD (gastroesophageal reflux disease)    Headache(784.0)    MIGRAINES   Hearing loss    Only in right ear, from an acoustic neuroma. S/P removal in 1999.    History of hiatal hernia    History of neck pain    Responds to Flexeril   History of shingles    Hypercholesteremia    Hypertension     Hypothyroidism    Melanoma (HCC) 2018   right arm-surgery only   PAF (paroxysmal atrial fibrillation) (HCC)    2 brief episodes of Afib recorded on monitor 02/2007   Paroxysmal atrial fibrillation (HCC)    PONV (postoperative nausea and vomiting)    Presence of permanent cardiac pacemaker 2018   for A-fib   Sick sinus syndrome (HCC) 12/19/2016   Sinus bradycardia    Tachycardia-bradycardia syndrome (HCC) 11/24/2016   TIA (transient ischemic attack) 2011   On coumadin. Asymptomatic, without recurrence.     ROS: As per HPI  Studies Reviewed:    EKG:  No new tracing today  Cardiac Studies & Procedures   ______________________________________________________________________________________________   STRESS TESTS  MYOCARDIAL PERFUSION IMAGING 11/13/2021  Narrative   The study is normal. The study is low risk.   No ST deviation was noted.   LV perfusion is normal. There is no evidence of ischemia. There is no evidence of infarction.   Left ventricular function is normal. Nuclear stress EF: 75 %. The left ventricular ejection fraction is hyperdynamic (>65%). End diastolic cavity size is normal. End systolic cavity size is normal.   Prior study available for comparison from 12/11/2016.   ECHOCARDIOGRAM  ECHOCARDIOGRAM COMPLETE 06/25/2020  Narrative ECHOCARDIOGRAM REPORT    Patient Name:   [name] Date of Exam: 06/25/2020 Medical Rec #:  [id number]          Height:       65.0 in Accession #:    [id number]         Weight:       152.4 lb Date of Birth:  1945/11/29          BSA:          1.762 m Patient Age:    75 years           BP:           122/87 mmHg Patient Gender: F                  HR:           69 bpm. Exam Location:  [address]  Procedure: 2D Echo, Cardiac Doppler, Color Doppler and Strain Analysis  Indications:    R06.00 SOB  History:        Patient has prior history of Echocardiogram examinations, most recent 12/02/2016. TIA, Stroke and COPD,  Arrythmias:Atrial Fibrillation; Risk Factors:Hypertension.  Sonographer:    Clearence Ped RCS Referring Phys: [id number] RENEE LYNN URSUY  IMPRESSIONS   1. Left ventricular ejection fraction, by estimation, is 55 to 60%. The left ventricle has normal function. The left ventricle has no regional wall motion abnormalities. There is mild asymmetric left ventricular hypertrophy of the basal and septal segments. Left ventricular diastolic parameters are indeterminate. The average left ventricular global longitudinal strain is -17.4 %. 2. Pacing wires in RA/RV . Right ventricular systolic function is normal. The right ventricular size is normal. There is normal pulmonary artery systolic pressure. 3. Left atrial size was moderately dilated. 4. The mitral valve is normal in structure. Moderate mitral valve regurgitation. No evidence of mitral stenosis. 5. The aortic valve was not well visualized. There is moderate calcification of the aortic valve. There is moderate thickening of the aortic valve. Aortic valve regurgitation is mild. Mild to moderate aortic valve sclerosis/calcification is present, without any evidence of aortic stenosis. 6. The inferior vena cava is normal in size with greater than 50% respiratory variability, suggesting right atrial pressure of 3 mmHg.  FINDINGS Left Ventricle: Left ventricular ejection fraction, by estimation, is 55 to 60%. The left ventricle has normal function. The left ventricle has no regional wall motion abnormalities. The average left ventricular global longitudinal strain is -17.4 %. 3D left ventricular ejection fraction analysis performed but not reported based on interpreter judgement due to suboptimal quality. The left ventricular internal cavity size was normal in size. There is mild asymmetric left ventricular hypertrophy of the basal and septal segments. Left ventricular diastolic parameters are indeterminate.  Right Ventricle: Pacing wires in RA/RV. The  right ventricular size is normal. No increase in right ventricular wall thickness. Right ventricular systolic function is normal. There is normal pulmonary artery systolic pressure. The tricuspid regurgitant velocity is 2.04 m/s, and with an assumed right atrial pressure of 3 mmHg, the estimated right ventricular systolic pressure is 19.6 mmHg.  Left Atrium: Left atrial size was moderately dilated.  Right Atrium: Right atrial size was normal in size.  Pericardium: There is no evidence of pericardial effusion.  Mitral Valve: The mitral valve is normal in structure. Moderate mitral valve regurgitation. No evidence of mitral valve stenosis.  Tricuspid Valve: The tricuspid valve is normal in structure. Tricuspid valve regurgitation is mild . No evidence of tricuspid stenosis.  Aortic Valve: The aortic valve was not well visualized. There is moderate calcification of the  aortic valve. There is moderate thickening of the aortic valve. Aortic valve regurgitation is mild. Aortic regurgitation PHT measures 877 msec. Mild to moderate aortic valve sclerosis/calcification is present, without any evidence of aortic stenosis.  Pulmonic Valve: The pulmonic valve was normal in structure. Pulmonic valve regurgitation is not visualized. No evidence of pulmonic stenosis.  Aorta: The aortic root is normal in size and structure.  Venous: The inferior vena cava is normal in size with greater than 50% respiratory variability, suggesting right atrial pressure of 3 mmHg.  IAS/Shunts: No atrial level shunt detected by color flow Doppler.   LEFT VENTRICLE PLAX 2D LVIDd:         4.40 cm  Diastology LVIDs:         3.00 cm  LV e' medial:    6.20 cm/s LV PW:         0.80 cm  LV E/e' medial:  17.2 LV IVS:        1.00 cm  LV e' lateral:   13.60 cm/s LVOT diam:     2.00 cm  LV E/e' lateral: 7.8 LV SV:         54 LV SV Index:   31       2D Longitudinal Strain LVOT Area:     3.14 cm 2D Strain GLS Avg:     -17.4  %   RIGHT VENTRICLE RV Basal diam:  3.50 cm RV S prime:     8.49 cm/s RVSP:           19.6 mmHg  LEFT ATRIUM             Index       RIGHT ATRIUM           Index LA diam:        4.30 cm 2.44 cm/m  RA Pressure: 3.00 mmHg LA Vol (A2C):   68.9 ml 39.10 ml/m RA Area:     11.60 cm LA Vol (A4C):   27.7 ml 15.72 ml/m RA Volume:   27.30 ml  15.49 ml/m LA Biplane Vol: 45.8 ml 25.99 ml/m AORTIC VALVE LVOT Vmax:   80.90 cm/s LVOT Vmean:  51.000 cm/s LVOT VTI:    0.172 m AI PHT:      877 msec  AORTA Ao Root diam: 3.20 cm Ao Asc diam:  3.30 cm  MITRAL VALVE                 TRICUSPID VALVE MV Area (PHT):               TR Peak grad:   16.6 mmHg MV Decel Time:               TR Vmax:        204.00 cm/s MR Peak grad:    76.4 mmHg   Estimated RAP:  3.00 mmHg MR Mean grad:    55.0 mmHg   RVSP:           19.6 mmHg MR Vmax:         437.00 cm/s MR Vmean:        354.0 cm/s  SHUNTS MR PISA:         1.57 cm    Systemic VTI:  0.17 m MR PISA Eff ROA: 11 mm      Systemic Diam: 2.00 cm MR PISA Radius:  0.50 cm MV E velocity: 106.50 cm/s  Charlton Haws MD Electronically signed by Charlton Haws MD Signature Date/Time: 06/25/2020/3:01:35 PM  Final    MONITORS  CARDIAC EVENT MONITOR 08/08/2014       ______________________________________________________________________________________________       Risk Assessment/Calculations:    CHA2DS2-VASc Score = 6  This indicates a 9.7% annual risk of stroke. The patient's score is based upon: CHF History: 0 HTN History: 0 Diabetes History: 0 Stroke History: 2 Vascular Disease History: 1 Age Score: 2 Gender Score: 1           Physical Exam:   VS:  BP 98/66 (BP Location: Left Arm, Patient Position: Sitting, Cuff Size: Normal)   Pulse 84   Ht 5\' 4"  (1.626 m)   Wt 148 lb (67.1 kg)   BMI 25.40 kg/m    Wt Readings from Last 3 Encounters:  06/03/23 148 lb (67.1 kg)  03/13/23 146 lb 3.2 oz (66.3 kg)  02/19/23 142 lb 4.8 oz (64.5 kg)      GEN: Well nourished, well developed in no acute distress NECK: No JVD; No carotid bruits CARDIAC: RRR, no murmurs, rubs, gallops RESPIRATORY:  Clear to auscultation without rales, wheezing or rhonchi  ABDOMEN: Soft, non-tender, non-distended EXTREMITIES:  No edema; No deformity   ASSESSMENT AND PLAN:   #Paroxsymal Afib: -CHADs-vasc 6 -Continue metop 50mg  XL daily.  Also on metoprolol 12.5mg  BID prn for palpitations -Started on flecainide by EP 02/2023, reports palpitations significantly improved since starting flecainide -Continue warfarin for [facility].  Discussed switching to DOAC but her preference is to stay on warfarin  #Chest pain -She is reporting chest pain brought on by exertion, concerning for possible angina.  Negative Lexiscan Myoview in 2023.  Given soft BP with elevated heart rate despite being on Toprol-XL 50 mg, likely not a good coronary CTA candidate.  Will check stress PET to evaluate for ischemia  #Tachy-Brady Syndrome: -S/p PPM placement -Follows with Dr. Elberta Fortis  #HLD: #Aortic Atherosclerosis: -Most recent LDL 151 on 12/2022.  Reports she stopped taking Repatha as was having myalgias but reports no change in her myalgias since stopping Repatha.  Recommend restarting Repatha and check fasting lipid panel in 3 months  #Mild Ascending Aortic Dilation: -Measures 4.1cm on CT 10/2022 -Continue serial monitoring    #Mitral regurgitation -Moderate mitral regurgitation on echocardiogram 06/2020.  Will repeat echo to monitor     Signed, Little Ishikawa, MD    Informed Consent   Shared Decision Making/Informed Consent The risks [chest pain, shortness of breath, cardiac arrhythmias, dizziness, blood pressure fluctuations, myocardial infarction, stroke/transient ischemic attack, nausea, vomiting, allergic reaction, radiation exposure, metallic taste sensation and life-threatening complications (estimated to be 1 in 10,000)], benefits (risk stratification, diagnosing  coronary artery disease, treatment guidance) and alternatives of a cardiac PET stress test were discussed in detail with Ms. Sebek and she agrees to proceed.

## 2023-06-01 NOTE — Progress Notes (Signed)
 Remote pacemaker transmission.

## 2023-06-03 ENCOUNTER — Ambulatory Visit: Payer: Medicare Other | Attending: Cardiology | Admitting: Cardiology

## 2023-06-03 ENCOUNTER — Ambulatory Visit: Payer: Medicare Other

## 2023-06-03 ENCOUNTER — Encounter: Payer: Self-pay | Admitting: Cardiology

## 2023-06-03 VITALS — BP 98/66 | HR 84 | Ht 64.0 in | Wt 148.0 lb

## 2023-06-03 DIAGNOSIS — Z5181 Encounter for therapeutic drug level monitoring: Secondary | ICD-10-CM

## 2023-06-03 DIAGNOSIS — I340 Nonrheumatic mitral (valve) insufficiency: Secondary | ICD-10-CM | POA: Diagnosis not present

## 2023-06-03 DIAGNOSIS — R079 Chest pain, unspecified: Secondary | ICD-10-CM | POA: Diagnosis not present

## 2023-06-03 DIAGNOSIS — E785 Hyperlipidemia, unspecified: Secondary | ICD-10-CM | POA: Diagnosis not present

## 2023-06-03 DIAGNOSIS — I480 Paroxysmal atrial fibrillation: Secondary | ICD-10-CM | POA: Diagnosis not present

## 2023-06-03 DIAGNOSIS — I495 Sick sinus syndrome: Secondary | ICD-10-CM | POA: Diagnosis not present

## 2023-06-03 LAB — POCT INR: INR: 2.8 (ref 2.0–3.0)

## 2023-06-03 MED ORDER — REPATHA SURECLICK 140 MG/ML ~~LOC~~ SOAJ: 1.0000 mL | SUBCUTANEOUS | 11 refills | Status: AC

## 2023-06-03 NOTE — Patient Instructions (Signed)
 Medication Instructions:  Restart Repatha injections, new refill sent. *If you need a refill on your cardiac medications before your next appointment, please call your pharmacy*   Lab Work: Fasting Lipid panel in 3 months. Order is in computer. If you have labs (blood work) drawn today and your tests are completely normal, you will receive your results only by: MyChart Message (if you have MyChart) OR A paper copy in the mail If you have any lab test that is abnormal or we need to change your treatment, we will call you to review the results.   Testing/Procedures: Your physician has requested that you have an echocardiogram. Echocardiography is a painless test that uses sound waves to create images of your heart. It provides your doctor with information about the size and shape of your heart and how well your heart's chambers and valves are working. This procedure takes approximately one hour. There are no restrictions for this procedure. Please do NOT wear cologne, perfume, aftershave, or lotions (deodorant is allowed). Please arrive 15 minutes prior to your appointment time.[address].  Please note: We ask at that you not bring children with you during ultrasound (echo/ vascular) testing. Due to room size and safety concerns, children are not allowed in the ultrasound rooms during exams. Our front office staff cannot provide observation of children in our lobby area while testing is being conducted. An adult accompanying a patient to their appointment will only be allowed in the ultrasound room at the discretion of the ultrasound technician under special circumstances. We apologize for any inconvenience.       Please report to Radiology at the [facility] Main Entrance 30 minutes early for your test.  [address] [place], [place] [address]                           How to Prepare for Your Cardiac PET/CT Stress Test:  Nothing to eat or drink, except water, 3  hours prior to arrival time.  NO caffeine/decaffeinated products, or chocolate 12 hours prior to arrival. (Please note decaffeinated beverages (teas/coffees) still contain caffeine).  If you have caffeine within 12 hours prior, the test will need to be rescheduled.  Medication instructions: Do not take erectile dysfunction medications for 72 hours prior to test (sildenafil, tadalafil) Do not take nitrates (isosorbide mononitrate, Ranexa) the day before or day of test Do not take tamsulosin the day before or morning of test Hold theophylline containing medications for 12 hours. Hold Dipyridamole 48 hours prior to the test.  Diabetic Preparation: If able to eat breakfast prior to 3 hour fasting, you may take all medications, including your insulin. Do not worry if you miss your breakfast dose of insulin - start at your next meal. If you do not eat prior to 3 hour fast-Hold all diabetes (oral and insulin) medications. Patients who wear a continuous glucose monitor MUST remove the device prior to scanning.  You may take your remaining medications with water.  NO perfume, cologne or lotion on chest or abdomen area. FEMALES - Please avoid wearing dresses to this appointment.  Total time is 1 to 2 hours; you may want to bring reading material for the waiting time.  IF YOU THINK YOU MAY BE PREGNANT, OR ARE NURSING PLEASE INFORM THE TECHNOLOGIST.  In preparation for your appointment, medication and supplies will be purchased.  Appointment availability is limited, so if you need to cancel or reschedule,  please call the Radiology Department Scheduler at [phone] 24 hours in advance to avoid a cancellation fee of $100.00  What to Expect When you Arrive:  Once you arrive and check in for your appointment, you will be taken to a preparation room within the Radiology Department.  A technologist or Nurse will obtain your medical history, verify that you are correctly prepped for the exam, and explain  the procedure.  Afterwards, an IV will be started in your arm and electrodes will be placed on your skin for EKG monitoring during the stress portion of the exam. Then you will be escorted to the PET/CT scanner.  There, staff will get you positioned on the scanner and obtain a blood pressure and EKG.  During the exam, you will continue to be connected to the EKG and blood pressure machines.  A small, safe amount of a radioactive tracer will be injected in your IV to obtain a series of pictures of your heart along with an injection of a stress agent.    After your Exam:  It is recommended that you eat a meal and drink a caffeinated beverage to counter act any effects of the stress agent.  Drink plenty of fluids for the remainder of the day and urinate frequently for the first couple of hours after the exam.  Your doctor will inform you of your test results within 7-10 business days.  For more information and frequently asked questions, please visit our website: [email or web address]  For questions about your test or how to prepare for your test, please call: Cardiac Imaging Nurse Navigators Office: [phone]  Follow-Up: At [facility], you and your health needs are our priority.  As part of our continuing mission to provide you with exceptional heart care, we have created designated Provider Care Teams.  These Care Teams include your primary Cardiologist (physician) and Advanced Practice Providers (APPs -  Physician Assistants and Nurse Practitioners) who all work together to provide you with the care you need, when you need it.  We recommend signing up for the patient portal called "MyChart".  Sign up information is provided on this After Visit Summary.  MyChart is used to connect with patients for Virtual Visits (Telemedicine).  Patients are able to view lab/test results, encounter notes, upcoming appointments, etc.  Non-urgent messages can be sent to your provider as well.    To learn more about what you can do with MyChart, go to [email or web address].    Your next appointment:   6 month(s)  Provider:   Little Ishikawa, MD     Other Instructions

## 2023-06-03 NOTE — Patient Instructions (Signed)
 Description   Continue taking warfarin 1 1/2 tablets daily except 1 tablet on Mondays, Wednesdays and Fridays. Recheck INR in 6 weeks.  Stay consistent with greens (2 per week).  Cou[facility]in Clinic [phone]

## 2023-06-04 ENCOUNTER — Ambulatory Visit (HOSPITAL_COMMUNITY): Payer: Medicare Other | Attending: Cardiology

## 2023-06-04 DIAGNOSIS — L820 Inflamed seborrheic keratosis: Secondary | ICD-10-CM | POA: Diagnosis not present

## 2023-06-04 DIAGNOSIS — L814 Other melanin hyperpigmentation: Secondary | ICD-10-CM | POA: Diagnosis not present

## 2023-06-04 DIAGNOSIS — Z86006 Personal history of melanoma in-situ: Secondary | ICD-10-CM | POA: Diagnosis not present

## 2023-06-04 DIAGNOSIS — I340 Nonrheumatic mitral (valve) insufficiency: Secondary | ICD-10-CM | POA: Diagnosis not present

## 2023-06-04 DIAGNOSIS — Z08 Encounter for follow-up examination after completed treatment for malignant neoplasm: Secondary | ICD-10-CM | POA: Diagnosis not present

## 2023-06-04 DIAGNOSIS — L2989 Other pruritus: Secondary | ICD-10-CM | POA: Diagnosis not present

## 2023-06-04 DIAGNOSIS — L570 Actinic keratosis: Secondary | ICD-10-CM | POA: Diagnosis not present

## 2023-06-04 DIAGNOSIS — D225 Melanocytic nevi of trunk: Secondary | ICD-10-CM | POA: Diagnosis not present

## 2023-06-04 DIAGNOSIS — L821 Other seborrheic keratosis: Secondary | ICD-10-CM | POA: Diagnosis not present

## 2023-06-04 DIAGNOSIS — L538 Other specified erythematous conditions: Secondary | ICD-10-CM | POA: Diagnosis not present

## 2023-06-04 LAB — ECHOCARDIOGRAM COMPLETE
AR max vel: 2.37 cm2
AV Area VTI: 2.36 cm2
AV Area mean vel: 2.29 cm2
AV Mean grad: 4 mm[Hg]
AV Peak grad: 6.8 mm[Hg]
Ao pk vel: 1.31 m/s
S' Lateral: 2.8 cm

## 2023-06-06 ENCOUNTER — Encounter: Payer: Self-pay | Admitting: Cardiology

## 2023-06-22 DIAGNOSIS — N390 Urinary tract infection, site not specified: Secondary | ICD-10-CM | POA: Diagnosis not present

## 2023-06-22 DIAGNOSIS — N399 Disorder of urinary system, unspecified: Secondary | ICD-10-CM | POA: Diagnosis not present

## 2023-06-22 DIAGNOSIS — B999 Unspecified infectious disease: Secondary | ICD-10-CM | POA: Diagnosis not present

## 2023-06-22 DIAGNOSIS — I10 Essential (primary) hypertension: Secondary | ICD-10-CM | POA: Diagnosis not present

## 2023-06-22 DIAGNOSIS — Z880 Allergy status to penicillin: Secondary | ICD-10-CM | POA: Diagnosis not present

## 2023-06-23 ENCOUNTER — Telehealth: Payer: Self-pay | Admitting: *Deleted

## 2023-06-23 NOTE — Telephone Encounter (Signed)
 Pt called since she is in [place] with [place] and she has been diagnosed with a uti. She states she went to a clinic there and prescribed cipro 500mg  and her INR was 2.7 per then checking it for her. She states the MD told her it does not interact with warfarin. Advised that cipro DOES interact with warfarin. She is aware that if she is going to take the cipro she will need the INR checked no later than Thursday and the MD should have her adjust her warfarin dose with the INR result or the MD will need to fax [place] the result so we can address accordingly. She states she will give them a call and see what could be done and update [place] as needed. She is not coming home until Sunday.

## 2023-06-25 ENCOUNTER — Other Ambulatory Visit (HOSPITAL_COMMUNITY): Payer: Medicare Other

## 2023-06-25 DIAGNOSIS — I509 Heart failure, unspecified: Secondary | ICD-10-CM | POA: Diagnosis not present

## 2023-06-25 DIAGNOSIS — I50811 Acute right heart failure: Secondary | ICD-10-CM | POA: Diagnosis not present

## 2023-06-25 DIAGNOSIS — N179 Acute kidney failure, unspecified: Secondary | ICD-10-CM | POA: Diagnosis not present

## 2023-06-25 DIAGNOSIS — G44221 Chronic tension-type headache, intractable: Secondary | ICD-10-CM | POA: Diagnosis not present

## 2023-06-25 DIAGNOSIS — R42 Dizziness and giddiness: Secondary | ICD-10-CM | POA: Diagnosis not present

## 2023-06-25 DIAGNOSIS — I4891 Unspecified atrial fibrillation: Secondary | ICD-10-CM | POA: Diagnosis not present

## 2023-06-25 DIAGNOSIS — I110 Hypertensive heart disease with heart failure: Secondary | ICD-10-CM | POA: Diagnosis not present

## 2023-06-25 DIAGNOSIS — N390 Urinary tract infection, site not specified: Secondary | ICD-10-CM | POA: Diagnosis not present

## 2023-06-25 DIAGNOSIS — I5033 Acute on chronic diastolic (congestive) heart failure: Secondary | ICD-10-CM | POA: Diagnosis not present

## 2023-06-25 DIAGNOSIS — R001 Bradycardia, unspecified: Secondary | ICD-10-CM | POA: Diagnosis not present

## 2023-06-25 DIAGNOSIS — R079 Chest pain, unspecified: Secondary | ICD-10-CM | POA: Diagnosis not present

## 2023-06-25 DIAGNOSIS — Z7901 Long term (current) use of anticoagulants: Secondary | ICD-10-CM | POA: Diagnosis not present

## 2023-06-25 DIAGNOSIS — I5030 Unspecified diastolic (congestive) heart failure: Secondary | ICD-10-CM | POA: Diagnosis not present

## 2023-06-25 DIAGNOSIS — I5041 Acute combined systolic (congestive) and diastolic (congestive) heart failure: Secondary | ICD-10-CM | POA: Diagnosis not present

## 2023-06-25 DIAGNOSIS — I951 Orthostatic hypotension: Secondary | ICD-10-CM | POA: Diagnosis not present

## 2023-06-25 DIAGNOSIS — R531 Weakness: Secondary | ICD-10-CM | POA: Diagnosis not present

## 2023-06-25 DIAGNOSIS — Z950 Presence of cardiac pacemaker: Secondary | ICD-10-CM | POA: Diagnosis not present

## 2023-06-25 DIAGNOSIS — T887XXA Unspecified adverse effect of drug or medicament, initial encounter: Secondary | ICD-10-CM | POA: Diagnosis not present

## 2023-06-25 DIAGNOSIS — I340 Nonrheumatic mitral (valve) insufficiency: Secondary | ICD-10-CM | POA: Diagnosis not present

## 2023-06-25 DIAGNOSIS — I959 Hypotension, unspecified: Secondary | ICD-10-CM | POA: Diagnosis not present

## 2023-06-26 DIAGNOSIS — I110 Hypertensive heart disease with heart failure: Secondary | ICD-10-CM | POA: Diagnosis not present

## 2023-06-26 DIAGNOSIS — I509 Heart failure, unspecified: Secondary | ICD-10-CM | POA: Diagnosis not present

## 2023-06-26 DIAGNOSIS — N179 Acute kidney failure, unspecified: Secondary | ICD-10-CM | POA: Diagnosis not present

## 2023-06-26 DIAGNOSIS — I4891 Unspecified atrial fibrillation: Secondary | ICD-10-CM | POA: Diagnosis not present

## 2023-06-26 DIAGNOSIS — Z7901 Long term (current) use of anticoagulants: Secondary | ICD-10-CM | POA: Diagnosis not present

## 2023-06-26 DIAGNOSIS — Z950 Presence of cardiac pacemaker: Secondary | ICD-10-CM | POA: Diagnosis not present

## 2023-06-26 DIAGNOSIS — I340 Nonrheumatic mitral (valve) insufficiency: Secondary | ICD-10-CM | POA: Diagnosis not present

## 2023-06-26 DIAGNOSIS — I5030 Unspecified diastolic (congestive) heart failure: Secondary | ICD-10-CM | POA: Diagnosis not present

## 2023-06-26 DIAGNOSIS — I50811 Acute right heart failure: Secondary | ICD-10-CM | POA: Diagnosis not present

## 2023-06-27 DIAGNOSIS — I5041 Acute combined systolic (congestive) and diastolic (congestive) heart failure: Secondary | ICD-10-CM | POA: Diagnosis not present

## 2023-06-29 DIAGNOSIS — I509 Heart failure, unspecified: Secondary | ICD-10-CM | POA: Diagnosis not present

## 2023-06-29 DIAGNOSIS — R001 Bradycardia, unspecified: Secondary | ICD-10-CM | POA: Diagnosis not present

## 2023-06-29 DIAGNOSIS — I959 Hypotension, unspecified: Secondary | ICD-10-CM | POA: Diagnosis not present

## 2023-06-29 DIAGNOSIS — N179 Acute kidney failure, unspecified: Secondary | ICD-10-CM | POA: Diagnosis not present

## 2023-06-29 DIAGNOSIS — N390 Urinary tract infection, site not specified: Secondary | ICD-10-CM | POA: Diagnosis not present

## 2023-06-29 DIAGNOSIS — R531 Weakness: Secondary | ICD-10-CM | POA: Diagnosis not present

## 2023-06-29 DIAGNOSIS — I110 Hypertensive heart disease with heart failure: Secondary | ICD-10-CM | POA: Diagnosis not present

## 2023-06-29 DIAGNOSIS — Z7901 Long term (current) use of anticoagulants: Secondary | ICD-10-CM | POA: Diagnosis not present

## 2023-06-29 DIAGNOSIS — I4891 Unspecified atrial fibrillation: Secondary | ICD-10-CM | POA: Diagnosis not present

## 2023-06-29 DIAGNOSIS — Z950 Presence of cardiac pacemaker: Secondary | ICD-10-CM | POA: Diagnosis not present

## 2023-06-29 DIAGNOSIS — T887XXA Unspecified adverse effect of drug or medicament, initial encounter: Secondary | ICD-10-CM | POA: Diagnosis not present

## 2023-06-29 DIAGNOSIS — I5030 Unspecified diastolic (congestive) heart failure: Secondary | ICD-10-CM | POA: Diagnosis not present

## 2023-06-29 DIAGNOSIS — I340 Nonrheumatic mitral (valve) insufficiency: Secondary | ICD-10-CM | POA: Diagnosis not present

## 2023-07-01 ENCOUNTER — Ambulatory Visit: Payer: Medicare Other

## 2023-07-01 ENCOUNTER — Other Ambulatory Visit: Payer: Self-pay

## 2023-07-01 MED ORDER — FLECAINIDE ACETATE 50 MG PO TABS: 50.0000 mg | ORAL_TABLET | Freq: Two times a day (BID) | ORAL | 2 refills | Status: DC

## 2023-07-02 ENCOUNTER — Ambulatory Visit: Attending: Cardiology | Admitting: Pharmacist

## 2023-07-02 DIAGNOSIS — Z5181 Encounter for therapeutic drug level monitoring: Secondary | ICD-10-CM | POA: Diagnosis not present

## 2023-07-02 DIAGNOSIS — I480 Paroxysmal atrial fibrillation: Secondary | ICD-10-CM | POA: Diagnosis not present

## 2023-07-02 LAB — POCT INR: INR: 3.2 — AB (ref 2.0–3.0)

## 2023-07-02 NOTE — Patient Instructions (Signed)
 Description   Take 1 tablet today and then continue taking warfarin 1 1/2 tablets daily except 1 tablet on Mondays, Wednesdays and Fridays. Recheck INR in 2 weeks.  Stay consistent with greens (2 per week).  Coumadin Clinic [phone]

## 2023-07-05 NOTE — Progress Notes (Unsigned)
 Cardiology Office Note Date:  07/05/2023  Patient ID:  Taegan, Haider 1946/01/29, MRN [id number] PCP:  Henrine Screws, MD  Cardiologist:  Dr. Katrinka Blazing (retired) > Dr. Shari Prows (inactive) Electrophysiologist: Dr. Johney Frame (inactive) >> Dr. Elberta Fortis      Chief Complaint:   f/u on flecainide re-start   History of Present Illness: Tresia Revolorio is a 78 y.o. female with history of anxiety, COPD, deaf (R ear), stroke, HTN, HLD, hypothyroidism, Afib, tachy-brady w/PPM.  I saw her 05/31/21 She is doing much better emotionally, on Wellbutrin and Zoloft  No CP, palpitations or cardiac awareness No SOB No near syncope or syncope. No bleeding or signs of bleeding Low arrhythmia burden No changes were made  She saw Dr. Elberta Fortis 07/01/22, reported recent increase in palpitations, not like her AFib, typically when active Low AF burden, Toprol increased  Saw Dr. Shari Prows last, 729/24, reported a 4 hour episode of Afib, palpitation burden not likely FAib given remote noting low AF burden, given PRN lopressor   Sept the pt reached out to office with increased palpitations, remote and d/w device clinic RN, suspected AFlutter w/RVR, 4.5 hours in duration, planned to come in for discussion  I saw her 02/11/23 She feels like in the last few months she is having more and more fast HRs. They do not feel exactly like her Afib felt, not as irregular, but quite fast. Uncomfortable feeling, no CP, but make her feel tired, SOB No clear triggers No dizzy spells near syncope or syncope. She feels like she has days that it is on/off over and over, though short lasting Others last several minutes When the RN told her was a 4 hour episode, she was surprised to hear that She has some orthopedic pains that limit her to some degree but of late she has slowed down because she is worried she will set her heart racing off Warfarin levels are steady, no bleeding or signs of bleeding She  arrives in a 1:1 tachycardia 115bpm via device rates 130's-150's with AFib/flutter episodes as well Resumed flecainide 50mg  BID with her metoprolol  I saw her 03/13/23 She is doing well, feels better, more energy She has used the PRN metoprolol on a couple occassions feeling some palpitations now and then. No CP, SOB No near syncope or syncope No bleeding or signs of bleeding She is achy all over, known to have OA, thought it may be the repatha but stopped it now a couple months and did not note any improvement.  Pending f/u with her PMD No FAib/AT Stable pacer and intervals No changes were made  She saw Dr. Bjorn Pippin 06/03/23, discussed OAC she preferred to stay with warfarin, reported some CP > planned for stress PET, myalgias continued off Repatha and recommended to resume. Planned to update her echo  Echo stable, LVEF 60-65%, no significant VHD  TODAY  *** flecainide w/metoprolol, EKG *** INRs, who follows, bleeding *** symptoms   Device information MDT dual chamber PPM, implanted 2018  AFib Hx diagnosis goes as far back as her Epic chart (2011)  AAD Hx Flecainide started 2018 BID eventually pill in pocket at an EP visit Jan 2021 pt states then taken off her list because she had not used it in years Flecainide restarted Nov 2024 with recurrent AT/AF   Past Medical History:  Diagnosis Date   Acoustic neuroma (HCC)    Hx of right ear acoustic neuroma, removed in 1999. No hearing in right ear.   Anxiety  Hx of, responds to Wellbutrin   Basal cell carcinoma    s/p MOHS surgery in 03/2011   Breast cancer (HCC) 2021   left lumpectomy   Cataract    Chronic anticoagulation 11/24/2016   Chronic cholecystitis with calculus 05/13/2013   Closed fracture of left lateral malleolus 08/10/2017   COPD (chronic obstructive pulmonary disease) (HCC)    Deafness in right ear    Embolic stroke (HCC) 11/24/2016   Family history of adverse reaction to anesthesia    Family hx of PONV    Fracture    left lateral malleolus fracture   GERD (gastroesophageal reflux disease)    Headache(784.0)    MIGRAINES   Hearing loss    Only in right ear, from an acoustic neuroma. S/P removal in 1999.    History of hiatal hernia    History of neck pain    Responds to Flexeril   History of shingles    Hypercholesteremia    Hypertension    Hypothyroidism    Melanoma (HCC) 2018   right arm-surgery only   PAF (paroxysmal atrial fibrillation) (HCC)    2 brief episodes of Afib recorded on monitor 02/2007   Paroxysmal atrial fibrillation (HCC)    PONV (postoperative nausea and vomiting)    Presence of permanent cardiac pacemaker 2018   for A-fib   Sick sinus syndrome (HCC) 12/19/2016   Sinus bradycardia    Tachycardia-bradycardia syndrome (HCC) 11/24/2016   TIA (transient ischemic attack) 2011   On coumadin. Asymptomatic, without recurrence.    Past Surgical History:  Procedure Laterality Date   ABDOMINAL HYSTERECTOMY     APPENDECTOMY     BREAST BIOPSY     X 3   BREAST CYST ASPIRATION Left 50 yrs ago per pt   BREAST EXCISIONAL BIOPSY Left 50 yrs ago   BREAST LUMPECTOMY WITH RADIOACTIVE SEED LOCALIZATION Left 06/21/2019   Procedure: LEFT BREAST LUMPECTOMY WITH RADIOACTIVE SEED LOCALIZATION;  Surgeon: Manus Rudd, MD;  Location:  SURGERY CENTER;  Service: General;  Laterality: Left;   CHOLECYSTECTOMY N/A 05/26/2013   Procedure: LAPAROSCOPIC CHOLECYSTECTOMY WITH INTRAOPERATIVE CHOLANGIOGRAM;  Surgeon: Wilmon Arms. Corliss Skains, MD;  Location: [facility] OR;  Service: General;  Laterality: N/A;   CRANIECTOMY FOR EXCISION OF ACOUSTIC NEUROMA Right 1999   INTRAOCULAR LENS INSERTION     Dr. Vonna Kotyk   MOHS SURGERY  03/2011   Basal cell skin cancer   ORIF ANKLE FRACTURE Left 08/10/2017   Procedure: OPEN REDUCTION INTERNAL FIXATION (ORIF) LEFT ANKLE FRACTURE;  Surgeon: Samson Frederic, MD;  Location: [facility] OR;  Service: Orthopedics;  Laterality: Left;   PACEMAKER IMPLANT N/A 12/19/2016    Medtronic Azure XT MRI conditional dual-chamber pacemaker for symptomatic sinus bradycardia by Dr Johney Frame   TONSILLECTOMY      Current Outpatient Medications  Medication Sig Dispense Refill   Cholecalciferol (VITAMIN D3 PO) Take by mouth.     clonazePAM (KLONOPIN) 0.5 MG tablet Take 0.5 mg by mouth daily.     Docusate Sodium (DSS) 100 MG CAPS Take 1 tablet by mouth as needed.     Evolocumab (REPATHA SURECLICK) 140 MG/ML SOAJ Inject 140 mg into the skin every 14 (fourteen) days. 2 mL 11   flecainide (TAMBOCOR) 50 MG tablet Take 1 tablet (50 mg total) by mouth 2 (two) times daily. 180 tablet 2   FLUoxetine (PROZAC) 10 MG capsule Take 10 mg by mouth daily.     fluticasone (CUTIVATE) 0.05 % cream as needed.     fluticasone (FLONASE) 50  MCG/ACT nasal spray Place 1 spray into both nostrils daily.     JUBLIA 10 % SOLN apply to affected nails qd     levothyroxine (SYNTHROID) 100 MCG tablet Take 100 mcg by mouth every morning.     loratadine (CLARITIN) 10 MG tablet Take 10 mg by mouth daily as needed for allergies.     metoprolol succinate (TOPROL-XL) 50 MG 24 hr tablet Take 1 tablet (50 mg total) by mouth daily. Take with or immediately following a meal. 90 tablet 3   metoprolol tartrate (LOPRESSOR) 25 MG tablet Take 0.5 tablets (12.5 mg total) by mouth 2 (two) times daily as needed (palpitations). 30 tablet 5   pantoprazole (PROTONIX) 40 MG tablet Take 40 mg by mouth 2 (two) times daily.     sucralfate (CARAFATE) 1 g tablet TAKE 1 TABLET BY MOUTH TWICE A DAY ON EMPTY STOMACH     vitamin B-12 (CYANOCOBALAMIN) 1000 MCG tablet 2 tablets     warfarin (COUMADIN) 2.5 MG tablet TAKE 1 AND 1/2 (ONE AND ONE HALF) TABLETS BY MOUTH DAILY AS DIRECTED BY COUMADIN CLINIC. 150 tablet 1   No current facility-administered medications for this visit.    Allergies:   Atorvastatin, Statins, Codeine, Ezetimibe, Latex, Penicillins, and Rosuvastatin   Social History:  The patient  reports that she has never smoked.  She has never used smokeless tobacco. She reports current alcohol use. She reports that she does not use drugs.   Family History:  The patient's family history includes Alcoholism in her father; Brain cancer in her maternal aunt; Breast cancer in her maternal aunt; CVA in an other family member; Cancer in her maternal grandmother; Diabetes in her mother; Healthy in her sister; Heart disease in an other family member; High Cholesterol in her mother; Hyperlipidemia in an other family member; Hypertension in her mother and another family member; Liver cancer in her father; Memory loss in her mother.  ROS:  Please see the history of present illness.    All other systems are reviewed and otherwise negative.   PHYSICAL EXAM:  VS:  There were no vitals taken for this visit. BMI: There is no height or weight on file to calculate BMI. Well nourished, well developed, in no acute distress HEENT: normocephalic, atraumatic Neck: no JVD, carotid bruits or masses Cardiac: *** RRR; no significant murmurs, no rubs, or gallops Lungs:  *** CTA b/l, no wheezing, rhonchi or rales Abd: soft, nontender MS: no deformity or Atrophy Ext: *** no edema Skin: warm and dry, no rash Neuro:  No gross deficits appreciated Psych: euthymic mood, full affect  *** PPM site is stable, no tethering or discomfort   EKG:  done today and reviewed by myself ***  03/13/23: AP/VS 65bpm, QRS 86ms, QTc  Device interrogation done today and reviewed by myself:  *** Battery and lead measurements are good ***  06/04/23: TTE 1. Left ventricular ejection fraction, by estimation, is 60 to 65%. The  left ventricle has normal function. The left ventricle has no regional  wall motion abnormalities. Left ventricular diastolic parameters are  indeterminate.   2. Right ventricular systolic function is normal. The right ventricular  size is normal. There is normal pulmonary artery systolic pressure.   3. Left atrial size was  severely dilated.   4. The mitral valve is normal in structure. Trivial mitral valve  regurgitation. No evidence of mitral stenosis.   5. The aortic valve is tricuspid. There is mild calcification of the  aortic valve.  There is mild thickening of the aortic valve. Aortic valve  regurgitation is trivial. Aortic valve sclerosis is present, with no  evidence of aortic valve stenosis. Aortic  valve area, by VTI measures 2.36 cm. Aortic valve mean gradient measures  4.0 mmHg. Aortic valve Vmax measures 1.31 m/s.   6. Aortic dilatation noted. There is mild dilatation of the ascending  aorta, measuring 42 mm.   7. The inferior vena cava is normal in size with greater than 50%  respiratory variability, suggesting right atrial pressure of 3 mmHg.     Myoview 11/13/21  Review of the above records today demonstrates:    The study is normal. The study is low risk.   No ST deviation was noted.   LV perfusion is normal. There is no evidence of ischemia. There is no evidence of infarction.   Left ventricular function is normal. Nuclear stress EF: 75 %. The left ventricular ejection fraction is hyperdynamic (>65%). End diastolic cavity size is normal. End systolic cavity size is normal.   Prior study available for comparison from 12/11/2016.    06/25/20; TTE 1. Left ventricular ejection fraction, by estimation, is 55 to 60%. The  left ventricle has normal function. The left ventricle has no regional  wall motion abnormalities. There is mild asymmetric left ventricular  hypertrophy of the basal and septal  segments. Left ventricular diastolic parameters are indeterminate. The  average left ventricular global longitudinal strain is -17.4 %.   2. Pacing wires in RA/RV . Right ventricular systolic function is normal.  The right ventricular size is normal. There is normal pulmonary artery  systolic pressure.   3. Left atrial size was moderately dilated.   4. The mitral valve is normal in structure.  Moderate mitral valve  regurgitation. No evidence of mitral stenosis.   5. The aortic valve was not well visualized. There is moderate  calcification of the aortic valve. There is moderate thickening of the  aortic valve. Aortic valve regurgitation is mild. Mild to moderate aortic  valve sclerosis/calcification is present,  without any evidence of aortic stenosis.   6. The inferior vena cava is normal in size with greater than 50%  respiratory variability, suggesting right atrial pressure of 3 mmHg.   12/11/2016: stress myoview Clinically and electrically negative for ischemia Normal perfusion. No ischemia or scar This is a low risk study. LVEF is 62%   12/02/2016: TTE Study Conclusions  - Left ventricle: The cavity size was normal. Wall thickness was    normal. Systolic function was normal. The estimated ejection    fraction was in the range of 55% to 60%. Wall motion was normal;    there were no regional wall motion abnormalities. Features are    consistent with a pseudonormal left ventricular filling pattern,    with concomitant abnormal relaxation and increased filling    pressure (grade 2 diastolic dysfunction).  - Aortic valve: There was no stenosis.  - Mitral valve: Mildly calcified annulus. There was mild    regurgitation.  - Left atrium: The atrium was mildly to moderately dilated.  - Right ventricle: The cavity size was normal. Systolic function    was normal.  - Right atrium: The atrium was mildly dilated.  - Tricuspid valve: Peak RV-RA gradient (S): 27 mm Hg.  - Pulmonary arteries: PA peak pressure: 30 mm Hg (S).  - Inferior vena cava: The vessel was normal in size. The    respirophasic diameter changes were in the normal range (>= 50%),  consistent with normal central venous pressure.   Impressions:  - Normal LV size with EF 55-60%. Moderate diastolic dysfunction.    Normal RV size and systolic function. Mild mitral regurgitation.     Recent Labs: No results  found for requested labs within last 365 days.  No results found for requested labs within last 365 days.   CrCl cannot be calculated (Patient's most recent lab result is older than the maximum 21 days allowed.).   Wt Readings from Last 3 Encounters:  06/03/23 148 lb (67.1 kg)  03/13/23 146 lb 3.2 oz (66.3 kg)  02/19/23 142 lb 4.8 oz (64.5 kg)     Other studies reviewed: Additional studies/records reviewed today include: summarized above  ASSESSMENT AND PLAN:  1. PPM     *** Intact function     *** No programming changes made  2. paroxysmal Afib, flutter     AT     CHA2DS2Vasc is 6,  On warfarin     ***%     Flecainide and *** metoprolol w/*** stable intervals  3. Secondary hypercoagulable state            Disposition: back in 76mo, sooner if needed     Current medicines are reviewed at length with the patient today.  The patient did not have any concerns regarding medicines.  Norma Fredrickson, PA-C 07/05/2023 9:32 AM     [facility] [address] [place] [address] [phone] (office)  [phone] (fax)

## 2023-07-06 ENCOUNTER — Encounter: Payer: Self-pay | Admitting: Physician Assistant

## 2023-07-06 ENCOUNTER — Ambulatory Visit: Payer: Medicare Other | Attending: Physician Assistant | Admitting: Physician Assistant

## 2023-07-06 VITALS — BP 124/80 | HR 65 | Ht 65.0 in | Wt 150.2 lb

## 2023-07-06 DIAGNOSIS — Z950 Presence of cardiac pacemaker: Secondary | ICD-10-CM | POA: Diagnosis not present

## 2023-07-06 DIAGNOSIS — I4892 Unspecified atrial flutter: Secondary | ICD-10-CM | POA: Insufficient documentation

## 2023-07-06 DIAGNOSIS — I4719 Other supraventricular tachycardia: Secondary | ICD-10-CM | POA: Diagnosis not present

## 2023-07-06 DIAGNOSIS — I480 Paroxysmal atrial fibrillation: Secondary | ICD-10-CM | POA: Diagnosis not present

## 2023-07-06 DIAGNOSIS — D6869 Other thrombophilia: Secondary | ICD-10-CM | POA: Insufficient documentation

## 2023-07-06 LAB — CUP PACEART INCLINIC DEVICE CHECK
Date Time Interrogation Session: 20250331180100
Implantable Lead Connection Status: 753985
Implantable Lead Connection Status: 753985
Implantable Lead Implant Date: 20180914
Implantable Lead Implant Date: 20180914
Implantable Lead Location: 753859
Implantable Lead Location: 753860
Implantable Lead Model: 5076
Implantable Lead Model: 5076
Implantable Pulse Generator Implant Date: 20180914
Lead Channel Pacing Threshold Amplitude: 0.75 V
Lead Channel Pacing Threshold Amplitude: 1 V
Lead Channel Pacing Threshold Amplitude: 1.5 V
Lead Channel Pacing Threshold Pulse Width: 0.4 ms
Lead Channel Pacing Threshold Pulse Width: 0.4 ms
Lead Channel Pacing Threshold Pulse Width: 0.8 ms
Lead Channel Sensing Intrinsic Amplitude: 13 mV

## 2023-07-06 MED ORDER — METOPROLOL TARTRATE 25 MG PO TABS: 12.5000 mg | ORAL_TABLET | Freq: Two times a day (BID) | ORAL | 1 refills | Status: AC | PRN

## 2023-07-06 MED ORDER — METOPROLOL SUCCINATE ER 50 MG PO TB24: 50.0000 mg | ORAL_TABLET | Freq: Every day | ORAL | 3 refills | Status: DC

## 2023-07-06 NOTE — Patient Instructions (Signed)
 Medication Instructions:   Your physician recommends that you continue on your current medications as directed. Please refer to the Current Medication list given to you today.   *If you need a refill on your cardiac medications before your next appointment, please call your pharmacy*   Lab Work: NONE ORDERED  TODAY    If you have labs (blood work) drawn today and your tests are completely normal, you will receive your results only by: MyChart Message (if you have MyChart) OR A paper copy in the mail If you have any lab test that is abnormal or we need to change your treatment, we will call you to review the results.   Testing/Procedures:  NONE ORDERED  TODAY     Follow-Up: At [facility], you and your health needs are our priority.  As part of our continuing mission to provide you with exceptional heart care, our providers are all part of one team.  This team includes your primary Cardiologist (physician) and Advanced Practice Providers or APPs (Physician Assistants and Nurse Practitioners) who all work together to provide you with the care you need, when you need it.  Your next appointment:   6 -8 week(s) ( CONTACT  CASSIE HALL/ ANGELINE HAMMER FOR EP SCHEDULING ISSUES )   Provider:   Loman Brooklyn, MD or Francis Dowse, PA-C   We recommend signing up for the patient portal called "MyChart".  Sign up information is provided on this After Visit Summary.  MyChart is used to connect with patients for Virtual Visits (Telemedicine).  Patients are able to view lab/test results, encounter notes, upcoming appointments, etc.  Non-urgent messages can be sent to your provider as well.   To learn more about what you can do with MyChart, go to [email or web address].   Other Instructions       1st Floor: - Lobby - Registration  - Pharmacy  - Lab - Cafe  2nd Floor: - PV Lab - Diagnostic Testing (echo, CT, nuclear med)  3rd Floor: - Vacant  4th Floor: - TCTS  (cardiothoracic surgery) - AFib Clinic - Structural Heart Clinic - Vascular Surgery  - Vascular Ultrasound  5th Floor: - HeartCare Cardiology (general and EP) - Clinical Pharmacy for coumadin, hypertension, lipid, weight-loss medications, and med management appointments    Valet parking services will be available as well.

## 2023-07-08 DIAGNOSIS — Z09 Encounter for follow-up examination after completed treatment for conditions other than malignant neoplasm: Secondary | ICD-10-CM | POA: Diagnosis not present

## 2023-07-08 DIAGNOSIS — I10 Essential (primary) hypertension: Secondary | ICD-10-CM | POA: Diagnosis not present

## 2023-07-08 DIAGNOSIS — H6121 Impacted cerumen, right ear: Secondary | ICD-10-CM | POA: Diagnosis not present

## 2023-07-08 DIAGNOSIS — Z87440 Personal history of urinary (tract) infections: Secondary | ICD-10-CM | POA: Diagnosis not present

## 2023-07-08 DIAGNOSIS — N3941 Urge incontinence: Secondary | ICD-10-CM | POA: Diagnosis not present

## 2023-07-08 DIAGNOSIS — I4891 Unspecified atrial fibrillation: Secondary | ICD-10-CM | POA: Diagnosis not present

## 2023-07-09 DIAGNOSIS — I4891 Unspecified atrial fibrillation: Secondary | ICD-10-CM | POA: Diagnosis not present

## 2023-07-09 DIAGNOSIS — J449 Chronic obstructive pulmonary disease, unspecified: Secondary | ICD-10-CM | POA: Diagnosis not present

## 2023-07-09 DIAGNOSIS — I10 Essential (primary) hypertension: Secondary | ICD-10-CM | POA: Diagnosis not present

## 2023-07-10 DIAGNOSIS — R300 Dysuria: Secondary | ICD-10-CM | POA: Diagnosis not present

## 2023-07-14 ENCOUNTER — Telehealth (HOSPITAL_COMMUNITY): Payer: Self-pay | Admitting: *Deleted

## 2023-07-14 NOTE — Telephone Encounter (Signed)
Patient returning call about her upcoming cardiac imaging study; pt verbalizes understanding of appt date/time, parking situation and where to check in, pre-test NPO status; name and call back number provided for further questions should they arise  Larey Brick RN Navigator Cardiac Imaging [facility] [phone] office [phone] cell  Patient aware to avoid caffeine 12 hours prior to her cardiac PET scan.

## 2023-07-14 NOTE — Telephone Encounter (Signed)
 Attempted to call patient regarding upcoming cardiac PET appointment. Left message on voicemail with name and callback number  Larey Brick RN Navigator Cardiac Imaging [facility] [phone] Office [phone] Cell  Reminder to avoid caffeine 12 hours prior to her cardiac PET scan.

## 2023-07-15 ENCOUNTER — Ambulatory Visit (HOSPITAL_COMMUNITY)
Admission: RE | Admit: 2023-07-15 | Discharge: 2023-07-15 | Disposition: A | Discharge status: Home / Self Care | Source: Ambulatory Visit | Attending: Cardiology | Admitting: Cardiology

## 2023-07-15 DIAGNOSIS — R079 Chest pain, unspecified: Secondary | ICD-10-CM | POA: Insufficient documentation

## 2023-07-15 LAB — NM PET CT CARDIAC PERFUSION MULTI W/ABSOLUTE BLOODFLOW
MBFR: 2.84
Nuc Rest EF: 21 %
Nuc Stress EF: 22 %
Rest MBF: 0.67 ml/g/min
Rest Nuclear Isotope Dose: 17.7 mCi
ST Depression (mm): 0 mm
Stress MBF: 1.9 ml/g/min
Stress Nuclear Isotope Dose: 17.7 mCi
TID: 1.03

## 2023-07-15 MED ORDER — REGADENOSON 0.4 MG/5ML IV SOLN
INTRAVENOUS | Status: AC
  Filled 2023-07-15: qty 5

## 2023-07-15 MED ORDER — RUBIDIUM RB82 GENERATOR (RUBYFILL)
17.7100 | PACK | Freq: Once | INTRAVENOUS | Status: AC
  Administered 2023-07-15: 17.71 via INTRAVENOUS

## 2023-07-15 MED ORDER — RUBIDIUM RB82 GENERATOR (RUBYFILL)
17.6700 | PACK | Freq: Once | INTRAVENOUS | Status: AC
  Administered 2023-07-15: 17.67 via INTRAVENOUS

## 2023-07-15 MED ORDER — REGADENOSON 0.4 MG/5ML IV SOLN
0.4000 mg | Freq: Once | INTRAVENOUS | Status: AC
  Administered 2023-07-15: 0.4 mg via INTRAVENOUS

## 2023-07-16 ENCOUNTER — Ambulatory Visit: Attending: Internal Medicine | Admitting: *Deleted

## 2023-07-16 DIAGNOSIS — Z5181 Encounter for therapeutic drug level monitoring: Secondary | ICD-10-CM | POA: Insufficient documentation

## 2023-07-16 DIAGNOSIS — I480 Paroxysmal atrial fibrillation: Secondary | ICD-10-CM | POA: Insufficient documentation

## 2023-07-16 LAB — POCT INR: POC INR: 4.6

## 2023-07-16 NOTE — Patient Instructions (Signed)
 Description   Hold warfarin today and tomorrow Then START taking warfarin 1 tablet daily except for 1.5 tablets on Sunday, Tuesday and Thursdays. Coumadin Clinic [phone]

## 2023-07-20 ENCOUNTER — Ambulatory Visit (INDEPENDENT_AMBULATORY_CARE_PROVIDER_SITE_OTHER): Payer: Medicare Other

## 2023-07-20 DIAGNOSIS — I480 Paroxysmal atrial fibrillation: Secondary | ICD-10-CM

## 2023-07-21 ENCOUNTER — Other Ambulatory Visit: Payer: Self-pay

## 2023-07-21 DIAGNOSIS — I519 Heart disease, unspecified: Secondary | ICD-10-CM

## 2023-07-21 DIAGNOSIS — I480 Paroxysmal atrial fibrillation: Secondary | ICD-10-CM

## 2023-07-21 LAB — CUP PACEART REMOTE DEVICE CHECK
Battery Remaining Longevity: 81 mo
Battery Voltage: 2.97 V
Brady Statistic AP VP Percent: 0.2 %
Brady Statistic AP VS Percent: 99.41 %
Brady Statistic AS VP Percent: 0.01 %
Brady Statistic AS VS Percent: 0.39 %
Brady Statistic RA Percent Paced: 99.66 %
Brady Statistic RV Percent Paced: 0.21 %
Date Time Interrogation Session: 20250414031802
Implantable Lead Connection Status: 753985
Implantable Lead Connection Status: 753985
Implantable Lead Implant Date: 20180914
Implantable Lead Implant Date: 20180914
Implantable Lead Location: 753859
Implantable Lead Location: 753860
Implantable Lead Model: 5076
Implantable Lead Model: 5076
Implantable Pulse Generator Implant Date: 20180914
Lead Channel Impedance Value: 304 Ohm
Lead Channel Impedance Value: 361 Ohm
Lead Channel Impedance Value: 418 Ohm
Lead Channel Impedance Value: 475 Ohm
Lead Channel Pacing Threshold Amplitude: 0.75 V
Lead Channel Pacing Threshold Amplitude: 1.125 V
Lead Channel Pacing Threshold Pulse Width: 0.4 ms
Lead Channel Pacing Threshold Pulse Width: 0.4 ms
Lead Channel Sensing Intrinsic Amplitude: 11.75 mV
Lead Channel Sensing Intrinsic Amplitude: 11.75 mV
Lead Channel Sensing Intrinsic Amplitude: 2.25 mV
Lead Channel Sensing Intrinsic Amplitude: 2.25 mV
Lead Channel Setting Pacing Amplitude: 1.5 V
Lead Channel Setting Pacing Amplitude: 2.5 V
Lead Channel Setting Pacing Pulse Width: 0.4 ms
Lead Channel Setting Sensing Sensitivity: 0.9 mV
Zone Setting Status: 755011
Zone Setting Status: 755011

## 2023-07-22 ENCOUNTER — Ambulatory Visit (HOSPITAL_COMMUNITY): Attending: Cardiology

## 2023-07-22 ENCOUNTER — Other Ambulatory Visit: Payer: Self-pay

## 2023-07-22 DIAGNOSIS — I519 Heart disease, unspecified: Secondary | ICD-10-CM | POA: Insufficient documentation

## 2023-07-22 DIAGNOSIS — I480 Paroxysmal atrial fibrillation: Secondary | ICD-10-CM | POA: Diagnosis not present

## 2023-07-22 LAB — ECHOCARDIOGRAM COMPLETE
Area-P 1/2: 3.79 cm2
MV M vel: 4 m/s
MV Peak grad: 64 mmHg
P 1/2 time: 727 ms
Radius: 0.65 cm
S' Lateral: 3 cm

## 2023-07-22 NOTE — Progress Notes (Signed)
 Per provider she wanted us  to remove from medication list. Please see mychart message 4/15

## 2023-07-23 ENCOUNTER — Telehealth: Payer: Self-pay | Admitting: *Deleted

## 2023-07-23 ENCOUNTER — Telehealth: Payer: Self-pay | Admitting: Cardiology

## 2023-07-23 NOTE — Telephone Encounter (Signed)
 Called and unable to reach patient, Left message for patient to call back and ask for Valier,

## 2023-07-23 NOTE — Telephone Encounter (Signed)
 Patient stated she is returning RN LaTonya's call.

## 2023-07-23 NOTE — Telephone Encounter (Signed)
 Patient returned RN's call.

## 2023-07-23 NOTE — Telephone Encounter (Signed)
 Patient returned called and verbalized Echo done yesterday and Flecainide stopped as ordered  per Dr. Armon Landry and appt. Scheduled 5/14 @ 3pm at [facility] office. Made patient aware to call office for any questions. Understanding verbalized.

## 2023-07-24 ENCOUNTER — Other Ambulatory Visit: Payer: Self-pay | Admitting: Cardiology

## 2023-07-24 DIAGNOSIS — I480 Paroxysmal atrial fibrillation: Secondary | ICD-10-CM

## 2023-07-24 NOTE — Telephone Encounter (Signed)
 Called and spoke to patient and made her aware of Dr. Alda Amas  recommendations to stop Flecainide , echo scheduled and appt scheduled at [facility] on 5/14 @3pm .  patient verbalized understanding

## 2023-07-27 ENCOUNTER — Ambulatory Visit: Attending: Cardiovascular Disease

## 2023-07-27 DIAGNOSIS — Z5181 Encounter for therapeutic drug level monitoring: Secondary | ICD-10-CM

## 2023-07-27 DIAGNOSIS — I480 Paroxysmal atrial fibrillation: Secondary | ICD-10-CM

## 2023-07-27 LAB — POCT INR: INR: 3.4 — AB (ref 2.0–3.0)

## 2023-07-27 NOTE — Patient Instructions (Signed)
 Take 0.5 tablet today only then Continue taking warfarin 1 tablet daily except for 1.5 tablets on Sunday, Tuesday and Thursdays. Coumadin  Clinic [phone] INR  in 3 weeks

## 2023-07-29 ENCOUNTER — Encounter: Payer: Self-pay | Admitting: *Deleted

## 2023-08-04 ENCOUNTER — Ambulatory Visit (HOSPITAL_COMMUNITY)
Admission: RE | Admit: 2023-08-04 | Discharge: 2023-08-04 | Disposition: A | Discharge status: Home / Self Care | Source: Ambulatory Visit | Attending: Internal Medicine | Admitting: Internal Medicine

## 2023-08-04 VITALS — BP 108/84 | HR 62 | Ht 65.0 in | Wt 147.0 lb

## 2023-08-04 DIAGNOSIS — Z7901 Long term (current) use of anticoagulants: Secondary | ICD-10-CM | POA: Diagnosis not present

## 2023-08-04 DIAGNOSIS — I4891 Unspecified atrial fibrillation: Secondary | ICD-10-CM | POA: Insufficient documentation

## 2023-08-04 DIAGNOSIS — I480 Paroxysmal atrial fibrillation: Secondary | ICD-10-CM | POA: Insufficient documentation

## 2023-08-04 DIAGNOSIS — Z5181 Encounter for therapeutic drug level monitoring: Secondary | ICD-10-CM | POA: Insufficient documentation

## 2023-08-04 MED ORDER — FLECAINIDE ACETATE 50 MG PO TABS: 50.0000 mg | ORAL_TABLET | Freq: Two times a day (BID) | ORAL | 6 refills | Status: AC

## 2023-08-04 MED ORDER — FLECAINIDE ACETATE 50 MG PO TABS: 50.0000 mg | ORAL_TABLET | Freq: Two times a day (BID) | ORAL | Status: DC

## 2023-08-04 NOTE — Progress Notes (Signed)
 Primary Care Physician: Ruven Coy, MD Primary Cardiologist: Wendie Hamburg, MD Electrophysiologist: Dr. Lawana Pray    Referring Physician: Mertha Abrahams, PA-C     [name] is a 78 y.o. female with a history of COPD, CVA, HTN, HLD, tachy-brady syndrome s/p PPM, mild ascending aortic dilation, and atrial fibrillation who presents for consultation in the [facility]. Device check by Dr. Lawana Pray on 4/15 showed no Afib since device check on 3/31 by Mertha Abrahams, PA-C. NM PET CT done on 4/9 read by Dr. Alda Amas showed concern for severely reduced LVEF. TTE on 4/16 showed normal LVEF. Patient is on coumadin  for a CHADS2VASC score of 6.  On evaluation today, she is currently in AV paced rhythm. Patient is relieved that her function is normal from echo on 4/16; she was very scared from the results of the PET CT. She does note to feel poorly when in Afib. She has noticed some palpitations since stopping the flecainide .   Today, she denies symptoms of chest pain, shortness of breath, orthopnea, PND, lower extremity edema, dizziness, presyncope, syncope, snoring, daytime somnolence, bleeding, or neurologic sequela. The patient is tolerating medications without difficulties and is otherwise without complaint today.    she has a BMI of Body mass index is 24.46 kg/m.[name] Filed Weights   08/04/23 1503  Weight: 66.7 kg    Current Outpatient Medications  Medication Sig Dispense Refill   Cholecalciferol (VITAMIN D3 PO) Take 1 tablet by mouth daily.     clonazePAM  (KLONOPIN ) 0.5 MG tablet Take 0.5 mg by mouth daily.     Evolocumab  (REPATHA  SURECLICK) 140 MG/ML SOAJ Inject 140 mg into the skin every 14 (fourteen) days. 2 mL 11   FLUoxetine  (PROZAC ) 10 MG capsule Take 10 mg by mouth daily.     fluticasone (CUTIVATE) 0.05 % cream Apply 1 Application topically as needed (for irritation).     fluticasone (FLONASE) 50 MCG/ACT nasal spray Place 1 spray into  both nostrils as needed.     levothyroxine  (SYNTHROID ) 100 MCG tablet Take 100 mcg by mouth every morning.     loratadine  (CLARITIN ) 10 MG tablet Take 10 mg by mouth daily as needed for allergies.     metoprolol  succinate (TOPROL -XL) 50 MG 24 hr tablet Take 1 tablet (50 mg total) by mouth daily. Take with or immediately following a meal. 90 tablet 3   metoprolol  tartrate (LOPRESSOR ) 25 MG tablet Take 0.5 tablets (12.5 mg total) by mouth 2 (two) times daily as needed (palpitations). 30 tablet 1   pantoprazole  (PROTONIX ) 40 MG tablet Take 40 mg by mouth 2 (two) times daily.     sucralfate (CARAFATE) 1 g tablet Sometimes will take extra when needed     vitamin B-12 (CYANOCOBALAMIN ) 1000 MCG tablet Take 1,000 mcg by mouth daily.     warfarin (COUMADIN ) 2.5 MG tablet TAKE 1 AND 1/2 (ONE AND ONE HALF) TABLETS BY MOUTH DAILY OR AS DIRECTED BY COUMADIN  CLINIC. 100 tablet 1   flecainide  (TAMBOCOR ) 50 MG tablet Take 1 tablet (50 mg total) by mouth 2 (two) times daily. 60 tablet 6   No current facility-administered medications for this encounter.    Atrial Fibrillation Management history:  Previous antiarrhythmic drugs: flecainide  Previous cardioversions: none Previous ablations: none Anticoagulation history: coumadin    ROS- All systems are reviewed and negative except as per the HPI above.  Physical Exam: BP 108/84   Pulse 62   Ht 5\' 5"  (1.651 m)   Wt 66.7  kg   BMI 24.46 kg/m   GEN: Well nourished, well developed in no acute distress NECK: No JVD; No carotid bruits CARDIAC: Regular rate and rhythm, no murmurs, rubs, gallops RESPIRATORY:  Clear to auscultation without rales, wheezing or rhonchi  ABDOMEN: Soft, non-tender, non-distended EXTREMITIES:  No edema; No deformity   EKG today demonstrates  Vent. rate 62 BPM PR interval 232 ms QRS duration 72 ms QT/QTcB 426/432 ms P-R-T axes * 56 -31 Atrial-paced rhythm with prolonged AV conduction   Echo 07/22/23 demonstrated  1. Left  ventricular ejection fraction, by estimation, is 60 to 65%. The  left ventricle has normal function. The left ventricle has no regional  wall motion abnormalities. Left ventricular diastolic parameters are  indeterminate.   2. Right ventricular systolic function is normal. The right ventricular  size is normal. There is mildly elevated pulmonary artery systolic  pressure.   3. Left atrial size was moderately dilated.   4. A small pericardial effusion is present. The pericardial effusion is  circumferential.   5. The mitral valve is normal in structure. Mild to moderate mitral valve  regurgitation. No evidence of mitral stenosis.   6. Tricuspid valve regurgitation is mild to moderate.   7. The aortic valve is tricuspid. There is mild calcification of the  aortic valve. There is mild thickening of the aortic valve. Aortic valve  regurgitation is mild. Aortic valve sclerosis/calcification is present,  without any evidence of aortic  stenosis.   8. Aortic dilatation noted. There is borderline dilatation of the  ascending aorta, measuring 39 mm.   9. The inferior vena cava is normal in size with greater than 50%  respiratory variability, suggesting right atrial pressure of 3 mmHg.   ASSESSMENT & PLAN CHA2DS2-VASc Score = 6  The patient's score is based upon: CHF History: 0 HTN History: 0 Diabetes History: 0 Stroke History: 2 Vascular Disease History: 1 Age Score: 2 Gender Score: 1       ASSESSMENT AND PLAN: Paroxysmal Atrial Fibrillation /flutter (ICD10:  I48.0) The patient's CHA2DS2-VASc score is 6, indicating a 9.7% annual risk of stroke.    She is currently in AV paced rhythm.   Discussion with cardiologist Dr. Alda Amas regarding discrepancy of LV function on recent studies. After discussion, conclusion that TTE is more reliable with function. For now, will restart flecainide  50 mg BID due to no systolic dysfunction confirmed on updated echo. If primary EP determines that  flecainide  should be discontinued in favor of another AAD, then can help arrange this transition. Otherwise, patient notes she was pretty happy with Afib control on flecainide .   Secondary Hypercoagulable State (ICD10:  D68.69) The patient is at significant risk for stroke/thromboembolism based upon her CHA2DS2-VASc Score of 6.  Continue Warfarin (Coumadin ).   Continue coumadin  as directed.     Follow up as scheduled with Dr. Lawana Pray.    Minnie Amber, PA-C  Afib Clinic [facility] [address] [place], [place] [address] [phone]

## 2023-08-04 NOTE — Patient Instructions (Addendum)
 Start Flecainide 50mg  twice a day

## 2023-08-05 DIAGNOSIS — I4891 Unspecified atrial fibrillation: Secondary | ICD-10-CM | POA: Diagnosis not present

## 2023-08-05 DIAGNOSIS — I10 Essential (primary) hypertension: Secondary | ICD-10-CM | POA: Diagnosis not present

## 2023-08-05 DIAGNOSIS — F418 Other specified anxiety disorders: Secondary | ICD-10-CM | POA: Diagnosis not present

## 2023-08-05 DIAGNOSIS — E039 Hypothyroidism, unspecified: Secondary | ICD-10-CM | POA: Diagnosis not present

## 2023-08-05 DIAGNOSIS — J449 Chronic obstructive pulmonary disease, unspecified: Secondary | ICD-10-CM | POA: Diagnosis not present

## 2023-08-07 DIAGNOSIS — I4891 Unspecified atrial fibrillation: Secondary | ICD-10-CM | POA: Diagnosis not present

## 2023-08-07 DIAGNOSIS — J449 Chronic obstructive pulmonary disease, unspecified: Secondary | ICD-10-CM | POA: Diagnosis not present

## 2023-08-07 DIAGNOSIS — I10 Essential (primary) hypertension: Secondary | ICD-10-CM | POA: Diagnosis not present

## 2023-08-12 ENCOUNTER — Ambulatory Visit: Admitting: Physician Assistant

## 2023-08-18 ENCOUNTER — Ambulatory Visit (INDEPENDENT_AMBULATORY_CARE_PROVIDER_SITE_OTHER): Admitting: *Deleted

## 2023-08-18 ENCOUNTER — Encounter: Payer: Self-pay | Admitting: Cardiology

## 2023-08-18 ENCOUNTER — Ambulatory Visit: Attending: Cardiology | Admitting: Cardiology

## 2023-08-18 VITALS — BP 100/60 | HR 65 | Ht 65.0 in | Wt 150.0 lb

## 2023-08-18 DIAGNOSIS — Z5181 Encounter for therapeutic drug level monitoring: Secondary | ICD-10-CM | POA: Insufficient documentation

## 2023-08-18 DIAGNOSIS — D6869 Other thrombophilia: Secondary | ICD-10-CM | POA: Insufficient documentation

## 2023-08-18 DIAGNOSIS — I495 Sick sinus syndrome: Secondary | ICD-10-CM | POA: Diagnosis not present

## 2023-08-18 DIAGNOSIS — I480 Paroxysmal atrial fibrillation: Secondary | ICD-10-CM | POA: Diagnosis not present

## 2023-08-18 LAB — CUP PACEART INCLINIC DEVICE CHECK
Date Time Interrogation Session: 20250513170522
Implantable Lead Connection Status: 753985
Implantable Lead Connection Status: 753985
Implantable Lead Implant Date: 20180914
Implantable Lead Implant Date: 20180914
Implantable Lead Location: 753859
Implantable Lead Location: 753860
Implantable Lead Model: 5076
Implantable Lead Model: 5076
Implantable Pulse Generator Implant Date: 20180914

## 2023-08-18 LAB — POCT INR: INR: 3.9 — AB (ref 2.0–3.0)

## 2023-08-18 NOTE — Patient Instructions (Signed)
 Description   PLEASE CONFIRM YOUR DOSE WHEN GET HOME AND CALL BACK IF NEEDED.  Do no take any warfarin today then Continue taking warfarin 1 tablet daily except for 1.5 tablets on Sunday, Tuesday and Thursdays. Coumadin  Clinic [phone] Recheck INR in 3 weeks

## 2023-08-18 NOTE — Patient Instructions (Addendum)
 M  Follow-Up: At [facility], you and your health needs are our priority.  As part of our continuing mission to provide you with exceptional heart care, our providers are all part of one team.  This team includes your primary Cardiologist (physician) and Advanced Practice Providers or APPs (Physician Assistants and Nurse Practitioners) who all work together to provide you with the care you need, when you need it.  Your next appointment:   6 month(s)  Provider:   You will follow up in the Atrial Fibrillation Clinic located at [facility]. Your provider will be: Clint R. Fenton, PA-C or Minnie Amber, PA-C

## 2023-08-18 NOTE — Progress Notes (Signed)
  Electrophysiology Office Note:   Date:  08/18/2023  ID:  [name], DOB [birth year], MRN [id number]  Primary Cardiologist: Wendie Hamburg, MD Primary Heart Failure: None Electrophysiologist: Ovetta Bazzano Cortland Ding, MD      History of Present Illness:   [name] is a 78 y.o. female with h/o COPD, CVA, hypertension, hyperlipidemia, atrial fibrillation, tachybradycardia syndrome  seen today for routine electrophysiology followup.   Since last being seen in our clinic the patient reports doing well.  She has no chest pain or shortness of breath.  She is able to do all of her daily activities.  She had an illness on a trip to [place] where she had significant diarrhea and nausea.  She had some SVT during that time but her flecainide  was stopped.  Since being on the flecainide , she has done quite well without issue.  she denies chest pain, palpitations, dyspnea, PND, orthopnea, nausea, vomiting, dizziness, syncope, edema, weight gain, or early satiety.   Review of systems complete and found to be negative unless listed in HPI.      EP Information / Studies Reviewed:    EKG is ordered today. Personal review as below.  EKG Interpretation Date/Time:  Tuesday Aug 18 2023 14:28:25 EDT Ventricular Rate:  65 PR Interval:  272 QRS Duration:  88 QT Interval:  432 QTC Calculation: 449 R Axis:   23  Text Interpretation: Atrial-paced rhythm with prolonged AV conduction Nonspecific T wave abnormality When compared with ECG of 04-Aug-2023 15:13, No significant change was found Confirmed by Alvetta Hidrogo ([id number]) on 08/18/2023 2:48:41 PM   PPM Interrogation-  reviewed in detail today,  See PACEART report.  Device History: Medtronic Dual Chamber PPM implanted 2018 for Tachy-Brady syndrome  Risk Assessment/Calculations:    CHA2DS2-VASc Score = 6   This indicates a 9.7% annual risk of stroke. The patient's score is based upon: CHF History: 0 HTN History:  0 Diabetes History: 0 Stroke History: 2 Vascular Disease History: 1 Age Score: 2 Gender Score: 1             Physical Exam:   VS:  BP 100/60 (BP Location: Left Arm, Patient Position: Sitting, Cuff Size: Normal)   Pulse 65   Ht 5\' 5"  (1.651 m)   Wt 150 lb (68 kg)   SpO2 95%   BMI 24.96 kg/m    Wt Readings from Last 3 Encounters:  08/18/23 150 lb (68 kg)  08/04/23 147 lb (66.7 kg)  07/06/23 150 lb 3.2 oz (68.1 kg)     GEN: Well nourished, well developed in no acute distress NECK: No JVD; No carotid bruits CARDIAC: Regular rate and rhythm, no murmurs, rubs, gallops RESPIRATORY:  Clear to auscultation without rales, wheezing or rhonchi  ABDOMEN: Soft, non-tender, non-distended EXTREMITIES:  No edema; No deformity   ASSESSMENT AND PLAN:    Tachy-Brady syndrome s/p Medtronic PPM  Normal PPM function Sensing, threshold, impedance within normal limits Programming appropriate See Pace Art report No changes today  2.  Paroxysmal atrial fibrillation: On Toprol -XL and flecainide .  Remains in normal rhythm.  No further episodes of atrial fibrillation.  Would continue with current management.  3.  Secondary hypercoagulable state: On warfarin for atrial fibrillation  Disposition:   Follow up with Afib Clinic in 6 months  Signed, Princeston Blizzard Cortland Ding, MD

## 2023-08-19 ENCOUNTER — Ambulatory Visit (HOSPITAL_BASED_OUTPATIENT_CLINIC_OR_DEPARTMENT_OTHER): Admitting: Cardiology

## 2023-08-19 ENCOUNTER — Other Ambulatory Visit (HOSPITAL_COMMUNITY): Payer: Medicare Other

## 2023-08-19 VITALS — BP 98/68 | HR 83 | Ht 65.0 in | Wt 150.0 lb

## 2023-08-19 DIAGNOSIS — I480 Paroxysmal atrial fibrillation: Secondary | ICD-10-CM | POA: Diagnosis not present

## 2023-08-19 DIAGNOSIS — I340 Nonrheumatic mitral (valve) insufficiency: Secondary | ICD-10-CM

## 2023-08-19 DIAGNOSIS — I495 Sick sinus syndrome: Secondary | ICD-10-CM

## 2023-08-19 DIAGNOSIS — R079 Chest pain, unspecified: Secondary | ICD-10-CM | POA: Diagnosis not present

## 2023-08-19 NOTE — Patient Instructions (Signed)
 Medication Instructions:  Your physician recommends that you continue on your current medications as directed. Please refer to the Current Medication list given to you today.   Follow-Up: Your next appointment:   6 month(s)  Provider:   Wendie Hamburg, MD

## 2023-08-19 NOTE — Progress Notes (Signed)
 Cardiology Office Note:   Date:  08/19/2023  ID:  [name], DOB 04/27/1945, MRN [id number]  History of Present Illness:   [name] is a 78 y.o. female with history of COPD, CVA, HTN, HLD, tachy-brady syndrome s/p PPM placement, Afib, mild ascending aortic dilation who presents for follow-up.  She previously followed with Dr. Ardell Beauvais, last seen 11/03/2022.    Myoview  11/2021 normal and low risk. TTE 06/2020 with LVEF 55-60%, normal RV, moderate LAE, moderate MR, mild AR.  Reported chest pain and underwent stress PET 07/2023 which showed normal perfusion and myocardial blood flow reserve, mild coronary calcifications, LVEF 21%.  Underwent echocardiogram 07/2023 which showed LVEF 60 to 65%, normal RV function, moderate left atrial enlargement, small pericardial effusion, mild to moderate mitral regurgitation, mild to moderate tricuspid regurgitation, mild aortic valve regurgitation.  Since last clinic visit, she reports she is doing okay.  She continues to have some chest pain and dyspnea but has improved. Denies any lightheadedness, syncope, lower extremity edema, or palpitations.  She is on warfarin but denies any bleeding issues.  Past Medical History:  Diagnosis Date   Acoustic neuroma (HCC)    Hx of right ear acoustic neuroma, removed in 1999. No hearing in right ear.   Anxiety    Hx of, responds to Wellbutrin   Basal cell carcinoma    s/p MOHS surgery in 03/2011   Breast cancer (HCC) 2021   left lumpectomy   Cataract    Chronic anticoagulation 11/24/2016   Chronic cholecystitis with calculus 05/13/2013   Closed fracture of left lateral malleolus 08/10/2017   COPD (chronic obstructive pulmonary disease) (HCC)    Deafness in right ear    Embolic stroke (HCC) 11/24/2016   Family history of adverse reaction to anesthesia    Family hx of PONV   Fracture    left lateral malleolus fracture   GERD (gastroesophageal reflux disease)    Headache(784.0)     MIGRAINES   Hearing loss    Only in right ear, from an acoustic neuroma. S/P removal in 1999.    History of hiatal hernia    History of neck pain    Responds to Flexeril   History of shingles    Hypercholesteremia    Hypertension    Hypothyroidism    Melanoma (HCC) 2018   right arm-surgery only   PAF (paroxysmal atrial fibrillation) (HCC)    2 brief episodes of Afib recorded on monitor 02/2007   Paroxysmal atrial fibrillation (HCC)    PONV (postoperative nausea and vomiting)    Presence of permanent cardiac pacemaker 2018   for A-fib   Sick sinus syndrome (HCC) 12/19/2016   Sinus bradycardia    Tachycardia-bradycardia syndrome (HCC) 11/24/2016   TIA (transient ischemic attack) 2011   On coumadin . Asymptomatic, without recurrence.     ROS: As per HPI  Studies Reviewed:    EKG:  No new tracing today  Cardiac Studies & Procedures   ______________________________________________________________________________________________   STRESS TESTS  NM PET CT CARDIAC PERFUSION MULTI W/ABSOLUTE BLOODFLOW 07/15/2023  Narrative   Normal perfusion and normal myocardial blood flow reserve.  Mild coronary calcifications on CT.  However, LVEF is severely reduced (21% at rest, 22% with stress).  Study suggests nonischemic cardiomyopathy, and is high risk due to severe systolic dysfunction   LV perfusion is normal. There is no evidence of ischemia. There is no evidence of infarction.   Rest left ventricular function is abnormal. Rest global function is severely reduced.  Rest EF: 21%. Stress left ventricular function is abnormal. Stress global function is severely reduced. Stress EF: 22%. End diastolic cavity size is normal. End systolic cavity size is normal.   Coronary calcium  was present on the attenuation correction CT images. Mild coronary calcifications were present. Coronary calcifications were present in the left anterior descending artery distribution(s).   Findings are consistent with  no ischemia. The study is high risk.   Electronically signed by Carson Clara, MD  EXAM: OVER-READ INTERPRETATION  CT CHEST  The following report is a limited chest CT over-read performed by radiologist Dr. Marcos Sevin [facility] on 07/15/2023. This over-read does not include interpretation of cardiac or coronary anatomy or pathology nor does it include evaluation of the PET data. The cardiac PET-CT interpretation by the cardiologist is attached.  COMPARISON:  Chest CT 10/30/2022  FINDINGS: Mediastinum/Nodes: No enlarged lymph nodes within the visualized mediastinum.Cardiac pacemaker and aortic atherosclerosis noted.  Lungs/Pleura: Mild nonspecific right pleural thickening. No significant pleural effusion. Mild dependent atelectasis and central airway thickening within both visualized lungs.  Upper abdomen: No significant findings in the visualized upper abdomen.  Musculoskeletal/Chest wall: No chest wall mass or suspicious osseous findings within the visualized chest.  IMPRESSION: No significant extracardiac findings within the visualized chest.   Electronically Signed By: Elmon Hagedorn M.D. On: 07/15/2023 11:24   ECHOCARDIOGRAM  ECHOCARDIOGRAM COMPLETE 07/22/2023  Narrative ECHOCARDIOGRAM REPORT    Patient Name:   [name] Date of Exam: 07/22/2023 Medical Rec #:  [id number]                Height:       65.0 in Accession #:    [id number]               Weight:       150.2 lb Date of Birth:  1945-05-18                BSA:          1.751 m Patient Age:    78 years                 BP:           105/79 mmHg Patient Gender: F                        HR:           67 bpm. Exam Location:  [address]  Procedure: 2D Echo, Cardiac Doppler and Color Doppler (Both Spectral and Color Flow Doppler were utilized during procedure).  Indications:    I51.9 Systolic Dysfunction  History:        Patient has prior history of  Echocardiogram examinations, most recent 06/04/2023. Pacemaker, Stroke, Arrythmias:SSS, Tachycardia, Bradycardia and Atrial Fibrillation; Risk Factors:Hypertension.  Sonographer:    Juventino Oppenheim RCS Referring Phys: [id number] Sakai Heinle L Damontre Millea  IMPRESSIONS   1. Left ventricular ejection fraction, by estimation, is 60 to 65%. The left ventricle has normal function. The left ventricle has no regional wall motion abnormalities. Left ventricular diastolic parameters are indeterminate. 2. Right ventricular systolic function is normal. The right ventricular size is normal. There is mildly elevated pulmonary artery systolic pressure. 3. Left atrial size was moderately dilated. 4. A small pericardial effusion is present. The pericardial effusion is circumferential. 5. The mitral valve is normal in structure. Mild to moderate mitral valve regurgitation. No evidence of mitral stenosis. 6. Tricuspid valve regurgitation is mild to moderate.  7. The aortic valve is tricuspid. There is mild calcification of the aortic valve. There is mild thickening of the aortic valve. Aortic valve regurgitation is mild. Aortic valve sclerosis/calcification is present, without any evidence of aortic stenosis. 8. Aortic dilatation noted. There is borderline dilatation of the ascending aorta, measuring 39 mm. 9. The inferior vena cava is normal in size with greater than 50% respiratory variability, suggesting right atrial pressure of 3 mmHg.  Comparison(s): Prior images reviewed side by side. Changes from prior study are noted.  FINDINGS Left Ventricle: Left ventricular ejection fraction, by estimation, is 60 to 65%. The left ventricle has normal function. The left ventricle has no regional wall motion abnormalities. The left ventricular internal cavity size was normal in size. There is no left ventricular hypertrophy. Left ventricular diastolic parameters are indeterminate.  Right Ventricle: The right ventricular size  is normal. Right vetricular wall thickness was not well visualized. Right ventricular systolic function is normal. There is mildly elevated pulmonary artery systolic pressure. The tricuspid regurgitant velocity is 3.24 m/s, and with an assumed right atrial pressure of 3 mmHg, the estimated right ventricular systolic pressure is 45.0 mmHg.  Left Atrium: Left atrial size was moderately dilated.  Right Atrium: Right atrial size was normal in size.  Pericardium: A small pericardial effusion is present. The pericardial effusion is circumferential.  Mitral Valve: The mitral valve is normal in structure. Mild to moderate mitral valve regurgitation. No evidence of mitral valve stenosis.  Tricuspid Valve: The tricuspid valve is normal in structure. Tricuspid valve regurgitation is mild to moderate. No evidence of tricuspid stenosis.  Aortic Valve: The aortic valve is tricuspid. There is mild calcification of the aortic valve. There is mild thickening of the aortic valve. Aortic valve regurgitation is mild. Aortic regurgitation PHT measures 727 msec. Aortic valve sclerosis/calcification is present, without any evidence of aortic stenosis.  Pulmonic Valve: The pulmonic valve was not well visualized. Pulmonic valve regurgitation is not visualized. No evidence of pulmonic stenosis.  Aorta: Aortic dilatation noted. There is borderline dilatation of the ascending aorta, measuring 39 mm.  Venous: The inferior vena cava is normal in size with greater than 50% respiratory variability, suggesting right atrial pressure of 3 mmHg.  IAS/Shunts: The atrial septum is grossly normal.  Additional Comments: A device lead is visualized in the right atrium and right ventricle.   LEFT VENTRICLE PLAX 2D LVIDd:         4.50 cm   Diastology LVIDs:         3.00 cm   LV e' medial:    0.09 cm/s LV PW:         1.00 cm   LV E/e' medial:  11.5 LV IVS:        0.80 cm   LV e' lateral:   0.12 cm/s LVOT diam:     2.10 cm   LV  E/e' lateral: 8.8 LV SV:         58 LV SV Index:   33 LVOT Area:     3.46 cm   RIGHT VENTRICLE RV Basal diam:  3.40 cm RV S prime:     12.00 cm/s TAPSE (M-mode): 2.0 cm RVSP:           45.0 mmHg  LEFT ATRIUM             Index        RIGHT ATRIUM           Index LA diam:  5.00 cm 2.85 cm/m   RA Pressure: 3.00 mmHg LA Vol (A2C):   70.5 ml 40.25 ml/m  RA Area:     15.40 cm LA Vol (A4C):   52.7 ml 30.09 ml/m  RA Volume:   42.60 ml  24.32 ml/m LA Biplane Vol: 61.6 ml 35.17 ml/m AORTIC VALVE LVOT Vmax:   77.70 cm/s LVOT Vmean:  53.275 cm/s LVOT VTI:    0.169 m AI PHT:      727 msec  AORTA Ao Root diam: 3.20 cm Ao Asc diam:  3.90 cm  MITRAL VALVE                  TRICUSPID VALVE MV Area (PHT):                TR Peak grad:   42.0 mmHg MV Decel Time:                TR Vmax:        324.00 cm/s MR Peak grad:    64.0 mmHg    Estimated RAP:  3.00 mmHg MR Mean grad:    48.0 mmHg    RVSP:           45.0 mmHg MR Vmax:         400.00 cm/s MR Vmean:        328.0 cm/s   SHUNTS MR PISA:         2.65 cm     Systemic VTI:  0.17 m MR PISA Eff ROA: 20 mm       Systemic Diam: 2.10 cm MR PISA Radius:  0.65 cm MV E velocity: 1.06 cm/s  Sheryle Donning MD Electronically signed by Sheryle Donning MD Signature Date/Time: 07/22/2023/8:48:26 PM    Final    MONITORS  CARDIAC EVENT MONITOR 08/08/2014       ______________________________________________________________________________________________       Risk Assessment/Calculations:    CHA2DS2-VASc Score = 6  This indicates a 9.7% annual risk of stroke. The patient's score is based upon: CHF History: 0 HTN History: 0 Diabetes History: 0 Stroke History: 2 Vascular Disease History: 1 Age Score: 2 Gender Score: 1           Physical Exam:   VS:  BP 98/68   Pulse 83   Ht 5\' 5"  (1.651 m)   Wt 150 lb (68 kg)   SpO2 96%   BMI 24.96 kg/m    Wt Readings from Last 3 Encounters:  08/19/23 150 lb (68 kg)   08/18/23 150 lb (68 kg)  08/04/23 147 lb (66.7 kg)     GEN: Well nourished, well developed in no acute distress NECK: No JVD; No carotid bruits CARDIAC: RRR, no murmurs, rubs, gallops RESPIRATORY:  Clear to auscultation without rales, wheezing or rhonchi  ABDOMEN: Soft, non-tender, non-distended EXTREMITIES:  No edema; No deformity   ASSESSMENT AND PLAN:   #Paroxsymal Afib: -CHADs-vasc 6 -Continue metop 50mg  XL daily.  Also on metoprolol  12.5mg  BID prn for palpitations -Started on flecainide  by EP 02/2023, reports palpitations significantly improved since starting flecainide  -Continue warfarin for [facility].  Discussed switching to DOAC but her preference is to stay on warfarin  #Chest pain -Reported chest pain and underwent stress PET 07/2023 which showed normal perfusion and myocardial blood flow reserve, mild coronary calcifications, LVEF 21%.  Underwent echocardiogram 07/2023 which showed LVEF 60 to 65%, normal RV function, moderate left atrial enlargement, small pericardial effusion, mild to moderate mitral regurgitation, mild to moderate tricuspid  regurgitation, mild aortic valve regurgitation.  #Tachy-Brady Syndrome: -S/p PPM placement -Follows with Dr. Lawana Pray  #HLD: #Aortic Atherosclerosis: -Most recent LDL 151 on 12/2022.  Reports she stopped taking Repatha  as was having myalgias but reports no change in her myalgias since stopping Repatha .  Recommend restarting Repatha  and check fasting lipid panel in 3 months  #Mild Ascending Aortic Dilation: -Measures 4.1cm on CT 10/2022.  Measured 3.9 cm on echo 07/2023 -Continue serial monitoring    #Mitral regurgitation -Moderate mitral regurgitation on echocardiogram 06/2020.  Mild to moderate on echo 07/2023     Signed, Wendie Hamburg, MD

## 2023-08-23 ENCOUNTER — Ambulatory Visit: Payer: Self-pay | Admitting: Cardiology

## 2023-09-01 ENCOUNTER — Other Ambulatory Visit: Payer: Self-pay

## 2023-09-01 DIAGNOSIS — I480 Paroxysmal atrial fibrillation: Secondary | ICD-10-CM

## 2023-09-01 MED ORDER — WARFARIN SODIUM 2.5 MG PO TABS: ORAL_TABLET | ORAL | 1 refills | Status: DC

## 2023-09-03 NOTE — Addendum Note (Signed)
 Addended by: Edra Govern D on: 09/03/2023 03:28 PM   Modules accepted: Orders

## 2023-09-03 NOTE — Progress Notes (Signed)
 Remote pacemaker transmission.

## 2023-09-05 DIAGNOSIS — E039 Hypothyroidism, unspecified: Secondary | ICD-10-CM | POA: Diagnosis not present

## 2023-09-05 DIAGNOSIS — J449 Chronic obstructive pulmonary disease, unspecified: Secondary | ICD-10-CM | POA: Diagnosis not present

## 2023-09-05 DIAGNOSIS — F418 Other specified anxiety disorders: Secondary | ICD-10-CM | POA: Diagnosis not present

## 2023-09-05 DIAGNOSIS — I10 Essential (primary) hypertension: Secondary | ICD-10-CM | POA: Diagnosis not present

## 2023-09-05 DIAGNOSIS — I4891 Unspecified atrial fibrillation: Secondary | ICD-10-CM | POA: Diagnosis not present

## 2023-09-06 DIAGNOSIS — I4891 Unspecified atrial fibrillation: Secondary | ICD-10-CM | POA: Diagnosis not present

## 2023-09-06 DIAGNOSIS — I10 Essential (primary) hypertension: Secondary | ICD-10-CM | POA: Diagnosis not present

## 2023-09-06 DIAGNOSIS — J449 Chronic obstructive pulmonary disease, unspecified: Secondary | ICD-10-CM | POA: Diagnosis not present

## 2023-09-08 ENCOUNTER — Ambulatory Visit

## 2023-09-10 ENCOUNTER — Other Ambulatory Visit: Payer: Self-pay | Admitting: Thoracic Surgery (Cardiothoracic Vascular Surgery)

## 2023-09-10 DIAGNOSIS — I7121 Aneurysm of the ascending aorta, without rupture: Secondary | ICD-10-CM

## 2023-09-11 DIAGNOSIS — F418 Other specified anxiety disorders: Secondary | ICD-10-CM | POA: Diagnosis not present

## 2023-09-11 DIAGNOSIS — R300 Dysuria: Secondary | ICD-10-CM | POA: Diagnosis not present

## 2023-09-11 DIAGNOSIS — Z6824 Body mass index (BMI) 24.0-24.9, adult: Secondary | ICD-10-CM | POA: Diagnosis not present

## 2023-09-14 ENCOUNTER — Ambulatory Visit: Attending: Internal Medicine | Admitting: *Deleted

## 2023-09-14 DIAGNOSIS — I480 Paroxysmal atrial fibrillation: Secondary | ICD-10-CM | POA: Diagnosis not present

## 2023-09-14 DIAGNOSIS — Z5181 Encounter for therapeutic drug level monitoring: Secondary | ICD-10-CM | POA: Insufficient documentation

## 2023-09-14 LAB — POCT INR: INR: 4 — AB (ref 2.0–3.0)

## 2023-09-14 NOTE — Patient Instructions (Signed)
 Description   PLEASE CONFIRM YOUR DOSE WHEN GET HOME AND CALL BACK IF NEEDED.  Do no take any warfarin today then START taking warfarin 1 tablet daily except for 1.5 tablets on Sundays and Thursdays. Coumadin  Clinic [phone] Recheck INR in 3 weeks

## 2023-09-15 ENCOUNTER — Encounter (HOSPITAL_BASED_OUTPATIENT_CLINIC_OR_DEPARTMENT_OTHER): Payer: Self-pay | Admitting: Cardiology

## 2023-09-15 NOTE — Telephone Encounter (Signed)
Duplicate encounter-disregard.

## 2023-09-22 ENCOUNTER — Other Ambulatory Visit: Payer: Self-pay | Admitting: *Deleted

## 2023-09-22 MED ORDER — METOPROLOL SUCCINATE ER 25 MG PO TB24: 25.0000 mg | ORAL_TABLET | Freq: Every day | ORAL | 3 refills | Status: AC

## 2023-09-22 NOTE — Telephone Encounter (Signed)
Routing back to triage.

## 2023-09-22 NOTE — Progress Notes (Signed)
 Called and left message with Dr. Alda Amas recommendations to decrease Toprol   XL 50mg  to Toprol  XL 25mg . Left message to call office for any questions.

## 2023-09-22 NOTE — Telephone Encounter (Signed)
 Yes we can decrease her Toprol -XL from 50 mg to 25 mg daily

## 2023-09-23 DIAGNOSIS — N390 Urinary tract infection, site not specified: Secondary | ICD-10-CM | POA: Diagnosis not present

## 2023-09-23 DIAGNOSIS — H6591 Unspecified nonsuppurative otitis media, right ear: Secondary | ICD-10-CM | POA: Diagnosis not present

## 2023-09-23 DIAGNOSIS — Z6824 Body mass index (BMI) 24.0-24.9, adult: Secondary | ICD-10-CM | POA: Diagnosis not present

## 2023-09-23 DIAGNOSIS — F419 Anxiety disorder, unspecified: Secondary | ICD-10-CM | POA: Diagnosis not present

## 2023-10-05 ENCOUNTER — Ambulatory Visit: Attending: Cardiology

## 2023-10-05 DIAGNOSIS — I4891 Unspecified atrial fibrillation: Secondary | ICD-10-CM | POA: Diagnosis not present

## 2023-10-05 DIAGNOSIS — Z5181 Encounter for therapeutic drug level monitoring: Secondary | ICD-10-CM | POA: Insufficient documentation

## 2023-10-05 DIAGNOSIS — F418 Other specified anxiety disorders: Secondary | ICD-10-CM | POA: Diagnosis not present

## 2023-10-05 DIAGNOSIS — J449 Chronic obstructive pulmonary disease, unspecified: Secondary | ICD-10-CM | POA: Diagnosis not present

## 2023-10-05 DIAGNOSIS — E039 Hypothyroidism, unspecified: Secondary | ICD-10-CM | POA: Diagnosis not present

## 2023-10-05 DIAGNOSIS — I480 Paroxysmal atrial fibrillation: Secondary | ICD-10-CM | POA: Diagnosis not present

## 2023-10-05 DIAGNOSIS — I10 Essential (primary) hypertension: Secondary | ICD-10-CM | POA: Diagnosis not present

## 2023-10-05 LAB — POCT INR: INR: 2.7 (ref 2.0–3.0)

## 2023-10-05 NOTE — Patient Instructions (Signed)
Description   Continue taking warfarin 1 tablet daily except for 1.5 tablets on Sundays and Thursdays. Recheck INR in 4 weeks. Coumadin Clinic [phone]

## 2023-10-05 NOTE — Progress Notes (Signed)
Please see anticoagulation encounter.

## 2023-10-19 ENCOUNTER — Ambulatory Visit: Payer: Medicare Other

## 2023-10-19 DIAGNOSIS — I480 Paroxysmal atrial fibrillation: Secondary | ICD-10-CM

## 2023-10-19 LAB — CUP PACEART REMOTE DEVICE CHECK
Battery Remaining Longevity: 76 mo
Battery Voltage: 2.97 V
Brady Statistic AP VP Percent: 0.11 %
Brady Statistic AP VS Percent: 97.96 %
Brady Statistic AS VP Percent: 0.01 %
Brady Statistic AS VS Percent: 1.92 %
Brady Statistic RA Percent Paced: 98.33 %
Brady Statistic RV Percent Paced: 0.12 %
Date Time Interrogation Session: 20250713221426
Implantable Lead Connection Status: 753985
Implantable Lead Connection Status: 753985
Implantable Lead Implant Date: 20180914
Implantable Lead Implant Date: 20180914
Implantable Lead Location: 753859
Implantable Lead Location: 753860
Implantable Lead Model: 5076
Implantable Lead Model: 5076
Implantable Pulse Generator Implant Date: 20180914
Lead Channel Impedance Value: 304 Ohm
Lead Channel Impedance Value: 361 Ohm
Lead Channel Impedance Value: 418 Ohm
Lead Channel Impedance Value: 570 Ohm
Lead Channel Pacing Threshold Amplitude: 0.75 V
Lead Channel Pacing Threshold Amplitude: 1.125 V
Lead Channel Pacing Threshold Pulse Width: 0.4 ms
Lead Channel Pacing Threshold Pulse Width: 0.4 ms
Lead Channel Sensing Intrinsic Amplitude: 12.5 mV
Lead Channel Sensing Intrinsic Amplitude: 12.5 mV
Lead Channel Sensing Intrinsic Amplitude: 2 mV
Lead Channel Sensing Intrinsic Amplitude: 2 mV
Lead Channel Setting Pacing Amplitude: 1.5 V
Lead Channel Setting Pacing Amplitude: 2.5 V
Lead Channel Setting Pacing Pulse Width: 0.4 ms
Lead Channel Setting Sensing Sensitivity: 0.9 mV
Zone Setting Status: 755011
Zone Setting Status: 755011

## 2023-10-20 ENCOUNTER — Ambulatory Visit: Payer: Self-pay | Admitting: Cardiology

## 2023-10-28 ENCOUNTER — Ambulatory Visit (HOSPITAL_COMMUNITY)
Admission: RE | Admit: 2023-10-28 | Discharge: 2023-10-28 | Disposition: A | Discharge status: Home / Self Care | Source: Ambulatory Visit | Attending: Thoracic Surgery (Cardiothoracic Vascular Surgery) | Admitting: Thoracic Surgery (Cardiothoracic Vascular Surgery)

## 2023-10-28 ENCOUNTER — Ambulatory Visit

## 2023-10-28 DIAGNOSIS — I480 Paroxysmal atrial fibrillation: Secondary | ICD-10-CM | POA: Insufficient documentation

## 2023-10-28 DIAGNOSIS — Z5181 Encounter for therapeutic drug level monitoring: Secondary | ICD-10-CM | POA: Insufficient documentation

## 2023-10-28 DIAGNOSIS — R918 Other nonspecific abnormal finding of lung field: Secondary | ICD-10-CM | POA: Diagnosis not present

## 2023-10-28 DIAGNOSIS — J9811 Atelectasis: Secondary | ICD-10-CM | POA: Diagnosis not present

## 2023-10-28 DIAGNOSIS — I700 Atherosclerosis of aorta: Secondary | ICD-10-CM | POA: Diagnosis not present

## 2023-10-28 DIAGNOSIS — I719 Aortic aneurysm of unspecified site, without rupture: Secondary | ICD-10-CM | POA: Diagnosis not present

## 2023-10-28 DIAGNOSIS — I517 Cardiomegaly: Secondary | ICD-10-CM | POA: Diagnosis not present

## 2023-10-28 DIAGNOSIS — I7121 Aneurysm of the ascending aorta, without rupture: Secondary | ICD-10-CM | POA: Diagnosis not present

## 2023-10-28 LAB — POCT INR: INR: 2.8 (ref 2.0–3.0)

## 2023-10-28 MED ORDER — IOHEXOL 350 MG/ML SOLN
75.0000 mL | Freq: Once | INTRAVENOUS | Status: AC | PRN
  Administered 2023-10-28: 75 mL via INTRAVENOUS

## 2023-10-28 NOTE — Patient Instructions (Signed)
 Description   Continue taking warfarin 1 tablet daily except for 1.5 tablets on Sundays and Thursdays.  Recheck INR in 5 weeks.  Coumadin  Clinic [phone]

## 2023-10-28 NOTE — Progress Notes (Signed)
 INR-2.8 Please see anticoagulation encounter

## 2023-10-30 DIAGNOSIS — M7989 Other specified soft tissue disorders: Secondary | ICD-10-CM | POA: Diagnosis not present

## 2023-11-03 NOTE — Progress Notes (Unsigned)
 [address] [place] [address]             [phone]            Markell Schrier [id number] [birth year]   History of Present Illness:  Ms. [name] is a 78 year old female with medical history of hypertension, hypothyroidism, hyperlipidemia, paroxysmal atrial fibrillation, tachycardia-bradycardia syndrome, and GERD presents for continued surveillance of ascending thoracic aortic aneurysm.  Aneurysm was found on incidental scan in 2021.  It has been relatively stable in size for the past 2 years measuring at 4.1 cm.  SHe has a history of hypertension that is well-controlled with her current medications.  Exercise symptoms    Current Outpatient Medications on File Prior to Visit  Medication Sig Dispense Refill   Cholecalciferol (VITAMIN D3 PO) Take 1 tablet by mouth daily.     clonazePAM  (KLONOPIN ) 0.5 MG tablet Take 0.5 mg by mouth daily.     Evolocumab  (REPATHA  SURECLICK) 140 MG/ML SOAJ Inject 140 mg into the skin every 14 (fourteen) days. 2 mL 11   flecainide  (TAMBOCOR ) 50 MG tablet Take 1 tablet (50 mg total) by mouth 2 (two) times daily. 60 tablet 6   FLUoxetine  (PROZAC ) 10 MG capsule Take 10 mg by mouth daily.     fluticasone (CUTIVATE) 0.05 % cream Apply 1 Application topically as needed (for irritation).     fluticasone (FLONASE) 50 MCG/ACT nasal spray Place 1 spray into both nostrils as needed.     levothyroxine  (SYNTHROID ) 100 MCG tablet Take 100 mcg by mouth every morning.     loratadine  (CLARITIN ) 10 MG tablet Take 10 mg by mouth daily as needed for allergies.     metoprolol  succinate (TOPROL  XL) 25 MG 24 hr tablet Take 1 tablet (25 mg total) by mouth daily. 90 tablet 3   metoprolol  tartrate (LOPRESSOR ) 25 MG tablet Take 0.5 tablets (12.5 mg total) by mouth 2 (two) times daily as needed (palpitations). 30 tablet 1   pantoprazole  (PROTONIX ) 40 MG tablet Take 40 mg by mouth 2 (two) times daily.     sucralfate (CARAFATE) 1  g tablet Sometimes will take extra when needed     vitamin B-12 (CYANOCOBALAMIN ) 1000 MCG tablet Take 1,000 mcg by mouth daily.     warfarin (COUMADIN ) 2.5 MG tablet TAKE 1 AND 1/2 (ONE AND ONE HALF) TABLETS BY MOUTH DAILY OR AS DIRECTED BY COUMADIN  CLINIC. 120 tablet 1   No current facility-administered medications on file prior to visit.     ROS:   There were no vitals taken for this visit.    Imaging:  Narrative & Impression  CLINICAL DATA:  Aortic aneurysm suspected   EXAM: CT ANGIOGRAPHY CHEST WITH CONTRAST   TECHNIQUE: Multidetector CT imaging through the chest was performed using the standard protocol during bolus administration of intravenous contrast. Multiplanar reconstructed images and MIPs were obtained and reviewed to evaluate the vascular anatomy.   RADIATION DOSE REDUCTION: This exam was performed according to the departmental dose-optimization program which includes automated exposure control, adjustment of the mA and/or kV according to patient size and/or use of iterative reconstruction technique.   CONTRAST:  75mL OMNIPAQUE  IOHEXOL  350 MG/ML SOLN   COMPARISON:  October 30, 2022, October 28, 2021   FINDINGS: Pulmonary Embolism: No pulmonary embolism.   Cardiovascular: Mild cardiomegaly. Trace pericardial effusion. Left chest pacemaker/AICD with leads terminating in the right atrium and right ventricle. Fusiform dilation of the  ascending aorta, measuring 4.1 cm, unchanged. No aortic dissection. Scattered atherosclerosis in the descending aorta. Lad atherosclerosis.   Mediastinum/Nodes: No mediastinal mass. No mediastinal, hilar, or axillary lymphadenopathy.   Lungs/Pleura: The midline trachea and bronchi are patent. Posterior bibasilar dependent atelectasis. No focal airspace consolidation, pleural effusion, or pneumothorax. 3 mm nodule in the anterobasal left lower lobe (axial 330). There is a smaller 2 mm nodule (axial 341) just laterally in the  anterobasal left lower lobe.   Musculoskeletal: No acute fracture or destructive bone lesion. Multilevel degenerative disc disease of the spine.   Upper Abdomen: No acute abnormality within the partially visualized upper abdomen.   Review of the MIP images confirms the above findings.   IMPRESSION: 1. Similar, mild fusiform dilation of the ascending aorta, which measures 4.1 cm. No aortic dissection. Recommend annual imaging followup by CTA or MRA. This recommendation follows 2010 ACCF/AHA/AATS/ACR/ASA/SCA/SCAI/SIR/STS/SVM Guidelines for the Diagnosis and Management of Patients with Thoracic Aortic Disease. Circulation. 2010; 121: Z733-z630. Aortic aneurysm NOS (ICD10-I71.9) 2. Otherwise, no acute intrathoracic abnormality; specifically, no pulmonary embolism, pneumonia, or pleural effusion. 3. A couple of small lung nodules are present in the left lower lobe measuring up to 3 mm (axial 330). Continued attention on annual aneurysm follow-up imaging recommended. 4.  Aortic Atherosclerosis (ICD10-I70.0).     Electronically Signed   By: Rogelia Myers M.D.   On: 10/30/2023 08:48      A/P: Ascending aortic aneurysm, unspecified whether ruptured (HCC) -4.1 cm ascending thoracic aortic aneurysm on CTA of chest on 10-28-2023. We discussed the natural history and and risk factors for growth of ascending aortic aneurysms. Discussed recommendations to minimize the risk of further expansion or dissection including careful blood pressure control, avoidance of contact sports and heavy lifting, attention to lipid management.  We covered the importance of smoking ***cessation/staying never user.  The patient does not yet meet surgical criteria of >5.5cm. The patient is aware of signs and symptoms of aortic dissection and when to present to the emergency department   - Follow-up in 1 year with CTA of chest for continued surveillance     Risk Modification:  Statin:  ***  Smoking  cessation instruction/counseling given:  {CHL AMB [facility] SMOKING CESSATION COUNSELING:20758}  Patient was counseled on importance of Blood Pressure Control  They are instructed to contact their Primary Care Physician if they start to have blood pressure readings over 130s/90s. Do not ever stop blood pressure medications on your own, unless instructed by healthcare professional.  Please avoid use of Fluoroquinolones as this can potentially increase your risk of Aortic Rupture and/or Dissection  Patient educated on signs and symptoms of Aortic Dissection, handout also provided in AVS  Cathy CHRISTELLA Rough, PA-C 11/03/23

## 2023-11-04 ENCOUNTER — Ambulatory Visit

## 2023-11-04 VITALS — BP 111/72 | HR 88 | Resp 18 | Ht 65.0 in | Wt 145.0 lb

## 2023-11-04 DIAGNOSIS — I7121 Aneurysm of the ascending aorta, without rupture: Secondary | ICD-10-CM

## 2023-11-04 NOTE — Patient Instructions (Signed)

## 2023-11-05 DIAGNOSIS — F418 Other specified anxiety disorders: Secondary | ICD-10-CM | POA: Diagnosis not present

## 2023-11-05 DIAGNOSIS — I10 Essential (primary) hypertension: Secondary | ICD-10-CM | POA: Diagnosis not present

## 2023-11-05 DIAGNOSIS — J449 Chronic obstructive pulmonary disease, unspecified: Secondary | ICD-10-CM | POA: Diagnosis not present

## 2023-11-05 DIAGNOSIS — I4891 Unspecified atrial fibrillation: Secondary | ICD-10-CM | POA: Diagnosis not present

## 2023-11-05 DIAGNOSIS — E039 Hypothyroidism, unspecified: Secondary | ICD-10-CM | POA: Diagnosis not present

## 2023-11-24 DIAGNOSIS — Z6823 Body mass index (BMI) 23.0-23.9, adult: Secondary | ICD-10-CM | POA: Diagnosis not present

## 2023-11-24 DIAGNOSIS — F329 Major depressive disorder, single episode, unspecified: Secondary | ICD-10-CM | POA: Diagnosis not present

## 2023-11-24 DIAGNOSIS — R413 Other amnesia: Secondary | ICD-10-CM | POA: Diagnosis not present

## 2023-11-25 DIAGNOSIS — R1013 Epigastric pain: Secondary | ICD-10-CM | POA: Diagnosis not present

## 2023-11-25 DIAGNOSIS — K219 Gastro-esophageal reflux disease without esophagitis: Secondary | ICD-10-CM | POA: Diagnosis not present

## 2023-11-25 DIAGNOSIS — R141 Gas pain: Secondary | ICD-10-CM | POA: Diagnosis not present

## 2023-12-02 DIAGNOSIS — D2272 Melanocytic nevi of left lower limb, including hip: Secondary | ICD-10-CM | POA: Diagnosis not present

## 2023-12-02 DIAGNOSIS — L820 Inflamed seborrheic keratosis: Secondary | ICD-10-CM | POA: Diagnosis not present

## 2023-12-02 DIAGNOSIS — D485 Neoplasm of uncertain behavior of skin: Secondary | ICD-10-CM | POA: Diagnosis not present

## 2023-12-02 DIAGNOSIS — L821 Other seborrheic keratosis: Secondary | ICD-10-CM | POA: Diagnosis not present

## 2023-12-02 DIAGNOSIS — L538 Other specified erythematous conditions: Secondary | ICD-10-CM | POA: Diagnosis not present

## 2023-12-02 DIAGNOSIS — L814 Other melanin hyperpigmentation: Secondary | ICD-10-CM | POA: Diagnosis not present

## 2023-12-02 DIAGNOSIS — D225 Melanocytic nevi of trunk: Secondary | ICD-10-CM | POA: Diagnosis not present

## 2023-12-02 DIAGNOSIS — B351 Tinea unguium: Secondary | ICD-10-CM | POA: Diagnosis not present

## 2023-12-03 ENCOUNTER — Ambulatory Visit: Attending: Internal Medicine | Admitting: Pharmacist

## 2023-12-03 ENCOUNTER — Telehealth: Payer: Self-pay | Admitting: Pharmacist

## 2023-12-03 ENCOUNTER — Telehealth: Payer: Self-pay | Admitting: Pharmacy Technician

## 2023-12-03 ENCOUNTER — Other Ambulatory Visit (HOSPITAL_COMMUNITY): Payer: Self-pay

## 2023-12-03 DIAGNOSIS — Z5181 Encounter for therapeutic drug level monitoring: Secondary | ICD-10-CM | POA: Diagnosis not present

## 2023-12-03 DIAGNOSIS — I480 Paroxysmal atrial fibrillation: Secondary | ICD-10-CM | POA: Diagnosis not present

## 2023-12-03 LAB — POCT INR: INR: 2.9 (ref 2.0–3.0)

## 2023-12-03 MED ORDER — RIVAROXABAN 20 MG PO TABS
20.0000 mg | ORAL_TABLET | Freq: Every day | ORAL | 0 refills | Status: DC
  Filled 2023-12-03: qty 30, 30d supply, fill #0

## 2023-12-03 NOTE — Telephone Encounter (Signed)
 [name]

## 2023-12-03 NOTE — Telephone Encounter (Signed)
 Please run test claim for xarelto  20mg  #30

## 2023-12-03 NOTE — Patient Instructions (Signed)
 Description   Continue taking warfarin 1 tablet daily except for 1.5 tablets on Sundays and Thursdays.  Recheck INR in 6 weeks.  Coumadin  Clinic [phone]

## 2023-12-03 NOTE — Progress Notes (Signed)
 INR 2.9; Please see anticoagulation encounter   Description   Continue taking warfarin 1 tablet daily except for 1.5 tablets on Sundays and Thursdays.  Recheck INR in 6 weeks.  Coumadin  Clinic [phone]      Long discussion regarding anticoagulants. Patient interested in switching to DOAC. Explained differences between Xarelto  and Eliquis . Patient interested in starting Xarelto . CrCl 49 ml/min however she is due for lab work next month at PCP. Will start 20mg  at first and wait for updated labs.

## 2023-12-05 DIAGNOSIS — I10 Essential (primary) hypertension: Secondary | ICD-10-CM | POA: Diagnosis not present

## 2023-12-05 DIAGNOSIS — J449 Chronic obstructive pulmonary disease, unspecified: Secondary | ICD-10-CM | POA: Diagnosis not present

## 2023-12-05 DIAGNOSIS — I4891 Unspecified atrial fibrillation: Secondary | ICD-10-CM | POA: Diagnosis not present

## 2023-12-06 DIAGNOSIS — F418 Other specified anxiety disorders: Secondary | ICD-10-CM | POA: Diagnosis not present

## 2023-12-06 DIAGNOSIS — I10 Essential (primary) hypertension: Secondary | ICD-10-CM | POA: Diagnosis not present

## 2023-12-06 DIAGNOSIS — E039 Hypothyroidism, unspecified: Secondary | ICD-10-CM | POA: Diagnosis not present

## 2023-12-06 DIAGNOSIS — I4891 Unspecified atrial fibrillation: Secondary | ICD-10-CM | POA: Diagnosis not present

## 2023-12-06 DIAGNOSIS — J449 Chronic obstructive pulmonary disease, unspecified: Secondary | ICD-10-CM | POA: Diagnosis not present

## 2023-12-30 ENCOUNTER — Telehealth: Payer: Self-pay | Admitting: Cardiology

## 2023-12-30 NOTE — Telephone Encounter (Signed)
 Pt requesting c/b from chris regarding Xarelto  medication. Pt states that it was discussed of another medication that she could take BID. Pt states she only has a few days left of Xarelto  and would like a c/b regarding this matter as soon as possible. Please advise

## 2023-12-31 MED ORDER — APIXABAN 5 MG PO TABS: 5.0000 mg | ORAL_TABLET | Freq: Two times a day (BID) | ORAL | 5 refills | Status: AC

## 2023-12-31 NOTE — Telephone Encounter (Signed)
 Prescription request for Eliquis  received. Indication: afib  Last office visit:08/19/2023 Scr: 1.06, 07/10/2023 Age: 78 yo  Weight: 65.8 kg   Called and spoke to pt. Instructed her to take her last dose of Xarelto  in the evening of 9/26. In the evening on 9/27 pt is going to start taking Eliquis . Take Eliquis  5mg  ( 1 tablet by mouth BID about 12 hours apart). Pt stated that Eliquis  would cost her about $50 a month and she was okay with paying that price.    A full discussion of the nature of anticoagulants has been carried out.  A benefit/risk analysis has been presented to the patient, so that they understand the justification for choosing anticoagulation with Eliquis  at this time.  The need for compliance is stressed.  Pt is aware to take the medication twice daily.  Side effects of potential bleeding are discussed.

## 2023-12-31 NOTE — Telephone Encounter (Addendum)
 Yes, can change to 5mg  BID. Should take first dose the evening when her next Xarelto  dose would be due. Then begin taking twice daily.

## 2023-12-31 NOTE — Telephone Encounter (Signed)
 Pt called and stated that she would like to switch to Eliquis . Pt stated that she had talked to Medford Gower D when she switched from warfarin to Xarelto . Her first 30 days of Xarelto  were free and then she was wanting to switch to Eliquis . She has 2 tablets left of Xarelto .

## 2024-01-04 DIAGNOSIS — J449 Chronic obstructive pulmonary disease, unspecified: Secondary | ICD-10-CM | POA: Diagnosis not present

## 2024-01-04 DIAGNOSIS — I4891 Unspecified atrial fibrillation: Secondary | ICD-10-CM | POA: Diagnosis not present

## 2024-01-04 DIAGNOSIS — I10 Essential (primary) hypertension: Secondary | ICD-10-CM | POA: Diagnosis not present

## 2024-01-05 DIAGNOSIS — E039 Hypothyroidism, unspecified: Secondary | ICD-10-CM | POA: Diagnosis not present

## 2024-01-05 DIAGNOSIS — F418 Other specified anxiety disorders: Secondary | ICD-10-CM | POA: Diagnosis not present

## 2024-01-05 DIAGNOSIS — I10 Essential (primary) hypertension: Secondary | ICD-10-CM | POA: Diagnosis not present

## 2024-01-05 DIAGNOSIS — J449 Chronic obstructive pulmonary disease, unspecified: Secondary | ICD-10-CM | POA: Diagnosis not present

## 2024-01-05 DIAGNOSIS — I4891 Unspecified atrial fibrillation: Secondary | ICD-10-CM | POA: Diagnosis not present

## 2024-01-12 DIAGNOSIS — H02055 Trichiasis without entropian left lower eyelid: Secondary | ICD-10-CM | POA: Diagnosis not present

## 2024-01-14 DIAGNOSIS — L905 Scar conditions and fibrosis of skin: Secondary | ICD-10-CM | POA: Diagnosis not present

## 2024-01-14 DIAGNOSIS — D485 Neoplasm of uncertain behavior of skin: Secondary | ICD-10-CM | POA: Diagnosis not present

## 2024-01-14 DIAGNOSIS — L820 Inflamed seborrheic keratosis: Secondary | ICD-10-CM | POA: Diagnosis not present

## 2024-01-14 DIAGNOSIS — L538 Other specified erythematous conditions: Secondary | ICD-10-CM | POA: Diagnosis not present

## 2024-01-14 NOTE — Progress Notes (Signed)
 Remote PPM Transmission

## 2024-01-18 ENCOUNTER — Ambulatory Visit (INDEPENDENT_AMBULATORY_CARE_PROVIDER_SITE_OTHER): Payer: Medicare Other

## 2024-01-18 DIAGNOSIS — I480 Paroxysmal atrial fibrillation: Secondary | ICD-10-CM | POA: Diagnosis not present

## 2024-01-18 LAB — CUP PACEART REMOTE DEVICE CHECK
Battery Remaining Longevity: 72 mo
Battery Voltage: 2.97 V
Brady Statistic AP VP Percent: 0.18 %
Brady Statistic AP VS Percent: 98.56 %
Brady Statistic AS VP Percent: 0 %
Brady Statistic AS VS Percent: 1.25 %
Brady Statistic RA Percent Paced: 98.83 %
Brady Statistic RV Percent Paced: 0.2 %
Date Time Interrogation Session: 20251012232647
Implantable Lead Connection Status: 753985
Implantable Lead Connection Status: 753985
Implantable Lead Implant Date: 20180914
Implantable Lead Implant Date: 20180914
Implantable Lead Location: 753859
Implantable Lead Location: 753860
Implantable Lead Model: 5076
Implantable Lead Model: 5076
Implantable Pulse Generator Implant Date: 20180914
Lead Channel Impedance Value: 304 Ohm
Lead Channel Impedance Value: 361 Ohm
Lead Channel Impedance Value: 399 Ohm
Lead Channel Impedance Value: 513 Ohm
Lead Channel Pacing Threshold Amplitude: 0.625 V
Lead Channel Pacing Threshold Amplitude: 1.125 V
Lead Channel Pacing Threshold Pulse Width: 0.4 ms
Lead Channel Pacing Threshold Pulse Width: 0.4 ms
Lead Channel Sensing Intrinsic Amplitude: 11.75 mV
Lead Channel Sensing Intrinsic Amplitude: 11.75 mV
Lead Channel Sensing Intrinsic Amplitude: 2.25 mV
Lead Channel Sensing Intrinsic Amplitude: 2.25 mV
Lead Channel Setting Pacing Amplitude: 1.5 V
Lead Channel Setting Pacing Amplitude: 2.5 V
Lead Channel Setting Pacing Pulse Width: 0.4 ms
Lead Channel Setting Sensing Sensitivity: 0.9 mV
Zone Setting Status: 755011
Zone Setting Status: 755011

## 2024-01-19 NOTE — Progress Notes (Signed)
 Remote PPM Transmission

## 2024-01-26 DIAGNOSIS — Z1331 Encounter for screening for depression: Secondary | ICD-10-CM | POA: Diagnosis not present

## 2024-01-26 DIAGNOSIS — E559 Vitamin D deficiency, unspecified: Secondary | ICD-10-CM | POA: Diagnosis not present

## 2024-01-26 DIAGNOSIS — F418 Other specified anxiety disorders: Secondary | ICD-10-CM | POA: Diagnosis not present

## 2024-01-26 DIAGNOSIS — E538 Deficiency of other specified B group vitamins: Secondary | ICD-10-CM | POA: Diagnosis not present

## 2024-01-26 DIAGNOSIS — I10 Essential (primary) hypertension: Secondary | ICD-10-CM | POA: Diagnosis not present

## 2024-01-26 DIAGNOSIS — M8580 Other specified disorders of bone density and structure, unspecified site: Secondary | ICD-10-CM | POA: Diagnosis not present

## 2024-01-26 DIAGNOSIS — I4891 Unspecified atrial fibrillation: Secondary | ICD-10-CM | POA: Diagnosis not present

## 2024-01-26 DIAGNOSIS — Z0000 Encounter for general adult medical examination without abnormal findings: Secondary | ICD-10-CM | POA: Diagnosis not present

## 2024-01-26 DIAGNOSIS — Z23 Encounter for immunization: Secondary | ICD-10-CM | POA: Diagnosis not present

## 2024-01-26 DIAGNOSIS — D0361 Melanoma in situ of right upper limb, including shoulder: Secondary | ICD-10-CM | POA: Diagnosis not present

## 2024-01-26 DIAGNOSIS — E039 Hypothyroidism, unspecified: Secondary | ICD-10-CM | POA: Diagnosis not present

## 2024-01-26 DIAGNOSIS — E785 Hyperlipidemia, unspecified: Secondary | ICD-10-CM | POA: Diagnosis not present

## 2024-01-26 LAB — COMPREHENSIVE METABOLIC PANEL WITH GFR: EGFR: 62

## 2024-01-27 DIAGNOSIS — Z23 Encounter for immunization: Secondary | ICD-10-CM | POA: Diagnosis not present

## 2024-01-29 ENCOUNTER — Ambulatory Visit: Payer: Self-pay | Admitting: Cardiology

## 2024-01-31 NOTE — Progress Notes (Unsigned)
 Cardiology Office Note:   Date:  02/01/2024  ID:  [name], DOB 1945-08-03, MRN [id number]  History of Present Illness:   [name] is a 78 y.o. female with history of COPD, CVA, HTN, HLD, tachy-brady syndrome s/p PPM placement, Afib, mild ascending aortic dilation who presents for follow-up.  She previously followed with Dr. Hobart, last seen 11/03/2022.    Myoview  11/2021 normal and low risk. TTE 06/2020 with LVEF 55-60%, normal RV, moderate LAE, moderate MR, mild AR.  Reported chest pain and underwent stress PET 07/2023 which showed normal perfusion and myocardial blood flow reserve, mild coronary calcifications, LVEF 21%.  Underwent echocardiogram 07/2023 which showed LVEF 60 to 65%, normal RV function, moderate left atrial enlargement, small pericardial effusion, mild to moderate mitral regurgitation, mild to moderate tricuspid regurgitation, mild aortic valve regurgitation.  Since last clinic visit, she reports she is doing okay.  Does report has been having dyspnea with minimal exertion.  Reports chest pain 1-2 times per week, reports last for few seconds and resolves.  She reports has not been taking her Repatha .  Reports rare palpitations.  She is on Eliquis , denies any bleeding issues.  Past Medical History:  Diagnosis Date   Acoustic neuroma (HCC)    Hx of right ear acoustic neuroma, removed in 1999. No hearing in right ear.   Anxiety    Hx of, responds to Wellbutrin   Basal cell carcinoma    s/p MOHS surgery in 03/2011   Breast cancer (HCC) 2021   left lumpectomy   Cataract    Chronic anticoagulation 11/24/2016   Chronic cholecystitis with calculus 05/13/2013   Closed fracture of left lateral malleolus 08/10/2017   COPD (chronic obstructive pulmonary disease) (HCC)    Deafness in right ear    Embolic stroke (HCC) 11/24/2016   Family history of adverse reaction to anesthesia    Family hx of PONV   Fracture    left lateral malleolus fracture    GERD (gastroesophageal reflux disease)    Headache(784.0)    MIGRAINES   Hearing loss    Only in right ear, from an acoustic neuroma. S/P removal in 1999.    History of hiatal hernia    History of neck pain    Responds to Flexeril   History of shingles    Hypercholesteremia    Hypertension    Hypothyroidism    Melanoma (HCC) 2018   right arm-surgery only   PAF (paroxysmal atrial fibrillation) (HCC)    2 brief episodes of Afib recorded on monitor 02/2007   Paroxysmal atrial fibrillation (HCC)    PONV (postoperative nausea and vomiting)    Presence of permanent cardiac pacemaker 2018   for A-fib   Sick sinus syndrome (HCC) 12/19/2016   Sinus bradycardia    Tachycardia-bradycardia syndrome (HCC) 11/24/2016   TIA (transient ischemic attack) 2011   On coumadin . Asymptomatic, without recurrence.     ROS: As per HPI  Studies Reviewed:    EKG:   02/01/2024: Atrial paced, ventricular sensed, rate 63, nonspecific T wave flattening  Cardiac Studies & Procedures   ______________________________________________________________________________________________   STRESS TESTS  NM PET CT CARDIAC PERFUSION MULTI W/ABSOLUTE BLOODFLOW 07/15/2023  Narrative   Normal perfusion and normal myocardial blood flow reserve.  Mild coronary calcifications on CT.  However, LVEF is severely reduced (21% at rest, 22% with stress).  Study suggests nonischemic cardiomyopathy, and is high risk due to severe systolic dysfunction   LV perfusion is normal. There is no  evidence of ischemia. There is no evidence of infarction.   Rest left ventricular function is abnormal. Rest global function is severely reduced. Rest EF: 21%. Stress left ventricular function is abnormal. Stress global function is severely reduced. Stress EF: 22%. End diastolic cavity size is normal. End systolic cavity size is normal.   Coronary calcium  was present on the attenuation correction CT images. Mild coronary calcifications were  present. Coronary calcifications were present in the left anterior descending artery distribution(s).   Findings are consistent with no ischemia. The study is high risk.   Electronically signed by Lonni Nanas, MD  EXAM: OVER-READ INTERPRETATION  CT CHEST  The following report is a limited chest CT over-read performed by radiologist Dr. Elsie Ko [facility] on 07/15/2023. This over-read does not include interpretation of cardiac or coronary anatomy or pathology nor does it include evaluation of the PET data. The cardiac PET-CT interpretation by the cardiologist is attached.  COMPARISON:  Chest CT 10/30/2022  FINDINGS: Mediastinum/Nodes: No enlarged lymph nodes within the visualized mediastinum.Cardiac pacemaker and aortic atherosclerosis noted.  Lungs/Pleura: Mild nonspecific right pleural thickening. No significant pleural effusion. Mild dependent atelectasis and central airway thickening within both visualized lungs.  Upper abdomen: No significant findings in the visualized upper abdomen.  Musculoskeletal/Chest wall: No chest wall mass or suspicious osseous findings within the visualized chest.  IMPRESSION: No significant extracardiac findings within the visualized chest.   Electronically Signed By: Elsie Perone M.D. On: 07/15/2023 11:24   ECHOCARDIOGRAM  ECHOCARDIOGRAM COMPLETE 07/22/2023  Narrative ECHOCARDIOGRAM REPORT    Patient Name:   [name] [facility] Date of Exam: 07/22/2023 Medical Rec #:  [id number]                Height:       65.0 in Accession #:    [id number]               Weight:       150.2 lb Date of Birth:  [birth year]                BSA:          1.751 m Patient Age:    78 years                 BP:           105/79 mmHg Patient Gender: F                        HR:           67 bpm. Exam Location:  [address]  Procedure: 2D Echo, Cardiac Doppler and Color Doppler (Both Spectral and Color Flow Doppler were  utilized during procedure).  Indications:    I51.9 Systolic Dysfunction  History:        Patient has prior history of Echocardiogram examinations, most recent 06/04/2023. Pacemaker, Stroke, Arrythmias:SSS, Tachycardia, Bradycardia and Atrial Fibrillation; Risk Factors:Hypertension.  Sonographer:    Waldo Guadalajara RCS Referring Phys: [id number] Cottrell Gentles L [employer]  IMPRESSIONS   1. Left ventricular ejection fraction, by estimation, is 60 to 65%. The left ventricle has normal function. The left ventricle has no regional wall motion abnormalities. Left ventricular diastolic parameters are indeterminate. 2. Right ventricular systolic function is normal. The right ventricular size is normal. There is mildly elevated pulmonary artery systolic pressure. 3. Left atrial size was moderately dilated. 4. A small pericardial effusion is present. The pericardial effusion is circumferential. 5. The mitral valve  is normal in structure. Mild to moderate mitral valve regurgitation. No evidence of mitral stenosis. 6. Tricuspid valve regurgitation is mild to moderate. 7. The aortic valve is tricuspid. There is mild calcification of the aortic valve. There is mild thickening of the aortic valve. Aortic valve regurgitation is mild. Aortic valve sclerosis/calcification is present, without any evidence of aortic stenosis. 8. Aortic dilatation noted. There is borderline dilatation of the ascending aorta, measuring 39 mm. 9. The inferior vena cava is normal in size with greater than 50% respiratory variability, suggesting right atrial pressure of 3 mmHg.  Comparison(s): Prior images reviewed side by side. Changes from prior study are noted.  FINDINGS Left Ventricle: Left ventricular ejection fraction, by estimation, is 60 to 65%. The left ventricle has normal function. The left ventricle has no regional wall motion abnormalities. The left ventricular internal cavity size was normal in size. There is no left  ventricular hypertrophy. Left ventricular diastolic parameters are indeterminate.  Right Ventricle: The right ventricular size is normal. Right vetricular wall thickness was not well visualized. Right ventricular systolic function is normal. There is mildly elevated pulmonary artery systolic pressure. The tricuspid regurgitant velocity is 3.24 m/s, and with an assumed right atrial pressure of 3 mmHg, the estimated right ventricular systolic pressure is 45.0 mmHg.  Left Atrium: Left atrial size was moderately dilated.  Right Atrium: Right atrial size was normal in size.  Pericardium: A small pericardial effusion is present. The pericardial effusion is circumferential.  Mitral Valve: The mitral valve is normal in structure. Mild to moderate mitral valve regurgitation. No evidence of mitral valve stenosis.  Tricuspid Valve: The tricuspid valve is normal in structure. Tricuspid valve regurgitation is mild to moderate. No evidence of tricuspid stenosis.  Aortic Valve: The aortic valve is tricuspid. There is mild calcification of the aortic valve. There is mild thickening of the aortic valve. Aortic valve regurgitation is mild. Aortic regurgitation PHT measures 727 msec. Aortic valve sclerosis/calcification is present, without any evidence of aortic stenosis.  Pulmonic Valve: The pulmonic valve was not well visualized. Pulmonic valve regurgitation is not visualized. No evidence of pulmonic stenosis.  Aorta: Aortic dilatation noted. There is borderline dilatation of the ascending aorta, measuring 39 mm.  Venous: The inferior vena cava is normal in size with greater than 50% respiratory variability, suggesting right atrial pressure of 3 mmHg.  IAS/Shunts: The atrial septum is grossly normal.  Additional Comments: A device lead is visualized in the right atrium and right ventricle.   LEFT VENTRICLE PLAX 2D LVIDd:         4.50 cm   Diastology LVIDs:         3.00 cm   LV e' medial:    0.09  cm/s LV PW:         1.00 cm   LV E/e' medial:  11.5 LV IVS:        0.80 cm   LV e' lateral:   0.12 cm/s LVOT diam:     2.10 cm   LV E/e' lateral: 8.8 LV SV:         58 LV SV Index:   33 LVOT Area:     3.46 cm   RIGHT VENTRICLE RV Basal diam:  3.40 cm RV S prime:     12.00 cm/s TAPSE (M-mode): 2.0 cm RVSP:           45.0 mmHg  LEFT ATRIUM             Index  RIGHT ATRIUM           Index LA diam:        5.00 cm 2.85 cm/m   RA Pressure: 3.00 mmHg LA Vol (A2C):   70.5 ml 40.25 ml/m  RA Area:     15.40 cm LA Vol (A4C):   52.7 ml 30.09 ml/m  RA Volume:   42.60 ml  24.32 ml/m LA Biplane Vol: 61.6 ml 35.17 ml/m AORTIC VALVE LVOT Vmax:   77.70 cm/s LVOT Vmean:  53.275 cm/s LVOT VTI:    0.169 m AI PHT:      727 msec  AORTA Ao Root diam: 3.20 cm Ao Asc diam:  3.90 cm  MITRAL VALVE                  TRICUSPID VALVE MV Area (PHT):                TR Peak grad:   42.0 mmHg MV Decel Time:                TR Vmax:        324.00 cm/s MR Peak grad:    64.0 mmHg    Estimated RAP:  3.00 mmHg MR Mean grad:    48.0 mmHg    RVSP:           45.0 mmHg MR Vmax:         400.00 cm/s MR Vmean:        328.0 cm/s   SHUNTS MR PISA:         2.65 cm     Systemic VTI:  0.17 m MR PISA Eff ROA: 20 mm       Systemic Diam: 2.10 cm MR PISA Radius:  0.65 cm MV E velocity: 1.06 cm/s  Shelda Bruckner MD Electronically signed by Shelda Bruckner MD Signature Date/Time: 07/22/2023/8:48:26 PM    Final    MONITORS  CARDIAC EVENT MONITOR 08/08/2014       ______________________________________________________________________________________________       Risk Assessment/Calculations:    CHA2DS2-VASc Score =    This indicates a  % annual risk of stroke. The patient's score is based upon:             Physical Exam:   VS:  BP 101/68 (BP Location: Left Arm, Patient Position: Sitting, Cuff Size: Normal)   Pulse 63   Ht 5' 5 (1.651 m)   Wt 146 lb (66.2 kg)   SpO2 95%   BMI  24.30 kg/m    Wt Readings from Last 3 Encounters:  02/01/24 146 lb (66.2 kg)  11/04/23 145 lb (65.8 kg)  08/19/23 150 lb (68 kg)     GEN: Well nourished, well developed in no acute distress NECK: No JVD; No carotid bruits CARDIAC: RRR, no murmurs, rubs, gallops RESPIRATORY:  Clear to auscultation without rales, wheezing or rhonchi  ABDOMEN: Soft, non-tender, non-distended EXTREMITIES:  No edema; No deformity   ASSESSMENT AND PLAN:   #Paroxsymal Afib: -CHADs-vasc 6 -Continue metop 50mg  XL daily.  Also on metoprolol  12.5mg  BID prn for palpitations -Started on flecainide  by EP 02/2023, reports palpitations significantly improved since starting flecainide  -Continue Eliquis  5 mg twice daily  #Chest pain -Reported chest pain and underwent stress PET 07/2023 which showed normal perfusion and myocardial blood flow reserve, mild coronary calcifications, LVEF 21%.  Underwent echocardiogram 07/2023 which showed LVEF 60 to 65%, normal RV function, moderate left atrial enlargement, small pericardial effusion, mild to moderate mitral regurgitation, mild  to moderate tricuspid regurgitation, mild aortic valve regurgitation.  #Tachy-Brady Syndrome: -S/p PPM placement -Follows with Dr. Inocencio  #HLD: #Aortic Atherosclerosis: -Most recent LDL 151 on 12/2022.  Reports she stopped taking Repatha  as was having myalgias but reports no change in her myalgias since stopping Repatha .  She reports has not been taking Repatha  very often over last few months.  Recommend restarting Repatha  and check fasting lipid panel in 3 months  #Mild Ascending Aortic Dilation: -Measures 4.1cm on CT 10/2022.  Measured 3.9 cm on echo 07/2023 -Continue serial monitoring    #Mitral regurgitation -Moderate mitral regurgitation on echocardiogram 06/2020.  Mild to moderate on echo 07/2023 -Plan repeat echo 07/2024  #Dysuria - Check UA with reflex to culture     Signed, Lonni LITTIE Nanas, MD

## 2024-02-01 ENCOUNTER — Ambulatory Visit: Attending: Cardiology | Admitting: Cardiology

## 2024-02-01 ENCOUNTER — Encounter: Payer: Self-pay | Admitting: Cardiology

## 2024-02-01 VITALS — BP 101/68 | HR 63 | Ht 65.0 in | Wt 146.0 lb

## 2024-02-01 DIAGNOSIS — R300 Dysuria: Secondary | ICD-10-CM | POA: Insufficient documentation

## 2024-02-01 DIAGNOSIS — I77819 Aortic ectasia, unspecified site: Secondary | ICD-10-CM | POA: Insufficient documentation

## 2024-02-01 DIAGNOSIS — I340 Nonrheumatic mitral (valve) insufficiency: Secondary | ICD-10-CM | POA: Diagnosis not present

## 2024-02-01 DIAGNOSIS — E785 Hyperlipidemia, unspecified: Secondary | ICD-10-CM | POA: Insufficient documentation

## 2024-02-01 DIAGNOSIS — I480 Paroxysmal atrial fibrillation: Secondary | ICD-10-CM | POA: Insufficient documentation

## 2024-02-01 LAB — MICROSCOPIC EXAMINATION

## 2024-02-01 NOTE — Patient Instructions (Addendum)
 Medication Instructions:  Start Repatha  as directed by your Provider Your physician recommends that you continue on your current medications as directed. Please refer to the Current Medication list given to you today.  *If you need a refill on your cardiac medications before your next appointment, please call your pharmacy*  Lab Work: Lipid panel in 3 months If you have labs (blood work) drawn today and your tests are completely normal, you will receive your results only by: MyChart Message (if you have MyChart) OR A paper copy in the mail If you have any lab test that is abnormal or we need to change your treatment, we will call you to review the results.  Testing/Procedures: Echo to be done in 6 months  Your physician has requested that you have an echocardiogram. Echocardiography is a painless test that uses sound waves to create images of your heart. It provides your doctor with information about the size and shape of your heart and how well your heart's chambers and valves are working. This procedure takes approximately one hour. There are no restrictions for this procedure. Please do NOT wear cologne, perfume, aftershave, or lotions (deodorant is allowed). Please arrive 15 minutes prior to your appointment time.  Please note: We ask at that you not bring children with you during ultrasound (echo/ vascular) testing. Due to room size and safety concerns, children are not allowed in the ultrasound rooms during exams. Our front office staff cannot provide observation of children in our lobby area while testing is being conducted. An adult accompanying a patient to their appointment will only be allowed in the ultrasound room at the discretion of the ultrasound technician under special circumstances. We apologize for any inconvenience.   Follow-Up: At [facility], you and your health needs are our priority.  As part of our continuing mission to provide you with exceptional heart  care, our providers are all part of one team.  This team includes your primary Cardiologist (physician) and Advanced Practice Providers or APPs (Physician Assistants and Nurse Practitioners) who all work together to provide you with the care you need, when you need it.  Your next appointment:   Post echo 6 month  Provider:   Dr.Schumann  We recommend signing up for the patient portal called MyChart.  Sign up information is provided on this After Visit Summary.  MyChart is used to connect with patients for Virtual Visits (Telemedicine).  Patients are able to view lab/test results, encounter notes, upcoming appointments, etc.  Non-urgent messages can be sent to your provider as well.   To learn more about what you can do with MyChart, go to [email or web address].   Other Instructions none

## 2024-02-02 ENCOUNTER — Telehealth: Payer: Self-pay | Admitting: *Deleted

## 2024-02-02 ENCOUNTER — Ambulatory Visit: Payer: Self-pay | Admitting: Cardiology

## 2024-02-02 MED ORDER — NITROFURANTOIN MONOHYD MACRO 100 MG PO CAPS: 100.0000 mg | ORAL_CAPSULE | Freq: Two times a day (BID) | ORAL | 0 refills | Status: AC

## 2024-02-02 NOTE — Telephone Encounter (Signed)
 [name], sorry to bother but looks like Frankey is out today.  I saw this patient in clinic yesterday and she was asking if we could check a UA/culture on her since she was having dysuria.  Her UA does look like she has a UTI.  Could we start macrobid 100 mg BID on her for 5 days and have her f/u with PCP?  I tried calling patient but it is just going straight to voicemail [facility]

## 2024-02-02 NOTE — Telephone Encounter (Signed)
 Called left a detailed message on voicemail .  Urine was postive with UTI   Starting Marcobid

## 2024-02-03 DIAGNOSIS — I10 Essential (primary) hypertension: Secondary | ICD-10-CM | POA: Diagnosis not present

## 2024-02-03 DIAGNOSIS — I4891 Unspecified atrial fibrillation: Secondary | ICD-10-CM | POA: Diagnosis not present

## 2024-02-03 DIAGNOSIS — J449 Chronic obstructive pulmonary disease, unspecified: Secondary | ICD-10-CM | POA: Diagnosis not present

## 2024-02-03 LAB — UA/M W/RFLX CULTURE, ROUTINE
Bilirubin, UA: NEGATIVE
Glucose, UA: NEGATIVE
Ketones, UA: NEGATIVE
Nitrite, UA: NEGATIVE
Specific Gravity, UA: 1.029 (ref 1.005–1.030)
Urobilinogen, Ur: 0.2 mg/dL (ref 0.2–1.0)
pH, UA: 5.5 (ref 5.0–7.5)

## 2024-02-03 LAB — MICROSCOPIC EXAMINATION
Cast Type: NONE SEEN
WBC, UA: >30 /HPF — AB (ref 0–5)

## 2024-02-03 LAB — URINE CULTURE, REFLEX

## 2024-02-05 DIAGNOSIS — I10 Essential (primary) hypertension: Secondary | ICD-10-CM | POA: Diagnosis not present

## 2024-02-05 DIAGNOSIS — I4891 Unspecified atrial fibrillation: Secondary | ICD-10-CM | POA: Diagnosis not present

## 2024-02-05 DIAGNOSIS — F418 Other specified anxiety disorders: Secondary | ICD-10-CM | POA: Diagnosis not present

## 2024-02-05 DIAGNOSIS — E039 Hypothyroidism, unspecified: Secondary | ICD-10-CM | POA: Diagnosis not present

## 2024-02-05 DIAGNOSIS — J449 Chronic obstructive pulmonary disease, unspecified: Secondary | ICD-10-CM | POA: Diagnosis not present

## 2024-02-07 ENCOUNTER — Encounter: Payer: Self-pay | Admitting: Cardiology

## 2024-02-15 ENCOUNTER — Encounter: Payer: Self-pay | Admitting: Cardiology

## 2024-02-17 DIAGNOSIS — R8290 Unspecified abnormal findings in urine: Secondary | ICD-10-CM | POA: Diagnosis not present

## 2024-02-19 NOTE — Telephone Encounter (Signed)
 Stress test and echo we did looked good- did not suggest symptoms coming from the heart.  Would recommend PCP follow-up.  Valve disease on echo was mild to moderate, would not expect to be causing symptoms, and we can repeat echo as scheduled next year.

## 2024-03-04 DIAGNOSIS — J449 Chronic obstructive pulmonary disease, unspecified: Secondary | ICD-10-CM | POA: Diagnosis not present

## 2024-03-04 DIAGNOSIS — I10 Essential (primary) hypertension: Secondary | ICD-10-CM | POA: Diagnosis not present

## 2024-03-04 DIAGNOSIS — I4891 Unspecified atrial fibrillation: Secondary | ICD-10-CM | POA: Diagnosis not present

## 2024-03-06 DIAGNOSIS — J449 Chronic obstructive pulmonary disease, unspecified: Secondary | ICD-10-CM | POA: Diagnosis not present

## 2024-03-06 DIAGNOSIS — I4891 Unspecified atrial fibrillation: Secondary | ICD-10-CM | POA: Diagnosis not present

## 2024-03-06 DIAGNOSIS — F418 Other specified anxiety disorders: Secondary | ICD-10-CM | POA: Diagnosis not present

## 2024-03-06 DIAGNOSIS — E039 Hypothyroidism, unspecified: Secondary | ICD-10-CM | POA: Diagnosis not present

## 2024-03-06 DIAGNOSIS — I10 Essential (primary) hypertension: Secondary | ICD-10-CM | POA: Diagnosis not present

## 2024-03-07 ENCOUNTER — Ambulatory Visit
Admission: RE | Admit: 2024-03-07 | Discharge: 2024-03-07 | Disposition: A | Source: Ambulatory Visit | Attending: Family Medicine | Admitting: Family Medicine

## 2024-03-07 DIAGNOSIS — Z1231 Encounter for screening mammogram for malignant neoplasm of breast: Secondary | ICD-10-CM

## 2024-04-18 ENCOUNTER — Ambulatory Visit: Payer: Medicare Other

## 2024-04-18 DIAGNOSIS — I480 Paroxysmal atrial fibrillation: Secondary | ICD-10-CM | POA: Diagnosis not present

## 2024-04-19 LAB — CUP PACEART REMOTE DEVICE CHECK
Battery Remaining Longevity: 70 mo
Battery Voltage: 2.97 V
Brady Statistic AP VP Percent: 0.15 %
Brady Statistic AP VS Percent: 99.26 %
Brady Statistic AS VP Percent: 0.01 %
Brady Statistic AS VS Percent: 0.57 %
Brady Statistic RA Percent Paced: 99.41 %
Brady Statistic RV Percent Paced: 0.18 %
Date Time Interrogation Session: 20260112023213
Implantable Lead Connection Status: 753985
Implantable Lead Connection Status: 753985
Implantable Lead Implant Date: 20180914
Implantable Lead Implant Date: 20180914
Implantable Lead Location: 753859
Implantable Lead Location: 753860
Implantable Lead Model: 5076
Implantable Lead Model: 5076
Implantable Pulse Generator Implant Date: 20180914
Lead Channel Impedance Value: 323 Ohm
Lead Channel Impedance Value: 361 Ohm
Lead Channel Impedance Value: 399 Ohm
Lead Channel Impedance Value: 513 Ohm
Lead Channel Pacing Threshold Amplitude: 0.75 V
Lead Channel Pacing Threshold Amplitude: 1.125 V
Lead Channel Pacing Threshold Pulse Width: 0.4 ms
Lead Channel Pacing Threshold Pulse Width: 0.4 ms
Lead Channel Sensing Intrinsic Amplitude: 1 mV
Lead Channel Sensing Intrinsic Amplitude: 1 mV
Lead Channel Sensing Intrinsic Amplitude: 13 mV
Lead Channel Sensing Intrinsic Amplitude: 13 mV
Lead Channel Setting Pacing Amplitude: 1.5 V
Lead Channel Setting Pacing Amplitude: 2.5 V
Lead Channel Setting Pacing Pulse Width: 0.4 ms
Lead Channel Setting Sensing Sensitivity: 0.9 mV
Zone Setting Status: 755011
Zone Setting Status: 755011

## 2024-04-20 ENCOUNTER — Ambulatory Visit: Payer: Self-pay | Admitting: Cardiology

## 2024-04-20 NOTE — Progress Notes (Signed)
 Remote PPM Transmission

## 2024-04-26 ENCOUNTER — Encounter: Payer: Self-pay | Admitting: Pharmacist

## 2024-04-27 NOTE — Telephone Encounter (Signed)
 Duplicate message

## 2024-05-12 LAB — LIPID PANEL
Chol/HDL Ratio: 4.1 ratio (ref 0.0–4.4)
Cholesterol, Total: 170 mg/dL (ref 100–199)
HDL: 41 mg/dL
LDL Chol Calc (NIH): 94 mg/dL (ref 0–99)
Triglycerides: 207 mg/dL — ABNORMAL HIGH (ref 0–149)
VLDL Cholesterol Cal: 35 mg/dL (ref 5–40)

## 2024-06-02 ENCOUNTER — Ambulatory Visit (HOSPITAL_COMMUNITY): Admitting: Internal Medicine
